# Patient Record
Sex: Female | Born: 1985
Health system: Southern US, Community
[De-identification: ages and names within clinical notes are randomized; demographics above are authoritative.]

## PROBLEM LIST (undated history)

## (undated) DIAGNOSIS — R079 Chest pain, unspecified: Secondary | ICD-10-CM

## (undated) DIAGNOSIS — B1920 Unspecified viral hepatitis C without hepatic coma: Secondary | ICD-10-CM

## (undated) DIAGNOSIS — F32A Depression, unspecified: Secondary | ICD-10-CM

## (undated) DIAGNOSIS — F419 Anxiety disorder, unspecified: Secondary | ICD-10-CM

## (undated) DIAGNOSIS — D649 Anemia, unspecified: Secondary | ICD-10-CM

## (undated) DIAGNOSIS — F329 Major depressive disorder, single episode, unspecified: Secondary | ICD-10-CM

## (undated) DIAGNOSIS — I499 Cardiac arrhythmia, unspecified: Secondary | ICD-10-CM

## (undated) DIAGNOSIS — K8020 Calculus of gallbladder without cholecystitis without obstruction: Secondary | ICD-10-CM

## (undated) DIAGNOSIS — I10 Essential (primary) hypertension: Secondary | ICD-10-CM

## (undated) HISTORY — PX: OTHER SURGICAL HISTORY: SHX169

## (undated) HISTORY — PX: TUBAL LIGATION: SHX77

---

## 2015-10-26 ENCOUNTER — Emergency Department (HOSPITAL_BASED_OUTPATIENT_CLINIC_OR_DEPARTMENT_OTHER)
Admission: EM | Admit: 2015-10-26 | Discharge: 2015-10-26 | Disposition: A | Discharge status: Home / Self Care | Payer: PRIVATE HEALTH INSURANCE | Attending: Emergency Medicine | Admitting: Emergency Medicine

## 2015-10-26 ENCOUNTER — Encounter (HOSPITAL_BASED_OUTPATIENT_CLINIC_OR_DEPARTMENT_OTHER): Payer: Self-pay | Admitting: *Deleted

## 2015-10-26 DIAGNOSIS — I10 Essential (primary) hypertension: Secondary | ICD-10-CM | POA: Insufficient documentation

## 2015-10-26 DIAGNOSIS — F17210 Nicotine dependence, cigarettes, uncomplicated: Secondary | ICD-10-CM | POA: Insufficient documentation

## 2015-10-26 DIAGNOSIS — K529 Noninfective gastroenteritis and colitis, unspecified: Secondary | ICD-10-CM | POA: Diagnosis not present

## 2015-10-26 DIAGNOSIS — E860 Dehydration: Secondary | ICD-10-CM | POA: Insufficient documentation

## 2015-10-26 DIAGNOSIS — N939 Abnormal uterine and vaginal bleeding, unspecified: Secondary | ICD-10-CM | POA: Diagnosis not present

## 2015-10-26 DIAGNOSIS — Z79899 Other long term (current) drug therapy: Secondary | ICD-10-CM | POA: Diagnosis not present

## 2015-10-26 DIAGNOSIS — R112 Nausea with vomiting, unspecified: Secondary | ICD-10-CM | POA: Diagnosis present

## 2015-10-26 DIAGNOSIS — Z8619 Personal history of other infectious and parasitic diseases: Secondary | ICD-10-CM | POA: Insufficient documentation

## 2015-10-26 DIAGNOSIS — Z3202 Encounter for pregnancy test, result negative: Secondary | ICD-10-CM | POA: Diagnosis not present

## 2015-10-26 HISTORY — DX: Chest pain, unspecified: R07.9

## 2015-10-26 HISTORY — DX: Essential (primary) hypertension: I10

## 2015-10-26 HISTORY — DX: Cardiac arrhythmia, unspecified: I49.9

## 2015-10-26 HISTORY — DX: Unspecified viral hepatitis C without hepatic coma: B19.20

## 2015-10-26 LAB — CBC WITH DIFFERENTIAL/PLATELET
Basophils Absolute: 0 10*3/uL (ref 0.0–0.1)
Basophils Relative: 0 %
Eosinophils Absolute: 0.1 10*3/uL (ref 0.0–0.7)
Eosinophils Relative: 2 %
HEMATOCRIT: 34.1 % — AB (ref 36.0–46.0)
HEMOGLOBIN: 10.8 g/dL — AB (ref 12.0–15.0)
LYMPHS ABS: 2.1 10*3/uL (ref 0.7–4.0)
LYMPHS PCT: 26 %
MCH: 26.3 pg (ref 26.0–34.0)
MCHC: 31.7 g/dL (ref 30.0–36.0)
MCV: 83 fL (ref 78.0–100.0)
MONOS PCT: 7 %
Monocytes Absolute: 0.6 10*3/uL (ref 0.1–1.0)
NEUTROS ABS: 5.3 10*3/uL (ref 1.7–7.7)
NEUTROS PCT: 65 %
Platelets: 282 10*3/uL (ref 150–400)
RBC: 4.11 MIL/uL (ref 3.87–5.11)
RDW: 14.6 % (ref 11.5–15.5)
WBC: 8.1 10*3/uL (ref 4.0–10.5)

## 2015-10-26 LAB — URINALYSIS, ROUTINE W REFLEX MICROSCOPIC
Bilirubin Urine: NEGATIVE
GLUCOSE, UA: NEGATIVE mg/dL
KETONES UR: NEGATIVE mg/dL
LEUKOCYTES UA: NEGATIVE
NITRITE: NEGATIVE
PH: 5.5 (ref 5.0–8.0)
Protein, ur: NEGATIVE mg/dL
Specific Gravity, Urine: 1.02 (ref 1.005–1.030)

## 2015-10-26 LAB — COMPREHENSIVE METABOLIC PANEL
ALBUMIN: 3.8 g/dL (ref 3.5–5.0)
ALK PHOS: 46 U/L (ref 38–126)
ALT: 10 U/L — ABNORMAL LOW (ref 14–54)
ANION GAP: 7 (ref 5–15)
AST: 13 U/L — ABNORMAL LOW (ref 15–41)
BUN: 20 mg/dL (ref 6–20)
CO2: 24 mmol/L (ref 22–32)
Calcium: 8.6 mg/dL — ABNORMAL LOW (ref 8.9–10.3)
Chloride: 109 mmol/L (ref 101–111)
Creatinine, Ser: 0.9 mg/dL (ref 0.44–1.00)
GFR calc Af Amer: >60 mL/min (ref >60)
GFR calc non Af Amer: >60 mL/min (ref >60)
GLUCOSE: 94 mg/dL (ref 65–99)
POTASSIUM: 3.7 mmol/L (ref 3.5–5.1)
SODIUM: 140 mmol/L (ref 135–145)
Total Bilirubin: 0.4 mg/dL (ref 0.3–1.2)
Total Protein: 6.8 g/dL (ref 6.5–8.1)

## 2015-10-26 LAB — URINE MICROSCOPIC-ADD ON

## 2015-10-26 LAB — LIPASE, BLOOD: Lipase: 37 U/L (ref 11–51)

## 2015-10-26 LAB — PREGNANCY, URINE: Preg Test, Ur: NEGATIVE

## 2015-10-26 MED ORDER — SODIUM CHLORIDE 0.9 % IV BOLUS (SEPSIS)
1000.0000 mL | Freq: Once | INTRAVENOUS | Status: AC
  Administered 2015-10-26: 1000 mL via INTRAVENOUS

## 2015-10-26 MED ORDER — PROMETHAZINE HCL 25 MG PO TABS: 25.0000 mg | ORAL_TABLET | Freq: Four times a day (QID) | ORAL | Status: DC | PRN

## 2015-10-26 MED ORDER — SODIUM CHLORIDE 0.9 % IV SOLN: INTRAVENOUS | Status: DC

## 2015-10-26 MED ORDER — LOPERAMIDE HCL 2 MG PO TABS: 2.0000 mg | ORAL_TABLET | Freq: Four times a day (QID) | ORAL | Status: DC | PRN

## 2015-10-26 NOTE — Discharge Instructions (Signed)
Take the Imodium right ear for the diarrhea. Take Phenergan as needed for nausea and vomiting return if not improving in one to 2 days. Return for any new or worse symptoms.

## 2015-10-26 NOTE — ED Notes (Signed)
Patient and sister, states patient passed out 3 times this morning.  States she has a 24 hour history of nausea, vomiting and diarrhea.

## 2015-10-26 NOTE — ED Notes (Addendum)
Hypertension. States she ran out of medication 3 weeks ago. Has not been taking her BP but assumed it was elevated due to dizziness. Her friend states she must be dehydrated because she has been having fainting spells all day at last a few seconds. Pt appears anxious. She is ambulatory and laughing at times with her friend.

## 2015-10-26 NOTE — ED Provider Notes (Signed)
CSN: [id number]     Arrival date & time 10/26/15  1121 History   First MD Initiated Contact with Patient 10/26/15 1138     Chief Complaint  Patient presents with  . Hypertension     (Consider location/radiation/quality/duration/timing/severity/associated sxs/prior Treatment) Patient is a 30 y.o. female presenting with hypertension. The history is provided by the patient.  Hypertension Pertinent negatives include no chest pain, no abdominal pain, no headaches and no shortness of breath.  Patient recently moved here from [place]. Patient states that she's been having nausea and diarrhea rare vomiting several near syncopal episodes starting yesterday. Patient's had 8-12 watery bowel movements no blood in them. In addition patient states that her high blood pressure medicine for 3 weeks. Patient denies fever. Patient feels as if she is dehydrated.  Past Medical History  Diagnosis Date  . Hypertension   . Hepatitis C   . Irregular heart beat   . Chest pain    Past Surgical History  Procedure Laterality Date  . C sections    . Tubal ligation     No family history on file. Social History  Substance Use Topics  . Smoking status: Current Every Day Smoker -- 1.00 packs/day    Types: Cigarettes  . Smokeless tobacco: None  . Alcohol Use: No   OB History    No data available     Review of Systems  Constitutional: Negative for fever.  HENT: Negative for congestion.   Eyes: Negative for redness.  Respiratory: Negative for shortness of breath.   Cardiovascular: Negative for chest pain.  Gastrointestinal: Positive for nausea and diarrhea. Negative for vomiting and abdominal pain.  Genitourinary: Positive for vaginal bleeding. Negative for dysuria.  Musculoskeletal: Negative for back pain.  Skin: Negative for rash.  Neurological: Negative for headaches.  Hematological: Does not bruise/bleed easily.  Psychiatric/Behavioral: Negative for confusion.      Allergies  Review of  patient's allergies indicates no known allergies.  Home Medications   Prior to Admission medications   Medication Sig Start Date End Date Taking? Authorizing Provider  AMLODIPINE BESYLATE PO Take 5 mg by mouth 1 day or 1 dose.   Yes Historical Provider, MD  busPIRone (BUSPAR) 5 MG tablet Take 5 mg by mouth 3 (three) times daily.   Yes Historical Provider, MD  lisinopril (PRINIVIL,ZESTRIL) 40 MG tablet Take 40 mg by mouth daily.   Yes Historical Provider, MD  metoprolol (LOPRESSOR) 50 MG tablet Take 50 mg by mouth 2 (two) times daily.   Yes Historical Provider, MD  sertraline (ZOLOFT) 100 MG tablet Take 100 mg by mouth daily.   Yes Historical Provider, MD  loperamide (IMODIUM A-D) 2 MG tablet Take 1 tablet (2 mg total) by mouth 4 (four) times daily as needed for diarrhea or loose stools. 10/26/15   Vanetta Mulders, MD  promethazine (PHENERGAN) 25 MG tablet Take 1 tablet (25 mg total) by mouth every 6 (six) hours as needed for nausea or vomiting. 10/26/15   Vanetta Mulders, MD   BP 125/78 mmHg  Pulse 84  Temp(Src) 98.3 F (36.8 C) (Oral)  Resp 16  Ht 5' 1.5" (1.562 m)  Wt 96.163 kg  BMI 39.41 kg/m2  SpO2 99%  LMP 10/25/2015 Physical Exam  Constitutional: She is oriented to person, place, and time. She appears well-developed and well-nourished. No distress.  HENT:  Head: Normocephalic and atraumatic.  Membranes dry  Eyes: Conjunctivae and EOM are normal. Pupils are equal, round, and reactive to light.  Neck: Normal  range of motion. Neck supple.  Cardiovascular: Normal rate, regular rhythm and normal heart sounds.   No murmur heard. Pulmonary/Chest: Effort normal and breath sounds normal. No respiratory distress.  Abdominal: Soft. Bowel sounds are normal. There is no tenderness.  Musculoskeletal: Normal range of motion. She exhibits no edema.  Neurological: She is alert and oriented to person, place, and time. No cranial nerve deficit. She exhibits normal muscle tone. Coordination  normal.  Skin: Skin is warm. No rash noted.  Nursing note and vitals reviewed.   ED Course  Procedures (including critical care time) Labs Review Labs Reviewed  COMPREHENSIVE METABOLIC PANEL - Abnormal; Notable for the following:    Calcium 8.6 (*)    AST 13 (*)    ALT 10 (*)    All other components within normal limits  CBC WITH DIFFERENTIAL/PLATELET - Abnormal; Notable for the following:    Hemoglobin 10.8 (*)    HCT 34.1 (*)    All other components within normal limits  URINALYSIS, ROUTINE W REFLEX MICROSCOPIC (NOT AT [facility]) - Abnormal; Notable for the following:    Hgb urine dipstick LARGE (*)    All other components within normal limits  URINE MICROSCOPIC-ADD ON - Abnormal; Notable for the following:    Squamous Epithelial / LPF 0-5 (*)    Bacteria, UA RARE (*)    All other components within normal limits  URINE CULTURE  LIPASE, BLOOD  PREGNANCY, URINE   Results for orders placed or performed during the hospital encounter of 10/26/15  Comprehensive metabolic panel  Result Value Ref Range   Sodium 140 135 - 145 mmol/L   Potassium 3.7 3.5 - 5.1 mmol/L   Chloride 109 101 - 111 mmol/L   CO2 24 22 - 32 mmol/L   Glucose, Bld 94 65 - 99 mg/dL   BUN 20 6 - 20 mg/dL   Creatinine, Ser 1.610.90 0.44 - 1.00 mg/dL   Calcium 8.6 (L) 8.9 - 10.3 mg/dL   Total Protein 6.8 6.5 - 8.1 g/dL   Albumin 3.8 3.5 - 5.0 g/dL   AST 13 (L) 15 - 41 U/L   ALT 10 (L) 14 - 54 U/L   Alkaline Phosphatase 46 38 - 126 U/L   Total Bilirubin 0.4 0.3 - 1.2 mg/dL   GFR calc non Af Amer >60 >60 mL/min   GFR calc Af Amer >60 >60 mL/min   Anion gap 7 5 - 15  Lipase, blood  Result Value Ref Range   Lipase 37 11 - 51 U/L  CBC with Differential/Platelet  Result Value Ref Range   WBC 8.1 4.0 - 10.5 K/uL   RBC 4.11 3.87 - 5.11 MIL/uL   Hemoglobin 10.8 (L) 12.0 - 15.0 g/dL   HCT 09.634.1 (L) 04.536.0 - 40.946.0 %   MCV 83.0 78.0 - 100.0 fL   MCH 26.3 26.0 - 34.0 pg   MCHC 31.7 30.0 - 36.0 g/dL   RDW 81.114.6 91.411.5 - 78.215.5  %   Platelets 282 150 - 400 K/uL   Neutrophils Relative % 65 %   Neutro Abs 5.3 1.7 - 7.7 K/uL   Lymphocytes Relative 26 %   Lymphs Abs 2.1 0.7 - 4.0 K/uL   Monocytes Relative 7 %   Monocytes Absolute 0.6 0.1 - 1.0 K/uL   Eosinophils Relative 2 %   Eosinophils Absolute 0.1 0.0 - 0.7 K/uL   Basophils Relative 0 %   Basophils Absolute 0.0 0.0 - 0.1 K/uL  Urinalysis, Routine w reflex microscopic (not  at [facility])  Result Value Ref Range   Color, Urine YELLOW YELLOW   APPearance CLEAR CLEAR   Specific Gravity, Urine 1.020 1.005 - 1.030   pH 5.5 5.0 - 8.0   Glucose, UA NEGATIVE NEGATIVE mg/dL   Hgb urine dipstick LARGE (A) NEGATIVE   Bilirubin Urine NEGATIVE NEGATIVE   Ketones, ur NEGATIVE NEGATIVE mg/dL   Protein, ur NEGATIVE NEGATIVE mg/dL   Nitrite NEGATIVE NEGATIVE   Leukocytes, UA NEGATIVE NEGATIVE  Pregnancy, urine  Result Value Ref Range   Preg Test, Ur NEGATIVE NEGATIVE  Urine microscopic-add on  Result Value Ref Range   Squamous Epithelial / LPF 0-5 (A) NONE SEEN   WBC, UA 0-5 0 - 5 WBC/hpf   RBC / HPF TOO NUMEROUS TO COUNT 0 - 5 RBC/hpf   Bacteria, UA RARE (A) NONE SEEN     Imaging Review No results found. I have personally reviewed and evaluated these images and lab results as part of my medical decision-making.   EKG Interpretation   Date/Time:  Friday October 26 2015 11:46:51 EST Ventricular Rate:  85 PR Interval:  116 QRS Duration: 98 QT Interval:  364 QTC Calculation: 433 R Axis:   47 Text Interpretation:  Normal sinus rhythm Normal ECG Confirmed by  Remo Kirschenmann  MD, Kimon Loewen ([id number]) on 10/26/2015 12:07:35 PM      MDM   Final diagnoses:  Gastroenteritis  Dehydration    The patient's labs without significant abnormalities. There is a little bit of mild elevation in some of the liver function tests patient Reginia Naas has a history of hepatitis C nothing is consistent with acute hepatitis. Mild anemia with hemoglobin 10.8. Otherwise test are negative. Patient with  the RBCs in the urine the patient on her menstrual cycle. No evidence of any bacteria or white blood cells in the urine. Will treat as a gastroenteritis treat nausea with Phenergan and Imodium right ear. Patient nontoxic no acute distress. Patient feels better with the IV hydration. Patient the received 2 L of fluid.  In addition for patients blood pressure blood pressures are well controlled here with systolics around 125 normal diastolics. Patient does not need blood pressure medication at this time based on these numbers. Information provided to help her get a primary care doctor locally for follow-up.  Vanetta Mulders, MD 10/26/15 1527

## 2015-10-28 LAB — URINE CULTURE

## 2016-10-12 ENCOUNTER — Emergency Department (HOSPITAL_COMMUNITY)
Admission: EM | Admit: 2016-10-12 | Discharge: 2016-10-12 | Disposition: A | Discharge status: Home / Self Care | Payer: Medicaid - Out of State | Attending: Emergency Medicine | Admitting: Emergency Medicine

## 2016-10-12 ENCOUNTER — Emergency Department (HOSPITAL_COMMUNITY): Payer: Medicaid - Out of State

## 2016-10-12 ENCOUNTER — Encounter (HOSPITAL_COMMUNITY): Payer: Self-pay

## 2016-10-12 DIAGNOSIS — R1013 Epigastric pain: Secondary | ICD-10-CM | POA: Insufficient documentation

## 2016-10-12 DIAGNOSIS — F17210 Nicotine dependence, cigarettes, uncomplicated: Secondary | ICD-10-CM | POA: Diagnosis not present

## 2016-10-12 DIAGNOSIS — I10 Essential (primary) hypertension: Secondary | ICD-10-CM | POA: Insufficient documentation

## 2016-10-12 DIAGNOSIS — Z79899 Other long term (current) drug therapy: Secondary | ICD-10-CM | POA: Insufficient documentation

## 2016-10-12 DIAGNOSIS — R1010 Upper abdominal pain, unspecified: Secondary | ICD-10-CM | POA: Diagnosis not present

## 2016-10-12 LAB — ETHANOL: Alcohol, Ethyl (B): <5 mg/dL

## 2016-10-12 LAB — I-STAT BETA HCG BLOOD, ED (MC, WL, AP ONLY): I-stat hCG, quantitative: <5 m[IU]/mL

## 2016-10-12 LAB — COMPREHENSIVE METABOLIC PANEL WITH GFR
ALT: 17 U/L (ref 14–54)
AST: 20 U/L (ref 15–41)
Albumin: 4.2 g/dL (ref 3.5–5.0)
Alkaline Phosphatase: 50 U/L (ref 38–126)
Anion gap: 7 (ref 5–15)
BUN: 16 mg/dL (ref 6–20)
CO2: 24 mmol/L (ref 22–32)
Calcium: 9.2 mg/dL (ref 8.9–10.3)
Chloride: 109 mmol/L (ref 101–111)
Creatinine, Ser: 1.05 mg/dL — ABNORMAL HIGH (ref 0.44–1.00)
GFR calc Af Amer: >60 mL/min
GFR calc non Af Amer: >60 mL/min
Glucose, Bld: 89 mg/dL (ref 65–99)
Potassium: 4.1 mmol/L (ref 3.5–5.1)
Sodium: 140 mmol/L (ref 135–145)
Total Bilirubin: 0.8 mg/dL (ref 0.3–1.2)
Total Protein: 7.4 g/dL (ref 6.5–8.1)

## 2016-10-12 LAB — CBC
HCT: 38.4 % (ref 36.0–46.0)
Hemoglobin: 12.5 g/dL (ref 12.0–15.0)
MCH: 27 pg (ref 26.0–34.0)
MCHC: 32.6 g/dL (ref 30.0–36.0)
MCV: 82.9 fL (ref 78.0–100.0)
Platelets: 265 10*3/uL (ref 150–400)
RBC: 4.63 MIL/uL (ref 3.87–5.11)
RDW: 15.8 % — ABNORMAL HIGH (ref 11.5–15.5)
WBC: 6.7 10*3/uL (ref 4.0–10.5)

## 2016-10-12 LAB — URINALYSIS, ROUTINE W REFLEX MICROSCOPIC
Bilirubin Urine: NEGATIVE
GLUCOSE, UA: NEGATIVE mg/dL
Hgb urine dipstick: NEGATIVE
KETONES UR: NEGATIVE mg/dL
Leukocytes, UA: NEGATIVE
Nitrite: NEGATIVE
PROTEIN: NEGATIVE mg/dL
Specific Gravity, Urine: 1.029 (ref 1.005–1.030)
pH: 5 (ref 5.0–8.0)

## 2016-10-12 LAB — LIPASE, BLOOD: Lipase: 39 U/L (ref 11–51)

## 2016-10-12 MED ORDER — OMEPRAZOLE 20 MG PO CPDR: 20.0000 mg | DELAYED_RELEASE_CAPSULE | Freq: Every day | ORAL | 0 refills | Status: DC

## 2016-10-12 MED ORDER — PROMETHAZINE HCL 25 MG PO TABS: 25.0000 mg | ORAL_TABLET | Freq: Four times a day (QID) | ORAL | 0 refills | Status: DC | PRN

## 2016-10-12 MED ORDER — PANTOPRAZOLE SODIUM 40 MG IV SOLR
40.0000 mg | Freq: Once | INTRAVENOUS | Status: AC
  Administered 2016-10-12: 40 mg via INTRAVENOUS
  Filled 2016-10-12: qty 40

## 2016-10-12 MED ORDER — CHLORDIAZEPOXIDE HCL 25 MG PO CAPS: ORAL_CAPSULE | ORAL | 0 refills | Status: DC

## 2016-10-12 MED ORDER — SODIUM CHLORIDE 0.9 % IV SOLN
1000.0000 mL | INTRAVENOUS | Status: DC
  Administered 2016-10-12: 1000 mL via INTRAVENOUS

## 2016-10-12 MED ORDER — SODIUM CHLORIDE 0.9 % IV SOLN
1000.0000 mL | Freq: Once | INTRAVENOUS | Status: AC
  Administered 2016-10-12: 1000 mL via INTRAVENOUS

## 2016-10-12 MED ORDER — ONDANSETRON HCL 4 MG/2ML IJ SOLN
4.0000 mg | Freq: Once | INTRAMUSCULAR | Status: AC
  Administered 2016-10-12: 4 mg via INTRAVENOUS
  Filled 2016-10-12: qty 2

## 2016-10-12 MED ORDER — MORPHINE SULFATE (PF) 4 MG/ML IV SOLN
6.0000 mg | Freq: Once | INTRAVENOUS | Status: AC
  Administered 2016-10-12: 6 mg via INTRAVENOUS
  Filled 2016-10-12: qty 2

## 2016-10-12 NOTE — ED Provider Notes (Signed)
[facility]-EMERGENCY DEPT Provider Note   CSN: [id number] Arrival date & time: 10/12/16  1210     History   Chief Complaint Chief Complaint  Patient presents with  . Abdominal Pain  . Emesis    HPI Shirley Murphy is a 31 y.o. female.  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  She has a history of hepatitis C.  She presents to the emergency department complaining of intermittent epigastric and right upper quadrant pain over the past week.  It waxes and wanes.  She feels like it worsens with food.  She continues to drink alcohol.  No prior history of pancreatitis.  No fevers or chills.  No hematemesis.  No melena or hematochezia.  No diarrhea.  Pain is moderate in severity.   Past Medical History:  Diagnosis Date  . Chest pain   . Hepatitis C   . Hypertension   . Irregular heart beat     There are no active problems to display for this patient.   Past Surgical History:  Procedure Laterality Date  . c sections    . TUBAL LIGATION      OB History    No data available       Home Medications    Prior to Admission medications   Medication Sig Start Date End Date Taking? Authorizing Provider  AMLODIPINE BESYLATE PO Take 5 mg by mouth 1 day or 1 dose.    Historical Provider, MD  busPIRone (BUSPAR) 5 MG tablet Take 5 mg by mouth 3 (three) times daily.    Historical Provider, MD  lisinopril (PRINIVIL,ZESTRIL) 40 MG tablet Take 40 mg by mouth daily.    Historical Provider, MD  loperamide (IMODIUM A-D) 2 MG tablet Take 1 tablet (2 mg total) by mouth 4 (four) times daily as needed for diarrhea or loose stools. 10/26/15   Vanetta MuldersScott Zackowski, MD  metoprolol (LOPRESSOR) 50 MG tablet Take 50 mg by mouth 2 (two) times daily.    Historical Provider, MD  promethazine (PHENERGAN) 25 MG tablet Take 1 tablet (25 mg total) by mouth every 6 (six) hours as needed for nausea or vomiting. 10/26/15   Vanetta MuldersScott Zackowski, MD  sertraline (ZOLOFT) 100 MG tablet Take 100 mg by mouth  daily.    Historical Provider, MD    Family History History reviewed. No pertinent family history.  Social History Social History  Substance Use Topics  . Smoking status: Current Every Day Smoker    Packs/day: 1.00    Types: Cigarettes  . Smokeless tobacco: Never Used  . Alcohol use No     Allergies   Patient has no known allergies.   Review of Systems Review of Systems  All other systems reviewed and are negative.    Physical Exam Updated Vital Signs BP (!) 151/102   Pulse 82   Temp 98.1 F (36.7 C) (Oral)   Resp 16   Ht 5' 1.5" (1.562 m)   Wt 235 lb 3 oz (106.7 kg)   LMP 10/07/2016   SpO2 99%   BMI 43.72 kg/m   Physical Exam  Constitutional: She is oriented to person, place, and time. She appears well-developed and well-nourished. No distress.  HENT:  Head: Normocephalic and atraumatic.  Eyes: EOM are normal.  Neck: Normal range of motion.  Cardiovascular: Normal rate, regular rhythm and normal heart sounds.   Pulmonary/Chest: Effort normal and breath sounds normal.  Abdominal: Soft. She exhibits no distension. There is no tenderness.  Musculoskeletal: Normal range  of motion.  Neurological: She is alert and oriented to person, place, and time.  Skin: Skin is warm and dry.  Psychiatric: She has a normal mood and affect. Judgment normal.  Nursing note and vitals reviewed.    ED Treatments / Results  Labs (all labs ordered are listed, but only abnormal results are displayed) Labs Reviewed  COMPREHENSIVE METABOLIC PANEL - Abnormal; Notable for the following:       Result Value   Creatinine, Ser 1.05 (*)    All other components within normal limits  CBC - Abnormal; Notable for the following:    RDW 15.8 (*)    All other components within normal limits  LIPASE, BLOOD  URINALYSIS, ROUTINE W REFLEX MICROSCOPIC  ETHANOL  I-STAT BETA HCG BLOOD, ED ([facility], [facility], [facility] ONLY)    EKG  EKG Interpretation None       Radiology US Abdomen Limited  Ruq  Result Date: 10/12/2016 CLINICAL DATA:  Right upper quadrant pain for 5 days. EXAM: US ABDOMEN LIMITED - RIGHT UPPER QUADRANT COMPARISON:  None. FINDINGS: Gallbladder: No gallstones or wall thickening visualized. No sonographic Murphy sign noted by sonographer. Common bile duct: Diameter: 2.6 mm Liver: No focal lesion identified. Within normal limits in parenchymal echogenicity. IMPRESSION: Normal right upper quadrant ultrasound. Electronically Signed   By: Amie Portland M.D.   On: 10/12/2016 16:26    Procedures Procedures (including critical care time)  Medications Ordered in ED Medications  0.9 %  sodium chloride infusion (1,000 mLs Intravenous New Bag/Given 10/12/16 1628)    Followed by  0.9 %  sodium chloride infusion (1,000 mLs Intravenous New Bag/Given 10/12/16 1632)  pantoprazole (PROTONIX) injection 40 mg (not administered)  morphine 4 MG/ML injection 6 mg (6 mg Intravenous Given 10/12/16 1628)  ondansetron (ZOFRAN) injection 4 mg (4 mg Intravenous Given 10/12/16 1628)     Initial Impression / Assessment and Plan / ED Course  I have reviewed the triage vital signs and the nursing notes.  Pertinent labs & imaging results that were available during my care of the patient were reviewed by me and considered in my medical decision making (see chart for details).     7:13 PM Patient's pain is improving.  No indication for CT imaging at this time.  Ultrasound negative.  This likely represents gastritis associated from ongoing alcohol use.  Recommend abstaining from alcohol use.  Outpatient GI follow-up.  Outpatient primary care follow-up.  Home with Phenergan and Prilosec.  Home with a Librium taper to help her with her alcohol abuse issue.  She is focused on quitting alcohol.  Outpatient resources given.  She understands to return to the ER for new or worsening symptoms.  No fever.  Vitals stable.  Labs without significant abnormality  Final Clinical Impressions(s) / ED Diagnoses    Final diagnoses:  Upper abdominal pain    New Prescriptions New Prescriptions   No medications on file     Azalia Bilis, MD 10/12/16 [phone]

## 2016-10-12 NOTE — ED Triage Notes (Signed)
Pt with abdominal pain, emesis, flank pain x 1 week.  No difficulty urinating.  Does not recall fever.

## 2016-10-12 NOTE — ED Notes (Signed)
Pt made aware of need for urine specimen 

## 2016-10-15 ENCOUNTER — Encounter (HOSPITAL_COMMUNITY): Payer: Self-pay | Admitting: *Deleted

## 2016-10-15 ENCOUNTER — Observation Stay (HOSPITAL_COMMUNITY)
Admission: EM | Admit: 2016-10-15 | Discharge: 2016-10-17 | Disposition: A | Discharge status: Other Acute Inpatient Hospital | Payer: Medicaid - Out of State | Attending: Internal Medicine | Admitting: Internal Medicine

## 2016-10-15 DIAGNOSIS — F1094 Alcohol use, unspecified with alcohol-induced mood disorder: Secondary | ICD-10-CM | POA: Diagnosis not present

## 2016-10-15 DIAGNOSIS — Z791 Long term (current) use of non-steroidal anti-inflammatories (NSAID): Secondary | ICD-10-CM | POA: Insufficient documentation

## 2016-10-15 DIAGNOSIS — R45851 Suicidal ideations: Secondary | ICD-10-CM

## 2016-10-15 DIAGNOSIS — R1013 Epigastric pain: Secondary | ICD-10-CM | POA: Insufficient documentation

## 2016-10-15 DIAGNOSIS — F1410 Cocaine abuse, uncomplicated: Secondary | ICD-10-CM | POA: Diagnosis present

## 2016-10-15 DIAGNOSIS — F10220 Alcohol dependence with intoxication, uncomplicated: Secondary | ICD-10-CM | POA: Diagnosis not present

## 2016-10-15 DIAGNOSIS — Y907 Blood alcohol level of 200-239 mg/100 ml: Secondary | ICD-10-CM | POA: Insufficient documentation

## 2016-10-15 DIAGNOSIS — F14120 Cocaine abuse with intoxication, uncomplicated: Secondary | ICD-10-CM | POA: Insufficient documentation

## 2016-10-15 DIAGNOSIS — T1491XA Suicide attempt, initial encounter: Secondary | ICD-10-CM | POA: Diagnosis not present

## 2016-10-15 DIAGNOSIS — I10 Essential (primary) hypertension: Secondary | ICD-10-CM

## 2016-10-15 DIAGNOSIS — F1414 Cocaine abuse with cocaine-induced mood disorder: Secondary | ICD-10-CM | POA: Diagnosis not present

## 2016-10-15 DIAGNOSIS — T510X2A Toxic effect of ethanol, intentional self-harm, initial encounter: Secondary | ICD-10-CM | POA: Insufficient documentation

## 2016-10-15 DIAGNOSIS — Z8619 Personal history of other infectious and parasitic diseases: Secondary | ICD-10-CM | POA: Diagnosis not present

## 2016-10-15 DIAGNOSIS — T461X2A Poisoning by calcium-channel blockers, intentional self-harm, initial encounter: Secondary | ICD-10-CM | POA: Diagnosis not present

## 2016-10-15 DIAGNOSIS — T405X2A Poisoning by cocaine, intentional self-harm, initial encounter: Secondary | ICD-10-CM | POA: Diagnosis not present

## 2016-10-15 DIAGNOSIS — F419 Anxiety disorder, unspecified: Secondary | ICD-10-CM | POA: Diagnosis not present

## 2016-10-15 DIAGNOSIS — T450X2A Poisoning by antiallergic and antiemetic drugs, intentional self-harm, initial encounter: Secondary | ICD-10-CM

## 2016-10-15 DIAGNOSIS — F17210 Nicotine dependence, cigarettes, uncomplicated: Secondary | ICD-10-CM | POA: Diagnosis not present

## 2016-10-15 DIAGNOSIS — F329 Major depressive disorder, single episode, unspecified: Secondary | ICD-10-CM | POA: Insufficient documentation

## 2016-10-15 DIAGNOSIS — F10920 Alcohol use, unspecified with intoxication, uncomplicated: Secondary | ICD-10-CM | POA: Diagnosis present

## 2016-10-15 DIAGNOSIS — F1994 Other psychoactive substance use, unspecified with psychoactive substance-induced mood disorder: Secondary | ICD-10-CM | POA: Diagnosis not present

## 2016-10-15 DIAGNOSIS — T5192XA Toxic effect of unspecified alcohol, intentional self-harm, initial encounter: Secondary | ICD-10-CM

## 2016-10-15 DIAGNOSIS — Z79899 Other long term (current) drug therapy: Secondary | ICD-10-CM

## 2016-10-15 DIAGNOSIS — I499 Cardiac arrhythmia, unspecified: Secondary | ICD-10-CM | POA: Diagnosis not present

## 2016-10-15 LAB — CBC
HEMATOCRIT: 36.6 % (ref 36.0–46.0)
HEMOGLOBIN: 12 g/dL (ref 12.0–15.0)
MCH: 26.7 pg (ref 26.0–34.0)
MCHC: 32.8 g/dL (ref 30.0–36.0)
MCV: 81.5 fL (ref 78.0–100.0)
Platelets: 274 10*3/uL (ref 150–400)
RBC: 4.49 MIL/uL (ref 3.87–5.11)
RDW: 16 % — ABNORMAL HIGH (ref 11.5–15.5)
WBC: 6.4 10*3/uL (ref 4.0–10.5)

## 2016-10-15 LAB — GLUCOSE, CAPILLARY: Glucose-Capillary: 92 mg/dL (ref 65–99)

## 2016-10-15 LAB — COMPREHENSIVE METABOLIC PANEL
ALBUMIN: 4.1 g/dL (ref 3.5–5.0)
ALK PHOS: 50 U/L (ref 38–126)
ALK PHOS: 55 U/L (ref 38–126)
ALT: 16 U/L (ref 14–54)
ALT: 17 U/L (ref 14–54)
ANION GAP: 8 (ref 5–15)
AST: 19 U/L (ref 15–41)
AST: 19 U/L (ref 15–41)
Albumin: 4.2 g/dL (ref 3.5–5.0)
Anion gap: 9 (ref 5–15)
BILIRUBIN TOTAL: 0.6 mg/dL (ref 0.3–1.2)
BILIRUBIN TOTAL: 0.5 mg/dL (ref 0.3–1.2)
BUN: 16 mg/dL (ref 6–20)
BUN: 14 mg/dL (ref 6–20)
CALCIUM: 9.1 mg/dL (ref 8.9–10.3)
CALCIUM: 9.7 mg/dL (ref 8.9–10.3)
CO2: 22 mmol/L (ref 22–32)
CO2: 22 mmol/L (ref 22–32)
CREATININE: 0.88 mg/dL (ref 0.44–1.00)
Chloride: 111 mmol/L (ref 101–111)
Chloride: 107 mmol/L (ref 101–111)
Creatinine, Ser: 0.85 mg/dL (ref 0.44–1.00)
GFR calc non Af Amer: >60 mL/min (ref >60)
GFR calc non Af Amer: >60 mL/min (ref >60)
GLUCOSE: 79 mg/dL (ref 65–99)
Glucose, Bld: 80 mg/dL (ref 65–99)
POTASSIUM: 4.2 mmol/L (ref 3.5–5.1)
Potassium: 3.7 mmol/L (ref 3.5–5.1)
SODIUM: 142 mmol/L (ref 135–145)
SODIUM: 137 mmol/L (ref 135–145)
TOTAL PROTEIN: 7.5 g/dL (ref 6.5–8.1)
Total Protein: 7.3 g/dL (ref 6.5–8.1)

## 2016-10-15 LAB — RAPID URINE DRUG SCREEN, HOSP PERFORMED
Amphetamines: NOT DETECTED
BARBITURATES: NOT DETECTED
Benzodiazepines: NOT DETECTED
Cocaine: POSITIVE — AB
OPIATES: NOT DETECTED
TETRAHYDROCANNABINOL: NOT DETECTED

## 2016-10-15 LAB — SALICYLATE LEVEL

## 2016-10-15 LAB — ACETAMINOPHEN LEVEL: Acetaminophen (Tylenol), Serum: <10 ug/mL — ABNORMAL LOW (ref 10–30)

## 2016-10-15 LAB — LIPASE, BLOOD: Lipase: 23 U/L (ref 11–51)

## 2016-10-15 LAB — TROPONIN I: Troponin I: <0.03 ng/mL (ref <0.03)

## 2016-10-15 LAB — MAGNESIUM: Magnesium: 2.3 mg/dL (ref 1.7–2.4)

## 2016-10-15 LAB — IRON AND TIBC
Iron: 14 ug/dL — ABNORMAL LOW (ref 28–170)
SATURATION RATIOS: 3 % — AB (ref 10.4–31.8)
TIBC: 498 ug/dL — ABNORMAL HIGH (ref 250–450)
UIBC: 484 ug/dL

## 2016-10-15 LAB — ETHANOL: Alcohol, Ethyl (B): 236 mg/dL — ABNORMAL HIGH (ref <5)

## 2016-10-15 MED ORDER — ONDANSETRON HCL 4 MG PO TABS: 4.0000 mg | ORAL_TABLET | Freq: Four times a day (QID) | ORAL | Status: DC | PRN

## 2016-10-15 MED ORDER — INFLUENZA VAC SPLIT QUAD 0.5 ML IM SUSY
0.5000 mL | PREFILLED_SYRINGE | INTRAMUSCULAR | Status: AC
  Administered 2016-10-16: 0.5 mL via INTRAMUSCULAR
  Filled 2016-10-15: qty 0.5

## 2016-10-15 MED ORDER — ACETAMINOPHEN 650 MG RE SUPP: 650.0000 mg | Freq: Four times a day (QID) | RECTAL | Status: DC | PRN

## 2016-10-15 MED ORDER — LORAZEPAM 1 MG PO TABS: 1.0000 mg | ORAL_TABLET | Freq: Four times a day (QID) | ORAL | Status: DC | PRN

## 2016-10-15 MED ORDER — ACETAMINOPHEN 325 MG PO TABS
650.0000 mg | ORAL_TABLET | Freq: Four times a day (QID) | ORAL | Status: DC | PRN
  Administered 2016-10-15: 650 mg via ORAL
  Filled 2016-10-15: qty 2

## 2016-10-15 MED ORDER — ADULT MULTIVITAMIN W/MINERALS CH
1.0000 | ORAL_TABLET | Freq: Every day | ORAL | Status: DC
  Administered 2016-10-15 – 2016-10-17 (×3): 1 via ORAL
  Filled 2016-10-15 (×3): qty 1

## 2016-10-15 MED ORDER — NICOTINE 21 MG/24HR TD PT24
21.0000 mg | MEDICATED_PATCH | Freq: Once | TRANSDERMAL | Status: AC
  Administered 2016-10-15: 21 mg via TRANSDERMAL
  Filled 2016-10-15: qty 1

## 2016-10-15 MED ORDER — ONDANSETRON HCL 4 MG/2ML IJ SOLN
4.0000 mg | Freq: Four times a day (QID) | INTRAMUSCULAR | Status: DC | PRN
  Administered 2016-10-16 (×2): 4 mg via INTRAVENOUS
  Filled 2016-10-15 (×2): qty 2

## 2016-10-15 MED ORDER — THIAMINE HCL 100 MG/ML IJ SOLN: 100.0000 mg | Freq: Every day | INTRAMUSCULAR | Status: DC

## 2016-10-15 MED ORDER — FOLIC ACID 1 MG PO TABS
1.0000 mg | ORAL_TABLET | Freq: Every day | ORAL | Status: DC
  Administered 2016-10-15 – 2016-10-17 (×3): 1 mg via ORAL
  Filled 2016-10-15 (×3): qty 1

## 2016-10-15 MED ORDER — VITAMIN B-1 100 MG PO TABS
100.0000 mg | ORAL_TABLET | Freq: Every day | ORAL | Status: DC
  Administered 2016-10-15 – 2016-10-17 (×3): 100 mg via ORAL
  Filled 2016-10-15 (×3): qty 1

## 2016-10-15 MED ORDER — ENOXAPARIN SODIUM 40 MG/0.4ML ~~LOC~~ SOLN
40.0000 mg | SUBCUTANEOUS | Status: DC
  Administered 2016-10-15: 40 mg via SUBCUTANEOUS
  Filled 2016-10-15 (×2): qty 0.4

## 2016-10-15 MED ORDER — IBUPROFEN 200 MG PO TABS
400.0000 mg | ORAL_TABLET | Freq: Four times a day (QID) | ORAL | Status: AC | PRN
  Administered 2016-10-15 – 2016-10-17 (×2): 400 mg via ORAL
  Filled 2016-10-15 (×2): qty 2

## 2016-10-15 MED ORDER — POTASSIUM CHLORIDE CRYS ER 20 MEQ PO TBCR
40.0000 meq | EXTENDED_RELEASE_TABLET | Freq: Once | ORAL | Status: AC
  Administered 2016-10-15: 40 meq via ORAL
  Filled 2016-10-15: qty 2

## 2016-10-15 MED ORDER — DOCUSATE SODIUM 100 MG PO CAPS
200.0000 mg | ORAL_CAPSULE | Freq: Every day | ORAL | Status: DC
  Administered 2016-10-15 – 2016-10-16 (×2): 200 mg via ORAL
  Filled 2016-10-15 (×2): qty 2

## 2016-10-15 MED ORDER — SODIUM CHLORIDE 0.9 % IV SOLN
INTRAVENOUS | Status: DC
  Administered 2016-10-15 – 2016-10-16 (×3): via INTRAVENOUS

## 2016-10-15 MED ORDER — PANTOPRAZOLE SODIUM 40 MG PO TBEC
40.0000 mg | DELAYED_RELEASE_TABLET | Freq: Every day | ORAL | Status: DC
  Administered 2016-10-15 – 2016-10-17 (×3): 40 mg via ORAL
  Filled 2016-10-15 (×3): qty 1

## 2016-10-15 MED ORDER — LORAZEPAM 2 MG/ML IJ SOLN
1.0000 mg | Freq: Four times a day (QID) | INTRAMUSCULAR | Status: DC | PRN
Start: 2016-10-15 — End: 2016-10-17
  Administered 2016-10-15 – 2016-10-17 (×5): 1 mg via INTRAVENOUS
  Filled 2016-10-15 (×5): qty 1

## 2016-10-15 NOTE — ED Notes (Signed)
Patient states she has also been drinking a lot of alcohol

## 2016-10-15 NOTE — Progress Notes (Signed)
Received patient from Dr. Mora Bellmanni. [name] has a 31 year old female with pmh HTN, hep C, and depression; who presents after a suicide attempt by overdose. Patient reportedly drank multiple 40oz of liquor, did some cocaine, and took 8 tablets of unknown mg of amlodipine. [facility]asant control was contacted and recommended monitoring for at least 12 hours due to half-life monitoring for hypotension. Vital signs currently stable. Sitter at bedside. Patient will need to neuro evaluation once medically cleared. Admit to a telemetry bed

## 2016-10-15 NOTE — ED Notes (Signed)
Poison Control faxed over calcium channel blocker overdose protocol/given to Dr. Mora Bellmanni

## 2016-10-15 NOTE — ED Provider Notes (Signed)
[facility]-EMERGENCY DEPT Provider Note   CSN: [id number] Arrival date & time: 10/15/16  0447     History   Chief Complaint Chief Complaint  Patient presents with  . Suicidal  . Overdose Amlodipine    HPI Shirley Murphy is a 31 y.o. female no sig PMH here with suicide attempt.  She states she drank a lot of alcohol and used cocaine last night.  She also took 8 amlodipine tablets of unknown dosage in effort to kill herself.  She has had attempts in the past as a teenager.  She denies HI.  She has no medical complaints. She denies hallucinations. She states noone cares about her or needs her so she wants to die.  No further complaints.  10 Systems reviewed and are negative for acute change except as noted in the HPI.   HPI  Past Medical History:  Diagnosis Date  . Chest pain   . Hepatitis C   . Hypertension   . Irregular heart beat     Patient Active Problem List   Diagnosis Date Noted  . Suicide attempt 10/15/2016    Past Surgical History:  Procedure Laterality Date  . c sections    . TUBAL LIGATION      OB History    No data available       Home Medications    Prior to Admission medications   Medication Sig Start Date End Date Taking? Authorizing Provider  naproxen sodium (ANAPROX) 220 MG tablet Take 440 mg by mouth 2 (two) times daily with a meal.   Yes Historical Provider, MD  chlordiazePOXIDE (LIBRIUM) 25 MG capsule 50mg  PO TID x 1D, then 25-50mg  PO BID X 1D, then 25-50mg  PO QD X 1D Patient not taking: Reported on 10/15/2016 10/12/16   Azalia BilisKevin Campos, MD  omeprazole (PRILOSEC) 20 MG capsule Take 1 capsule (20 mg total) by mouth daily. Patient not taking: Reported on 10/15/2016 10/12/16   Azalia BilisKevin Campos, MD  promethazine (PHENERGAN) 25 MG tablet Take 1 tablet (25 mg total) by mouth every 6 (six) hours as needed for nausea or vomiting. Patient not taking: Reported on 10/15/2016 10/12/16   Azalia BilisKevin Campos, MD    Family History No family history on file.  Social  History Social History  Substance Use Topics  . Smoking status: Current Every Day Smoker    Packs/day: 1.00    Types: Cigarettes  . Smokeless tobacco: Never Used  . Alcohol use No     Allergies   Other   Review of Systems Review of Systems   Physical Exam Updated Vital Signs BP 138/94 (BP Location: Left Arm)   Pulse 78   Temp 97.9 F (36.6 C) (Oral)   Resp 19   LMP 10/07/2016   SpO2 100%   Physical Exam  Constitutional: She is oriented to person, place, and time. She appears well-developed and well-nourished. No distress.  HENT:  Head: Normocephalic and atraumatic.  Nose: Nose normal.  Mouth/Throat: Oropharynx is clear and moist. No oropharyngeal exudate.  Eyes: Conjunctivae and EOM are normal. Pupils are equal, round, and reactive to light. No scleral icterus.  Neck: Normal range of motion. Neck supple. No JVD present. No tracheal deviation present. No thyromegaly present.  Cardiovascular: Normal rate, regular rhythm and normal heart sounds.  Exam reveals no gallop and no friction rub.   No murmur heard. Pulmonary/Chest: Effort normal and breath sounds normal. No respiratory distress. She has no wheezes. She exhibits no tenderness.  Abdominal: Soft. Bowel sounds are normal.  She exhibits no distension and no mass. There is no tenderness. There is no rebound and no guarding.  Musculoskeletal: Normal range of motion. She exhibits no edema or tenderness.  Lymphadenopathy:    She has no cervical adenopathy.  Neurological: She is alert and oriented to person, place, and time. No cranial nerve deficit. She exhibits normal muscle tone.  Skin: Skin is warm and dry. No rash noted. No erythema. No pallor.  Psychiatric:  Suicidal ideations  Nursing note and vitals reviewed.    ED Treatments / Results  Labs (all labs ordered are listed, but only abnormal results are displayed) Labs Reviewed  ETHANOL - Abnormal; Notable for the following:       Result Value   Alcohol,  Ethyl (B) 236 (*)    All other components within normal limits  ACETAMINOPHEN LEVEL - Abnormal; Notable for the following:    Acetaminophen (Tylenol), Serum <10 (*)    All other components within normal limits  CBC - Abnormal; Notable for the following:    RDW 16.0 (*)    All other components within normal limits  RAPID URINE DRUG SCREEN, HOSP PERFORMED - Abnormal; Notable for the following:    Cocaine POSITIVE (*)    All other components within normal limits  COMPREHENSIVE METABOLIC PANEL  SALICYLATE LEVEL  IRON AND TIBC  CBG MONITORING, ED    EKG  EKG Interpretation  Date/Time:  Wednesday October 15 2016 05:05:11 EST Ventricular Rate:  80 PR Interval:    QRS Duration: 109 QT Interval:  377 QTC Calculation: 435 R Axis:   12 Text Interpretation:  Sinus rhythm No significant change since last tracing Confirmed by Erroll Luna [phone]) on 10/15/2016 5:42:58 AM       Radiology No results found.  Procedures Procedures (including critical care time)  Medications Ordered in ED Medications  0.9 %  sodium chloride infusion (not administered)     Initial Impression / Assessment and Plan / ED Course  I have reviewed the triage vital signs and the nursing notes.  Pertinent labs & imaging results that were available during my care of the patient were reviewed by me and considered in my medical decision making (see chart for details).     Patient presents to the ED for SI.  She is still having symptoms of this as I interview here. She will require psych eval.  After speaking with poison control, they recommend for 12 hours observation for medical clearance.  They states amlodipine takes max effect around hour 8-10.  Will admit for further care. Dr. Katrinka Blazing has accepted the patient.    Final Clinical Impressions(s) / ED Diagnoses   Final diagnoses:  Suicidal ideation  Alcoholic intoxication without complication (HCC)  Cocaine abuse    New Prescriptions New  Prescriptions   No medications on file     Tomasita Crumble, MD 10/15/16 [phone]

## 2016-10-15 NOTE — H&P (Signed)
[facility] History and Physical  Shirley Murphy ZOX:[id number] DOB: 1986-06-26 DOA: 10/15/2016  Referring physician: ER   PCP: No PCP Per Patient   Chief Complaint: *Suicide attempt  HPI:  31 year old female with a past medical history of depression, previous psychiatric hospitalization for suicidal ideation, alcohol dependence, admits to drinking on a regular basis who was brought in via EMS for attempted suicide with 8 tablets of Norvasc, cocaine and alcohol. Patient has been depressed because of her  social issues, she has been separated from her children. She currently lives with her sister. After overdosing she called suicide helpline, who called EMS. She admits to drinking hard liquor on a daily basis. Patient was in the ER on 2/25 with abdominal pain. Labs and ultrasound of the abdomen were negative. She was thought to have gastritis associated with ongoing alcohol use and outpatient GI follow-up was recommended. She still complains of epigastric pain  ED course BP 130/100 HR 100 sat 98% RA. Lipase normal, liver function normal, Tylenol level less than 10, alcohol level 236. Urine drug screen positive for cocaine. Patient admitted for suicidal ideation.        Review of Systems: negative for the following  Constitutional: Denies fever, chills, diaphoresis, appetite change and fatigue.  HEENT: Denies photophobia, eye pain, redness, hearing loss, ear pain, congestion, sore throat, rhinorrhea, sneezing, mouth sores, trouble swallowing, neck pain, neck stiffness and tinnitus.  Respiratory: Denies SOB, DOE, cough, chest tightness, and wheezing.  Cardiovascular: Denies chest pain, palpitations and leg swelling.  Gastrointestinal: Denies nausea, vomiting, positive for abdominal pain, diarrhea, constipation, blood in stool and abdominal distention.  Genitourinary: Denies dysuria, urgency, frequency, hematuria, flank pain and difficulty urinating.  Musculoskeletal: Denies  myalgias, back pain, joint swelling, arthralgias and gait problem.  Skin: Denies pallor, rash and wound.  Neurological: Denies dizziness, seizures, syncope, weakness, light-headedness, numbness and headaches.  Hematological: Denies adenopathy. Easy bruising, personal or family bleeding history  Psychiatric/Behavioral: positive suicidal ideation, mood changes, confusion, nervousness, sleep disturbance and agitation       Past Medical History:  Diagnosis Date  . Chest pain   . Hepatitis C   . Hypertension   . Irregular heart beat      Past Surgical History:  Procedure Laterality Date  . c sections    . TUBAL LIGATION        Social History:  reports that she has been smoking Cigarettes.  She has been smoking about 1.00 pack per day. She has never used smokeless tobacco. She reports that she does not drink alcohol or use drugs.    Allergies  Allergen Reactions  . Other     peroxide        FAMILY HISTORY  When questioned  Directly-patient reports  No family history of HTN, CVA ,DIABETES, TB, Cancer CAD, Bleeding Disorders, Sickle Cell, diabetes, anemia, asthma,   Prior to Admission medications   Medication Sig Start Date End Date Taking? Authorizing Provider  naproxen sodium (ANAPROX) 220 MG tablet Take 440 mg by mouth 2 (two) times daily with a meal.   Yes Historical Provider, MD  chlordiazePOXIDE (LIBRIUM) 25 MG capsule 50mg  PO TID x 1D, then 25-50mg  PO BID X 1D, then 25-50mg  PO QD X 1D Patient not taking: Reported on 10/15/2016 10/12/16   Azalia Bilis, MD  omeprazole (PRILOSEC) 20 MG capsule Take 1 capsule (20 mg total) by mouth daily. Patient not taking: Reported on 10/15/2016 10/12/16   Azalia Bilis, MD  promethazine (PHENERGAN) 25 MG tablet Take  1 tablet (25 mg total) by mouth every 6 (six) hours as needed for nausea or vomiting. Patient not taking: Reported on 10/15/2016 10/12/16   Azalia BilisKevin Campos, MD     Physical Exam: Vitals:   10/15/16 0606 10/15/16 0653 10/15/16  0700 10/15/16 0813  BP: 138/94 117/72 115/78 126/79  Pulse: 78 75 70 86  Resp: 19 17 15 16   Temp:    97.8 F (36.6 C)  TempSrc:    Oral  SpO2: 100% 99% 96% 100%  Weight:    104.6 kg (230 lb 9.6 oz)  Height:    5\' 1"  (1.549 m)      Constitutional: NAD, calm, comfortable Vitals:   10/15/16 0606 10/15/16 0653 10/15/16 0700 10/15/16 0813  BP: 138/94 117/72 115/78 126/79  Pulse: 78 75 70 86  Resp: 19 17 15 16   Temp:    97.8 F (36.6 C)  TempSrc:    Oral  SpO2: 100% 99% 96% 100%  Weight:    104.6 kg (230 lb 9.6 oz)  Height:    5\' 1"  (1.549 m)   Eyes: PERRL, lids and conjunctivae normal ENMT: Mucous membranes are moist. Posterior pharynx clear of any exudate or lesions.Normal dentition.  Neck: normal, supple, no masses, no thyromegaly Respiratory: clear to auscultation bilaterally, no wheezing, no crackles. Normal respiratory effort. No accessory muscle use.  Cardiovascular: Regular rate and rhythm, no murmurs / rubs / gallops. No extremity edema. 2+ pedal pulses. No carotid bruits.  Abdomen: no tenderness, no masses palpated. No hepatosplenomegaly. Bowel sounds positive.  Musculoskeletal: no clubbing / cyanosis. No joint deformity upper and lower extremities. Good ROM, no contractures. Normal muscle tone.  Skin: no rashes, lesions, ulcers. No induration Neurologic: CN 2-12 grossly intact. Sensation intact, DTR normal. Strength 5/5 in all 4.  Psychiatric: Normal judgment and insight. Alert and oriented x 3. Normal mood.     Labs on Admission: I have personally reviewed following labs and imaging studies  CBC:  Recent Labs Lab 10/12/16 1313 10/15/16 0452  WBC 6.7 6.4  HGB 12.5 12.0  HCT 38.4 36.6  MCV 82.9 81.5  PLT 265 274    Basic Metabolic Panel:  Recent Labs Lab 10/12/16 1313 10/15/16 0452  NA 140 142  K 4.1 3.7  CL 109 111  CO2 24 22  GLUCOSE 89 80  BUN 16 16  CREATININE 1.05* 0.88  CALCIUM 9.2 9.1  MG  --  2.3    GFR: Estimated Creatinine  Clearance: 105 mL/min (by C-G formula based on SCr of 0.88 mg/dL).  Liver Function Tests:  Recent Labs Lab 10/12/16 1313 10/15/16 0452  AST 20 19  ALT 17 16  ALKPHOS 50 50  BILITOT 0.8 0.6  PROT 7.4 7.3  ALBUMIN 4.2 4.2    Recent Labs Lab 10/12/16 1313 10/15/16 0453  LIPASE 39 23   No results for input(s): AMMONIA in the last 168 hours.  Coagulation Profile: No results for input(s): INR, PROTIME in the last 168 hours. No results for input(s): DDIMER in the last 72 hours.  Cardiac Enzymes:  Recent Labs Lab 10/15/16 0452  TROPONINI <0.03    BNP (last 3 results) No results for input(s): PROBNP in the last 8760 hours.  HbA1C: No results for input(s): HGBA1C in the last 72 hours. No results found for: HGBA1C   CBG: No results for input(s): GLUCAP in the last 168 hours.  Lipid Profile: No results for input(s): CHOL, HDL, LDLCALC, TRIG, CHOLHDL, LDLDIRECT in the last 72 hours.  Thyroid Function Tests: No results for input(s): TSH, T4TOTAL, FREET4, T3FREE, THYROIDAB in the last 72 hours.  Anemia Panel:  Recent Labs  10/15/16 0457  TIBC 498*  IRON 14*    Urine analysis:    Component Value Date/Time   COLORURINE YELLOW 10/12/2016 1509   APPEARANCEUR CLEAR 10/12/2016 1509   LABSPEC 1.029 10/12/2016 1509   PHURINE 5.0 10/12/2016 1509   GLUCOSEU NEGATIVE 10/12/2016 1509   HGBUR NEGATIVE 10/12/2016 1509   BILIRUBINUR NEGATIVE 10/12/2016 1509   KETONESUR NEGATIVE 10/12/2016 1509   PROTEINUR NEGATIVE 10/12/2016 1509   NITRITE NEGATIVE 10/12/2016 1509   LEUKOCYTESUR NEGATIVE 10/12/2016 1509    Sepsis Labs: @LABRCNTIP (procalcitonin:4,lacticidven:4) )No results found for this or any previous visit (from the past 240 hour(s)).       Radiological Exams on Admission: No results found. US Abdomen Limited Ruq  Result Date: 10/12/2016 CLINICAL DATA:  Right upper quadrant pain for 5 days. EXAM: US ABDOMEN LIMITED - RIGHT UPPER QUADRANT COMPARISON:   None. FINDINGS: Gallbladder: No gallstones or wall thickening visualized. No sonographic Murphy sign noted by sonographer. Common bile duct: Diameter: 2.6 mm Liver: No focal lesion identified. Within normal limits in parenchymal echogenicity. IMPRESSION: Normal right upper quadrant ultrasound. Electronically Signed   By: Amie Portland M.D.   On: 10/12/2016 16:26      EKG: Independently reviewed.   Assessment/Plan     Suicide attempt Will obtain psychiatric evaluation to see if IVC is indicated Continue suicide precautions Patent attorney Social work consult to assess home situation  Alcohol Dependence Start CIWA protocol Folic acid and thiamine   HTN -BP stable , hold Norvasc for now     Irregular heart beat- continue telemetry  Check magnesium, replete potassium    DVT prophylaxis: Lovenox     Code Status Orders full code        consults called: psychiatry   Family Communication: Admission, patients condition and plan of care including tests being ordered have been discussed with the patient  who indicates understanding and agree with the plan and Code Status  Admission status: inpatient    Disposition plan: Further plan will depend as patient's clinical course evolves and further radiologic and laboratory data become available. Likely home when stable   At the time of admission, it appears that the appropriate admission status for this patient is INPATIENT .Thisis judged to be reasonable and necessary in order to provide the required intensity of service to ensure the patient's safetygiven thepresenting symptoms, physical exam findings, and initial radiographic and laboratory data in the context of their chronic comorbidities.   Richarda Overlie MD [facility] Pager [phone]  If 7PM-7AM, please contact night-coverage [email or web address] Password [email or web address]  10/15/2016, 9:34 AM

## 2016-10-15 NOTE — ED Triage Notes (Signed)
Patient arrives by EMS with Suicide attempt/took Amlodipine x 8 tabs. Unknown time and dosage-sister's medication. EMS states patient met them outside by the mailbox. Patient called EMS. BP 130/100 HR 100 sat 98% RA.

## 2016-10-15 NOTE — ED Notes (Signed)
Spoke with Patty from [employer]: -12 hour Observation minimum -medical clearance labs -iron level -cardiac monitoring

## 2016-10-15 NOTE — Progress Notes (Signed)
Pharmacy IV to PO conversion  The patient is ordered thiamine by the intravenous route.  Based on criteria approved by the Pharmacy and Therapeutics Committee and the Medical Executive Committee, the medication is being discontinued (PO orders remain).   No active GI bleeding or impaired absorption  Not s/p esophagectomy  Documented ability to take oral medications for > 24 hr  Plan to continue treatment for at least 1 day  If you have any questions about this conversion, please contact the Pharmacy Department (ext [phone]).  Thank you.  Bernadene Personrew Adelene Polivka, PharmD Pager: [phone] 10/15/2016, 12:49 PM

## 2016-10-15 NOTE — ED Notes (Signed)
Pt states that she doesn't want to live anymore, she cant get her kids back and all she ever does is drink. Pt states she took blood pressure pills and did cocaine, and drank atleast a fifth of liquor
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
## 2016-10-15 NOTE — ED Notes (Signed)
Pt's belongings are behind the nurses station at the code cart in front of rm 15

## 2016-10-16 DIAGNOSIS — T1491XA Suicide attempt, initial encounter: Secondary | ICD-10-CM

## 2016-10-16 LAB — COMPREHENSIVE METABOLIC PANEL
ALBUMIN: 3.8 g/dL (ref 3.5–5.0)
ALK PHOS: 50 U/L (ref 38–126)
ALT: 17 U/L (ref 14–54)
ANION GAP: 7 (ref 5–15)
AST: 19 U/L (ref 15–41)
BILIRUBIN TOTAL: 0.8 mg/dL (ref 0.3–1.2)
BUN: 14 mg/dL (ref 6–20)
CO2: 26 mmol/L (ref 22–32)
Calcium: 9.1 mg/dL (ref 8.9–10.3)
Chloride: 104 mmol/L (ref 101–111)
Creatinine, Ser: 0.86 mg/dL (ref 0.44–1.00)
GFR calc Af Amer: >60 mL/min (ref >60)
GLUCOSE: 82 mg/dL (ref 65–99)
POTASSIUM: 3.6 mmol/L (ref 3.5–5.1)
Sodium: 137 mmol/L (ref 135–145)
TOTAL PROTEIN: 7.2 g/dL (ref 6.5–8.1)

## 2016-10-16 LAB — HIV ANTIBODY (ROUTINE TESTING W REFLEX): HIV SCREEN 4TH GENERATION: NONREACTIVE

## 2016-10-16 LAB — CBC
HEMATOCRIT: 36.3 % (ref 36.0–46.0)
Hemoglobin: 11.8 g/dL — ABNORMAL LOW (ref 12.0–15.0)
MCH: 26.9 pg (ref 26.0–34.0)
MCHC: 32.5 g/dL (ref 30.0–36.0)
MCV: 82.7 fL (ref 78.0–100.0)
Platelets: 286 10*3/uL (ref 150–400)
RBC: 4.39 MIL/uL (ref 3.87–5.11)
RDW: 16.1 % — AB (ref 11.5–15.5)
WBC: 6.8 10*3/uL (ref 4.0–10.5)

## 2016-10-16 LAB — PROTIME-INR
INR: 0.92
PROTHROMBIN TIME: 12.4 s (ref 11.4–15.2)

## 2016-10-16 LAB — ACETAMINOPHEN LEVEL

## 2016-10-16 MED ORDER — NICOTINE 21 MG/24HR TD PT24
21.0000 mg | MEDICATED_PATCH | Freq: Every day | TRANSDERMAL | Status: DC
  Administered 2016-10-16 – 2016-10-17 (×2): 21 mg via TRANSDERMAL
  Filled 2016-10-16 (×2): qty 1

## 2016-10-16 NOTE — Progress Notes (Signed)
LCSWA assisting  with patient discharge to inpatient psychiatric hospital. Patient is voluntary at this time.  Will inform medical staff when bed is available.   Vivi BarrackNicole Maijor Hornig, Theresia MajorsLCSWA, MSW Clinical Social Worker 5E and Psychiatric Service Line [phone] 10/16/2016  11:17 AM

## 2016-10-16 NOTE — Progress Notes (Signed)
PROGRESS NOTE    Shirley Murphy  ZOX:[id number] DOB: May 07, 1986 DOA: 10/15/2016 PCP: No PCP Per Patient     Brief Narrative:  Shirley Murphy is a 31 year old female with a past medical history of depression, previous psychiatric hospitalization for suicidal ideation, alcohol dependence, admits to drinking on a regular basis who was brought in via EMS for attempted suicide with 8 tablets of Norvasc, cocaine and alcohol. Patient has been depressed because of her social issues, she has been separated from her children. She currently lives with her sister. After overdosing she called suicide helpline, who called EMS. She admits to drinking hard liquor on a daily basis. She was admitted due to suicidal ideation. Psychiatry was also consulted.  Assessment & Plan:   Principal Problem:   Suicide attempt Active Problems:   HTN (hypertension), benign   Irregular heart beat   Alcoholic intoxication without complication (HCC)   Suicidal ideation  Suicide attempt -Continue suicide precautions -Patent attorneyContinue safety sitter -Social work consult  -Psych consulted: Started Zoloft 25 mg daily for depression and anxiety. Recommending inpatient psychiatric admission.  Alcohol dependence -CIWA protocol -Folic acid and thiamine   Tobacco abuse -Nicotine patch   DVT prophylaxis: Lovenox Code Status: Full Family Communication: no family at bedside  Disposition Plan: inpatient psych facility when available    Consultants:   Psych  Procedures:   None  Antimicrobials:   None    Subjective: Patient complaining of some nausea overnight but was able to tolerate breakfast without issues this morning. She is willing to go to inpatient psychiatric facility and awaiting bed currently. Sitter is at bedside.    Objective: Vitals:   10/15/16 2047 10/16/16 0054 10/16/16 0633 10/16/16 1235  BP: (!) 143/89 132/82 136/83 (!) 155/86  Pulse: 78 79 65 88  Resp: 16  18 16   Temp: 98.4 F (36.9 C)   98.6 F (37 C) 97.7 F (36.5 C)  TempSrc: Oral  Oral Oral  SpO2: 100%  99% 99%  Weight:      Height:        Intake/Output Summary (Last 24 hours) at 10/16/16 1326 Last data filed at 10/16/16 1325  Gross per 24 hour  Intake             4625 ml  Output                0 ml  Net             4625 ml   Filed Weights   10/15/16 0813  Weight: 104.6 kg (230 lb 9.6 oz)    Examination:  General exam: Appears calm and comfortable  Respiratory system: Clear to auscultation. Respiratory effort normal. Cardiovascular system: S1 & S2 heard, RRR. No JVD, murmurs, rubs, gallops or clicks. No pedal edema. Gastrointestinal system: Abdomen is nondistended, soft and nontender. No organomegaly or masses felt. Normal bowel sounds heard. Central nervous system: Alert and oriented. No focal neurological deficits. Extremities: Symmetric 5 x 5 power. Skin: No rashes, lesions or ulcers Psychiatry: Judgement and insight appear normal. Mood & affect appropriate.   Data Reviewed: I have personally reviewed following labs and imaging studies  CBC:  Recent Labs Lab 10/12/16 1313 10/15/16 0452 10/16/16 0522  WBC 6.7 6.4 6.8  HGB 12.5 12.0 11.8*  HCT 38.4 36.6 36.3  MCV 82.9 81.5 82.7  PLT 265 274 286   Basic Metabolic Panel:  Recent Labs Lab 10/12/16 1313 10/15/16 0452 10/15/16 1343 10/16/16 0522  NA 140 142 137 137  K 4.1 3.7 4.2 3.6  CL 109 111 107 104  CO2 24 22 22 26   GLUCOSE 89 80 79 82  BUN 16 16 14 14   CREATININE 1.05* 0.88 0.85 0.86  CALCIUM 9.2 9.1 9.7 9.1  MG  --  2.3  --   --    GFR: Estimated Creatinine Clearance: 107.4 mL/min (by C-G formula based on SCr of 0.86 mg/dL). Liver Function Tests:  Recent Labs Lab 10/12/16 1313 10/15/16 0452 10/15/16 1343 10/16/16 0522  AST 20 19 19 19   ALT 17 16 17 17   ALKPHOS 50 50 55 50  BILITOT 0.8 0.6 0.5 0.8  PROT 7.4 7.3 7.5 7.2  ALBUMIN 4.2 4.2 4.1 3.8    Recent Labs Lab 10/12/16 1313 10/15/16 0453  LIPASE 39 23    No results for input(s): AMMONIA in the last 168 hours. Coagulation Profile:  Recent Labs Lab 10/16/16 0522  INR 0.92   Cardiac Enzymes:  Recent Labs Lab 10/15/16 0452 10/15/16 1343 10/15/16 1922  TROPONINI <0.03 <0.03 <0.03   BNP (last 3 results) No results for input(s): PROBNP in the last 8760 hours. HbA1C: No results for input(s): HGBA1C in the last 72 hours. CBG:  Recent Labs Lab 10/15/16 0506  GLUCAP 92   Lipid Profile: No results for input(s): CHOL, HDL, LDLCALC, TRIG, CHOLHDL, LDLDIRECT in the last 72 hours. Thyroid Function Tests: No results for input(s): TSH, T4TOTAL, FREET4, T3FREE, THYROIDAB in the last 72 hours. Anemia Panel:  Recent Labs  10/15/16 0457  TIBC 498*  IRON 14*   Sepsis Labs: No results for input(s): PROCALCITON, LATICACIDVEN in the last 168 hours.  No results found for this or any previous visit (from the past 240 hour(s)).     Radiology Studies: No results found.    Scheduled Meds: . docusate sodium  200 mg Oral QHS  . enoxaparin (LOVENOX) injection  40 mg Subcutaneous Q24H  . folic acid  1 mg Oral Daily  . multivitamin with minerals  1 tablet Oral Daily  . nicotine  21 mg Transdermal Daily  . pantoprazole  40 mg Oral Daily  . thiamine  100 mg Oral Daily   Continuous Infusions:   LOS: 0 days    Time spent: 40 minutes   Noralee Stain, DO [facility] [email or web address] Password [email or web address] 10/16/2016, 1:26 PM

## 2016-10-16 NOTE — Care Management Note (Signed)
Case Management Note  Patient Details  Name: Shirley Murphy MRN: [id number] Date of Birth: [birth year]  Subjective/Objective:                    Action/Plan:d/c inpt psych   Expected Discharge Date:                  Expected Discharge Plan:  Psychiatric Hospital  In-House Referral:  Clinical Social Work  Discharge planning Services  CM Consult  Post Acute Care Choice:    Choice offered to:     DME Arranged:    DME Agency:     HH Arranged:    HH Agency:     Status of Service:  Completed, signed off  If discussed at [employer] Meetings, dates discussed:    Additional Comments:  Shirley Murphy, Shirley Dabbs, RN 10/16/2016, 11:57 AM

## 2016-10-16 NOTE — Progress Notes (Signed)
Poison control called to follow up on patients condition. Informed them that she has been stable today. They stated that based on their information she was all clear.

## 2016-10-17 ENCOUNTER — Encounter (HOSPITAL_COMMUNITY): Payer: Self-pay

## 2016-10-17 ENCOUNTER — Inpatient Hospital Stay (HOSPITAL_COMMUNITY)
Admission: AD | Admit: 2016-10-17 | Discharge: 2016-10-21 | DRG: 885 | Disposition: A | Discharge status: Home / Self Care | Payer: Medicaid - Out of State | Source: Intra-hospital | Attending: Psychiatry | Admitting: Psychiatry

## 2016-10-17 DIAGNOSIS — F1020 Alcohol dependence, uncomplicated: Secondary | ICD-10-CM | POA: Diagnosis present

## 2016-10-17 DIAGNOSIS — F329 Major depressive disorder, single episode, unspecified: Secondary | ICD-10-CM | POA: Diagnosis present

## 2016-10-17 DIAGNOSIS — K219 Gastro-esophageal reflux disease without esophagitis: Secondary | ICD-10-CM | POA: Diagnosis present

## 2016-10-17 DIAGNOSIS — F411 Generalized anxiety disorder: Secondary | ICD-10-CM | POA: Diagnosis present

## 2016-10-17 DIAGNOSIS — Z888 Allergy status to other drugs, medicaments and biological substances status: Secondary | ICD-10-CM

## 2016-10-17 DIAGNOSIS — F332 Major depressive disorder, recurrent severe without psychotic features: Secondary | ICD-10-CM | POA: Diagnosis present

## 2016-10-17 DIAGNOSIS — R45851 Suicidal ideations: Secondary | ICD-10-CM | POA: Diagnosis present

## 2016-10-17 DIAGNOSIS — T461X2A Poisoning by calcium-channel blockers, intentional self-harm, initial encounter: Secondary | ICD-10-CM | POA: Diagnosis not present

## 2016-10-17 DIAGNOSIS — Z79899 Other long term (current) drug therapy: Secondary | ICD-10-CM | POA: Diagnosis not present

## 2016-10-17 DIAGNOSIS — K709 Alcoholic liver disease, unspecified: Secondary | ICD-10-CM | POA: Diagnosis present

## 2016-10-17 DIAGNOSIS — F17210 Nicotine dependence, cigarettes, uncomplicated: Secondary | ICD-10-CM | POA: Diagnosis present

## 2016-10-17 DIAGNOSIS — B1920 Unspecified viral hepatitis C without hepatic coma: Secondary | ICD-10-CM | POA: Diagnosis present

## 2016-10-17 DIAGNOSIS — Z915 Personal history of self-harm: Secondary | ICD-10-CM

## 2016-10-17 DIAGNOSIS — T1491XA Suicide attempt, initial encounter: Secondary | ICD-10-CM | POA: Diagnosis not present

## 2016-10-17 DIAGNOSIS — F1010 Alcohol abuse, uncomplicated: Secondary | ICD-10-CM | POA: Diagnosis not present

## 2016-10-17 DIAGNOSIS — F1410 Cocaine abuse, uncomplicated: Secondary | ICD-10-CM | POA: Diagnosis not present

## 2016-10-17 MED ORDER — LORAZEPAM 1 MG PO TABS
1.0000 mg | ORAL_TABLET | Freq: Four times a day (QID) | ORAL | Status: AC
  Administered 2016-10-17 – 2016-10-18 (×4): 1 mg via ORAL
  Filled 2016-10-17 (×4): qty 1

## 2016-10-17 MED ORDER — VITAMIN B-1 100 MG PO TABS
100.0000 mg | ORAL_TABLET | Freq: Every day | ORAL | Status: DC
  Administered 2016-10-18 – 2016-10-21 (×4): 100 mg via ORAL
  Filled 2016-10-17 (×7): qty 1

## 2016-10-17 MED ORDER — LORAZEPAM 1 MG PO TABS
1.0000 mg | ORAL_TABLET | Freq: Three times a day (TID) | ORAL | Status: AC
  Administered 2016-10-18 – 2016-10-19 (×3): 1 mg via ORAL
  Filled 2016-10-17 (×3): qty 1

## 2016-10-17 MED ORDER — LORAZEPAM 1 MG PO TABS
1.0000 mg | ORAL_TABLET | Freq: Every day | ORAL | Status: AC
  Administered 2016-10-21: 1 mg via ORAL
  Filled 2016-10-17: qty 1

## 2016-10-17 MED ORDER — THIAMINE HCL 100 MG/ML IJ SOLN: 100.0000 mg | Freq: Once | INTRAMUSCULAR | Status: DC

## 2016-10-17 MED ORDER — TRAZODONE HCL 50 MG PO TABS
50.0000 mg | ORAL_TABLET | Freq: Every evening | ORAL | Status: DC | PRN
  Administered 2016-10-19 (×2): 50 mg via ORAL
  Filled 2016-10-17 (×7): qty 1

## 2016-10-17 MED ORDER — ALUM & MAG HYDROXIDE-SIMETH 200-200-20 MG/5ML PO SUSP: 30.0000 mL | ORAL | Status: DC | PRN

## 2016-10-17 MED ORDER — TRAZODONE HCL 50 MG PO TABS
50.0000 mg | ORAL_TABLET | Freq: Every evening | ORAL | Status: DC | PRN
  Filled 2016-10-17 (×2): qty 1

## 2016-10-17 MED ORDER — SERTRALINE HCL 25 MG PO TABS: 25.0000 mg | ORAL_TABLET | Freq: Every day | ORAL | Status: DC

## 2016-10-17 MED ORDER — DIPHENHYDRAMINE HCL 25 MG PO CAPS
25.0000 mg | ORAL_CAPSULE | Freq: Four times a day (QID) | ORAL | Status: DC | PRN
  Filled 2016-10-17: qty 1

## 2016-10-17 MED ORDER — LORAZEPAM 1 MG PO TABS
1.0000 mg | ORAL_TABLET | Freq: Once | ORAL | Status: AC | PRN
  Administered 2016-10-19: 1 mg via ORAL
  Filled 2016-10-17: qty 1

## 2016-10-17 MED ORDER — LORAZEPAM 1 MG PO TABS
1.0000 mg | ORAL_TABLET | Freq: Two times a day (BID) | ORAL | Status: AC
  Administered 2016-10-19 – 2016-10-20 (×2): 1 mg via ORAL
  Filled 2016-10-17 (×2): qty 1

## 2016-10-17 MED ORDER — ONDANSETRON 4 MG PO TBDP: 4.0000 mg | ORAL_TABLET | Freq: Four times a day (QID) | ORAL | Status: AC | PRN

## 2016-10-17 MED ORDER — ACETAMINOPHEN 325 MG PO TABS
650.0000 mg | ORAL_TABLET | Freq: Four times a day (QID) | ORAL | Status: DC | PRN
  Administered 2016-10-19: 650 mg via ORAL
  Filled 2016-10-17: qty 2

## 2016-10-17 MED ORDER — LOPERAMIDE HCL 2 MG PO CAPS: 2.0000 mg | ORAL_CAPSULE | ORAL | Status: AC | PRN

## 2016-10-17 MED ORDER — HYDROXYZINE HCL 25 MG PO TABS
25.0000 mg | ORAL_TABLET | Freq: Four times a day (QID) | ORAL | Status: DC | PRN
  Administered 2016-10-17 – 2016-10-20 (×5): 25 mg via ORAL
  Filled 2016-10-17 (×5): qty 1

## 2016-10-17 MED ORDER — LORAZEPAM 1 MG PO TABS
1.0000 mg | ORAL_TABLET | Freq: Four times a day (QID) | ORAL | Status: AC | PRN
  Administered 2016-10-17 – 2016-10-20 (×2): 1 mg via ORAL
  Filled 2016-10-17 (×4): qty 1

## 2016-10-17 MED ORDER — MAGNESIUM HYDROXIDE 400 MG/5ML PO SUSP
30.0000 mL | Freq: Every day | ORAL | Status: DC | PRN
  Administered 2016-10-20: 30 mL via ORAL
  Filled 2016-10-17: qty 30

## 2016-10-17 MED ORDER — NICOTINE 21 MG/24HR TD PT24
21.0000 mg | MEDICATED_PATCH | Freq: Every day | TRANSDERMAL | Status: DC
  Administered 2016-10-18 – 2016-10-21 (×4): 21 mg via TRANSDERMAL
  Filled 2016-10-17 (×7): qty 1

## 2016-10-17 MED ORDER — SERTRALINE HCL 25 MG PO TABS
25.0000 mg | ORAL_TABLET | Freq: Every day | ORAL | Status: DC
  Administered 2016-10-17 – 2016-10-18 (×2): 25 mg via ORAL
  Filled 2016-10-17 (×5): qty 1

## 2016-10-17 MED ORDER — ADULT MULTIVITAMIN W/MINERALS CH
1.0000 | ORAL_TABLET | Freq: Every day | ORAL | Status: DC
  Administered 2016-10-18 – 2016-10-21 (×4): 1 via ORAL
  Filled 2016-10-17 (×6): qty 1

## 2016-10-17 NOTE — Progress Notes (Signed)
[facility]Penny Caiti [facility]. [facility],
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
## 2016-10-17 NOTE — Progress Notes (Signed)
[facility] Accepting Dr. Henrene HawkingJonnalaggada  Attending Dr. Jama Flavorsobos Patient is voluntary and will transported by [facility]. Voluntary Form signed and Faxed to [phone] Report # [phone]  RN informed.   Vivi BarrackNicole Omelia Marquart, Theresia MajorsLCSWA, MSW Clinical Social Worker 5E and Psychiatric Service Line [phone] 10/17/2016  12:16 PM

## 2016-10-17 NOTE — Care Management Note (Signed)
Case Management Note  Patient Details  Name: Princella IonMinnie Barstow MRN: [id number] Date of Birth: Sep 08, 1985  Subjective/Objective:                    Action/Plan:dc Inpt Psych   Expected Discharge Date:  10/17/16               Expected Discharge Plan:  Psychiatric Hospital  In-House Referral:  Clinical Social Work  Discharge planning Services  CM Consult  Post Acute Care Choice:    Choice offered to:     DME Arranged:    DME Agency:     HH Arranged:    HH Agency:     Status of Service:  Completed, signed off  If discussed at [employer] Meetings, dates discussed:    Additional Comments:  Lanier ClamMahabir, Jasemine Nawaz, RN 10/17/2016, 1:06 PM

## 2016-10-17 NOTE — Progress Notes (Signed)
[facility]

## 2016-10-17 NOTE — Progress Notes (Signed)
D.  Pt suffering withdrawal symptoms on approach, complaint of anxiety (see CIWA and COW).  Pt was positive for evening AA group, interacting appropriately with peers on unit.  Pt reports that she was not honest during admission because she was embarrassed and ashamed, but Pt states that she also uses heroin up to 5 times a week and would do it every day if she could afford to.  Pt also states that she has been using crack cocaine.  Pt reports that t his is the longest time she has gone without drinking alcohol "in years".  Pt states she drinks until she passes out and this is the way she is used to going to sleep.  Pt states she wants to get clean now because she does not want to "go through this again".  Pt denies SI/HI/hallucinations at this time.  A.  Support and encouragement offered, Pt encouraged to be honest with treatment team so that she can receive proper treatment and correct withdrawal medication.  R.  Pt remains safe on the unit, will continue to monitor.

## 2016-10-17 NOTE — Tx Team (Signed)
Initial Treatment Plan 10/17/2016 5:52 PM Shirley Murphy [id number]    PATIENT STRESSORS: Loss of children Substance abuse Traumatic event   PATIENT STRENGTHS: Ability for insight Active sense of humor Capable of independent living Motivation for treatment/growth Work skills   PATIENT IDENTIFIED PROBLEMS: "My drinking"  "My anxiety'  Grieving  Depression  Risk for Suicide  Ineffectual individual coping  Substance/Alcohol  Post Trauma Response       DISCHARGE CRITERIA:  Ability to meet basic life and health needs Adequate post-discharge living arrangements Improved stabilization in mood, thinking, and/or behavior Motivation to continue treatment in a less acute level of care Need for constant or close observation no longer present Reduction of life-threatening or endangering symptoms to within safe limits Safe-care adequate arrangements made Verbal commitment to aftercare and medication compliance Withdrawal symptoms are absent or subacute and managed without 24-hour nursing intervention  PRELIMINARY DISCHARGE PLAN: Outpatient therapy  PATIENT/FAMILY INVOLVEMENT: This treatment plan has been presented to and reviewed with the patient, Shirley Murphy.  The patient has been given the opportunity to ask questions and make suggestions.  Almira BarPenny G Shauntel Prest, RN 10/17/2016, 5:52 PM

## 2016-10-17 NOTE — Progress Notes (Signed)
PROGRESS NOTE    Dejanique Ruehl  ZOX:[id number] DOB: August 21, 1985 DOA: 10/15/2016 PCP: No PCP Per Patient     Brief Narrative:  Shirley Murphy is a 31 year old female with a past medical history of depression, previous psychiatric hospitalization for suicidal ideation, alcohol dependence, admits to drinking on a regular basis who was brought in via EMS for attempted suicide with 8 tablets of Norvasc, cocaine and alcohol. Patient has been depressed because of her social issues, she has been separated from her children. She currently lives with her sister. After overdosing she called suicide helpline, who called EMS. She admits to drinking hard liquor on a daily basis. She was admitted due to suicidal ideation. Psychiatry was also consulted.  Assessment & Plan:   Principal Problem:   Suicide attempt Active Problems:   HTN (hypertension), benign   Irregular heart beat   Alcoholic intoxication without complication (HCC)   Suicidal ideation  Suicide attempt -Continue suicide precautions -Patent attorney -Social work consult  -Psych consulted: Started Zoloft 25 mg daily for depression and anxiety. Recommending inpatient psychiatric admission.  Alcohol dependence -CIWA protocol -Folic acid and thiamine   Tobacco abuse -Nicotine patch   Allergies -Benadryl prn   DVT prophylaxis: Lovenox Code Status: Full Family Communication: no family at bedside  Disposition Plan: inpatient psych facility when available    Consultants:   Psych  Procedures:   None  Antimicrobials:   None    Subjective: Patient complaining of sinus congestion, sneezing, itchy eyes. She complains of allergies and wonders if she has a sinus infection. No other new complaints.   Objective: Vitals:   10/16/16 0633 10/16/16 1235 10/16/16 2058 10/17/16 0553  BP: 136/83 (!) 155/86 (!) 157/89 132/74  Pulse: 65 88 79 71  Resp: 18 16 16 16   Temp: 98.6 F (37 C) 97.7 F (36.5 C) 98.1 F (36.7  C) 97.9 F (36.6 C)  TempSrc: Oral Oral Oral Oral  SpO2: 99% 99% 99% 96%  Weight:      Height:        Intake/Output Summary (Last 24 hours) at 10/17/16 1119 Last data filed at 10/17/16 0816  Gross per 24 hour  Intake             1560 ml  Output                0 ml  Net             1560 ml   Filed Weights   10/15/16 0813  Weight: 104.6 kg (230 lb 9.6 oz)    Examination:  General exam: Appears calm and comfortable  Respiratory system: Clear to auscultation. Respiratory effort normal. Cardiovascular system: S1 & S2 heard, RRR. No JVD, murmurs, rubs, gallops or clicks. No pedal edema. Gastrointestinal system: Abdomen is nondistended, soft and nontender. No organomegaly or masses felt. Normal bowel sounds heard. Central nervous system: Alert and oriented. No focal neurological deficits. Extremities: Symmetric 5 x 5 power. Skin: No rashes, lesions or ulcers Psychiatry: Judgement and insight appear normal. Mood & affect appropriate.   Data Reviewed: I have personally reviewed following labs and imaging studies  CBC:  Recent Labs Lab 10/12/16 1313 10/15/16 0452 10/16/16 0522  WBC 6.7 6.4 6.8  HGB 12.5 12.0 11.8*  HCT 38.4 36.6 36.3  MCV 82.9 81.5 82.7  PLT 265 274 286   Basic Metabolic Panel:  Recent Labs Lab 10/12/16 1313 10/15/16 0452 10/15/16 1343 10/16/16 0522  NA 140 142 137 137  K 4.1  3.7 4.2 3.6  CL 109 111 107 104  CO2 24 22 22 26   GLUCOSE 89 80 79 82  BUN 16 16 14 14   CREATININE 1.05* 0.88 0.85 0.86  CALCIUM 9.2 9.1 9.7 9.1  MG  --  2.3  --   --    GFR: Estimated Creatinine Clearance: 107.4 mL/min (by C-G formula based on SCr of 0.86 mg/dL). Liver Function Tests:  Recent Labs Lab 10/12/16 1313 10/15/16 0452 10/15/16 1343 10/16/16 0522  AST 20 19 19 19   ALT 17 16 17 17   ALKPHOS 50 50 55 50  BILITOT 0.8 0.6 0.5 0.8  PROT 7.4 7.3 7.5 7.2  ALBUMIN 4.2 4.2 4.1 3.8    Recent Labs Lab 10/12/16 1313 10/15/16 0453  LIPASE 39 23   No  results for input(s): AMMONIA in the last 168 hours. Coagulation Profile:  Recent Labs Lab 10/16/16 0522  INR 0.92   Cardiac Enzymes:  Recent Labs Lab 10/15/16 0452 10/15/16 1343 10/15/16 1922  TROPONINI <0.03 <0.03 <0.03   BNP (last 3 results) No results for input(s): PROBNP in the last 8760 hours. HbA1C: No results for input(s): HGBA1C in the last 72 hours. CBG:  Recent Labs Lab 10/15/16 0506  GLUCAP 92   Lipid Profile: No results for input(s): CHOL, HDL, LDLCALC, TRIG, CHOLHDL, LDLDIRECT in the last 72 hours. Thyroid Function Tests: No results for input(s): TSH, T4TOTAL, FREET4, T3FREE, THYROIDAB in the last 72 hours. Anemia Panel:  Recent Labs  10/15/16 0457  TIBC 498*  IRON 14*   Sepsis Labs: No results for input(s): PROCALCITON, LATICACIDVEN in the last 168 hours.  No results found for this or any previous visit (from the past 240 hour(s)).     Radiology Studies: No results found.    Scheduled Meds: . docusate sodium  200 mg Oral QHS  . enoxaparin (LOVENOX) injection  40 mg Subcutaneous Q24H  . folic acid  1 mg Oral Daily  . multivitamin with minerals  1 tablet Oral Daily  . nicotine  21 mg Transdermal Daily  . pantoprazole  40 mg Oral Daily  . thiamine  100 mg Oral Daily   Continuous Infusions:   LOS: 0 days    Time spent: 20 minutes   Noralee StainJennifer Necia Kamm, DO [facility] [email or web address] Password [email or web address] 10/17/2016, 11:19 AM

## 2016-10-17 NOTE — Progress Notes (Signed)
Chaplain attempted to visit with patient to answer consult for Si.  Patient asleep and tech at bedside.  Patient is waiting to talk to a Child psychotherapistocial Worker.  Chaplain will follow up this afternoon  Beryl Meageratina G. Deandrae Wajda Chaplain Resident    10/17/16 1023  Clinical Encounter Type  Visited With Patient not available  Visit Type Initial (Consult from DO for Chaplain visit for SI)

## 2016-10-17 NOTE — Discharge Summary (Signed)
Physician Discharge Summary  Shirley Murphy ZOX:[id number] DOB: [birth year] DOA: 10/15/2016  PCP: No PCP Per Patient  Admit date: 10/15/2016 Discharge date: 10/17/2016  Admitted From: Home [facility]  [facility]  Discharge Condition: stable CODE STATUS: full  Diet recommendation: general  Brief/Interim Summary: Shirley IonMinnie Wentling is a 31 year old female with a past medical history of depression, previous psychiatric hospitalization for suicidal ideation, alcohol dependence, admits to drinking on a regular basis who was brought in via EMS for attempted suicide with 8 tablets of Norvasc, cocaine and alcohol. Patient has been depressed because of her social issues, she has been separated from her children. She currently lives with her sister. After overdosing she called suicide helpline, who called EMS. She admits to drinking hard liquor on a daily basis. She was admitted due to suicidal ideation/attempt. Psychiatry was consulted. She was started on zoloft and [facility].   Discharge Diagnoses:  Principal Problem:   Suicide attempt Active Problems:   HTN (hypertension), benign   Irregular heart beat   Alcoholic intoxication without complication (HCC)   Suicidal ideation  Suicide attempt -Continue suicide precautions -Patent attorneyContinue safety sitter -Social work consult  -Psych consulted: Started Zoloft 25 mg daily for depression and anxiety. Recommending inpatient psychiatric admission.  Alcohol dependence -CIWA protocol -Folic acid and thiamine   Tobacco abuse -Nicotine patch   Allergies -Benadryl prn    Discharge Instructions  Discharge Instructions    Diet general    Complete by:  As directed    Increase activity slowly    Complete by:  As directed      Allergies as of 10/17/2016      Reactions   Other    peroxide      Medication List    STOP taking these medications   chlordiazePOXIDE 25 MG capsule Commonly known as:   LIBRIUM   naproxen sodium 220 MG tablet Commonly known as:  ANAPROX   omeprazole 20 MG capsule Commonly known as:  PRILOSEC   promethazine 25 MG tablet Commonly known as:  PHENERGAN     TAKE these medications   sertraline 25 MG tablet Commonly known as:  ZOLOFT Take 1 tablet (25 mg total) by mouth daily.       Allergies  Allergen Reactions  . Other     peroxide    Consultations:  Psych   Procedures/Studies: [place] Abdomen Limited Ruq  Result Date: 10/12/2016 CLINICAL DATA:  Right upper quadrant pain for 5 days. EXAM: US ABDOMEN LIMITED - RIGHT UPPER QUADRANT COMPARISON:  None. FINDINGS: Gallbladder: No gallstones or wall thickening visualized. No sonographic Murphy sign noted by sonographer. Common bile duct: Diameter: 2.6 mm Liver: No focal lesion identified. Within normal limits in parenchymal echogenicity. IMPRESSION: Normal right upper quadrant ultrasound. Electronically Signed   By: Amie Portlandavid  Ormond M.D.   On: 10/12/2016 16:26    Discharge Exam: Vitals:   10/16/16 2058 10/17/16 0553  BP: (!) 157/89 132/74  Pulse: 79 71  Resp: 16 16  Temp: 98.1 F (36.7 C) 97.9 F (36.6 C)   Vitals:   10/16/16 0633 10/16/16 1235 10/16/16 2058 10/17/16 0553  BP: 136/83 (!) 155/86 (!) 157/89 132/74  Pulse: 65 88 79 71  Resp: 18 16 16 16   Temp: 98.6 F (37 C) 97.7 F (36.5 C) 98.1 F (36.7 C) 97.9 F (36.6 C)  TempSrc: Oral Oral Oral Oral  SpO2: 99% 99% 99% 96%  Weight:      Height:  General: Pt is alert, awake, not in acute distress Cardiovascular: RRR, S1/S2 +, no rubs, no gallops Respiratory: CTA bilaterally, no wheezing, no rhonchi Abdominal: Soft, NT, ND, bowel sounds + Extremities: no edema, no cyanosis    The results of significant diagnostics from this hospitalization (including imaging, microbiology, ancillary and laboratory) are listed below for reference.     Microbiology: No results found for this or any previous visit (from the past 240 hour(s)).    Labs: BNP (last 3 results) No results for input(s): BNP in the last 8760 hours. Basic Metabolic Panel:  Recent Labs Lab 10/12/16 1313 10/15/16 0452 10/15/16 1343 10/16/16 0522  NA 140 142 137 137  K 4.1 3.7 4.2 3.6  CL 109 111 107 104  CO2 24 22 22 26   GLUCOSE 89 80 79 82  BUN 16 16 14 14   CREATININE 1.05* 0.88 0.85 0.86  CALCIUM 9.2 9.1 9.7 9.1  MG  --  2.3  --   --    Liver Function Tests:  Recent Labs Lab 10/12/16 1313 10/15/16 0452 10/15/16 1343 10/16/16 0522  AST 20 19 19 19   ALT 17 16 17 17   ALKPHOS 50 50 55 50  BILITOT 0.8 0.6 0.5 0.8  PROT 7.4 7.3 7.5 7.2  ALBUMIN 4.2 4.2 4.1 3.8    Recent Labs Lab 10/12/16 1313 10/15/16 0453  LIPASE 39 23   No results for input(s): AMMONIA in the last 168 hours. CBC:  Recent Labs Lab 10/12/16 1313 10/15/16 0452 10/16/16 0522  WBC 6.7 6.4 6.8  HGB 12.5 12.0 11.8*  HCT 38.4 36.6 36.3  MCV 82.9 81.5 82.7  PLT 265 274 286   Cardiac Enzymes:  Recent Labs Lab 10/15/16 0452 10/15/16 1343 10/15/16 1922  TROPONINI <0.03 <0.03 <0.03   BNP: Invalid input(s): POCBNP CBG:  Recent Labs Lab 10/15/16 0506  GLUCAP 92   D-Dimer No results for input(s): DDIMER in the last 72 hours. Hgb A1c No results for input(s): HGBA1C in the last 72 hours. Lipid Profile No results for input(s): CHOL, HDL, LDLCALC, TRIG, CHOLHDL, LDLDIRECT in the last 72 hours. Thyroid function studies No results for input(s): TSH, T4TOTAL, T3FREE, THYROIDAB in the last 72 hours.  Invalid input(s): FREET3 Anemia work up  Recent Labs  10/15/16 0457  TIBC 498*  IRON 14*   Urinalysis    Component Value Date/Time   COLORURINE YELLOW 10/12/2016 1509   APPEARANCEUR CLEAR 10/12/2016 1509   LABSPEC 1.029 10/12/2016 1509   PHURINE 5.0 10/12/2016 1509   GLUCOSEU NEGATIVE 10/12/2016 1509   HGBUR NEGATIVE 10/12/2016 1509   BILIRUBINUR NEGATIVE 10/12/2016 1509   KETONESUR NEGATIVE 10/12/2016 1509   PROTEINUR NEGATIVE 10/12/2016  1509   NITRITE NEGATIVE 10/12/2016 1509   LEUKOCYTESUR NEGATIVE 10/12/2016 1509   Sepsis Labs Invalid input(s): PROCALCITONIN,  WBC,  LACTICIDVEN Microbiology No results found for this or any previous visit (from the past 240 hour(s)).   Time coordinating discharge: 40 minutes  SIGNED:  Noralee Stain, DO [facility] Pager [phone]  If 7PM-7AM, please contact night-coverage [email or web address] Password [facility] 10/17/2016, 12:33 PM

## 2016-10-17 NOTE — Progress Notes (Addendum)
[facility] called for transport,ETA.--2:35pm

## 2016-10-18 DIAGNOSIS — F1410 Cocaine abuse, uncomplicated: Secondary | ICD-10-CM

## 2016-10-18 DIAGNOSIS — F1010 Alcohol abuse, uncomplicated: Secondary | ICD-10-CM

## 2016-10-18 LAB — HEPATIC FUNCTION PANEL
ALK PHOS: 43 U/L (ref 38–126)
ALT: 16 U/L (ref 14–54)
AST: 16 U/L (ref 15–41)
Albumin: 4 g/dL (ref 3.5–5.0)
BILIRUBIN TOTAL: 0.2 mg/dL — AB (ref 0.3–1.2)
Total Protein: 7.2 g/dL (ref 6.5–8.1)

## 2016-10-18 MED ORDER — SERTRALINE HCL 50 MG PO TABS
50.0000 mg | ORAL_TABLET | Freq: Every day | ORAL | Status: DC
  Administered 2016-10-19 – 2016-10-21 (×3): 50 mg via ORAL
  Filled 2016-10-18 (×5): qty 1

## 2016-10-18 MED ORDER — NALTREXONE HCL 50 MG PO TABS
50.0000 mg | ORAL_TABLET | Freq: Every day | ORAL | Status: DC
  Administered 2016-10-18 – 2016-10-21 (×4): 50 mg via ORAL
  Filled 2016-10-18 (×7): qty 1

## 2016-10-18 MED ORDER — PANTOPRAZOLE SODIUM 40 MG PO TBEC
40.0000 mg | DELAYED_RELEASE_TABLET | Freq: Every day | ORAL | Status: DC
  Administered 2016-10-18 – 2016-10-21 (×4): 40 mg via ORAL
  Filled 2016-10-18 (×7): qty 1

## 2016-10-18 MED ORDER — LORATADINE 10 MG PO TABS
10.0000 mg | ORAL_TABLET | Freq: Every day | ORAL | Status: DC
  Administered 2016-10-18 – 2016-10-21 (×4): 10 mg via ORAL
  Filled 2016-10-18 (×7): qty 1
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
## 2016-10-18 NOTE — Progress Notes (Signed)
Nursing Note 10/18/2016 [id number]  Data Reports sleeping fair with PRN sleep med.  Rates depression 6/10, hopelessness 0/10, and anxiety 8/10. Affect wide ranged and appropriate. Denies HI, SI, AVH.  Ongoing pain in right side, NP aware.  C/O nasal congestion/allergies  Action Spoke with patient 1:1, nurse offered support to patient throughout shift.  Orders received for side pain/nasal congestion (protonix and loratadine.)  Continues to be monitored on 15 minute checks for safety.  Response Remains safe and appropriate on unit.

## 2016-10-18 NOTE — [facility] Group Notes (Signed)
[facility] LCSW Group Therapy Note  10/18/2016 at 10:00 AM  Type of Therapy and Topic:  Group Therapy: Avoiding Self-Sabotaging and Enabling Behaviors  Participation Level: Did Not Attend; invited to participate yet did not despite overhead announcement and encouragement by staff   Catherine C Harrill, LCSW    

## 2016-10-18 NOTE — H&P (Signed)
Psychiatric Admission Assessment Adult  Patient Identification: Shirley Murphy MRN:  [id number] Date of Evaluation:  10/18/2016 Chief Complaint:  MDD POLYSUBSTANCE ABUSE Principal Diagnosis: MDD (major depressive disorder), recurrent severe, without psychosis (HCC) Diagnosis:   Patient Active Problem List   Diagnosis Date Noted  . MDD (major depressive disorder), recurrent severe, without psychosis (HCC) [F33.2] 10/17/2016  . Suicide attempt [T14.91XA] 10/15/2016  . HTN (hypertension), benign [I10] 10/15/2016  . Irregular heart beat [I49.9] 10/15/2016  . Suicidal ideation [R45.851] 10/15/2016  . Alcoholic intoxication without complication (HCC) [F10.920]    History of Present Illness:Per Ed assessment note-Shirley Murphy is a 31 year old female with a past medical history of depression, previous psychiatric hospitalization for suicidal ideation, alcohol dependence, admits to drinking on a regular basis who was brought in via EMS for attempted suicide with 8 tablets of Norvasc, cocaine and alcohol. Patient has been depressed because of her social issues, she has been separated from her children. She currently lives with her sister. After overdosing she called suicide helpline, who called EMS. She admits to drinking hard liquor on a daily basis. She was admitted due to suicidal ideation. Psychiatry was also consulted. Associated Signs/Symptoms: Depression Symptoms:  depressed mood, suicidal thoughts with specific plan, (Hypo) Manic Symptoms:  Distractibility, Irritable Mood, Anxiety Symptoms:  Excessive Worry, Psychotic Symptoms:  Hallucinations: Visual None PTSD Symptoms: Avoidance:  None Total Time spent with patient: 30 minutes  Past Psychiatric History:  Is the patient at risk to self? Yes.    Has the patient been a risk to self in the past 6 months? Yes.    Has the patient been a risk to self within the distant past? Yes.    Is the patient a risk to others? No.  Has the  patient been a risk to others in the past 6 months? No.  Has the patient been a risk to others within the distant past? No.   Prior Inpatient Therapy:   Prior Outpatient Therapy:    Alcohol Screening: Patient refused Alcohol Screening Tool: Yes 1. How often do you have a drink containing alcohol?: 4 or more times a week 2. How many drinks containing alcohol do you have on a typical day when you are drinking?: 10 or more 3. How often do you have six or more drinks on one occasion?: Daily or almost daily Preliminary Score: 8 4. How often during the last year have you found that you were not able to stop drinking once you had started?: Weekly 5. How often during the last year have you failed to do what was normally expected from you becasue of drinking?: Weekly 6. How often during the last year have you needed a first drink in the morning to get yourself going after a heavy drinking session?: Weekly 7. How often during the last year have you had a feeling of guilt of remorse after drinking?: Weekly 8. How often during the last year have you been unable to remember what happened the night before because you had been drinking?: Monthly 9. Have you or someone else been injured as a result of your drinking?: No 10. Has a relative or friend or a doctor or another health worker been concerned about your drinking or suggested you cut down?: Yes, during the last year Alcohol Use Disorder Identification Test Final Score (AUDIT): 30 Brief Intervention: Patient declined brief intervention Substance Abuse History in the last 12 months:  Yes.   Consequences of Substance Abuse: NA Previous Psychotropic Medications: YES Psychological Evaluations:  YES Past Medical History:  Past Medical History:  Diagnosis Date  . Chest pain   . Hepatitis C   . Hypertension   . Irregular heart beat     Past Surgical History:  Procedure Laterality Date  . c sections    . TUBAL LIGATION     Family History: History  reviewed. No pertinent family history. Family Psychiatric  History:  Tobacco Screening: Have you used any form of tobacco in the last 30 days? (Cigarettes, Smokeless Tobacco, Cigars, and/or Pipes): Yes Tobacco use, Select all that apply: 5 or more cigarettes per day Are you interested in Tobacco Cessation Medications?: No, patient refused Counseled patient on smoking cessation including recognizing danger situations, developing coping skills and basic information about quitting provided: Refused/Declined practical counseling Social History:  History  Alcohol Use No     History  Drug Use No    Additional Social History: Marital status: Separated Separated, when?: Since 2011 What types of issues is patient dealing with in the relationship?: No contact Additional relationship information: Has a boyfriend now, no abuse, has been with him about 8 months. Are you sexually active?: Yes What is your sexual orientation?: Bi-sexual Does patient have children?: Yes How many children?: 3 How is patient's relationship with their children?: All have been adopted - oldest one on 10/01/16 was final.  More open with the oldest child because they will send pictures/videos, but only allowed to call once a year to find out how younger two are doing.                         Allergies:   Allergies  Allergen Reactions  . Other     Benzole peroxide   Lab Results: No results found for this or any previous visit (from the past 48 hour(s)).  Blood Alcohol level:  Lab Results  Component Value Date   ETH 236 (H) 10/15/2016   ETH <5 10/12/2016    Metabolic Disorder Labs:  No results found for: HGBA1C, MPG No results found for: PROLACTIN No results found for: CHOL, TRIG, HDL, CHOLHDL, VLDL, LDLCALC  Current Medications: Current Facility-Administered Medications  Medication Dose Route Frequency Provider Last Rate Last Dose  . acetaminophen (TYLENOL) tablet 650 mg  650 mg Oral Q6H PRN Laveda Abbe, NP      . alum & mag hydroxide-simeth (MAALOX/MYLANTA) 200-200-20 MG/5ML suspension 30 mL  30 mL Oral Q4H PRN Laveda Abbe, NP      . hydrOXYzine (ATARAX/VISTARIL) tablet 25 mg  25 mg Oral Q6H PRN Laveda Abbe, NP   25 mg at 10/17/16 2117  . loperamide (IMODIUM) capsule 2-4 mg  2-4 mg Oral PRN Laveda Abbe, NP      . loratadine (CLARITIN) tablet 10 mg  10 mg Oral Daily Sanjuana Kava, NP   10 mg at 10/18/16 1202  . LORazepam (ATIVAN) tablet 1 mg  1 mg Oral Q6H PRN Laveda Abbe, NP   1 mg at 10/17/16 2145  . LORazepam (ATIVAN) tablet 1 mg  1 mg Oral TID Laveda Abbe, NP       Followed by  . [START ON 10/19/2016] LORazepam (ATIVAN) tablet 1 mg  1 mg Oral BID Laveda Abbe, NP       Followed by  . [START ON 10/21/2016] LORazepam (ATIVAN) tablet 1 mg  1 mg Oral Daily Laveda Abbe, NP      . LORazepam (ATIVAN) tablet  1 mg  1 mg Oral Once PRN Jackelyn PolingJason A Berry, NP      . magnesium hydroxide (MILK OF MAGNESIA) suspension 30 mL  30 mL Oral Daily PRN Laveda AbbeLaurie Britton Parks, NP      . multivitamin with minerals tablet 1 tablet  1 tablet Oral Daily Laveda AbbeLaurie Britton Parks, NP   1 tablet at 10/18/16 0804  . naltrexone (DEPADE) tablet 50 mg  50 mg Oral Daily Georgiann CockerVincent A Izediuno, MD   50 mg at 10/18/16 1309  . nicotine (NICODERM CQ - dosed in mg/24 hours) patch 21 mg  21 mg Transdermal Daily Laveda AbbeLaurie Britton Parks, NP   21 mg at 10/18/16 0803  . ondansetron (ZOFRAN-ODT) disintegrating tablet 4 mg  4 mg Oral Q6H PRN Laveda AbbeLaurie Britton Parks, NP      . pantoprazole (PROTONIX) EC tablet 40 mg  40 mg Oral Daily Sanjuana KavaAgnes I Nwoko, NP   40 mg at 10/18/16 1202  . [START ON 10/19/2016] sertraline (ZOLOFT) tablet 50 mg  50 mg Oral Daily Vincent A Izediuno, MD      . thiamine (VITAMIN B-1) tablet 100 mg  100 mg Oral Daily Laveda AbbeLaurie Britton Parks, NP   100 mg at 10/18/16 0804  . traZODone (DESYREL) tablet 50 mg  50 mg Oral QHS,MR X 1 Jackelyn PolingJason A Berry, NP       PTA  Medications: Prescriptions Prior to Admission  Medication Sig Dispense Refill Last Dose  . sertraline (ZOLOFT) 25 MG tablet Take 1 tablet (25 mg total) by mouth daily.       Musculoskeletal: Strength & Muscle Tone: within normal limits Gait & Station: normal Patient leans: N/A  Psychiatric Specialty Exam: Physical Exam  Nursing note and vitals reviewed. Constitutional: She is oriented to person, place, and time. She appears well-developed.  Neurological: She is alert and oriented to person, place, and time.  Psychiatric: She has a normal mood and affect. Her behavior is normal.    Review of Systems  Psychiatric/Behavioral: Positive for depression and substance abuse.    Blood pressure 124/81, pulse 88, temperature 97.5 F (36.4 C), temperature source Oral, resp. rate 18, height 5\' 1"  (1.549 m), weight 106.1 kg (234 lb), last menstrual period 10/07/2016, SpO2 100 %.Body mass index is 44.21 kg/m.  General Appearance: Casual and Guarded  Eye Contact:  Fair  Speech:  Clear and Coherent  Volume:  Normal  Mood:  Depressed  Affect:  Depressed and Flat  Thought Process:  Coherent  Orientation:  Full (Time, Place, and Person)  Thought Content:  Hallucinations: None  Suicidal Thoughts:  Yes.  with intent/plan  Homicidal Thoughts:  No  Memory:  Immediate;   Fair Recent;   Fair Remote;   Fair  Judgement:  Fair  Insight:  Fair  Psychomotor Activity:  Restlessness  Concentration:  Concentration: Poor  Recall:  [employer]  Fund of Knowledge:  Fair  Language:  Good  Akathisia:  No  Handed:  Right  AIMS (if indicated):     Assets:  Communication Skills Intimacy Physical Health  ADL's:  Intact  Cognition:  WNL  Sleep:  Number of Hours: 6     I agree with current treatment plan on 10/18/2016, Patient seen face-to-face for psychiatric evaluation follow-up, chart reviewed. Reviewed the information documented and agree with the treatment plan.  Treatment Plan Summary: Daily contact  with patient to assess and evaluate symptoms and progress in treatment and Medication management   Per MD Plan listed on SRA: 1. Alcohol withdrawal protocol  2. Sertraline to target depression 3. Naltrexone 50 mg daily to target craving 4. Encourage unit groups and activities 5. Monitor mood, behavior and interaction with peers 6. Motivational enhancement  7. Collateral from her family  Observation Level/Precautions:  15 minute checks  Laboratory:  CBC Chemistry Profile UDS UA  Psychotherapy: individual and group session   Medications:  See above  Consultations:  psychiatry  Discharge Concerns:  Safety, stabilization, and risk of access to medication and medication stabilization   Estimated LOS:5-7days  Other:     Physician Treatment Plan for Primary Diagnosis: MDD (major depressive disorder), recurrent severe, without psychosis (HCC) Long Term Goal(s): Improvement in symptoms so as ready for discharge  Short Term Goals: Ability to identify changes in lifestyle to reduce recurrence of condition will improve, Ability to verbalize feelings will improve, Ability to disclose and discuss suicidal ideas and Ability to demonstrate self-control will improve  Physician Treatment Plan for Secondary Diagnosis: Principal Problem:   MDD (major depressive disorder), recurrent severe, without psychosis (HCC)  Long Term Goal(s): Improvement in symptoms so as ready for discharge  Short Term Goals: Ability to demonstrate self-control will improve, Ability to identify and develop effective coping behaviors will improve and Compliance with prescribed medications will improve  I certify that inpatient services furnished can reasonably be expected to improve the patient's condition.    Oneta Rack, NP 3/3/20184:56 PM

## 2016-10-18 NOTE — [facility] Group Notes (Signed)
[facility] Group Notes:  (Nursing/MHT/Case Management/Adjunct)  Date:  10/18/2016  Time:  4:17 PM  Type of Therapy:  Nurse Education  Participation Level:  Active  Participation Quality:  Appropriate  Affect:  Appropriate  Cognitive:  Appropriate  Insight:  Appropriate  Engagement in Group:  Engaged  Modes of Intervention:  Discussion  Summary of Progress/Problems:  Education group regarding communication styles. Patient was able to identify her style as- "Passive aggressive/assertive."    Almira Barenny G Lianni Kanaan 10/18/2016, 4:17 PM

## 2016-10-18 NOTE — [facility] Suicide Risk Assessment (Addendum)
[facility] Admission Suicide Risk Assessment   Nursing information obtained from:    Demographic factors:    Current Mental Status:    Loss Factors:    Historical Factors:    Risk Reduction Factors:     Total Time spent with patient: 45 minutes Principal Problem: MDD (major depressive disorder), recurrent severe, without psychosis (HCC) Diagnosis:   Patient Active Problem List   Diagnosis Date Noted  . MDD (major depressive disorder), recurrent severe, without psychosis (HCC) [F33.2] 10/17/2016  . Suicide attempt [T14.91XA] 10/15/2016  . HTN (hypertension), benign [I10] 10/15/2016  . Irregular heart beat [I49.9] 10/15/2016  . Suicidal ideation [R45.851] 10/15/2016  . Alcoholic intoxication without complication (HCC) [F10.920]    Subjective Data:  31 yo Caucasian female, single, works as a Designer, fashion/clothing, lives with her sister, transferred from the medical floor. Medical admit was in context of an overdose. Patient took 8 pills of her antihypertensive medication. She was intoxicated with cocaine and alcohol at that time. She has been overwhelmed because she had been separated from her kids.   Patient reports long history of substance use. Used amphetamines, and alcohol primarily. Started using alongside her mom at the age of 13 years. She has significant substance related legal issues. Says when she came out of jail in 2014, she lost custody of her children. She has three children aged 53,8 and 10 years respectively. Says she had been involved in a legal battle so as to regain custody. Says did everything that was recommended. Say a year ago, she was disappointed that the judge determined to terminate her parental rights. Patient states that she had coped by drinking more. She drinks daily. Says the final straw was after she viewed a video of her 30 year old during last christmas. Says she was broken and has been feeling guilty. Patient says she drank more and felt more guilty. She overdosed because she  could not take it anymore. At that time had death wish. Says she is still very down and worthless. She has past history of alcoholic liver failure and HCV. Says she has been having abdominal pain which is likely related to these. Patient reports two previous serious overdose. She was treated in ICU on those two other occassions.  No associated psychosis. No associated mania. No thoughts of harming anyone else. No current preoccupation with dying. No access to weapons.     Continued Clinical Symptoms:  Alcohol Use Disorder Identification Test Final Score (AUDIT): 30 The "Alcohol Use Disorders Identification Test", Guidelines for Use in Primary Care, Second Edition.  World Science writer [facility]). Score between 0-7:  no or low risk or alcohol related problems. Score between 8-15:  moderate risk of alcohol related problems. Score between 16-19:  high risk of alcohol related problems. Score 20 or above:  warrants further diagnostic evaluation for alcohol dependence and treatment.   CLINICAL FACTORS:  Depression Hopelessness Lots of guilt   SUD Chronic medical issues Loss of parental rights Repeated serious suicidal attempts Legal issues ,,,, court dates 10/22/16 and 11/08/16    Musculoskeletal: Strength & Muscle Tone: within normal limits Gait & Station: normal Patient leans: N/A  Psychiatric Specialty Exam: Physical Exam  Constitutional: She is oriented to person, place, and time. She appears well-developed and well-nourished.  HENT:  Head: Normocephalic and atraumatic.  Eyes: Conjunctivae are normal. Pupils are equal, round, and reactive to light.  Neck: Normal range of motion. Neck supple.  Cardiovascular: Normal rate and regular rhythm.   Respiratory: Effort normal  and breath sounds normal.  GI: Soft. Bowel sounds are normal.  Musculoskeletal: Normal range of motion.  Neurological: She is alert and oriented to person, place, and time. She has normal reflexes.  Skin: Skin  is warm and dry.  Psychiatric:  As above    ROS  Blood pressure 113/82, pulse 93, temperature 97.5 F (36.4 C), temperature source Oral, resp. rate 18, height 5\' 1"  (1.549 m), weight 106.1 kg (234 lb), last menstrual period 10/07/2016, SpO2 100 %.Body mass index is 44.21 kg/m.  General Appearance: Obese, psychomotor retardation. Moderate rapport. No evidence of withdrawals. Appropriate and does not appear internally distracted.   Eye Contact:  Moderate  Speech:  Soft spoken. some latency  Volume:  Decreased  Mood:  Depressed and Hopeless  Affect:  Blunt and Congruent  Thought Process:  Slow speed of thought. Logical and linear.  Orientation:  Full (Time, Place, and Person)  Thought Content:  Negative guiltyruminations. No delusions. No hallucination in any modality.   Suicidal Thoughts:  Yes.  without intent/plan  Homicidal Thoughts:  No  Memory:  Immediate;   Fair Recent;   Fair Remote;   Fair  Judgement:  Poor  Insight:  Shallow  Psychomotor Activity:  Decreased  Concentration:  Concentration: Fair and Attention Span: Fair  Recall:  [employer]Fair  Fund of Knowledge:  Fair  Language:  Good  Akathisia:  No  Handed:    AIMS (if indicated):     Assets:  Financial Resources/Insurance Vocational/Educational  ADL's:  Impaired  Cognition:  WNL  Sleep:  Number of Hours: 6      COGNITIVE FEATURES THAT CONTRIBUTE TO RISK:  Closed-mindedness    SUICIDE RISK:   Severe:  Frequent, intense, and enduring suicidal ideation, specific plan, no subjective intent, but some objective markers of intent (i.e., choice of lethal method), the method is accessible, some limited preparatory behavior, evidence of impaired self-control, severe dysphoria/symptomatology, multiple risk factors present, and few if any protective factors, particularly a lack of social support.  PLAN OF CARE:  Severe depression in context of substance use disorder, legal issues and multiple psychosocial factors. Patient is at  high risk of suicide. We discussed management plan below. Patient consented to treatment after we reviewed the risks and benefits respectively.    Psychiatric: MDD severe without psychotic features SUD (cocaine, alcohol)  Medical: HTN HCV Acholic liver disease  Psychosocial:  Legal issues Separation from her kids.  Limited support.   PLAN: 1. Alcohol withdrawal protocol 2. Sertraline to target depression 3. Naltrexone 50 mg daily to target craving 4. Encourage unit groups and activities 5. Monitor mood, behavior and interaction with peers 6. Motivational enhancement  7. Collateral from her family.    I certify that inpatient services furnished can reasonably be expected to improve the patient's condition.   Georgiann CockerVincent A Izediuno, MD 10/18/2016, 11:27 AM

## 2016-10-19 NOTE — [facility] Group Notes (Signed)
[facility] LCSW Group Therapy Note   10/19/2016 at 10 AM   Type of Therapy and Topic: Group Therapy: Feelings Around Returning Home & Establishing a Supportive Framework and Activity to Identify signs of Improvement or Decompensation   Participation Level: Active   Description of Group:  Patients first processed thoughts and feelings about up coming discharge. These included fears of upcoming changes, lack of change, new living environments, judgements and expectations from others and overall stigma of MH issues. We then discussed what is a supportive framework? What does it look like feel like and how do I discern it from and unhealthy non-supportive network? Learn how to cope when supports are not helpful and don't support you. Discuss what to do when your family/friends are not supportive.   Therapeutic Goals Addressed in Processing Group:  1. Patient will identify one healthy supportive network that they can use at discharge. 2. Patient will identify one factor of a supportive framework and how to tell it from an unhealthy network. 3. Patient able to identify one coping skill to use when they do not have positive supports from others. 4. Patient will demonstrate ability to communicate their needs through discussion and/or role plays.  Summary of Patient Progress:  Pt engaged easily during group session and easily processed her anxiety about discharge as "these last few days are the only days in about six years I haven't been drunk." Patient chose a visual to represent decompensation as boose and improvement as a fresh start away from family. Patient exhibited readiness for change yet acknowledges it will be difficult.   Shirley Bernatherine C Aerica Rincon, LCSW

## 2016-10-19 NOTE — Progress Notes (Signed)
Patient ID: Shirley Murphy, female   DOB: [birth year], 31 y.o.   MRN: [id number]   Pt attended and engaged in evening AA group.

## 2016-10-19 NOTE — Progress Notes (Signed)
D.  Pt pleasant on approach, some anxiety noted.  Pt was positive for evening AA group, interacting appropriately with peers on the unit.  Pt denies SI/HI/hallucinations at this time.  Pt reports feeling better this evening than she did this morning.  A.  Support and encouragement offered, medication given as ordered  R.  Pt remains safe on the unit, will continue to monitor.

## 2016-10-19 NOTE — Progress Notes (Signed)
[facility] MD Progress Note  10/19/2016 9:44 AM Shirley Murphy  MRN:  [id number] Subjective:   31 yo Caucasian female, single, works as a Designer, fashion/clothing, lives with her sister, transferred from the medical floor. Medical admit was in context of an overdose. Patient took 8 pills of her antihypertensive medication. She was intoxicated with cocaine and alcohol at that time. She has been overwhelmed because she had been separated from her kids.  Nursing staff reports that she has been calm and cooperative. She has been engaging with unit groups and activities. She has not reported any withdrawal symptoms. Vitals ans CIWA scores has been normal. She has not been observed to be internally stimulated. No behavioral issues. She has been tolerating her medications well.  Seen today, says she is having a great day. She is happy and hopeful. Says she feels good off alcohol. Her mind is clearer. She is not shaky or sweaty. No headache or nausea. Patient says she is still craving for alcohol. We discussed the effect of alcohol on her mood. Patient understands it is a depressant. Says she hopes to work on staying sober. She plans to attend groups today.  She reports good sleep, good energy and appetite. No perceptual abnormality. No thoughts violence towards self or others. She is able to think clearly. No evidence of mania. Says she used to be on 100 mg Sertraline. Advised we would gradually titrate as needed.  Principal Problem: MDD (major depressive disorder), recurrent severe, without psychosis (HCC) Diagnosis:   Patient Active Problem List   Diagnosis Date Noted  . MDD (major depressive disorder), recurrent severe, without psychosis (HCC) [F33.2] 10/17/2016  . Suicide attempt [T14.91XA] 10/15/2016  . HTN (hypertension), benign [I10] 10/15/2016  . Irregular heart beat [I49.9] 10/15/2016  . Suicidal ideation [R45.851] 10/15/2016  . Alcoholic intoxication without complication (HCC) [F10.920]    Total Time spent with  patient: 20 minutes  Past Psychiatric History: As in H&P  Past Medical History:  Past Medical History:  Diagnosis Date  . Chest pain   . Hepatitis C   . Hypertension   . Irregular heart beat     Past Surgical History:  Procedure Laterality Date  . c sections    . TUBAL LIGATION     Family History: History reviewed. No pertinent family history. Family Psychiatric  History: As in H&P  Social History:  History  Alcohol Use No     History  Drug Use No    Social History   Social History  . Marital status: Legally Separated    Spouse name: N/A  . Number of children: N/A  . Years of education: N/A   Social History Main Topics  . Smoking status: Current Every Day Smoker    Packs/day: 1.00    Types: Cigarettes  . Smokeless tobacco: Never Used  . Alcohol use No  . Drug use: No  . Sexual activity: Not Asked   Other Topics Concern  . None   Social History Narrative  . None   Additional Social History:   Sleep: Good  Appetite:  Good  Current Medications: Current Facility-Administered Medications  Medication Dose Route Frequency Provider Last Rate Last Dose  . acetaminophen (TYLENOL) tablet 650 mg  650 mg Oral Q6H PRN Laveda Abbe, NP      . alum & mag hydroxide-simeth (MAALOX/MYLANTA) 200-200-20 MG/5ML suspension 30 mL  30 mL Oral Q4H PRN Laveda Abbe, NP      . hydrOXYzine (ATARAX/VISTARIL) tablet 25 mg  25  mg Oral Q6H PRN Laveda Abbe, NP   25 mg at 10/19/16 0746  . loperamide (IMODIUM) capsule 2-4 mg  2-4 mg Oral PRN Laveda Abbe, NP      . loratadine (CLARITIN) tablet 10 mg  10 mg Oral Daily Sanjuana Kava, NP   10 mg at 10/19/16 0740  . LORazepam (ATIVAN) tablet 1 mg  1 mg Oral Q6H PRN Laveda Abbe, NP   1 mg at 10/17/16 2145  . LORazepam (ATIVAN) tablet 1 mg  1 mg Oral TID Laveda Abbe, NP   1 mg at 10/19/16 0741   Followed by  . LORazepam (ATIVAN) tablet 1 mg  1 mg Oral BID Laveda Abbe, NP        Followed by  . [START ON 10/21/2016] LORazepam (ATIVAN) tablet 1 mg  1 mg Oral Daily Laveda Abbe, NP      . LORazepam (ATIVAN) tablet 1 mg  1 mg Oral Once PRN Jackelyn Poling, NP      . magnesium hydroxide (MILK OF MAGNESIA) suspension 30 mL  30 mL Oral Daily PRN Laveda Abbe, NP      . multivitamin with minerals tablet 1 tablet  1 tablet Oral Daily Laveda Abbe, NP   1 tablet at 10/19/16 0740  . naltrexone (DEPADE) tablet 50 mg  50 mg Oral Daily Georgiann Cocker, MD   50 mg at 10/19/16 0740  . nicotine (NICODERM CQ - dosed in mg/24 hours) patch 21 mg  21 mg Transdermal Daily Laveda Abbe, NP   21 mg at 10/19/16 0743  . ondansetron (ZOFRAN-ODT) disintegrating tablet 4 mg  4 mg Oral Q6H PRN Laveda Abbe, NP      . pantoprazole (PROTONIX) EC tablet 40 mg  40 mg Oral Daily Sanjuana Kava, NP   40 mg at 10/19/16 0740  . sertraline (ZOLOFT) tablet 50 mg  50 mg Oral Daily Georgiann Cocker, MD   50 mg at 10/19/16 0740  . thiamine (VITAMIN B-1) tablet 100 mg  100 mg Oral Daily Laveda Abbe, NP   100 mg at 10/19/16 0740  . traZODone (DESYREL) tablet 50 mg  50 mg Oral QHS,MR X 1 Jackelyn Poling, NP        Lab Results:  Results for orders placed or performed during the hospital encounter of 10/17/16 (from the past 48 hour(s))  Hepatic function panel     Status: Abnormal   Collection Time: 10/18/16  6:10 PM  Result Value Ref Range   Total Protein 7.2 6.5 - 8.1 g/dL   Albumin 4.0 3.5 - 5.0 g/dL   AST 16 15 - 41 U/L   ALT 16 14 - 54 U/L   Alkaline Phosphatase 43 38 - 126 U/L   Total Bilirubin 0.2 (L) 0.3 - 1.2 mg/dL   Bilirubin, Direct <4.0 (L) 0.1 - 0.5 mg/dL   Indirect Bilirubin NOT CALCULATED 0.3 - 0.9 mg/dL    Comment: Performed at [facility], [address], [place], [place] [address]    Blood Alcohol level:  Lab Results  Component Value Date   ETH 236 (H) 10/15/2016   ETH <5 10/12/2016    Metabolic Disorder Labs: No  results found for: HGBA1C, MPG No results found for: PROLACTIN No results found for: CHOL, TRIG, HDL, CHOLHDL, VLDL, LDLCALC  Physical Findings: AIMS: Facial and Oral Movements Muscles of Facial Expression: None, normal Lips and Perioral Area: None, normal  Jaw: None, normal Tongue: None, normal,Extremity Movements Upper (arms, wrists, hands, fingers): None, normal Lower (legs, knees, ankles, toes): None, normal, Trunk Movements Neck, shoulders, hips: None, normal, Overall Severity Severity of abnormal movements (highest score from questions above): None, normal Incapacitation due to abnormal movements: None, normal Patient's awareness of abnormal movements (rate only patient's report): No Awareness, Dental Status Current problems with teeth and/or dentures?: No Does patient usually wear dentures?: No  CIWA:  CIWA-Ar Total: 2 COWS:  COWS Total Score: 5  Musculoskeletal: Strength & Muscle Tone: within normal limits Gait & Station: normal Patient leans: N/A  Psychiatric Specialty Exam: Physical Exam  ROS  Blood pressure 122/68, pulse 85, temperature 97.7 F (36.5 C), temperature source Oral, resp. rate 18, height 5\' 1"  (1.549 m), weight 106.1 kg (234 lb), last menstrual period 10/07/2016, SpO2 100 %.Body mass index is 44.21 kg/m.  General Appearance: Neatly dressed, pleasant, engaging well and cooperative. Appropriate behavior. Not in any distress. Good relatedness. Not internally stimulated. No evidence of withdrawals.  Eye Contact:  Good  Speech:  Spontaneous, normal prosody. Normal tone and rate.   Volume:  Normal  Mood:  Euthymic  Affect:  Appropriate and Full Range  Thought Process:  Coherent and Goal Directed  Orientation:  Full (Time, Place, and Person)  Thought Content:  No delusional theme. No preoccupation with violent thoughts. No negative ruminations. No obsession.  No hallucination in any modality.   Suicidal Thoughts:  No  Homicidal Thoughts:  No  Memory:   Immediate;   Good Recent;   Good Remote;   Good  Judgement:  Good  Insight:  Partial  Psychomotor Activity:  Normal  Concentration:  Concentration: Good and Attention Span: Good  Recall:  Good  Fund of Knowledge:  Good  Language:  Good  Akathisia:  No  Handed:    AIMS (if indicated):     Assets:  Communication Skills Desire for Improvement Financial Resources/Insurance Physical Health Resilience  ADL's:  Intact  Cognition:  WNL  Sleep:  Number of Hours: 6.75     Treatment Plan Summary:  Patient is coming off alcohol smoothly. Her mood has rapidly improved. She is still craving for alcohol. No dangerous thoughts at this time. We are still adjusting her medications.  Psychiatric: MDD severe without psychotic features SUD (cocaine, alcohol)  Medical: HTN HCV Acholic liver disease  Psychosocial:  Legal issues Separation from her kids.  Limited support.   PLAN: 1. Sertraline was increased to 50 mg daily today 2. Continue CIWA protocol 3. Encourage unit groups and activities 4. Continue to monitor mood, behavior and interaction with peers 5. Motivational enhancement  6. SW would coordinate aftercare.   Georgiann CockerVincent A Izediuno, MD 10/19/2016, 9:44 AM

## 2016-10-19 NOTE — [facility] Group Notes (Signed)
[facility] Group Notes:  (Nursing/MHT/Case Management/Adjunct)  Date:  10/19/2016  Time:  3:28 PM  Type of Therapy:  Nurse Education  Participation Level:  Active  Participation Quality:  Appropriate  Affect:  Appropriate  Cognitive:  Appropriate  Insight:  Appropriate  Engagement in Group:  Engaged  Modes of Intervention:  Discussion and Education  Summary of Progress/Problems:  During group self care was discussed, including how to incorporate essential oils.    Almira Barenny G Zurich Carreno 10/19/2016, 3:28 PM

## 2016-10-19 NOTE — Progress Notes (Signed)
D: Patient's self inventory sheet: patient has fair sleep, with no  sleep medication.good  Appetite, normal energy level, good concentration. Rated depression 5/10, hopeless 3/10, anxiety 4/10. SI/HI/AVH: denies all. Physical complaints are right side pain. Goal is "to start loving myself". Plans to work on "tell myself I"m important and I deserve more".   A: Medications administered, assessed medication knowledge and education given on medication regimen.  Emotional support and encouragement given patient. R: Denies SI and HI , contracts for safety. Safety maintained with 15 minute checks.

## 2016-10-19 NOTE — Plan of Care (Signed)
Problem: Activity: Goal: Interest or engagement in activities will improve Outcome: Progressing Pt has attended AA groups on unit

## 2016-10-19 NOTE — Progress Notes (Signed)
D.  Pt pleasant but anxious on approach, states she has just been feeling poorly today.  Pt did not feel well enough to attend evening AA group.  Pt went to bed after receiving medication for withdrawal symptoms.  Pt denies SI/HI/hallucinations at this time.  A.  Support and encouragement offered, medication given as ordered  R. Pt remains safe on the unit,will continue to monitor.

## 2016-10-20 ENCOUNTER — Encounter (HOSPITAL_COMMUNITY): Payer: Self-pay | Admitting: Psychiatry

## 2016-10-20 DIAGNOSIS — T461X2A Poisoning by calcium-channel blockers, intentional self-harm, initial encounter: Secondary | ICD-10-CM

## 2016-10-20 DIAGNOSIS — F332 Major depressive disorder, recurrent severe without psychotic features: Principal | ICD-10-CM

## 2016-10-20 DIAGNOSIS — Z79899 Other long term (current) drug therapy: Secondary | ICD-10-CM

## 2016-10-20 DIAGNOSIS — T1491XA Suicide attempt, initial encounter: Secondary | ICD-10-CM

## 2016-10-20 MED ORDER — QUETIAPINE FUMARATE 100 MG PO TABS
100.0000 mg | ORAL_TABLET | Freq: Once | ORAL | Status: AC
  Administered 2016-10-20: 100 mg via ORAL
  Filled 2016-10-20: qty 1

## 2016-10-20 MED ORDER — QUETIAPINE FUMARATE ER 50 MG PO TB24
100.0000 mg | ORAL_TABLET | Freq: Once | ORAL | Status: DC
  Filled 2016-10-20: qty 2

## 2016-10-20 MED ORDER — QUETIAPINE FUMARATE 100 MG PO TABS
100.0000 mg | ORAL_TABLET | Freq: Every day | ORAL | Status: DC
  Administered 2016-10-20: 100 mg via ORAL
  Filled 2016-10-20 (×3): qty 1

## 2016-10-20 NOTE — Progress Notes (Signed)
D: Patient reports poor sleep.  She wants to talk to the doctor about taking seroquel, which as worked for her well in the past.  Her appetite is fair; her energy level is normal and her concentration is good.  She denies any thoughts of self harm.  She report withdrawal symptoms as tremors, cravings, agitation, runny nose, chilling, cramping, nausea and irritability.  Patient completed ativan protocol today.  She rates her depression as a 5; hopelessness as a 3; anxiety as a 10.  She has anxious affect with pleasant mood.  She is attending groups and participating in her treatment. A: Continue to monitor medication management and MD orders.  Safety checks completed every 15 minutes per protocol. Offer support and encouragement as needed. R: Patient is receptive to staff; her behavior is appropriate.

## 2016-10-20 NOTE — Progress Notes (Signed)
Nursing Progress Note: 7p-7a D: Pt currently presents with a pleasant/anxious affect and behavior. Pt states "I really need help sleeping I haven't been sleeping that well recently. My Seroquel at home is 300 mg at least. Now, it's so low it's hard for me to sleep." Interacting appropriately with milieu. Pt reports off and on sleep with current medication regimen.   A: Pt provided with medications per providers orders. Pt's labs and vitals were monitored throughout the night. Pt supported emotionally and encouraged to express concerns and questions. Pt educated on medications.  R: Pt's safety ensured with 15 minute and environmental checks. Pt currently denies SI/HI/Self Harm and AVH. Pt verbally contracts to seek staff if SI/HI or A/VH occurs and to consult with staff before acting on any harmful thoughts. Will continue to monitor.

## 2016-10-20 NOTE — [facility] Group Notes (Signed)
[facility] LCSW Group Therapy  10/20/2016 11:34 AM  Type of Therapy:  Group Therapy  Participation Level:  Did Not Attend-pt invited. Chose to remain in bed.   Summary of Progress/Problems: Today's Topic: Overcoming Obstacles. Patients identified one short term goal and potential obstacles in reaching this goal. Patients processed barriers involved in overcoming these obstacles. Patients identified steps necessary for overcoming these obstacles and explored motivation (internal and external) for facing these difficulties head on.   Abby Tucholski N Smart LCSW 10/20/2016, 11:34 AM

## 2016-10-20 NOTE — Progress Notes (Signed)
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 Medical admit was in context of an overdose. Patient took 8 pills of her antihypertensive medication. She was intoxicated with cocaine and alcohol at that time. She has been overwhelmed because she had been separated from her kids.  Nursing staff reports that she has been calm and cooperative. She has been engaging with unit groups and activities. She has not reported any withdrawal symptoms. Vitals ans CIWA scores has been normal. She has not been observed to be internally stimulated. No behavioral issues. She has been tolerating her medications well.  On 3/4, says she is having a great day. She is happy and hopeful. Says she feels good off alcohol. Her mind is clearer. She is not shaky or sweaty. No headache or nausea. Patient says she is still craving for alcohol. We discussed the effect of alcohol on her mood. Patient understands it is a depressant. Says she hopes to work on staying sober. She plans to attend groups today.  She reports good sleep, good energy and appetite. No perceptual abnormality. No thoughts violence towards self or others. She is able to think clearly. No evidence of mania. Says she used to be on 100 mg Sertraline. Advised we would gradually titrate as needed.  Patient reports on 3/5 she is doing well except sleep. Trazodone does not help, and previously treated with seroquel and did not gain weight. No SI.  Principal Problem: MDD (major depressive disorder), recurrent severe, without psychosis (HCC) Diagnosis:   Patient Active Problem List   Diagnosis Date Noted  . MDD (major depressive disorder), recurrent severe, without psychosis (HCC) [F33.2] 10/17/2016  . Suicide attempt [T14.91XA] 10/15/2016  . HTN (hypertension), benign [I10] 10/15/2016  . Irregular heart beat [I49.9]  10/15/2016  . Suicidal ideation [R45.851] 10/15/2016  . Alcoholic intoxication without complication (HCC) [F10.920]    Total Time spent with patient: 20 minutes  Past Psychiatric History: As in H&P  Past Medical History:  Past Medical History:  Diagnosis Date  . Chest pain   . Hepatitis C   . Hypertension   . Irregular heart beat     Past Surgical History:  Procedure Laterality Date  . c sections    . TUBAL LIGATION     Family History: History reviewed. No pertinent family history. Family Psychiatric  History: As in H&P  Social History:  History  Alcohol Use No     History  Drug Use No    Social History   Social History  . Marital status: Legally Separated    Spouse name: N/A  . Number of children: N/A  . Years of education: N/A   Social History Main Topics  . Smoking status: Current Every Day Smoker    Packs/day: 1.00    Types: Cigarettes  . Smokeless tobacco: Never Used  . Alcohol use No  . Drug use: No  . Sexual activity: Not Asked   Other Topics Concern  . None   Social History Narrative  . None   Additional Social History:   Sleep: Fair  Appetite:  Good  Current Medications: Current Facility-Administered Medications  Medication Dose Route Frequency Provider Last Rate Last Dose  . acetaminophen (TYLENOL) tablet 650 mg  650 mg Oral Q6H PRN Laveda Abbe, NP   650 mg at 10/19/16 1304  . alum & mag  hydroxide-simeth (MAALOX/MYLANTA) 200-200-20 MG/5ML suspension 30 mL  30 mL Oral Q4H PRN Laveda Abbe, NP      . hydrOXYzine (ATARAX/VISTARIL) tablet 25 mg  25 mg Oral Q6H PRN Laveda Abbe, NP   25 mg at 10/19/16 2113  . loperamide (IMODIUM) capsule 2-4 mg  2-4 mg Oral PRN Laveda Abbe, NP      . loratadine (CLARITIN) tablet 10 mg  10 mg Oral Daily Sanjuana Kava, NP   10 mg at 10/20/16 0758  . LORazepam (ATIVAN) tablet 1 mg  1 mg Oral Q6H PRN Laveda Abbe, NP   1 mg at 10/20/16 0340  . [START ON 10/21/2016] LORazepam  (ATIVAN) tablet 1 mg  1 mg Oral Daily Laveda Abbe, NP      . magnesium hydroxide (MILK OF MAGNESIA) suspension 30 mL  30 mL Oral Daily PRN Laveda Abbe, NP      . multivitamin with minerals tablet 1 tablet  1 tablet Oral Daily Laveda Abbe, NP   1 tablet at 10/20/16 0758  . naltrexone (DEPADE) tablet 50 mg  50 mg Oral Daily Georgiann Cocker, MD   50 mg at 10/20/16 0759  . nicotine (NICODERM CQ - dosed in mg/24 hours) patch 21 mg  21 mg Transdermal Daily Laveda Abbe, NP   21 mg at 10/20/16 0805  . ondansetron (ZOFRAN-ODT) disintegrating tablet 4 mg  4 mg Oral Q6H PRN Laveda Abbe, NP      . pantoprazole (PROTONIX) EC tablet 40 mg  40 mg Oral Daily Sanjuana Kava, NP   40 mg at 10/20/16 0758  . sertraline (ZOLOFT) tablet 50 mg  50 mg Oral Daily Georgiann Cocker, MD   50 mg at 10/20/16 0758  . thiamine (VITAMIN B-1) tablet 100 mg  100 mg Oral Daily Laveda Abbe, NP   100 mg at 10/20/16 0800  . traZODone (DESYREL) tablet 50 mg  50 mg Oral QHS,MR X 1 Jackelyn Poling, NP   50 mg at 10/19/16 2205    Lab Results:  Results for orders placed or performed during the hospital encounter of 10/17/16 (from the past 48 hour(s))  Hepatic function panel     Status: Abnormal   Collection Time: 10/18/16  6:10 PM  Result Value Ref Range   Total Protein 7.2 6.5 - 8.1 g/dL   Albumin 4.0 3.5 - 5.0 g/dL   AST 16 15 - 41 U/L   ALT 16 14 - 54 U/L   Alkaline Phosphatase 43 38 - 126 U/L   Total Bilirubin 0.2 (L) 0.3 - 1.2 mg/dL   Bilirubin, Direct <1.6 (L) 0.1 - 0.5 mg/dL   Indirect Bilirubin NOT CALCULATED 0.3 - 0.9 mg/dL    Comment: Performed at [facility], [address], [place], [place] [address]    Blood Alcohol level:  Lab Results  Component Value Date   ETH 236 (H) 10/15/2016   ETH <5 10/12/2016    Metabolic Disorder Labs: No results found for: HGBA1C, MPG No results found for: PROLACTIN No results found for: CHOL, TRIG, HDL,  CHOLHDL, VLDL, LDLCALC  Physical Findings: AIMS: Facial and Oral Movements Muscles of Facial Expression: None, normal Lips and Perioral Area: None, normal Jaw: None, normal Tongue: None, normal,Extremity Movements Upper (arms, wrists, hands, fingers): None, normal Lower (legs, knees, ankles, toes): None, normal, Trunk Movements Neck, shoulders, hips: None, normal, Overall Severity Severity of abnormal movements (highest score from questions above):  None, normal Incapacitation due to abnormal movements: None, normal Patient's awareness of abnormal movements (rate only patient's report): No Awareness, Dental Status Current problems with teeth and/or dentures?: No Does patient usually wear dentures?: No  CIWA:  CIWA-Ar Total: 5 COWS:  COWS Total Score: 4  Musculoskeletal: Strength & Muscle Tone: within normal limits Gait & Station: normal Patient leans: N/A  Psychiatric Specialty Exam: Physical Exam  Nursing note and vitals reviewed.   ROS  Blood pressure (!) 136/103, pulse (!) 101, temperature 98.6 F (37 C), temperature source Oral, resp. rate 17, height 5\' 1"  (1.549 m), weight 106.1 kg (234 lb), last menstrual period 10/07/2016, SpO2 100 %.Body mass index is 44.21 kg/m.  General Appearance: Neatly dressed, pleasant, engaging well and cooperative. Appropriate behavior. Not in any distress. Good relatedness. Not internally stimulated. No evidence of withdrawals.  Eye Contact:  Good  Speech:  Spontaneous, normal prosody. Normal tone and rate.   Volume:  Normal  Mood:  Euthymic  Affect:  Appropriate and Full Range  Thought Process:  Coherent and Goal Directed  Orientation:  Full (Time, Place, and Person)  Thought Content:  No delusional theme. No preoccupation with violent thoughts. No negative ruminations. No obsession.  No hallucination in any modality.   Suicidal Thoughts:  No  Homicidal Thoughts:  No  Memory:  Immediate;   Good Recent;   Good Remote;   Good  Judgement:   Good  Insight:  Partial  Psychomotor Activity:  Normal  Concentration:  Concentration: Good and Attention Span: Good  Recall:  Good  Fund of Knowledge:  Good  Language:  Good  Akathisia:  No  Handed:    AIMS (if indicated):     Assets:  Communication Skills Desire for Improvement Financial Resources/Insurance Physical Health Resilience  ADL's:  Intact  Cognition:  WNL  Sleep:  Number of Hours: 6     Treatment Plan Summary:  Patient is coming off alcohol smoothly. Her mood has rapidly improved. Requests switch to seroquel for sleep. Planning discharge tomorrow, patient has court date on 3/7. No Si, no withdrawal symptoms.  Psychiatric: MDD severe without psychotic features SUD (cocaine, alcohol)  Medical: HTN HCV Acholic liver disease  Psychosocial:  Legal issues Separation from her kids.  Limited support.   PLAN: 1. Stop trazodone 2. start seroquel 100 mg po q hs 3. Continue CIWA protocol 4. Encourage unit groups and activities 5. Continue to monitor mood, behavior and interaction with peers 6. Motivational enhancement  7. SW would coordinate aftercare.  8. Discharge 3/6  Antonieta PertGreg Lawson Trask Vosler, MD 10/20/2016, 12:37 PMPatient ID: Shirley Murphy, female   DOB: 09/29/1985, 31 y.o.   MRN: [id number]

## 2016-10-20 NOTE — Tx Team (Signed)
Interdisciplinary Treatment and Diagnostic Plan Update  10/20/2016 Time of Session: 0930 Shirley Murphy MRN: [id number]  Principal Diagnosis: MDD (major depressive disorder), recurrent severe, without psychosis (HCC)  Secondary Diagnoses: Principal Problem:   MDD (major depressive disorder), recurrent severe, without psychosis (HCC)   Current Medications:  Current Facility-Administered Medications  Medication Dose Route Frequency Provider Last Rate Last Dose  . acetaminophen (TYLENOL) tablet 650 mg  650 mg Oral Q6H PRN Laveda AbbeLaurie Britton Parks, NP   650 mg at 10/19/16 1304  . alum & mag hydroxide-simeth (MAALOX/MYLANTA) 200-200-20 MG/5ML suspension 30 mL  30 mL Oral Q4H PRN Laveda AbbeLaurie Britton Parks, NP      . hydrOXYzine (ATARAX/VISTARIL) tablet 25 mg  25 mg Oral Q6H PRN Laveda AbbeLaurie Britton Parks, NP   25 mg at 10/19/16 2113  . loperamide (IMODIUM) capsule 2-4 mg  2-4 mg Oral PRN Laveda AbbeLaurie Britton Parks, NP      . loratadine (CLARITIN) tablet 10 mg  10 mg Oral Daily Sanjuana KavaAgnes I Nwoko, NP   10 mg at 10/20/16 0758  . LORazepam (ATIVAN) tablet 1 mg  1 mg Oral Q6H PRN Laveda AbbeLaurie Britton Parks, NP   1 mg at 10/20/16 0340  . [START ON 10/21/2016] LORazepam (ATIVAN) tablet 1 mg  1 mg Oral Daily Laveda AbbeLaurie Britton Parks, NP      . magnesium hydroxide (MILK OF MAGNESIA) suspension 30 mL  30 mL Oral Daily PRN Laveda AbbeLaurie Britton Parks, NP      . multivitamin with minerals tablet 1 tablet  1 tablet Oral Daily Laveda AbbeLaurie Britton Parks, NP   1 tablet at 10/20/16 0758  . naltrexone (DEPADE) tablet 50 mg  50 mg Oral Daily Georgiann CockerVincent A Izediuno, MD   50 mg at 10/20/16 0759  . nicotine (NICODERM CQ - dosed in mg/24 hours) patch 21 mg  21 mg Transdermal Daily Laveda AbbeLaurie Britton Parks, NP   21 mg at 10/20/16 0805  . ondansetron (ZOFRAN-ODT) disintegrating tablet 4 mg  4 mg Oral Q6H PRN Laveda AbbeLaurie Britton Parks, NP      . pantoprazole (PROTONIX) EC tablet 40 mg  40 mg Oral Daily Sanjuana KavaAgnes I Nwoko, NP   40 mg at 10/20/16 0758  . sertraline (ZOLOFT) tablet 50 mg   50 mg Oral Daily Georgiann CockerVincent A Izediuno, MD   50 mg at 10/20/16 0758  . thiamine (VITAMIN B-1) tablet 100 mg  100 mg Oral Daily Laveda AbbeLaurie Britton Parks, NP   100 mg at 10/20/16 0800  . traZODone (DESYREL) tablet 50 mg  50 mg Oral QHS,MR X 1 Jackelyn PolingJason A Berry, NP   50 mg at 10/19/16 2205   PTA Medications: Prescriptions Prior to Admission  Medication Sig Dispense Refill Last Dose  . sertraline (ZOLOFT) 25 MG tablet Take 1 tablet (25 mg total) by mouth daily.       Patient Stressors: Loss of children Substance abuse Traumatic event  Patient Strengths: Ability for insight Active sense of humor Capable of independent living Motivation for treatment/growth Work skills  Treatment Modalities: Medication Management, Group therapy, Case management,  1 to 1 session with clinician, Psychoeducation, Recreational therapy.   Physician Treatment Plan for Primary Diagnosis: MDD (major depressive disorder), recurrent severe, without psychosis (HCC) Long Term Goal(s): Improvement in symptoms so as ready for discharge Improvement in symptoms so as ready for discharge   Short Term Goals: Ability to identify changes in lifestyle to reduce recurrence of condition will improve Ability to verbalize feelings will improve Ability to disclose and discuss suicidal ideas Ability to demonstrate self-control  will improve Ability to demonstrate self-control will improve Ability to identify and develop effective coping behaviors will improve Compliance with prescribed medications will improve  Medication Management: Evaluate patient's response, side effects, and tolerance of medication regimen.  Therapeutic Interventions: 1 to 1 sessions, Unit Group sessions and Medication administration.  Evaluation of Outcomes: Progressing  Physician Treatment Plan for Secondary Diagnosis: Principal Problem:   MDD (major depressive disorder), recurrent severe, without psychosis (HCC)  Long Term Goal(s): Improvement in symptoms so  as ready for discharge Improvement in symptoms so as ready for discharge   Short Term Goals: Ability to identify changes in lifestyle to reduce recurrence of condition will improve Ability to verbalize feelings will improve Ability to disclose and discuss suicidal ideas Ability to demonstrate self-control will improve Ability to demonstrate self-control will improve Ability to identify and develop effective coping behaviors will improve Compliance with prescribed medications will improve     Medication Management: Evaluate patient's response, side effects, and tolerance of medication regimen.  Therapeutic Interventions: 1 to 1 sessions, Unit Group sessions and Medication administration.  Evaluation of Outcomes: Progressing   RN Treatment Plan for Primary Diagnosis: MDD (major depressive disorder), recurrent severe, without psychosis (HCC) Long Term Goal(s): Knowledge of disease and therapeutic regimen to maintain health will improve  Short Term Goals: Ability to remain free from injury will improve, Ability to verbalize feelings will improve and Ability to disclose and discuss suicidal ideas  Medication Management: RN will administer medications as ordered by provider, will assess and evaluate patient's response and provide education to patient for prescribed medication. RN will report any adverse and/or side effects to prescribing provider.  Therapeutic Interventions: 1 on 1 counseling sessions, Psychoeducation, Medication administration, Evaluate responses to treatment, Monitor vital signs and CBGs as ordered, Perform/monitor CIWA, COWS, AIMS and Fall Risk screenings as ordered, Perform wound care treatments as ordered.  Evaluation of Outcomes: Progressing   LCSW Treatment Plan for Primary Diagnosis: MDD (major depressive disorder), recurrent severe, without psychosis (HCC) Long Term Goal(s): Safe transition to appropriate next level of care at discharge, Engage patient in  therapeutic group addressing interpersonal concerns.  Short Term Goals: Engage patient in aftercare planning with referrals and resources, Facilitate patient progression through stages of change regarding substance use diagnoses and concerns and Identify triggers associated with mental health/substance abuse issues  Therapeutic Interventions: Assess for all discharge needs, 1 to 1 time with Social worker, Explore available resources and support systems, Assess for adequacy in community support network, Educate family and significant other(s) on suicide prevention, Complete Psychosocial Assessment, Interpersonal group therapy.  Evaluation of Outcomes: Progressing   Progress in Treatment: Attending groups: Yes. Participating in groups: Yes. Taking medication as prescribed: Yes. Toleration medication: Yes. Family/Significant other contact made: No, will contact:  family member if patient consents Patient understands diagnosis: Yes. Discussing patient identified problems/goals with staff: Yes. Medical problems stabilized or resolved: Yes. Denies suicidal/homicidal ideation: Yes. Issues/concerns per patient self-inventory: No. Other: n/a   New problem(s) identified: No, Describe:  n/a  New Short Term/Long Term Goal(s): detox; medication stabilization; development of comprehensive mental wellness/sobriety plan.   Discharge Plan or Barriers: Pt interested in further substance abuse treatment. Referrals to [facility] and [facility] have been made. CSW assessing.   Reason for Continuation of Hospitalization: Depression Medication stabilization Withdrawal symptoms  Estimated Length of Stay: 3-5 days   Attendees: Patient: 10/20/2016 11:14 AM  Physician: Dr. Jola Babinski MD 10/20/2016 11:14 AM  Nursing: Purvis Kilts RN 10/20/2016 11:14 AM  RN Care  Manager: Onnie Boer CM 10/20/2016 11:14 AM  Social Worker: Chartered loss adjuster, LCSW; Donnelly Stager LCSWA  10/20/2016 11:14 AM  Recreational Therapist: Juliann Pares  10/20/2016 11:14 AM  Other: Gray Bernhardt NP; Hillery Jacks NP 10/20/2016 11:14 AM  Other:  10/20/2016 11:14 AM  Other: 10/20/2016 11:14 AM    Scribe for Treatment Team: Ledell Peoples Smart, LCSW 10/20/2016 11:14 AM

## 2016-10-20 NOTE — Progress Notes (Signed)
Adult Psychoeducational Group Note  Date:  10/20/2016 Time:  10:54 PM  Group Topic/Focus:  Wrap-Up Group:   The focus of this group is to help patients review their daily goal of treatment and discuss progress on daily workbooks.  Participation Level:  Active  Participation Quality:  Attentive  Affect:  Appropriate  Cognitive:  Alert  Insight: Good  Engagement in Group:  Engaged  Modes of Intervention:  Activity  Additional Comments:  Patient rated her day a 10. Stated she had a good day and plans to handle her court issues upon discharge.  Natasha Mead 10/20/2016, 10:54 PM

## 2016-10-20 NOTE — Plan of Care (Signed)
Problem: Activity: Goal: Sleeping patterns will improve Outcome: Not Progressing Patient continues to report poor sleep.

## 2016-10-20 NOTE — [facility] Group Notes (Signed)
Pt attended spiritual care group on grief and loss facilitated by chaplain Burnis KingfisherMatthew Teneshia Hedeen   Group opened with brief discussion and psycho-social ed around grief and loss in relationships and in relation to self - identifying life patterns, circumstances, changes that cause losses. Established group norm of speaking from own life experience. Group goal of establishing open and affirming space for members to share loss and experience with grief, normalize grief experience and provide psycho social education and grief support.   [name] was present throughout group.  She volunteered that she often keeps her feelings around loss hidden, and does not feel she can connect with others.  Stated she wishes she had others in her life with whom she could feel supported.  However, described keeping others at a distance and using humor to "control" how they see her.    [name] described losing her spouse, losing a friend to death, and losing her children to CPS in succession.  Stated that people in her town saw her as "murderer" due to death of friend and she moved to [place] in order to find a new start.  She has been in [place] a year.

## 2016-10-20 NOTE — Progress Notes (Signed)
Recreation Therapy Notes  Date: 10/20/16 Time: 0930 Location: [address] Dayroom  Group Topic: Stress Management  Goal Area(s) Addresses:  Patient will verbalize importance of using healthy stress management.  Patient will identify positive emotions associated with healthy stress management.   Intervention: Stress Management  Activity :  Guided Visualization.  LRT introduced the stress management technique of guided visualization.  LRT read a script to allow patient to follow along and engage in the activity.  Patients were to follow along at LRT read script to engage in the activity.  Education:  Stress Management, Discharge Planning.   Education Outcome: Acknowledges edcuation/In group clarification offered/Needs additional education  Clinical Observations/Feedback: Pt did not attend group.    Caroll RancherMarjette Lorraine Terriquez, LRT/CTRS         Caroll RancherLindsay, Jock Mahon A 10/20/2016 12:50 PM

## 2016-10-21 MED ORDER — QUETIAPINE FUMARATE 300 MG PO TABS
300.0000 mg | ORAL_TABLET | Freq: Every day | ORAL | Status: DC
  Filled 2016-10-21 (×2): qty 1

## 2016-10-21 MED ORDER — NICOTINE 21 MG/24HR TD PT24: 21.0000 mg | MEDICATED_PATCH | Freq: Every day | TRANSDERMAL | 0 refills | Status: DC

## 2016-10-21 MED ORDER — HYDROXYZINE HCL 25 MG PO TABS: 25.0000 mg | ORAL_TABLET | Freq: Four times a day (QID) | ORAL | 0 refills | Status: DC | PRN

## 2016-10-21 MED ORDER — SERTRALINE HCL 50 MG PO TABS: 50.0000 mg | ORAL_TABLET | Freq: Every day | ORAL | 0 refills | Status: DC

## 2016-10-21 MED ORDER — QUETIAPINE FUMARATE 300 MG PO TABS: 300.0000 mg | ORAL_TABLET | Freq: Every day | ORAL | 0 refills | Status: DC

## 2016-10-21 MED ORDER — PANTOPRAZOLE SODIUM 40 MG PO TBEC: 40.0000 mg | DELAYED_RELEASE_TABLET | Freq: Every day | ORAL | 0 refills | Status: DC

## 2016-10-21 NOTE — Progress Notes (Signed)
  [facility] :  Will you be returning to the same living situation after discharge:  Yes,  home At discharge, do you have transportation home?: Yes,  family  Do you have the ability to pay for your medications: Yes,  medicaid [place] of [place]  Release of information consent forms completed and submitted to medical records by CSW.  Patient to Follow up at: Follow-up Information    [facility] Follow up.   Specialty:  Behavioral Health Why:  Walk in between 8am-9am for hospital follow-up/assessment for medication management/counseling/substance abuse outpatient treatment. Please go within 7 days of hospital discharge to be set up for these services. Thank you.  Contact information: [address] [place] [address] [phone]        [facility] Follow up.   Why:  Referral faxed: 10/20/16. No beds available at this time. If you are still intersted in this treatment facility, please call Shayla in admissions daily at 8:30AM to check bed availability and status of referral. Thank you.  Contact information: [address] [place], [place] [address] Phone: [phone] Fax: [phone]          Next level of care provider has access to [facility] Link:no  Safety Planning and Suicide Prevention discussed: Yes,  SPE completed with pt; pt declined to consent to family contact.  Have you used any form of tobacco in the last 30 days? (Cigarettes, Smokeless Tobacco, Cigars, and/or Pipes): Yes  Has patient been referred to the Quitline?: Patient refused referral  Patient has been referred for addiction treatment: Yes  Mikena Masoner N Smart LCSW 10/21/2016, 10:03 AM

## 2016-10-21 NOTE — [facility] Group Notes (Signed)
[facility] Group Notes:  (Nursing/MHT/Case Management/Adjunct)  Date:  10/21/2016  Time:  0845 am  Type of Therapy:  Psychoeducational Skills  Participation Level:  Did Not Attend  Patient invited; declined to attend.  Cranford MonBeaudry, Hollace Michelli Evans 10/21/2016, 10:16 AM

## 2016-10-21 NOTE — Progress Notes (Signed)
Recreation Therapy Notes  Animal-Assisted Activity (AAA) Program Checklist/Progress Notes Patient Eligibility Criteria Checklist & Daily Group note for Rec TxIntervention  Date: 03.06.2018 Time: 2:50pm Location: [address] Dayroom    AAA/T Program Assumption of Risk Form signed by Patient/ or Parent Legal Guardian Yes  Patient is free of allergies or sever asthma Yes  Patient reports no fear of animals Yes  Patient reports no history of cruelty to animals Yes  Patient understands his/her participation is voluntary Yes  Behavioral Response: Did not attend.   Namine Beahm L Jeorgia Helming, LRT/CTRS        Brittannie Tawney L 10/21/2016 3:09 PM 

## 2016-10-21 NOTE — Progress Notes (Signed)
Discharge note:  Patient discharged home per MD order.  Patient received all personal belongings from unit and locker.  Patient received prescriptions for her medications.  Reviewed AVS/transition record with patient and she indicated understanding.  Patient denies any thoughts of self harm.  Patient left ambulatory with a bus ticket.

## 2016-10-21 NOTE — Discharge Summary (Signed)
Physician Discharge Summary Note  Patient:  Shirley Murphy is an 31 y.o., female MRN:  [id number] DOB:  [birth year] Patient phone:  [phone] (home)  Patient address:   [address] [place] [address],  Total Time spent with patient: 30 minutes  Date of Admission:  10/17/2016 Date of Discharge: 10/21/2016  Reason for Admission:  Worsening depression  Principal Problem: MDD (major depressive disorder), recurrent severe, without psychosis [facility]) Discharge Diagnoses: Patient Active Problem List   Diagnosis Date Noted  . MDD (major depressive disorder), recurrent severe, without psychosis (HCC) [F33.2] 10/17/2016  . Suicide attempt [T14.91XA] 10/15/2016  . HTN (hypertension), benign [I10] 10/15/2016  . Irregular heart beat [I49.9] 10/15/2016  . Suicidal ideation [R45.851] 10/15/2016  . Alcoholic intoxication without complication (HCC) [F10.920]     Past Psychiatric History: see HPI  Past Medical History:  Past Medical History:  Diagnosis Date  . Chest pain   . Hepatitis C   . Hypertension   . Irregular heart beat     Past Surgical History:  Procedure Laterality Date  . c sections    . TUBAL LIGATION     Family History: History reviewed. No pertinent family history. Family Psychiatric  History:  See HPI Social History:  History  Alcohol Use No     History  Drug Use No    Social History   Social History  . Marital status: Legally Separated    Spouse name: N/A  . Number of children: N/A  . Years of education: N/A   Social History Main Topics  . Smoking status: Current Every Day Smoker    Packs/day: 1.00    Types: Cigarettes  . Smokeless tobacco: Never Used  . Alcohol use No  . Drug use: No  . Sexual activity: Not Asked   Other Topics Concern  . None   Social History Narrative  . None   Hospital Course:  Shirley Murphy a 31 year old female with a past medical history of depression, previous psychiatric hospitalization for  suicidal ideation, alcohol dependence.  Patient was admitted for attempted suicide after binge drinking, ingesting Norvasc and also cocaine.    Shirley Murphy was admitted for MDD (major depressive disorder), recurrent severe, without psychosis (HCC) and crisis management.  Patient was treated with medications with their indications listed below in detail under Medication List.  Medical problems were identified and treated as needed.  Home medications were restarted as appropriate.  Improvement was monitored by observation and Shirley Murphy daily report of symptom reduction.  Emotional and mental status was monitored by daily self inventory reports completed by Shirley Murphy and clinical staff.  Patient reported continued improvement, denied any new concerns.  Patient had been compliant on medications and denied side effects.  Support and encouragement was provided.         Shirley Murphy was evaluated by the treatment team for stability and plans for continued recovery upon discharge.  Patient was offered further treatment options upon discharge including Residential, Intensive Outpatient and Outpatient treatment. Patient will follow up with agency listed below for medication management and counseling.  Encouraged patient to maintain satisfactory support network and home environment.  Advised to adhere to medication compliance and outpatient treatment follow up.  Prescriptions provided.       Shirley Murphy motivation was an integral factor for scheduling further treatment.  Employment, transportation, bed availability, health status, family support, and any pending legal issues were also considered during patient's hospital stay.  Upon completion of this  admission the patient was both mentally and medically stable for discharge denying suicidal/homicidal ideation, auditory/visual/tactile hallucinations, delusional thoughts and paranoia.      Physical Findings: AIMS: Facial and Oral  Movements Muscles of Facial Expression: None, normal Lips and Perioral Area: None, normal Jaw: None, normal Tongue: None, normal,Extremity Movements Upper (arms, wrists, hands, fingers): None, normal Lower (legs, knees, ankles, toes): None, normal, Trunk Movements Neck, shoulders, hips: None, normal, Overall Severity Severity of abnormal movements (highest score from questions above): None, normal Incapacitation due to abnormal movements: None, normal Patient's awareness of abnormal movements (rate only patient's report): No Awareness, Dental Status Current problems with teeth and/or dentures?: No Does patient usually wear dentures?: No  CIWA:  CIWA-Ar Total: 0 COWS:  COWS Total Score: 4  Musculoskeletal: Strength & Muscle Tone: within normal limits Gait & Station: normal Patient leans: N/A  Psychiatric Specialty Exam:  See MD SRA Physical Exam  Nursing note and vitals reviewed. Psychiatric: She has a normal mood and affect. Her speech is normal and behavior is normal. Judgment and thought content normal. Cognition and memory are normal.    ROS  Blood pressure (!) 132/91, pulse 88, temperature 99.1 F (37.3 C), temperature source Oral, resp. rate 20, height 5\' 1"  (1.549 m), weight 106.1 kg (234 lb), last menstrual period 10/07/2016, SpO2 100 %.Body mass index is 44.21 kg/m.   Have you used any form of tobacco in the last 30 days? (Cigarettes, Smokeless Tobacco, Cigars, and/or Pipes): Yes  Has this patient used any form of tobacco in the last 30 days? (Cigarettes, Smokeless Tobacco, Cigars, and/or Pipes) Yes, N/A  Blood Alcohol level:  Lab Results  Component Value Date   ETH 236 (H) 10/15/2016   ETH <5 10/12/2016    Metabolic Disorder Labs:  No results found for: HGBA1C, MPG No results found for: PROLACTIN No results found for: CHOL, TRIG, HDL, CHOLHDL, VLDL, LDLCALC  See Psychiatric Specialty Exam and Suicide Risk Assessment completed by Attending Physician prior to  discharge.  Discharge destination:  Home  Is patient on multiple antipsychotic therapies at discharge:  No   Has Patient had three or more failed trials of antipsychotic monotherapy by history:  No  Recommended Plan for Multiple Antipsychotic Therapies: NA   Allergies as of 10/21/2016      Reactions   Other    Benzole peroxide      Medication List    TAKE these medications     Indication  hydrOXYzine 25 MG tablet Commonly known as:  ATARAX/VISTARIL Take 1 tablet (25 mg total) by mouth every 6 (six) hours as needed for anxiety.  Indication:  Anxiety Neurosis   nicotine 21 mg/24hr patch Commonly known as:  NICODERM CQ - dosed in mg/24 hours Place 1 patch (21 mg total) onto the skin daily. Start taking on:  10/22/2016  Indication:  Nicotine Addiction   pantoprazole 40 MG tablet Commonly known as:  PROTONIX Take 1 tablet (40 mg total) by mouth daily. Start taking on:  10/22/2016  Indication:  Gastroesophageal Reflux Disease   QUEtiapine 300 MG tablet Commonly known as:  SEROQUEL Take 1 tablet (300 mg total) by mouth at bedtime.  Indication:  mood stabilization   sertraline 50 MG tablet Commonly known as:  ZOLOFT Take 1 tablet (50 mg total) by mouth daily. Start taking on:  10/22/2016 What changed:  medication strength  how much to take  Indication:  Major Depressive Disorder      Follow-up Information    [facility]   Specialty:  Behavioral Health Why:  Walk in between 8am-9am for hospital follow-up/assessment for medication management/counseling/substance abuse outpatient treatment. Please go within 7 days of hospital discharge to be set up for these services. Thank you.  Contact information: [address] [place] [address] [phone]        [facility] Follow up.   Why:  Referral faxed: 10/20/16. No beds available at this time. If you are still intersted in this treatment facility, please call Shayla in admissions daily at 8:30AM to check bed  availability and status of referral. Thank you.  Contact information: [address] [place], [place] [address] Phone: [phone] Fax: [phone]          Follow-up recommendations:  Activity:  as tol Diet:  as tol  Comments:  1.  Take all your medications as prescribed.   2.  Report any adverse side effects to outpatient provider. 3.  Patient instructed to not use alcohol or illegal drugs while on prescription medicines. 4.  In the event of worsening symptoms, instructed patient to call 911, the crisis hotline or go to nearest emergency room for evaluation of symptoms.  Signed: Lindwood Qua, NP [facility] 10/21/2016, 3:13 PM

## 2016-10-21 NOTE — Tx Team (Signed)
Interdisciplinary Treatment and Diagnostic Plan Update  10/21/2016 Time of Session: [place] MRN: [id number]  Principal Diagnosis: MDD (major depressive disorder), recurrent severe, without psychosis ([place])  Secondary Diagnoses: Principal Problem:   MDD (major depressive disorder), recurrent severe, without psychosis ([place])   Current Medications:  Current Facility-Administered Medications  Medication Dose Route Frequency Provider Last Rate Last Dose  . acetaminophen (TYLENOL) tablet 650 mg  650 mg Oral Q6H PRN Ethelene Hal, NP   650 mg at 10/19/16 1304  . alum & mag hydroxide-simeth (MAALOX/MYLANTA) 200-200-20 MG/5ML suspension 30 mL  30 mL Oral Q4H PRN Ethelene Hal, NP      . hydrOXYzine (ATARAX/VISTARIL) tablet 25 mg  25 mg Oral Q6H PRN Ethelene Hal, NP   25 mg at 10/20/16 2101  . loratadine (CLARITIN) tablet 10 mg  10 mg Oral Daily Encarnacion Slates, NP   10 mg at 10/21/16 0802  . magnesium hydroxide (MILK OF MAGNESIA) suspension 30 mL  30 mL Oral Daily PRN Ethelene Hal, NP   30 mL at 10/20/16 2101  . multivitamin with minerals tablet 1 tablet  1 tablet Oral Daily Ethelene Hal, NP   1 tablet at 10/21/16 0801  . naltrexone (DEPADE) tablet 50 mg  50 mg Oral Daily Artist Beach, MD   50 mg at 10/21/16 0801  . nicotine (NICODERM CQ - dosed in mg/24 hours) patch 21 mg  21 mg Transdermal Daily Ethelene Hal, NP   21 mg at 10/21/16 0806  . pantoprazole (PROTONIX) EC tablet 40 mg  40 mg Oral Daily Encarnacion Slates, NP   40 mg at 10/21/16 0801  . QUEtiapine (SEROQUEL) tablet 300 mg  300 mg Oral QHS Sharma Covert, MD      . sertraline (ZOLOFT) tablet 50 mg  50 mg Oral Daily Artist Beach, MD   50 mg at 10/21/16 0801  . thiamine (VITAMIN B-1) tablet 100 mg  100 mg Oral Daily Ethelene Hal, NP   100 mg at 10/21/16 0801   PTA Medications: Prescriptions Prior to Admission  Medication Sig Dispense Refill Last Dose  . sertraline  (ZOLOFT) 25 MG tablet Take 1 tablet (25 mg total) by mouth daily.       Patient Stressors: Loss of children Substance abuse Traumatic event  Patient Strengths: Ability for insight Active sense of humor Capable of independent living Motivation for treatment/growth Work skills  Treatment Modalities: Medication Management, Group therapy, Case management,  1 to 1 session with clinician, Psychoeducation, Recreational therapy.   Physician Treatment Plan for Primary Diagnosis: MDD (major depressive disorder), recurrent severe, without psychosis ([place]) Long Term Goal(s): Improvement in symptoms so as ready for discharge Improvement in symptoms so as ready for discharge   Short Term Goals: Ability to identify changes in lifestyle to reduce recurrence of condition will improve Ability to verbalize feelings will improve Ability to disclose and discuss suicidal ideas Ability to demonstrate self-control will improve Ability to demonstrate self-control will improve Ability to identify and develop effective coping behaviors will improve Compliance with prescribed medications will improve  Medication Management: Evaluate patient's response, side effects, and tolerance of medication regimen.  Therapeutic Interventions: 1 to 1 sessions, Unit Group sessions and Medication administration.  Evaluation of Outcomes: Met  Physician Treatment Plan for Secondary Diagnosis: Principal Problem:   MDD (major depressive disorder), recurrent severe, without psychosis ([place])  Long Term Goal(s): Improvement in symptoms so as ready for discharge Improvement in symptoms so  as ready for discharge   Short Term Goals: Ability to identify changes in lifestyle to reduce recurrence of condition will improve Ability to verbalize feelings will improve Ability to disclose and discuss suicidal ideas Ability to demonstrate self-control will improve Ability to demonstrate self-control will improve Ability to identify  and develop effective coping behaviors will improve Compliance with prescribed medications will improve     Medication Management: Evaluate patient's response, side effects, and tolerance of medication regimen.  Therapeutic Interventions: 1 to 1 sessions, Unit Group sessions and Medication administration.  Evaluation of Outcomes: Met   RN Treatment Plan for Primary Diagnosis: MDD (major depressive disorder), recurrent severe, without psychosis ([place]) Long Term Goal(s): Knowledge of disease and therapeutic regimen to maintain health will improve  Short Term Goals: Ability to remain free from injury will improve, Ability to verbalize feelings will improve and Ability to disclose and discuss suicidal ideas  Medication Management: RN will administer medications as ordered by provider, will assess and evaluate patient's response and provide education to patient for prescribed medication. RN will report any adverse and/or side effects to prescribing provider.  Therapeutic Interventions: 1 on 1 counseling sessions, Psychoeducation, Medication administration, Evaluate responses to treatment, Monitor vital signs and CBGs as ordered, Perform/monitor CIWA, COWS, AIMS and Fall Risk screenings as ordered, Perform wound care treatments as ordered.  Evaluation of Outcomes: Met   LCSW Treatment Plan for Primary Diagnosis: MDD (major depressive disorder), recurrent severe, without psychosis ([place]) Long Term Goal(s): Safe transition to appropriate next level of care at discharge, Engage patient in therapeutic group addressing interpersonal concerns.  Short Term Goals: Engage patient in aftercare planning with referrals and resources, Facilitate patient progression through stages of change regarding substance use diagnoses and concerns and Identify triggers associated with mental health/substance abuse issues  Therapeutic Interventions: Assess for all discharge needs, 1 to 1 time with Social worker, Explore  available resources and support systems, Assess for adequacy in community support network, Educate family and significant other(s) on suicide prevention, Complete Psychosocial Assessment, Interpersonal group therapy.  Evaluation of Outcomes: Met/adequate for discharge    Progress in Treatment: Attending groups: No  Participating in groups: No  Taking medication as prescribed: Yes. Toleration medication: Yes. Family/Significant other contact made: SPE completed with pt; pt declined to consent to family contact.  Patient understands diagnosis: Yes. Discussing patient identified problems/goals with staff: Yes. Medical problems stabilized or resolved: Yes. Denies suicidal/homicidal ideation: Yes. Issues/concerns per patient self-inventory: No. Other: n/a   New problem(s) identified: No, Describe:  n/a  New Short Term/Long Term Goal(s): detox; medication stabilization; development of comprehensive mental wellness/sobriety plan.   Discharge Plan or Barriers: Pt reports that she has court date tomorrow and wants to discharge today in order to appear. She plans to follow-up at [facility] for outpatient mental health services and has been referred to [facility] for inpatient treatment and has been encouraged to follow-up with admissions once discharged.   Reason for Continuation of Hospitalization: none  Estimated Length of Stay: discharge today   Attendees: Patient: 10/21/2016 10:33 AM  Physician: Dr. Mallie Darting MD 10/21/2016 10:33 AM  Nursing: Sherrine Maples RN 10/21/2016 10:33 AM  RN Care Manager: Lars Pinks CM 10/21/2016 10:33 AM  Social Worker: Press photographer, LCSW; Matthew Saras LCSWA  10/21/2016 10:33 AM  Recreational Therapist: Rhunette Croft 10/21/2016 10:33 AM  Other: Samuel Jester NP; Lindell Spar NP 10/21/2016 10:33 AM  Other:  10/21/2016 10:33 AM  Other: 10/21/2016 10:33 AM    Scribe for Treatment Team: Kimber Relic  Smart, LCSW 10/21/2016 10:33 AM

## 2016-10-21 NOTE — [facility] Suicide Risk Assessment (Signed)
[facility] INPATIENT:  Family/Significant Other Suicide Prevention Education  Suicide Prevention Education:  Patient Refusal for Family/Significant Other Suicide Prevention Education: The patient Shirley Murphy has refused to provide written consent for family/significant other to be provided Family/Significant Other Suicide Prevention Education during admission and/or prior to discharge.  Physician notified.  SPE completed with pt, as pt refused to consent to family contact. SPI pamphlet provided to pt and pt was encouraged to share information with support network, ask questions, and talk about any concerns relating to SPE. Pt denies access to guns/firearms and verbalized understanding of information provided. Mobile Crisis information also provided to pt.    Breeann Reposa N Smart LCSW 10/21/2016, 10:03 AM

## 2016-10-21 NOTE — [facility] Suicide Risk Assessment (Signed)
[facility]   Principal Problem: MDD (major depressive disorder), recurrent severe, without psychosis (HCC) Discharge Diagnoses:  Patient Active Problem List   Diagnosis Date Noted  . MDD (major depressive disorder), recurrent severe, without psychosis (HCC) [F33.2] 10/17/2016  . Suicide attempt [T14.91XA] 10/15/2016  . HTN (hypertension), benign [I10] 10/15/2016  . Irregular heart beat [I49.9] 10/15/2016  . Suicidal ideation [R45.851] 10/15/2016  . Alcoholic intoxication without complication (HCC) [F10.920]     Total Time spent with patient: 20 minutes  Musculoskeletal: Strength & Muscle Tone: within normal limits Gait & Station: normal Patient leans: N/A  Psychiatric Specialty Exam: ROS  Blood pressure (!) 132/91, pulse 88, temperature 99.1 F (37.3 C), temperature source Oral, resp. rate 20, height 5\' 1"  (1.549 m), weight 106.1 kg (234 lb), last menstrual period 10/07/2016, SpO2 100 %.Body mass index is 44.21 kg/m.  General Appearance: Casual  Eye Contact::  Fair  Speech:  Clear and Coherent409  Volume:  Normal  Mood:  Euthymic  Affect:  Appropriate  Thought Process:  Coherent  Orientation:  Full (Time, Place, and Person)  Thought Content:  Logical  Suicidal Thoughts:  No  Homicidal Thoughts:  No  Memory:  Immediate;   Fair  Judgement:  Fair  Insight:  Fair  Psychomotor Activity:  Normal  Concentration:  Fair  Recall:  Fis[employer]Fair  Fund of Knowledge:Good  Language: Good  Akathisia:  No  Handed:  Right  AIMS (if indicated):     Assets:  Communication Skills Desire for Improvement Housing Resilience  Sleep:  Number of Hours: 6  Cognition: WNL  ADL's:  Intact   Mental Status Per Nursing Assessment::   On Admission:     Demographic Factors:  Low socioeconomic status  Loss Factors: Financial problems/change in socioeconomic status  Historical Factors: Impulsivity  Risk Reduction Factors:   Sense of responsibility to  family  Continued Clinical Symptoms:  Depression:   Impulsivity Alcohol/Substance Abuse/Dependencies  Cognitive Features That Contribute To Risk:  None    Suicide Risk:  Minimal: No identifiable suicidal ideation.  Patients presenting with no risk factors but with morbid ruminations; may be classified as minimal risk based on the severity of the depressive symptoms  Follow-up Information    [facility]   Specialty:  Behavioral Health Why:  Walk in between 8am-9am for hospital follow-up/assessment for medication management/counseling/substance abuse outpatient treatment. Please go within 7 days of hospital discharge to be set up for these services. Thank you.  Contact information: [address] [place] [address] [phone]        [facility] Follow up.   Why:  Referral faxed: 10/20/16. No beds available at this time. If you are still intersted in this treatment facility, please call Shayla in admissions daily at 8:30AM to check bed availability and status of referral. Thank you.  Contact information: [address] [place], [place] [address] Phone: [phone] Fax: [phone]          Plan Of Care/Follow-up recommendations:  Activity:  ad lib  Antonieta PertGreg Lawson Abed Schar, MD 10/21/2016, 9:02 AM

## 2017-02-10 DIAGNOSIS — R45851 Suicidal ideations: Secondary | ICD-10-CM | POA: Diagnosis not present

## 2017-02-10 DIAGNOSIS — F332 Major depressive disorder, recurrent severe without psychotic features: Secondary | ICD-10-CM | POA: Insufficient documentation

## 2017-02-10 DIAGNOSIS — F1910 Other psychoactive substance abuse, uncomplicated: Secondary | ICD-10-CM | POA: Insufficient documentation

## 2017-02-10 DIAGNOSIS — F17210 Nicotine dependence, cigarettes, uncomplicated: Secondary | ICD-10-CM | POA: Diagnosis not present

## 2017-02-10 DIAGNOSIS — I10 Essential (primary) hypertension: Secondary | ICD-10-CM | POA: Diagnosis not present

## 2017-02-11 ENCOUNTER — Encounter (HOSPITAL_COMMUNITY): Payer: Self-pay

## 2017-02-11 ENCOUNTER — Encounter (HOSPITAL_COMMUNITY): Payer: Self-pay | Admitting: Behavioral Health

## 2017-02-11 ENCOUNTER — Emergency Department (HOSPITAL_COMMUNITY)
Admission: EM | Admit: 2017-02-11 | Discharge: 2017-02-11 | Disposition: A | Discharge status: Other Acute Inpatient Hospital | Payer: Medicaid - Out of State | Attending: Emergency Medicine | Admitting: Emergency Medicine

## 2017-02-11 ENCOUNTER — Inpatient Hospital Stay (HOSPITAL_COMMUNITY)
Admission: EM | Admit: 2017-02-11 | Discharge: 2017-02-19 | DRG: 885 | Disposition: A | Discharge status: Home / Self Care | Payer: Medicaid - Out of State | Source: Intra-hospital | Attending: Psychiatry | Admitting: Psychiatry

## 2017-02-11 DIAGNOSIS — F17210 Nicotine dependence, cigarettes, uncomplicated: Secondary | ICD-10-CM | POA: Diagnosis present

## 2017-02-11 DIAGNOSIS — F1024 Alcohol dependence with alcohol-induced mood disorder: Secondary | ICD-10-CM

## 2017-02-11 DIAGNOSIS — G4700 Insomnia, unspecified: Secondary | ICD-10-CM | POA: Diagnosis not present

## 2017-02-11 DIAGNOSIS — Z818 Family history of other mental and behavioral disorders: Secondary | ICD-10-CM | POA: Diagnosis not present

## 2017-02-11 DIAGNOSIS — F332 Major depressive disorder, recurrent severe without psychotic features: Principal | ICD-10-CM | POA: Diagnosis present

## 2017-02-11 DIAGNOSIS — F1990 Other psychoactive substance use, unspecified, uncomplicated: Secondary | ICD-10-CM | POA: Diagnosis not present

## 2017-02-11 DIAGNOSIS — F1490 Cocaine use, unspecified, uncomplicated: Secondary | ICD-10-CM | POA: Diagnosis not present

## 2017-02-11 DIAGNOSIS — F10239 Alcohol dependence with withdrawal, unspecified: Secondary | ICD-10-CM | POA: Diagnosis present

## 2017-02-11 DIAGNOSIS — F39 Unspecified mood [affective] disorder: Secondary | ICD-10-CM | POA: Diagnosis not present

## 2017-02-11 DIAGNOSIS — T1491XA Suicide attempt, initial encounter: Secondary | ICD-10-CM | POA: Diagnosis not present

## 2017-02-11 DIAGNOSIS — F1910 Other psychoactive substance abuse, uncomplicated: Secondary | ICD-10-CM

## 2017-02-11 DIAGNOSIS — R45851 Suicidal ideations: Secondary | ICD-10-CM

## 2017-02-11 DIAGNOSIS — Z915 Personal history of self-harm: Secondary | ICD-10-CM | POA: Diagnosis not present

## 2017-02-11 DIAGNOSIS — X810XXA Intentional self-harm by jumping or lying in front of motor vehicle, initial encounter: Secondary | ICD-10-CM

## 2017-02-11 LAB — COMPREHENSIVE METABOLIC PANEL
ALBUMIN: 3.8 g/dL (ref 3.5–5.0)
ALT: 17 U/L (ref 14–54)
AST: 20 U/L (ref 15–41)
Alkaline Phosphatase: 50 U/L (ref 38–126)
Anion gap: 7 (ref 5–15)
BUN: 15 mg/dL (ref 6–20)
CHLORIDE: 107 mmol/L (ref 101–111)
CO2: 24 mmol/L (ref 22–32)
Calcium: 8.7 mg/dL — ABNORMAL LOW (ref 8.9–10.3)
Creatinine, Ser: 0.95 mg/dL (ref 0.44–1.00)
GFR calc Af Amer: >60 mL/min (ref >60)
GFR calc non Af Amer: >60 mL/min (ref >60)
Glucose, Bld: 87 mg/dL (ref 65–99)
POTASSIUM: 3.8 mmol/L (ref 3.5–5.1)
Sodium: 138 mmol/L (ref 135–145)
Total Bilirubin: 0.2 mg/dL — ABNORMAL LOW (ref 0.3–1.2)
Total Protein: 6.9 g/dL (ref 6.5–8.1)

## 2017-02-11 LAB — RAPID URINE DRUG SCREEN, HOSP PERFORMED
AMPHETAMINES: NOT DETECTED
BENZODIAZEPINES: NOT DETECTED
Barbiturates: NOT DETECTED
COCAINE: POSITIVE — AB
Opiates: NOT DETECTED
Tetrahydrocannabinol: NOT DETECTED

## 2017-02-11 LAB — ETHANOL: ALCOHOL ETHYL (B): 72 mg/dL — AB (ref <5)

## 2017-02-11 LAB — CBC
HEMATOCRIT: 36.3 % (ref 36.0–46.0)
Hemoglobin: 11.6 g/dL — ABNORMAL LOW (ref 12.0–15.0)
MCH: 26.7 pg (ref 26.0–34.0)
MCHC: 32 g/dL (ref 30.0–36.0)
MCV: 83.4 fL (ref 78.0–100.0)
PLATELETS: 265 10*3/uL (ref 150–400)
RBC: 4.35 MIL/uL (ref 3.87–5.11)
RDW: 15.7 % — ABNORMAL HIGH (ref 11.5–15.5)
WBC: 7.5 10*3/uL (ref 4.0–10.5)

## 2017-02-11 LAB — I-STAT BETA HCG BLOOD, ED (MC, WL, AP ONLY)

## 2017-02-11 LAB — SALICYLATE LEVEL: Salicylate Lvl: <7 mg/dL (ref 2.8–30.0)

## 2017-02-11 LAB — ACETAMINOPHEN LEVEL

## 2017-02-11 MED ORDER — ACETAMINOPHEN 325 MG PO TABS
650.0000 mg | ORAL_TABLET | Freq: Four times a day (QID) | ORAL | Status: DC | PRN
  Administered 2017-02-11 – 2017-02-16 (×6): 650 mg via ORAL
  Filled 2017-02-11 (×6): qty 2

## 2017-02-11 MED ORDER — PNEUMOCOCCAL VAC POLYVALENT 25 MCG/0.5ML IJ INJ: 0.5000 mL | INJECTION | INTRAMUSCULAR | Status: DC | PRN

## 2017-02-11 MED ORDER — LORAZEPAM 2 MG/ML IJ SOLN
0.0000 mg | Freq: Two times a day (BID) | INTRAMUSCULAR | Status: DC
Start: 2017-02-13 — End: 2017-02-11

## 2017-02-11 MED ORDER — LORAZEPAM 2 MG/ML IJ SOLN
0.0000 mg | Freq: Four times a day (QID) | INTRAMUSCULAR | Status: DC
Start: 2017-02-11 — End: 2017-02-11

## 2017-02-11 MED ORDER — ADULT MULTIVITAMIN W/MINERALS CH
1.0000 | ORAL_TABLET | Freq: Every day | ORAL | Status: DC
  Administered 2017-02-11 – 2017-02-18 (×8): 1 via ORAL
  Filled 2017-02-11 (×10): qty 1

## 2017-02-11 MED ORDER — THIAMINE HCL 100 MG/ML IJ SOLN
100.0000 mg | Freq: Once | INTRAMUSCULAR | Status: AC
  Administered 2017-02-11: 100 mg via INTRAMUSCULAR
  Filled 2017-02-11: qty 2

## 2017-02-11 MED ORDER — LORAZEPAM 1 MG PO TABS
1.0000 mg | ORAL_TABLET | Freq: Every day | ORAL | Status: AC
  Administered 2017-02-15: 1 mg via ORAL
  Filled 2017-02-11: qty 1

## 2017-02-11 MED ORDER — LORAZEPAM 1 MG PO TABS
1.0000 mg | ORAL_TABLET | Freq: Four times a day (QID) | ORAL | Status: AC | PRN
  Administered 2017-02-13: 1 mg via ORAL
  Filled 2017-02-11: qty 1

## 2017-02-11 MED ORDER — THIAMINE HCL 100 MG/ML IJ SOLN: 100.0000 mg | Freq: Every day | INTRAMUSCULAR | Status: DC

## 2017-02-11 MED ORDER — LOPERAMIDE HCL 2 MG PO CAPS
2.0000 mg | ORAL_CAPSULE | ORAL | Status: AC | PRN
  Administered 2017-02-12: 4 mg via ORAL
  Filled 2017-02-11: qty 2

## 2017-02-11 MED ORDER — HYDROXYZINE HCL 25 MG PO TABS
25.0000 mg | ORAL_TABLET | Freq: Four times a day (QID) | ORAL | Status: AC | PRN
  Administered 2017-02-11 – 2017-02-13 (×3): 25 mg via ORAL
  Filled 2017-02-11 (×3): qty 1

## 2017-02-11 MED ORDER — LORAZEPAM 1 MG PO TABS
0.0000 mg | ORAL_TABLET | Freq: Four times a day (QID) | ORAL | Status: DC
  Administered 2017-02-11: 1 mg via ORAL
  Filled 2017-02-11: qty 1

## 2017-02-11 MED ORDER — TRAZODONE HCL 50 MG PO TABS
50.0000 mg | ORAL_TABLET | Freq: Every evening | ORAL | Status: DC | PRN
  Administered 2017-02-11 – 2017-02-12 (×2): 50 mg via ORAL
  Filled 2017-02-11 (×2): qty 1

## 2017-02-11 MED ORDER — LORAZEPAM 1 MG PO TABS
0.0000 mg | ORAL_TABLET | Freq: Two times a day (BID) | ORAL | Status: DC
Start: 2017-02-13 — End: 2017-02-11

## 2017-02-11 MED ORDER — NICOTINE 21 MG/24HR TD PT24
21.0000 mg | MEDICATED_PATCH | Freq: Every day | TRANSDERMAL | Status: DC
  Administered 2017-02-11 – 2017-02-18 (×8): 21 mg via TRANSDERMAL
  Filled 2017-02-11 (×7): qty 1
  Filled 2017-02-11: qty 2
  Filled 2017-02-11: qty 1
  Filled 2017-02-11: qty 14
  Filled 2017-02-11: qty 1

## 2017-02-11 MED ORDER — MAGNESIUM HYDROXIDE 400 MG/5ML PO SUSP
30.0000 mL | Freq: Every day | ORAL | Status: DC | PRN
  Administered 2017-02-13 – 2017-02-14 (×2): 30 mL via ORAL
  Filled 2017-02-11 (×2): qty 30

## 2017-02-11 MED ORDER — VITAMIN B-1 100 MG PO TABS
100.0000 mg | ORAL_TABLET | Freq: Every day | ORAL | Status: DC
  Administered 2017-02-12 – 2017-02-18 (×7): 100 mg via ORAL
  Filled 2017-02-11 (×9): qty 1

## 2017-02-11 MED ORDER — VITAMIN B-1 100 MG PO TABS: 100.0000 mg | ORAL_TABLET | Freq: Every day | ORAL | Status: DC

## 2017-02-11 MED ORDER — ONDANSETRON 4 MG PO TBDP: 4.0000 mg | ORAL_TABLET | Freq: Four times a day (QID) | ORAL | Status: AC | PRN

## 2017-02-11 MED ORDER — LORAZEPAM 1 MG PO TABS
1.0000 mg | ORAL_TABLET | Freq: Two times a day (BID) | ORAL | Status: AC
  Administered 2017-02-13 – 2017-02-14 (×2): 1 mg via ORAL
  Filled 2017-02-11 (×2): qty 1

## 2017-02-11 MED ORDER — LORAZEPAM 1 MG PO TABS
1.0000 mg | ORAL_TABLET | Freq: Four times a day (QID) | ORAL | Status: AC
  Administered 2017-02-11 – 2017-02-12 (×4): 1 mg via ORAL
  Filled 2017-02-11 (×4): qty 1

## 2017-02-11 MED ORDER — SERTRALINE HCL 50 MG PO TABS
50.0000 mg | ORAL_TABLET | Freq: Every day | ORAL | Status: DC
  Administered 2017-02-11 – 2017-02-18 (×8): 50 mg via ORAL
  Filled 2017-02-11 (×6): qty 1
  Filled 2017-02-11: qty 14
  Filled 2017-02-11 (×3): qty 1

## 2017-02-11 MED ORDER — LORAZEPAM 1 MG PO TABS
1.0000 mg | ORAL_TABLET | Freq: Three times a day (TID) | ORAL | Status: AC
  Administered 2017-02-12 – 2017-02-13 (×3): 1 mg via ORAL
  Filled 2017-02-11 (×4): qty 1

## 2017-02-11 MED ORDER — HYDROXYZINE HCL 25 MG PO TABS
25.0000 mg | ORAL_TABLET | Freq: Four times a day (QID) | ORAL | Status: DC | PRN
  Administered 2017-02-13 – 2017-02-18 (×12): 25 mg via ORAL
  Filled 2017-02-11: qty 20
  Filled 2017-02-11 (×12): qty 1

## 2017-02-11 MED ORDER — ALUM & MAG HYDROXIDE-SIMETH 200-200-20 MG/5ML PO SUSP
30.0000 mL | ORAL | Status: DC | PRN
  Administered 2017-02-15: 30 mL via ORAL
  Filled 2017-02-11 (×2): qty 30

## 2017-02-11 NOTE — Progress Notes (Signed)
  DATA ACTION RESPONSE  Objective- Pt. is visible in the dayroom, seen watching TV. Presents with an anxious/depressed affect and mood. Pt. states she is currently expericing detox s/s from alcohol. Pt. also request if she could be program on [address] hall. BP/HR elevated this evening.  Subjective- Denies having any SI/HI/AVH/Pain at this time. Pt. states "I need to focus on myself this time".    Is cooperative and remains safe on the unit.  1:1 interaction in private to establish rapport. Encouragement, education, & support given from staff.  PRN Trazodone and Vistaril  requested and will re-eval accordingly.   Safety maintained with Q 15 checks. Continue with POC.

## 2017-02-11 NOTE — BH Assessment (Signed)
Tele Assessment Note   Shirley Murphy is an 31 y.o. female who is [facility].  Pt denies having a plan and states she feels this way because of stressors in her marriage.  She states she feels her husband does not love her.  She denies HI and does not endorse AVH.  She denies current and past use of illicit drugs and substance.  She did not report in history of hospitalizations or episodes of therapy.  Shirley Murphy resides at home with her husband and can return home. She denies having legal issues.She does not have a history of violence.  She presented in a hospital gown.  Her eye contact was poor and her speech was very low.  Pt had to be asked to repeat information.  Pt was sleepy, however, cooperative.  Pt was mood was calm and her affect was congruent with her mood. Pt judgement was unimpaired her insight was fair.  Recommendations are AM Psyche Eval per Shirley Murphy.  Diagnosis: Major Depression  Past Medical History:  Past Medical History:  Diagnosis Date  . Chest pain   . Hepatitis C   . Hypertension   . Irregular heart beat     Past Surgical History:  Procedure Laterality Date  . c sections    . TUBAL LIGATION      Family History: No family history on file.  Social History:  reports that she has been smoking Cigarettes.  She has been smoking about 1.00 pack per day. She has never used smokeless tobacco. She reports that she does not drink alcohol or use drugs.  Additional Social History:  Alcohol / Drug Use Pain Medications: See MAR Prescriptions: See MAR Over the Counter: See MAR History of alcohol / drug use?: Yes Substance #1 Name of Substance 1: Alcohol 1 - Age of First Use: UTA 1 - Amount (size/oz): Pt reported, drinking 18 beers and Vodka.  1 - Frequency: UTA 1 - Duration: UTA 1 - Last Use / Amount: Pt reported, today.  Substance #2 Name of Substance 2: Cigarettes 2 - Age of First Use: UTA 2 - Amount (size/oz): Pt reporte,  smoking a pack of cigarettes, daily.  2 - Frequency: UTA 2 - Duration: UTA 2 - Last Use / Amount: Pt reportsm daily.  Substance #3 Name of Substance 3: Cocaine 3 - Age of First Use: UTA 3 - Amount (size/oz): Pt's UDS is positive for cocaine.  3 - Frequency: UTA 3 - Duration: UTA 3 - Last Use / Amount: UTA  CIWA: CIWA-Ar BP: (!) 142/91 Pulse Rate: 88 COWS:    PATIENT STRENGTHS: (choose at least two) Supportive family/friends Work skills  Allergies:  Allergies  Allergen Reactions  . Other     benzoyl peroxide    Home Medications:  (Not in a hospital admission)  OB/GYN Status:  No LMP recorded.              Risk to self with the past 6 months Is patient at risk for suicide?: Yes Substance abuse history and/or treatment for substance abuse?: Yes                                     Advance Directives (For Healthcare) Does Patient Have a Medical Advance Directive?: No          Disposition:     Shirley Murphy 02/11/2017 3:53 AM

## 2017-02-11 NOTE — ED Provider Notes (Signed)
[facility]-EMERGENCY DEPT Provider Note   CSN: [id number] Arrival date & time: 02/10/17  2357   By signing my name below, I, Clarisse Gouge, attest that this documentation has been prepared under the direction and in the presence of Albert Hersch, Mayer Masker, MD. Electronically signed, Clarisse Gouge, ED Scribe. 02/11/17. 1:00 AM.   History   Chief Complaint Chief Complaint  Patient presents with  . Suicidal   The history is provided by the patient and medical records. No language interpreter was used.    Shirley Murphy is a 31 y.o. female with h/o depression BIB Police voluntarily to the Emergency Department concerning following a suicidal gesture yesterday. Pt attempted suicide by jumping in front of cars; she states "they all missed [her]." No SI currently; pt states she deals with frequent SI intermittently. Past hospitalization for depression noted. Pt drinks an 18 pack of beer daily; she states she used cocaine for the first time last night and feels this may contributed to her jumping in front of cars. 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 No chest pain, SOB, abdominal pain or N/V/D. No other complaints at this time.   Past Medical History:  Diagnosis Date  . Chest pain   . Hepatitis C   . Hypertension   . Irregular heart beat     Patient Active Problem List   Diagnosis Date Noted  . MDD (major depressive disorder), recurrent severe, without psychosis (HCC) 10/17/2016  . Suicide attempt (HCC) 10/15/2016  . HTN (hypertension), benign 10/15/2016  . Irregular heart beat 10/15/2016  . Suicidal ideation 10/15/2016  . Alcoholic intoxication without complication [facility])     Past Surgical History:  Procedure Laterality Date  . c sections    . TUBAL LIGATION      OB History    No data available       Home Medications    Prior to Admission medications   Medication Sig Start Date End Date Taking? Authorizing Provider  hydrOXYzine (ATARAX/VISTARIL) 25 MG tablet Take 1  tablet (25 mg total) by mouth every 6 (six) hours as needed for anxiety. Patient not taking: Reported on 02/11/2017 10/21/16   Adonis Brook, NP  nicotine (NICODERM CQ - DOSED IN MG/24 HOURS) 21 mg/24hr patch Place 1 patch (21 mg total) onto the skin daily. Patient not taking: Reported on 02/11/2017 10/22/16   Adonis Brook, NP  pantoprazole (PROTONIX) 40 MG tablet Take 1 tablet (40 mg total) by mouth daily. Patient not taking: Reported on 02/11/2017 10/22/16   Adonis Brook, NP  QUEtiapine (SEROQUEL) 300 MG tablet Take 1 tablet (300 mg total) by mouth at bedtime. Patient not taking: Reported on 02/11/2017 10/21/16   Adonis Brook, NP  sertraline (ZOLOFT) 50 MG tablet Take 1 tablet (50 mg total) by mouth daily. Patient not taking: Reported on 02/11/2017 10/22/16   Adonis Brook, NP    Family History No family history on file.  Social History Social History  Substance Use Topics  . Smoking status: Current Every Day Smoker    Packs/day: 1.00    Types: Cigarettes  . Smokeless tobacco: Never Used  . Alcohol use No     Allergies   Other   Review of Systems Review of Systems  Respiratory: Negative for shortness of breath.   Cardiovascular: Negative for chest pain.  Gastrointestinal: Negative for abdominal pain, diarrhea, nausea and vomiting.  Psychiatric/Behavioral: Positive for suicidal ideas. Negative for self-injury. The patient is nervous/anxious.   All other systems reviewed and are negative.  Physical Exam Updated Vital Signs BP (!) 142/91 (BP Location: Left Arm)   Pulse 88   Temp 98.9 F (37.2 C) (Oral)   Resp 20   SpO2 98%   Physical Exam  Constitutional: She is oriented to person, place, and time. She appears well-developed and well-nourished. No distress.  Obese  HENT:  Head: Normocephalic and atraumatic.  Cardiovascular: Normal rate, regular rhythm and normal heart sounds.   No murmur heard. Pulmonary/Chest: Effort normal and breath sounds normal. No  respiratory distress. She has no wheezes.  Abdominal: Soft. She exhibits no distension. There is no tenderness.  Neurological: She is alert and oriented to person, place, and time.  Skin: Skin is warm and dry.  Nursing note and vitals reviewed.    ED Treatments / Results  DIAGNOSTIC STUDIES: Oxygen Saturation is 98% on RA, NL by my interpretation.    COORDINATION OF CARE: 12:53 AM-Discussed next steps with pt. Pt verbalized understanding and is agreeable with the plan. Pt prepared for TTS evaluation.   Labs (all labs ordered are listed, but only abnormal results are displayed) Labs Reviewed  COMPREHENSIVE METABOLIC PANEL - Abnormal; Notable for the following:       Result Value   Calcium 8.7 (*)    Total Bilirubin 0.2 (*)    All other components within normal limits  ETHANOL - Abnormal; Notable for the following:    Alcohol, Ethyl (B) 72 (*)    All other components within normal limits  ACETAMINOPHEN LEVEL - Abnormal; Notable for the following:    Acetaminophen (Tylenol), Serum <10 (*)    All other components within normal limits  CBC - Abnormal; Notable for the following:    Hemoglobin 11.6 (*)    RDW 15.7 (*)    All other components within normal limits  RAPID URINE DRUG SCREEN, HOSP PERFORMED - Abnormal; Notable for the following:    Cocaine POSITIVE (*)    All other components within normal limits  SALICYLATE LEVEL  I-STAT BETA HCG BLOOD, ED ([facility], [facility], [facility] ONLY)    EKG  EKG Interpretation None       Radiology No results found.  Procedures Procedures (including critical care time)  Medications Ordered in ED Medications  LORazepam (ATIVAN) injection 0-4 mg (not administered)    Or  LORazepam (ATIVAN) tablet 0-4 mg (not administered)  LORazepam (ATIVAN) injection 0-4 mg (not administered)    Or  LORazepam (ATIVAN) tablet 0-4 mg (not administered)  thiamine (VITAMIN B-1) tablet 100 mg (not administered)    Or  thiamine (B-1) injection 100 mg (not  administered)     Initial Impression / Assessment and Plan / ED Course  I have reviewed the triage vital signs and the nursing notes.  Pertinent labs & imaging results that were available during my care of the patient were reviewed by me and considered in my medical decision making (see chart for details).     Patient presents with suicidal ideation. Currently denies SI but states that she was jumping in front of cars prior to arrival. She thinks this may be related to cocaine use. She states that she does not normally use cocaine. She is a daily drinker and has a history of alcohol withdrawal seizures. She is otherwise nontoxic without physical complaint. Vital signs reassuring. Workup is largely reassuring. She is medically clear for TTS evaluation. She was placed on CIWA protocol given history of alcohol withdrawal seizures.  Final Clinical Impressions(s) / ED Diagnoses   Final diagnoses:  Polysubstance  abuse  Suicidal ideation    New Prescriptions New Prescriptions   No medications on file   I personally performed the services described in this documentation, which was scribed in my presence. The recorded information has been reviewed and is accurate.    Shon BatonHorton, Maija Biggers F, MD 02/11/17 [phone]

## 2017-02-11 NOTE — Progress Notes (Signed)
[facility] Pt presented with an animated affect and anxious mood. Pt reported increased depression, suicidal thoughts and substance abuse. Pt reported hx of depression since the age of 31 years old. Pt stopped taking medications a year ago. Pt verbalized that she was sentenced to three years in jail for manslaughter after a friend of hers overdosed on heroin and died in her car in 2012. At that time she loss custody of her children and they ended up getting adopted. Pt reported heavy alcohol use. Pt reports drinking a 24 pack of beer a day along with a few lines of cocaine a few times a week. Pt is now homeless after losing her job due to no transportation. Pt was currently living in a motel but now lives with her sister occasionally due to not being able to afford motel fees. Pt seeking tx to get back on meds "Zoloft" and to detox off of alcohol.

## 2017-02-11 NOTE — ED Notes (Signed)
[facility] Transfer to [facility] cancelled.

## 2017-02-11 NOTE — ED Notes (Signed)
Patient is alert and oriented x3.  [facility].  V/S stable.  He was not showing any signs of distress on DC
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
## 2017-02-11 NOTE — BH Assessment (Signed)
[facility] Assessment Progress Note  Per Donell SievertSpencer Simon, PA, this pt requires psychiatric hospitalization at this time.  Clint Bolderori Beck, RN, [facility]; they will be ready to receive pt after 07:30.  Pt has signed Voluntary Admission and Consent for Treatment, as well as Consent to Release Information to no one, and signed [facility].  Pt's nurse has been notified, and agrees to send original paperwork along with pt via [place], and to call report to [phone].  Doylene Canninghomas Caasi Giglia, MA Triage Specialist [phone]

## 2017-02-11 NOTE — Plan of Care (Signed)
Problem: Safety: Goal: Periods of time without injury will increase Outcome: Progressing Pt. remains a low fall risk, denies SI/HI/AVH at this time, Q 15 checks in effect.    

## 2017-02-11 NOTE — Tx Team (Signed)
Initial Treatment Plan 02/11/2017 11:06 AM Shirley Murphy [id number]    PATIENT STRESSORS: Financial difficulties Loss of children Medication change or noncompliance Substance abuse   PATIENT STRENGTHS: Ability for insight Capable of independent living   PATIENT IDENTIFIED PROBLEMS: "GET OFF DRUGS"  "GET BACK ON MEDICATIONS FOR DEPRESSION"                   DISCHARGE CRITERIA:  Ability to meet basic life and health needs Adequate post-discharge living arrangements Improved stabilization in mood, thinking, and/or behavior  PRELIMINARY DISCHARGE PLAN: Attend aftercare/continuing care group Attend PHP/IOP  PATIENT/FAMILY INVOLVEMENT: This treatment plan has been presented to and reviewed with the patient, Shirley Murphy, and/or family member.  The patient and family have been given the opportunity to ask questions and make suggestions.  Shirley Murphy, Shirley Isenhower L, RN 02/11/2017, 11:06 AM

## 2017-02-11 NOTE — ED Notes (Signed)
Bed: WTR6 Expected date:  Expected time:  Means of arrival:  Comments: 
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
## 2017-02-12 MED ORDER — TRAZODONE HCL 50 MG PO TABS
50.0000 mg | ORAL_TABLET | Freq: Every evening | ORAL | Status: DC | PRN
  Administered 2017-02-12: 50 mg via ORAL
  Filled 2017-02-12 (×6): qty 1

## 2017-02-12 NOTE — Progress Notes (Signed)
D: Pt presents with an animated affect and anxious/depressed mood. Pt rates depression 7/10. Anxiety 6/10. Pt denies SI/HI. Pt denies AVH. Pt reports withdrawal symptoms of nausea, cravings, headaches, sweats and diarrhea. Pt given meds per protocol for discomfort. Pt interested in attending AA meetings on [address]. Per Dr. Jama Flavorsobos pt may program on [address]. A: Medications reviewed with pt. Medications administered as ordered per MD. Verbal support provided. Pt encouraged to attend groups. 15 minute checks performed for safety. R: Pt receptive to tx.

## 2017-02-12 NOTE — Social Work (Signed)
[facility]  Santa GeneraAnne Courtney Bellizzi, LCSW Lead Clinical Social Worker Phone:  [phone]

## 2017-02-12 NOTE — Progress Notes (Signed)
Pt. Did not attend wrap up group.

## 2017-02-12 NOTE — Progress Notes (Signed)
[facility] MD Progress Note  02/12/2017 11:16 AM Shirley Murphy  MRN:  [id number] Subjective:  Patient reports she is feeling anxious, and describes some ongoing symptoms of alcohol withdrawal, mainly restlessness, some nausea, feeling sweaty and jittery. Denies suicidal ideations. Denies medication side effects. Objective : I have discussed case with treatment team and have met with patient . Patient presents mildly depressed, anxious, but affect tends to improve during session. She reports feeling " jittery" and presents with mild distal tremors. Vitals are stable and she does not appear to be in any acute distress .  Denies medication side effects. Thus far tolerating Ativan detox protocol well. She has been less isolative, and has been spending more time in day room. No disruptive or agitated behaviors on unit.  Principal Problem:  Alcohol Dependence  Diagnosis:   Patient Active Problem List   Diagnosis Date Noted  . MDD (major depressive disorder), recurrent severe, without psychosis ([place]) [F33.2] 10/17/2016  . Suicide attempt ([place]) [T14.91XA] 10/15/2016  . HTN (hypertension), benign [I10] 10/15/2016  . Irregular heart beat [I49.9] 10/15/2016  . Suicidal ideation [R45.851] 10/15/2016  . Alcoholic intoxication without complication ([place]) [W58.099]    Total Time spent with patient: 20 minutes  Past Medical History:  Past Medical History:  Diagnosis Date  . Chest pain   . Hepatitis C   . Hypertension   . Irregular heart beat     Past Surgical History:  Procedure Laterality Date  . c sections    . TUBAL LIGATION     Family History: History reviewed. No pertinent family history. Social History:  History  Alcohol Use No     History  Drug Use  . Types: Cocaine    Social History   Social History  . Marital status: Legally Separated    Spouse name: N/A  . Number of children: N/A  . Years of education: N/A   Social History Main Topics  . Smoking status: Current Every Day  Smoker    Packs/day: 1.00    Types: Cigarettes  . Smokeless tobacco: Never Used  . Alcohol use No  . Drug use: Yes    Types: Cocaine  . Sexual activity: Yes    Birth control/ protection: None   Other Topics Concern  . None   Social History Narrative  . None   Additional Social History:   Sleep: Fair  Appetite:  Fair  Current Medications: Current Facility-Administered Medications  Medication Dose Route Frequency Provider Last Rate Last Dose  . acetaminophen (TYLENOL) tablet 650 mg  650 mg Oral Q6H PRN Ethelene Hal, NP   650 mg at 02/12/17 8338  . alum & mag hydroxide-simeth (MAALOX/MYLANTA) 200-200-20 MG/5ML suspension 30 mL  30 mL Oral Q4H PRN Ethelene Hal, NP      . hydrOXYzine (ATARAX/VISTARIL) tablet 25 mg  25 mg Oral Q6H PRN Ethelene Hal, NP   25 mg at 02/11/17 2142  . hydrOXYzine (ATARAX/VISTARIL) tablet 25 mg  25 mg Oral Q6H PRN Ethelene Hal, NP      . loperamide (IMODIUM) capsule 2-4 mg  2-4 mg Oral PRN Ethelene Hal, NP   4 mg at 02/12/17 2505  . LORazepam (ATIVAN) tablet 1 mg  1 mg Oral Q6H PRN Ethelene Hal, NP      . LORazepam (ATIVAN) tablet 1 mg  1 mg Oral TID Ethelene Hal, NP       Followed by  . [START ON 02/13/2017] LORazepam (ATIVAN) tablet 1 mg  1 mg Oral BID Ethelene Hal, NP       Followed by  . [START ON 02/15/2017] LORazepam (ATIVAN) tablet 1 mg  1 mg Oral Daily Ethelene Hal, NP      . magnesium hydroxide (MILK OF MAGNESIA) suspension 30 mL  30 mL Oral Daily PRN Ethelene Hal, NP      . multivitamin with minerals tablet 1 tablet  1 tablet Oral Daily Ethelene Hal, NP   1 tablet at 02/12/17 0831  . nicotine (NICODERM CQ - dosed in mg/24 hours) patch 21 mg  21 mg Transdermal Daily Roczen Waymire, Myer Peer, MD   21 mg at 02/12/17 0834  . ondansetron (ZOFRAN-ODT) disintegrating tablet 4 mg  4 mg Oral Q6H PRN Ethelene Hal, NP      . Derrill Memo ON 02/13/2017] pneumococcal 23  valent vaccine (PNU-IMMUNE) injection 0.5 mL  0.5 mL Intramuscular Prior to discharge Andi Mahaffy, Myer Peer, MD      . sertraline (ZOLOFT) tablet 50 mg  50 mg Oral Daily Jacilyn Sanpedro, Myer Peer, MD   50 mg at 02/12/17 0831  . thiamine (VITAMIN B-1) tablet 100 mg  100 mg Oral Daily Ethelene Hal, NP   100 mg at 02/12/17 0831  . traZODone (DESYREL) tablet 50 mg  50 mg Oral QHS PRN Ethelene Hal, NP   50 mg at 02/11/17 2142    Lab Results:  Results for orders placed or performed during the hospital encounter of 02/11/17 (from the past 48 hour(s))  Rapid urine drug screen (hospital performed)     Status: Abnormal   Collection Time: 02/11/17 12:15 AM  Result Value Ref Range   Opiates NONE DETECTED NONE DETECTED   Cocaine POSITIVE (A) NONE DETECTED   Benzodiazepines NONE DETECTED NONE DETECTED   Amphetamines NONE DETECTED NONE DETECTED   Tetrahydrocannabinol NONE DETECTED NONE DETECTED   Barbiturates NONE DETECTED NONE DETECTED    Comment:        DRUG SCREEN FOR MEDICAL PURPOSES ONLY.  IF CONFIRMATION IS NEEDED FOR ANY PURPOSE, NOTIFY LAB WITHIN 5 DAYS.        LOWEST DETECTABLE LIMITS FOR URINE DRUG SCREEN Drug Class       Cutoff (ng/mL) Amphetamine      1000 Barbiturate      200 Benzodiazepine   287 Tricyclics       681 Opiates          300 Cocaine          300 THC              50   Comprehensive metabolic panel     Status: Abnormal   Collection Time: 02/11/17 12:40 AM  Result Value Ref Range   Sodium 138 135 - 145 mmol/L   Potassium 3.8 3.5 - 5.1 mmol/L   Chloride 107 101 - 111 mmol/L   CO2 24 22 - 32 mmol/L   Glucose, Bld 87 65 - 99 mg/dL   BUN 15 6 - 20 mg/dL   Creatinine, Ser 0.95 0.44 - 1.00 mg/dL   Calcium 8.7 (L) 8.9 - 10.3 mg/dL   Total Protein 6.9 6.5 - 8.1 g/dL   Albumin 3.8 3.5 - 5.0 g/dL   AST 20 15 - 41 U/L   ALT 17 14 - 54 U/L   Alkaline Phosphatase 50 38 - 126 U/L   Total Bilirubin 0.2 (L) 0.3 - 1.2 mg/dL   GFR calc non Af Amer >60 >60 mL/min   GFR  calc Af Amer >60 >60 mL/min    Comment: (NOTE) The eGFR has been calculated using the CKD EPI equation. This calculation has not been validated in all clinical situations. eGFR's persistently <60 mL/min signify possible Chronic Kidney Disease.    Anion gap 7 5 - 15  Ethanol     Status: Abnormal   Collection Time: 02/11/17 12:40 AM  Result Value Ref Range   Alcohol, Ethyl (B) 72 (H) <5 mg/dL    Comment:        LOWEST DETECTABLE LIMIT FOR SERUM ALCOHOL IS 5 mg/dL FOR MEDICAL PURPOSES ONLY   Salicylate level     Status: None   Collection Time: 02/11/17 12:40 AM  Result Value Ref Range   Salicylate Lvl <4.2 2.8 - 30.0 mg/dL  Acetaminophen level     Status: Abnormal   Collection Time: 02/11/17 12:40 AM  Result Value Ref Range   Acetaminophen (Tylenol), Serum <10 (L) 10 - 30 ug/mL    Comment:        THERAPEUTIC CONCENTRATIONS VARY SIGNIFICANTLY. A RANGE OF 10-30 ug/mL MAY BE AN EFFECTIVE CONCENTRATION FOR MANY PATIENTS. HOWEVER, SOME ARE BEST TREATED AT CONCENTRATIONS OUTSIDE THIS RANGE. ACETAMINOPHEN CONCENTRATIONS >150 ug/mL AT 4 HOURS AFTER INGESTION AND >50 ug/mL AT 12 HOURS AFTER INGESTION ARE OFTEN ASSOCIATED WITH TOXIC REACTIONS.   cbc     Status: Abnormal   Collection Time: 02/11/17 12:40 AM  Result Value Ref Range   WBC 7.5 4.0 - 10.5 K/uL   RBC 4.35 3.87 - 5.11 MIL/uL   Hemoglobin 11.6 (L) 12.0 - 15.0 g/dL   HCT 36.3 36.0 - 46.0 %   MCV 83.4 78.0 - 100.0 fL   MCH 26.7 26.0 - 34.0 pg   MCHC 32.0 30.0 - 36.0 g/dL   RDW 15.7 (H) 11.5 - 15.5 %   Platelets 265 150 - 400 K/uL  I-Stat beta hCG blood, ED     Status: None   Collection Time: 02/11/17 12:45 AM  Result Value Ref Range   I-stat hCG, quantitative <5.0 <5 mIU/mL   Comment 3            Comment:   GEST. AGE      CONC.  (mIU/mL)   <=1 WEEK        5 - 50     2 WEEKS       50 - 500     3 WEEKS       100 - 10,000     4 WEEKS     1,000 - 30,000        FEMALE AND NON-PREGNANT FEMALE:     LESS THAN 5  mIU/mL     Blood Alcohol level:  Lab Results  Component Value Date   ETH 72 (H) 02/11/2017   ETH 236 (H) 70/62/3762    Metabolic Disorder Labs: No results found for: HGBA1C, MPG No results found for: PROLACTIN No results found for: CHOL, TRIG, HDL, CHOLHDL, VLDL, LDLCALC  Physical Findings: AIMS: Facial and Oral Movements Muscles of Facial Expression: None, normal Lips and Perioral Area: None, normal Jaw: None, normal Tongue: None, normal,Extremity Movements Upper (arms, wrists, hands, fingers): None, normal Lower (legs, knees, ankles, toes): None, normal, Trunk Movements Neck, shoulders, hips: None, normal, Overall Severity Severity of abnormal movements (highest score from questions above): None, normal Incapacitation due to abnormal movements: None, normal Patient's awareness of abnormal movements (rate only patient's report): No Awareness, Dental Status Current problems with teeth and/or dentures?: No Does  patient usually wear dentures?: No  CIWA:  CIWA-Ar Total: 2 COWS:     Musculoskeletal: Strength & Muscle Tone: within normal limits- mild distal tremors Gait & Station: normal Patient leans: N/A  Psychiatric Specialty Exam: Physical Exam  ROS- no chest pain, no shortness of breath, (+) nausea, no vomiting   Blood pressure 125/80, pulse 91, temperature 98 F (36.7 C), temperature source Oral, resp. rate 16, height '5\' 1"'  (1.549 m), weight 105.7 kg (233 lb), last menstrual period 02/02/2017, SpO2 100 %.Body mass index is 44.02 kg/m.  General Appearance: Fairly Groomed  Eye Contact:  Good  Speech:  Normal Rate  Volume:  Normal  Mood:  depressed, but improving   Affect:  constricted, vaguely anxious, does smile at times appropriately   Thought Process:  Linear and Descriptions of Associations: Intact  Orientation:  Full (Time, Place, and Person)  Thought Content:  no hallucinations, no delusions, not internally preoccupied   Suicidal Thoughts:  No denies suicidal  or self injurious ideations , denies homicidal or violent ideations  Homicidal Thoughts:  No  Memory:  recent and remote grossly intact   Judgement:  Fair- improving   Insight:  Fair- improving   Psychomotor Activity:  Normal  Concentration:  Concentration: Good and Attention Span: Good  Recall:  Good  Fund of Knowledge:  Good  Language:  Good  Akathisia:  Negative  Handed:  Right  AIMS (if indicated):     Assets:  Communication Skills Desire for Improvement Resilience  ADL's:  Intact  Cognition:  WNL  Sleep:  Number of Hours: 5.75   Assessment - patient presents with mild dysphoria, depression, but denies suicidal ideations. Reports some ongoing symptoms of alcohol withdrawal, such as nausea, feeling clammy and restless. Vitals are stable. She denies SI. At this time tolerating medications well . She is future oriented and interested in going to a rehab setting on discharge.  Treatment Plan Summary: Daily contact with patient to assess and evaluate symptoms and progress in treatment, Medication management, Plan inpatient admission and medications as below Encourage group and milieu participation to work on coping skills and symptom reduction Continue to encourage efforts to work on recovery and relapse prevention  Patient is interested in going to a rehab setting at discharge treatment team working on disposition planning  Continue Ativan detox protocol to address alcohol withdrawal  Continue Zoloft 50 mgrs QDAY for depression and anxiety Continue Trazodone 50 mgrs QHS PRN for insomnia  Jenne Campus, MD 02/12/2017, 11:16 AM

## 2017-02-12 NOTE — [facility] Group Notes (Signed)
[facility] LCSW Group Therapy 02/12/2017 1:15pm  Type of Therapy and Topic: Group Therapy: Avoiding Self-Sabotaging and Enabling Behaviors   Participation Level: Pt invited, did not attend.   Donnelly StagerLynn Eartha Vonbehren, MSW, [facility] ClinicCSWA 02/12/2017 4:53 PM
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
## 2017-02-12 NOTE — Plan of Care (Signed)
Problem: Activity: Goal: Sleeping patterns will improve Outcome: Progressing Pt. slept 5.75 hrs last night.   

## 2017-02-12 NOTE — Progress Notes (Addendum)
  DATA ACTION RESPONSE  Objective- Pt. is visible in the dayroom, seen interacting with peers loudly. Presents with an animated    affect and mood. C/o of sleep disturbance and  withdrawals s/s.    Subjective- Denies having any SI/HI/AVH/Pain at this time. Is cooperative and remain safe on the unit.  1:1 interaction in private to establish rapport. Encouragement, education, & support given from staff.  PRN Trazodone and vistaril requested and will re-eval accordingly.   Safety maintained with Q 15 checks. Continue with POC.

## 2017-02-12 NOTE — Progress Notes (Signed)
Adult Psychoeducational Group Note  Date:  02/12/2017 Time:  9:00 am  Group Topic/Focus:  Orientation:   The focus of this group is to educate the patient on the purpose and policies of crisis stabilization and provide a format to answer questions about their admission.  The group details unit policies and expectations of patients while admitted.  Participation Level:  Did Not Attend  Participation Quality:    Affect:    Cognitive:    Insight:   Engagement in Group:    Modes of Intervention:    Additional Comments:   Zelta Enfield L 02/12/2017, 

## 2017-02-13 DIAGNOSIS — F332 Major depressive disorder, recurrent severe without psychotic features: Principal | ICD-10-CM

## 2017-02-13 MED ORDER — GABAPENTIN 300 MG PO CAPS
300.0000 mg | ORAL_CAPSULE | Freq: Three times a day (TID) | ORAL | Status: DC
  Administered 2017-02-13 – 2017-02-18 (×22): 300 mg via ORAL
  Filled 2017-02-13: qty 56
  Filled 2017-02-13 (×3): qty 1
  Filled 2017-02-13: qty 56
  Filled 2017-02-13 (×3): qty 1
  Filled 2017-02-13: qty 56
  Filled 2017-02-13 (×15): qty 1
  Filled 2017-02-13: qty 56
  Filled 2017-02-13 (×4): qty 1

## 2017-02-13 MED ORDER — TRAZODONE HCL 150 MG PO TABS
150.0000 mg | ORAL_TABLET | Freq: Every day | ORAL | Status: DC
  Administered 2017-02-13 – 2017-02-15 (×3): 150 mg via ORAL
  Filled 2017-02-13 (×5): qty 1

## 2017-02-13 NOTE — Progress Notes (Signed)
[facility] MD Progress Note  02/13/2017 4:43 PM Shirley Murphy  MRN:  [id number]  Subjective:  Shirley Murphy reports, "I have a lot of anxiety. I'm shaking real bad. I feel irritated. I have been drinking heavily x 4 years & smoking crack for 8 months. My body is worn out. I need help".  Objective: Shirley Murphy is seen, chart reviewed. I have discussed case with treatment team and have met with patient . Patient presents mildly depressed, anxious, but affect tends to improve during session. She reports feeling " jittery" and presents with mild distal tremors. Vitals are stable and she does not appear to be in any acute distress .  Denies medication side effects. Thus far tolerating Ativan detox protocol well. She has been less isolative, and has been spending more time in day room.No disruptive or agitated behaviors on unit. She says she drinks a lot because she fells she has nothing to live for since losing her children to adoption while in prison. She is hoping to get into a long term substance abuse treatment after discharge.  Principal Problem:  Alcohol Dependence  Diagnosis:   Patient Active Problem List   Diagnosis Date Noted  . MDD (major depressive disorder), recurrent severe, without psychosis ([place]) [F33.2] 10/17/2016  . Suicide attempt ([place]) [T14.91XA] 10/15/2016  . HTN (hypertension), benign [I10] 10/15/2016  . Irregular heart beat [I49.9] 10/15/2016  . Suicidal ideation [R45.851] 10/15/2016  . Alcoholic intoxication without complication ([place]) [J28.786]    Total Time spent with patient: 15 minutes  Past Medical History:  Past Medical History:  Diagnosis Date  . Chest pain   . Hepatitis C   . Hypertension   . Irregular heart beat     Past Surgical History:  Procedure Laterality Date  . c sections    . TUBAL LIGATION     Family History: History reviewed. No pertinent family history.  Social History:  History  Alcohol Use No     History  Drug Use  . Types: Cocaine    Social  History   Social History  . Marital status: Legally Separated    Spouse name: N/A  . Number of children: N/A  . Years of education: N/A   Social History Main Topics  . Smoking status: Current Every Day Smoker    Packs/day: 1.00    Types: Cigarettes  . Smokeless tobacco: Never Used  . Alcohol use No  . Drug use: Yes    Types: Cocaine  . Sexual activity: Yes    Birth control/ protection: None   Other Topics Concern  . None   Social History Narrative  . None   Additional Social History:   Sleep: Fair  Appetite:  Fair  Current Medications: Current Facility-Administered Medications  Medication Dose Route Frequency Provider Last Rate Last Dose  . acetaminophen (TYLENOL) tablet 650 mg  650 mg Oral Q6H PRN Ethelene Hal, NP   650 mg at 02/13/17 0940  . alum & mag hydroxide-simeth (MAALOX/MYLANTA) 200-200-20 MG/5ML suspension 30 mL  30 mL Oral Q4H PRN Ethelene Hal, NP      . hydrOXYzine (ATARAX/VISTARIL) tablet 25 mg  25 mg Oral Q6H PRN Ethelene Hal, NP   25 mg at 02/13/17 1521  . hydrOXYzine (ATARAX/VISTARIL) tablet 25 mg  25 mg Oral Q6H PRN Ethelene Hal, NP      . loperamide (IMODIUM) capsule 2-4 mg  2-4 mg Oral PRN Ethelene Hal, NP   4 mg at 02/12/17 7672  . LORazepam (  ATIVAN) tablet 1 mg  1 mg Oral Q6H PRN Ethelene Hal, NP      . LORazepam (ATIVAN) tablet 1 mg  1 mg Oral BID Ethelene Hal, NP       Followed by  . [START ON 02/15/2017] LORazepam (ATIVAN) tablet 1 mg  1 mg Oral Daily Ethelene Hal, NP      . magnesium hydroxide (MILK OF MAGNESIA) suspension 30 mL  30 mL Oral Daily PRN Ethelene Hal, NP   30 mL at 02/13/17 0940  . multivitamin with minerals tablet 1 tablet  1 tablet Oral Daily Ethelene Hal, NP   1 tablet at 02/13/17 7793  . nicotine (NICODERM CQ - dosed in mg/24 hours) patch 21 mg  21 mg Transdermal Daily , Myer Peer, MD   21 mg at 02/13/17 0937  . ondansetron (ZOFRAN-ODT)  disintegrating tablet 4 mg  4 mg Oral Q6H PRN Ethelene Hal, NP      . pneumococcal 23 valent vaccine (PNU-IMMUNE) injection 0.5 mL  0.5 mL Intramuscular Prior to discharge , Myer Peer, MD      . sertraline (ZOLOFT) tablet 50 mg  50 mg Oral Daily , Myer Peer, MD   50 mg at 02/13/17 9688  . thiamine (VITAMIN B-1) tablet 100 mg  100 mg Oral Daily Ethelene Hal, NP   100 mg at 02/13/17 6484  . traZODone (DESYREL) tablet 50 mg  50 mg Oral QHS,MR X 1 Lindon Romp A, NP   50 mg at 02/12/17 2236   Lab Results:  No results found for this or any previous visit (from the past 36 hour(s)).  Blood Alcohol level:  Lab Results  Component Value Date   ETH 72 (H) 02/11/2017   ETH 236 (H) 72/02/2181    Metabolic Disorder Labs: No results found for: HGBA1C, MPG No results found for: PROLACTIN No results found for: CHOL, TRIG, HDL, CHOLHDL, VLDL, LDLCALC  Physical Findings: AIMS: Facial and Oral Movements Muscles of Facial Expression: None, normal Lips and Perioral Area: None, normal Jaw: None, normal Tongue: None, normal,Extremity Movements Upper (arms, wrists, hands, fingers): None, normal Lower (legs, knees, ankles, toes): None, normal, Trunk Movements Neck, shoulders, hips: None, normal, Overall Severity Severity of abnormal movements (highest score from questions above): None, normal Incapacitation due to abnormal movements: None, normal Patient's awareness of abnormal movements (rate only patient's report): No Awareness, Dental Status Current problems with teeth and/or dentures?: No Does patient usually wear dentures?: No  CIWA:  CIWA-Ar Total: 5 COWS:     Musculoskeletal: Strength & Muscle Tone: within normal limits- mild distal tremors Gait & Station: normal Patient leans: N/A  Psychiatric Specialty Exam: Physical Exam  ROS- no chest pain, no shortness of breath, (+) nausea, no vomiting   Blood pressure (!) 126/95, pulse 81, temperature 98.5 F (36.9  C), temperature source Oral, resp. rate 18, height 5' 1" (1.549 m), weight 105.7 kg (233 lb), last menstrual period 02/02/2017, SpO2 100 %.Body mass index is 44.02 kg/m.  General Appearance: Fairly Groomed  Eye Contact:  Good  Speech:  Normal Rate  Volume:  Normal  Mood:  depressed, but improving   Affect:  constricted, vaguely anxious, does smile at times appropriately   Thought Process:  Linear and Descriptions of Associations: Intact  Orientation:  Full (Time, Place, and Person)  Thought Content:  no hallucinations, no delusions, not internally preoccupied   Suicidal Thoughts:  No denies suicidal or self injurious ideations , denies homicidal  or violent ideations  Homicidal Thoughts:  No  Memory:  recent and remote grossly intact   Judgement:  Fair- improving   Insight:  Fair- improving   Psychomotor Activity:  Normal  Concentration:  Concentration: Good and Attention Span: Good  Recall:  Good  Fund of Knowledge:  Good  Language:  Good  Akathisia:  Negative  Handed:  Right  AIMS (if indicated):     Assets:  Communication Skills Desire for Improvement Resilience  ADL's:  Intact  Cognition:  WNL  Sleep:  Number of Hours: 6.5   Assessment - patient presents with mild dysphoria, depression, but denies suicidal ideations. Reports some ongoing symptoms of alcohol withdrawal, such as nausea, feeling clammy and restless. Vitals are stable. She denies SI. At this time tolerating medications well . She is future oriented and interested in going to a rehab setting on discharge.  Treatment Plan Summary: Daily contact with patient to assess and evaluate symptoms and progress in treatment, Medication management, Plan inpatient admission and medications as below.  Will continue today 02/13/17 plan as below except where it is noted.  Encourage group and milieu participation to work on coping skills and symptom reduction  Continue to encourage efforts to work on recovery and relapse  prevention   Patient is interested in going to a rehab setting at discharge treatment team working on disposition planning   Continue Ativan detox protocol to address alcohol withdrawal. Initiated Neurontin 300 mg qid for agitation/alcohol withdrawal syndrome.  Continue Zoloft 50 mgrs QDAY for depression and anxiety  Increased Trazodone to 150 mgrs QHS PRN for insomnia   Encarnacion Slates, NP, PMHNP, FNP-BC 02/13/2017, 4:43 PMPatient ID: Christin Fudge, female   DOB: Jul 21, 1986, 31 y.o.   MRN: [id number] Agree with NP Progress Note

## 2017-02-13 NOTE — Progress Notes (Signed)
Recreation Therapy Notes  Date: 02/13/17 Time: 0930 Location: 300 Hall Dayroom  Group Topic: Stress Management  Goal Area(s) Addresses:  Patient will verbalize importance of using healthy stress management.  Patient will identify positive emotions associated with healthy stress management.   Intervention: Stress Management  Activity :  Progressive Muscle Relaxation.  LRT introduced the stress management technique of progressive muscle relaxation.  LRT read a script that guided patients through steps of tensing and relaxing each muscle group.  Patients were to follow along as LRT read script to fully participate in the technique.  Education:  Stress Management, Discharge Planning.   Education Outcome: Acknowledges edcuation/In group clarification offered/Needs additional education  Clinical Observations/Feedback: Pt did not attend group.   Tangie Stay, LRT/CTRS         Legacy Lacivita A 02/13/2017 11:26 AM 

## 2017-02-13 NOTE — Progress Notes (Signed)
D    Pt is pleasant and cooperative on approach   She complained of some significant withdrawal symptoms including sweats headache anxiety irritability and stomach upset   She attended group and has been socializing appropriately with staff and peers A    Verbal support given    Medications administered and effectiveness monitored   Q 15 min checks   Discussed withdrawal symptoms and medications used to alleviate these symptoms R   Pt is safe at present and verbalized understanding

## 2017-02-13 NOTE — Progress Notes (Signed)
Data. Patient denies SI/HI/AVH. Patient got up and took her AM medications late today. At the beginning of shift patient also denied withdrawal symptoms. At lunch time patient came to the nurse and asked to be moved to the 300 (Detox) hall, "Because I am getting so depressed over here. I just want to stay in bed all day. No one understands what I am going through and I can't talk to people, who are going through the same thing and talk about ways to cope." Patient was moved to 300 hall. By mid afternoon patient was endorsing withdrawal symptoms, such as, "Nausea, but no vomiting and I don't want anything for it." Also endorsing stomach cramps and tremors. C/O constipation, also. Patient also requested testing for, "All the STDs. I have been doing a lot of drugs and I have made some really bad decisions. I need to make sure I am safe." Affect is flat and only minimally brightens with interactions. Patient has been out in the milieu interacting with peers and staff, after move to 300 hall. Action. Emotional support and encouragement offered. Education provided on medication, indications and side effect. Q 15 minute checks done for safety. Response. Safety on the unit maintained through 15 minute checks.  Medications taken as prescribed. Attended groups. Remained calm and appropriate through out shift.

## 2017-02-13 NOTE — Plan of Care (Signed)
Problem: Medication: Goal: Compliance with prescribed medication regimen will improve Outcome: Progressing Patient took medications as prescribed this AM, though slightly late.

## 2017-02-13 NOTE — Tx Team (Signed)
Interdisciplinary Treatment and Diagnostic Plan Update 02/13/2017 Time of Session: 9:30am  Shirley Murphy  MRN: [id number]  Principal Diagnosis: Alcohol Use Disorder, Alcohol Induced Mood Disorder, Depressed   Secondary Diagnoses: Active Problems:   MDD (major depressive disorder), recurrent severe, without psychosis ([place])   Current Medications:  Current Facility-Administered Medications  Medication Dose Route Frequency Provider Last Rate Last Dose  . acetaminophen (TYLENOL) tablet 650 mg  650 mg Oral Q6H PRN Ethelene Hal, NP   650 mg at 02/13/17 0940  . alum & mag hydroxide-simeth (MAALOX/MYLANTA) 200-200-20 MG/5ML suspension 30 mL  30 mL Oral Q4H PRN Ethelene Hal, NP      . hydrOXYzine (ATARAX/VISTARIL) tablet 25 mg  25 mg Oral Q6H PRN Ethelene Hal, NP   25 mg at 02/12/17 2125  . hydrOXYzine (ATARAX/VISTARIL) tablet 25 mg  25 mg Oral Q6H PRN Ethelene Hal, NP      . loperamide (IMODIUM) capsule 2-4 mg  2-4 mg Oral PRN Ethelene Hal, NP   4 mg at 02/12/17 6644  . LORazepam (ATIVAN) tablet 1 mg  1 mg Oral Q6H PRN Ethelene Hal, NP      . LORazepam (ATIVAN) tablet 1 mg  1 mg Oral BID Ethelene Hal, NP       Followed by  . [START ON 02/15/2017] LORazepam (ATIVAN) tablet 1 mg  1 mg Oral Daily Ethelene Hal, NP      . magnesium hydroxide (MILK OF MAGNESIA) suspension 30 mL  30 mL Oral Daily PRN Ethelene Hal, NP   30 mL at 02/13/17 0940  . multivitamin with minerals tablet 1 tablet  1 tablet Oral Daily Ethelene Hal, NP   1 tablet at 02/13/17 0347  . nicotine (NICODERM CQ - dosed in mg/24 hours) patch 21 mg  21 mg Transdermal Daily Cobos, Myer Peer, MD   21 mg at 02/13/17 0937  . ondansetron (ZOFRAN-ODT) disintegrating tablet 4 mg  4 mg Oral Q6H PRN Ethelene Hal, NP      . pneumococcal 23 valent vaccine (PNU-IMMUNE) injection 0.5 mL  0.5 mL Intramuscular Prior to discharge Cobos, Myer Peer, MD       . sertraline (ZOLOFT) tablet 50 mg  50 mg Oral Daily Cobos, Myer Peer, MD   50 mg at 02/13/17 4259  . thiamine (VITAMIN B-1) tablet 100 mg  100 mg Oral Daily Ethelene Hal, NP   100 mg at 02/13/17 5638  . traZODone (DESYREL) tablet 50 mg  50 mg Oral QHS,MR X 1 Lindon Romp A, NP   50 mg at 02/12/17 2236    PTA Medications: Prescriptions Prior to Admission  Medication Sig Dispense Refill Last Dose  . hydrOXYzine (ATARAX/VISTARIL) 25 MG tablet Take 1 tablet (25 mg total) by mouth every 6 (six) hours as needed for anxiety. (Patient not taking: Reported on 02/11/2017) 30 tablet 0 Not Taking at Unknown time  . nicotine (NICODERM CQ - DOSED IN MG/24 HOURS) 21 mg/24hr patch Place 1 patch (21 mg total) onto the skin daily. (Patient not taking: Reported on 02/11/2017) 28 patch 0 Not Taking at Unknown time  . pantoprazole (PROTONIX) 40 MG tablet Take 1 tablet (40 mg total) by mouth daily. (Patient not taking: Reported on 02/11/2017) 30 tablet 0 Not Taking at Unknown time  . QUEtiapine (SEROQUEL) 300 MG tablet Take 1 tablet (300 mg total) by mouth at bedtime. (Patient not taking: Reported on 02/11/2017) 30 tablet 0 Not Taking at Unknown time  .  sertraline (ZOLOFT) 50 MG tablet Take 1 tablet (50 mg total) by mouth daily. (Patient not taking: Reported on 02/11/2017) 30 tablet 0 Not Taking at Unknown time    Treatment Modalities: Medication Management, Group therapy, Case management,  1 to 1 session with clinician, Psychoeducation, Recreational therapy.  Patient Stressors: Financial difficulties Loss of children Medication change or noncompliance Substance abuse Patient Strengths: Ability for insight Capable of independent living  Physician Treatment Plan for Primary Diagnosis: Alcohol Use Disorder, Alcohol Induced Mood Disorder, Depressed  Long Term Goal(s): Improvement in symptoms so as ready for discharge Short Term Goals: Ability to verbalize feelings will improve Ability to disclose and  discuss suicidal ideas Ability to demonstrate self-control will improve Ability to identify and develop effective coping behaviors will improve Ability to maintain clinical measurements within normal limits will improve Ability to identify triggers associated with substance abuse/mental health issues will improve  Medication Management: Evaluate patient's response, side effects, and tolerance of medication regimen.  Therapeutic Interventions: 1 to 1 sessions, Unit Group sessions and Medication administration.  Evaluation of Outcomes: Not Met  Physician Treatment Plan for Secondary Diagnosis: Active Problems:   MDD (major depressive disorder), recurrent severe, without psychosis ([place])  Long Term Goal(s): Improvement in symptoms so as ready for discharge  Short Term Goals: Ability to verbalize feelings will improve Ability to disclose and discuss suicidal ideas Ability to demonstrate self-control will improve Ability to identify and develop effective coping behaviors will improve Ability to maintain clinical measurements within normal limits will improve Ability to identify triggers associated with substance abuse/mental health issues will improve  Medication Management: Evaluate patient's response, side effects, and tolerance of medication regimen.  Therapeutic Interventions: 1 to 1 sessions, Unit Group sessions and Medication administration.  Evaluation of Outcomes: Not Met  RN Treatment Plan for Primary Diagnosis: Alcohol Use Disorder, Alcohol Induced Mood Disorder, Depressed  Long Term Goal(s): Knowledge of disease and therapeutic regimen to maintain health will improve  Short Term Goals: Compliance with prescribed medications will improve  Medication Management: RN will administer medications as ordered by provider, will assess and evaluate patient's response and provide education to patient for prescribed medication. RN will report any adverse and/or side effects to prescribing  provider.  Therapeutic Interventions: 1 on 1 counseling sessions, Psychoeducation, Medication administration, Evaluate responses to treatment, Monitor vital signs and CBGs as ordered, Perform/monitor CIWA, COWS, AIMS and Fall Risk screenings as ordered, Perform wound care treatments as ordered.  Evaluation of Outcomes: Not Met  LCSW Treatment Plan for Primary Diagnosis: Alcohol Use Disorder, Alcohol Induced Mood Disorder, Depressed  Long Term Goal(s): Safe transition to appropriate next level of care at discharge, Engage patient in therapeutic group addressing interpersonal concerns. Short Term Goals: Engage patient in aftercare planning with referrals and resources, Facilitate patient progression through stages of change regarding substance use diagnoses and concerns, Identify triggers associated with mental health/substance abuse issues and Increase skills for wellness and recovery  Therapeutic Interventions: Assess for all discharge needs, 1 to 1 time with Social worker, Explore available resources and support systems, Assess for adequacy in community support network, Educate family and significant other(s) on suicide prevention, Complete Psychosocial Assessment, Interpersonal group therapy.  Evaluation of Outcomes: Not Met  Progress in Treatment: Attending groups: Pt is new to milieu, continuing to assess  Participating in groups: Pt is new to milieu, continuing to assess  Taking medication as prescribed: Yes, MD continues to assess for medication changes as needed Toleration medication: Yes, no side effects reported at  this time Family/Significant other contact made: No, CSW will make contact if pt consents. Patient understands diagnosis: Continuing to assess Discussing patient identified problems/goals with staff: Yes Medical problems stabilized or resolved: Yes Denies suicidal/homicidal ideation: No, pt recently admitted for SI. Issues/concerns per patient self-inventory: None Other:  N/A  New problem(s) identified: None identified at this time.   New Short Term/Long Term Goal(s): None identified at this time.   Discharge Plan or Barriers: Pt will discharge to [facility] or [facility] for treatment.  Reason for Continuation of Hospitalization:   Depression Medication stabilization Suicidal ideation Withdrawal symptoms  Estimated Length of Stay: 3-5 days; Estimated discharge date 02/18/17  Attendees: Patient: 02/13/2017 10:52 AM  Physician: Dr. Parke Poisson 02/13/2017 10:52 AM  Nursing: Kieth Brightly RN; [place], RN 02/13/2017 10:52 AM  RN Care Manager:  02/13/2017 10:52 AM  Social Worker: Matthew Saras, [place] 02/13/2017 10:52 AM  Recreational Therapist:  02/13/2017 10:52 AM  Other: Lindell Spar, NP 02/13/2017 10:52 AM  Other:  02/13/2017 10:52 AM  Other: 02/13/2017 10:52 AM  Scribe for Treatment Team: Georga Kaufmann, MSW,LCSWA 02/13/2017 10:52 AM

## 2017-02-14 DIAGNOSIS — F1990 Other psychoactive substance use, unspecified, uncomplicated: Secondary | ICD-10-CM

## 2017-02-14 NOTE — Progress Notes (Signed)
NSG 7a-7p shift:   D:  Pt. Had been somewhat brighter in affect until visitation time when her boyfriend did not show up as promised.  Pt had multiple somatic complaints and was given PRN's as needed with good effect.  Pt states that people seem to always let her down, even when she is there for them.  She has attended groups and has been supportive to her peers but acknowledges that she would like more support.    A: Support, education, and encouragement provided as needed.  Level 3 checks continued for safety.  R: Pt.  receptive to intervention/s.  Safety maintained.  Joaquin MusicMary Tippi Mccrae, RN

## 2017-02-14 NOTE — Plan of Care (Signed)
Problem: Activity: Goal: Interest or engagement in activities will improve Outcome: Progressing Pt in dayroom talking to peers Goal: Sleeping patterns will improve Outcome: Progressing Pt slept 6.25 hrs last night  Problem: Education: Goal: Emotional status will improve Outcome: Progressing Pt stated she was depressed and did not want to talk  Problem: Coping: Goal: Ability to verbalize frustrations and anger appropriately will improve Outcome: Progressing Pt stated she was depressed and did not want to talk Goal: Ability to demonstrate self-control will improve Outcome: Progressing Pt did not have any visible outburst this evening  Problem: Safety: Goal: Periods of time without injury will increase Outcome: Progressing Pt safe on the unit at this time  Problem: Safety: Goal: Ability to remain free from injury will improve Outcome: Progressing Pt safe at this time  Problem: Coping: Goal: Ability to cope will improve Outcome: Progressing Pt did not want to talk to writer but was seen talking to peers  Problem: Medication: Goal: Compliance with prescribed medication regimen will improve Outcome: Progressing Pt taking medications as prescribed  Problem: Self-Concept: Goal: Ability to disclose and discuss suicidal ideas will improve Outcome: Progressing Pt denies SI at this time  Problem: Activity: Goal: Interest or engagement in leisure activities will improve Outcome: Progressing Pt seen in the dayroom talking and interacting with peers Goal: Imbalance in normal sleep/wake cycle will improve Outcome: Progressing Pt slept 6.25 hrs last night

## 2017-02-14 NOTE — [facility] Group Notes (Signed)
[facility] LCSW Group Therapy Note  03/01/2016 at 10:15 AM  Type of Therapy and Topic:  Group Therapy: Avoiding Self-Sabotaging and Enabling Behaviors  Participation Level:  Active  Participation Quality:  Attentive and Sharing  Affect:  Depressed  Cognitive:  Alert  Insight:  Limited  Engagement in Therapy:  Limited   Therapeutic models used: Cognitive Behavioral Therapy,  Person-Centered Therapy and Motivational Interviewing  Modes of Intervention:  Discussion, Exploration, Orientation, Rapport Building, Socialization and Support  Summary of Progress/Problems:  The main focus of today's process group was for the patient to identify ways in which they have in the past perhaps sabotaged their own recovery. Motivational Interviewing was utilized to identify what might be a sign they were getting better and what might motivate them to become more engaged with change. Patient shared her difficulty avoiding expecting the worst due to previous experiences.    Carney Bernatherine C Harrill, LCSW

## 2017-02-14 NOTE — Progress Notes (Signed)
[facility] Group Notes:  (Nursing/MHT/Case Management/Adjunct)  Date:  02/14/2017  Time: 2045  Type of Therapy:  wrap up group  Participation Level:  Active  Participation Quality:  Appropriate, Attentive, Sharing and Supportive  Affect:  Anxious and Appropriate  Cognitive:  Appropriate  Insight:  Improving  Engagement in Group:  Engaged  Modes of Intervention:  Clarification, Education and Support  Summary of Progress/Problems: Pt reports enjoying connecting with her peers today and helping them helps her. Pt felt disappointed that her visitor didn't show up and shared that she hates broken promises and being lied to. Pt wants to go to rehab after discharge and shared that she is grateful of the many gifts that god gave to her. Ex.roofing and singing  Marcille BuffyMcNeil, Noele Icenhour S 02/14/2017, 9:38 PM

## 2017-02-14 NOTE — Progress Notes (Signed)
D: Pt denies SI/HI/AVH. "I'm just depressed, If I feel like talking I will find you". Pt very avoidant and did not want to talk to Clinical research associatewriter. Pt seen interacting with peers   A: Pt was offered support and encouragement. Pt was given scheduled medications. Pt was encourage to attend groups. Q 15 minute checks were done for safety.   R: Pt is taking medication. Pt has no complaints.Pt receptive to treatment and safety maintained on unit.

## 2017-02-14 NOTE — Progress Notes (Signed)
[facility] MD Progress Note  02/14/2017 1:12 PM Shirley Murphy  MRN:  [id number]  Subjective:  Shirley Murphy reports, anxiety and feeling aches , dysphoric but not hopeless   Objective: Shirley Murphy is seen, chart reviewed. I have discussed case with treatment team and have met with patient . Patient presents some dysphoria , anxiety. Overall some better and tolerating detox Denies hopelessness or psychotic symptoms Attending groups and not isolating   Principal Problem:  Alcohol Dependence  Diagnosis:   Patient Active Problem List   Diagnosis Date Noted  . MDD (major depressive disorder), recurrent severe, without psychosis ([place]) [F33.2] 10/17/2016  . Suicide attempt ([place]) [T14.91XA] 10/15/2016  . HTN (hypertension), benign [I10] 10/15/2016  . Irregular heart beat [I49.9] 10/15/2016  . Suicidal ideation [R45.851] 10/15/2016  . Alcoholic intoxication without complication ([place]) [Z16.967]    Total Time spent with patient: 15 minutes  Past Medical History:  Past Medical History:  Diagnosis Date  . Chest pain   . Hepatitis C   . Hypertension   . Irregular heart beat     Past Surgical History:  Procedure Laterality Date  . c sections    . TUBAL LIGATION     Family History: History reviewed. No pertinent family history.  Social History:  History  Alcohol Use No     History  Drug Use  . Types: Cocaine    Social History   Social History  . Marital status: Legally Separated    Spouse name: N/A  . Number of children: N/A  . Years of education: N/A   Social History Main Topics  . Smoking status: Current Every Day Smoker    Packs/day: 1.00    Types: Cigarettes  . Smokeless tobacco: Never Used  . Alcohol use No  . Drug use: Yes    Types: Cocaine  . Sexual activity: Yes    Birth control/ protection: None   Other Topics Concern  . None   Social History Narrative  . None   Additional Social History:   Sleep: Fair  Appetite:  Fair  Current Medications: Current  Facility-Administered Medications  Medication Dose Route Frequency Provider Last Rate Last Dose  . acetaminophen (TYLENOL) tablet 650 mg  650 mg Oral Q6H PRN Ethelene Hal, NP   650 mg at 02/13/17 0940  . alum & mag hydroxide-simeth (MAALOX/MYLANTA) 200-200-20 MG/5ML suspension 30 mL  30 mL Oral Q4H PRN Ethelene Hal, NP      . gabapentin (NEURONTIN) capsule 300 mg  300 mg Oral TID PC & HS Nwoko, Agnes I, NP   300 mg at 02/14/17 0816  . hydrOXYzine (ATARAX/VISTARIL) tablet 25 mg  25 mg Oral Q6H PRN Ethelene Hal, NP   25 mg at 02/14/17 0820  . [START ON 02/15/2017] LORazepam (ATIVAN) tablet 1 mg  1 mg Oral Daily Ethelene Hal, NP      . magnesium hydroxide (MILK OF MAGNESIA) suspension 30 mL  30 mL Oral Daily PRN Ethelene Hal, NP   30 mL at 02/13/17 0940  . multivitamin with minerals tablet 1 tablet  1 tablet Oral Daily Ethelene Hal, NP   1 tablet at 02/14/17 8938  . nicotine (NICODERM CQ - dosed in mg/24 hours) patch 21 mg  21 mg Transdermal Daily Cobos, Myer Peer, MD   21 mg at 02/14/17 1017  . pneumococcal 23 valent vaccine (PNU-IMMUNE) injection 0.5 mL  0.5 mL Intramuscular Prior to discharge Cobos, Myer Peer, MD      . sertraline (  ZOLOFT) tablet 50 mg  50 mg Oral Daily Cobos, Myer Peer, MD   50 mg at 02/14/17 0817  . thiamine (VITAMIN B-1) tablet 100 mg  100 mg Oral Daily Ethelene Hal, NP   100 mg at 02/14/17 0818  . traZODone (DESYREL) tablet 150 mg  150 mg Oral QHS Lindell Spar I, NP   150 mg at 02/13/17 2145   Lab Results:  No results found for this or any previous visit (from the past 48 hour(s)).  Blood Alcohol level:  Lab Results  Component Value Date   ETH 72 (H) 02/11/2017   ETH 236 (H) 23/76/2831    Metabolic Disorder Labs: No results found for: HGBA1C, MPG No results found for: PROLACTIN No results found for: CHOL, TRIG, HDL, CHOLHDL, VLDL, LDLCALC  Physical Findings: AIMS: Facial and Oral Movements Muscles of  Facial Expression: None, normal Lips and Perioral Area: None, normal Jaw: None, normal Tongue: None, normal,Extremity Movements Upper (arms, wrists, hands, fingers): None, normal Lower (legs, knees, ankles, toes): None, normal, Trunk Movements Neck, shoulders, hips: None, normal, Overall Severity Severity of abnormal movements (highest score from questions above): None, normal Incapacitation due to abnormal movements: None, normal Patient's awareness of abnormal movements (rate only patient's report): No Awareness, Dental Status Current problems with teeth and/or dentures?: No Does patient usually wear dentures?: No  CIWA:  CIWA-Ar Total: 8 COWS:     Musculoskeletal: Strength & Muscle Tone: within normal limits- mild distal tremors Gait & Station: normal Patient leans: N/A  Psychiatric Specialty Exam: Physical Exam  Review of Systems  Cardiovascular: Negative for chest pain.  Skin: Negative for rash.  Psychiatric/Behavioral: Positive for depression and substance abuse. The patient is nervous/anxious.   - no chest pain, no shortness of breath, (+) nausea, no vomiting   Blood pressure 129/83, pulse 76, temperature 97.6 F (36.4 C), temperature source Oral, resp. rate 20, height _0  (1.549 m), weight 105.7 kg (233 lb), last menstrual period 02/02/2017, SpO2 100 %.Body mass index is 44.02 kg/m.  General Appearance: Fairly Groomed  Eye Contact:  Good  Speech:  Normal Rate  Volume:  Normal  Mood:  dysphoric  Affect:  constricted  Thought Process:  Linear and Descriptions of Associations: Intact  Orientation:  Full (Time, Place, and Person)  Thought Content:  no hallucinations, no delusions, not internally preoccupied   Suicidal Thoughts:  No denies suicidal or self injurious ideations , denies homicidal or violent ideations  Homicidal Thoughts:  No  Memory:  recent and remote grossly intact   Judgement:  Fair- improving   Insight:  Fair- improving   Psychomotor Activity:   Normal  Concentration:  Concentration: Good and Attention Span: Good  Recall:  Good  Fund of Knowledge:  Good  Language:  Good  Akathisia:  Negative  Handed:  Right  AIMS (if indicated):     Assets:  Communication Skills Desire for Improvement Resilience  ADL's:  Intact  Cognition:  WNL  Sleep:  Number of Hours: 6.25   Assessment - remains dysphoric, tolerating detox. Some improvement  Treatment Plan Summary: Daily contact with patient to assess and evaluate symptoms and progress in treatment, Medication management, Plan inpatient admission and medications as below.   Patient is interested in going to a rehab setting at discharge treatment team working on disposition planning  Substance use disorder: continue ativan detox protocol. Monitor vitals  Continue neurontin for agiatation/ anxiety  Mood symptoms: continue zoloft  InsomniaL continue trazadone   De Nurse, Eula Mazzola, MD,  PMHNP, FNP-BC 02/14/2017, 1:12 PM

## 2017-02-15 MED ORDER — NAPROXEN 500 MG PO TABS
500.0000 mg | ORAL_TABLET | Freq: Two times a day (BID) | ORAL | Status: DC | PRN
  Administered 2017-02-15 – 2017-02-18 (×8): 500 mg via ORAL
  Filled 2017-02-15 (×8): qty 1

## 2017-02-15 NOTE — Progress Notes (Signed)
Patient ID: Shirley Murphy, female   DOB: [birth year], 31 y.o.   MRN: [id number]  DAR: Pt. Denies SI/HI and A/V Hallucinations. She appears depressed in mood and affect. She reports sleep is poor, appetite is good, energy level is normal, and concentration is good. She rates depression 6/10, hopelessness 6/10, and anxiety 8/10. Patient does report pain in her knee that is chronic in nature and is receiving PRN medications for this. These medications have provided some relief. Patient reports withdrawal symptoms including tremors, cravings, and irritability. She reports, "I just want a drink." She speaks about her discharge plan, stating that she would like to go to an inpatient treatment center for continued support in her recovery. Support and encouragement provided to the patient by staff throughout the day. Scheduled medications administered to patient per physician's orders. Patient is receptive and cooperative. She is seen in the milieu and attended morning group. Q15 minute checks are maintained for safety.

## 2017-02-15 NOTE — Progress Notes (Signed)
Patient did attend the evening speaker AA meeting.  

## 2017-02-15 NOTE — Progress Notes (Signed)
[facility] MD Progress Note  02/15/2017 2:15 PM Shirley Murphy  MRN:  [id number]  Subjective:  Shirley Murphy reports, anxiety and feeling aches , dysphoric but not hopeless   Objective: Shirley Murphy is seen, chart reviewed. I have discussed case with treatment team and have met with patient . Patient still dysphoric, but remorseful. Tolerating medications.  Says has had unprotected sex and wants to do STD eval Anxiety less. Tolerating detox   Principal Problem:  Alcohol Dependence  Diagnosis:   Patient Active Problem List   Diagnosis Date Noted  . MDD (major depressive disorder), recurrent severe, without psychosis ([place]) [F33.2] 10/17/2016  . Suicide attempt ([place]) [T14.91XA] 10/15/2016  . HTN (hypertension), benign [I10] 10/15/2016  . Irregular heart beat [I49.9] 10/15/2016  . Suicidal ideation [R45.851] 10/15/2016  . Alcoholic intoxication without complication ([place]) [X93.716]    Total Time spent with patient: 15 minutes  Past Medical History:  Past Medical History:  Diagnosis Date  . Chest pain   . Hepatitis C   . Hypertension   . Irregular heart beat     Past Surgical History:  Procedure Laterality Date  . c sections    . TUBAL LIGATION     Family History: History reviewed. No pertinent family history.  Social History:  History  Alcohol Use No     History  Drug Use  . Types: Cocaine    Social History   Social History  . Marital status: Legally Separated    Spouse name: N/A  . Number of children: N/A  . Years of education: N/A   Social History Main Topics  . Smoking status: Current Every Day Smoker    Packs/day: 1.00    Types: Cigarettes  . Smokeless tobacco: Never Used  . Alcohol use No  . Drug use: Yes    Types: Cocaine  . Sexual activity: Yes    Birth control/ protection: None   Other Topics Concern  . None   Social History Narrative  . None   Additional Social History:   Sleep: Fair  Appetite:  Fair  Current Medications: Current Facility-Administered  Medications  Medication Dose Route Frequency Provider Last Rate Last Dose  . acetaminophen (TYLENOL) tablet 650 mg  650 mg Oral Q6H PRN Ethelene Hal, NP   650 mg at 02/15/17 0826  . alum & mag hydroxide-simeth (MAALOX/MYLANTA) 200-200-20 MG/5ML suspension 30 mL  30 mL Oral Q4H PRN Ethelene Hal, NP      . gabapentin (NEURONTIN) capsule 300 mg  300 mg Oral TID PC & HS Nwoko, Agnes I, NP   300 mg at 02/15/17 1207  . hydrOXYzine (ATARAX/VISTARIL) tablet 25 mg  25 mg Oral Q6H PRN Ethelene Hal, NP   25 mg at 02/15/17 0935  . magnesium hydroxide (MILK OF MAGNESIA) suspension 30 mL  30 mL Oral Daily PRN Ethelene Hal, NP   30 mL at 02/14/17 1318  . multivitamin with minerals tablet 1 tablet  1 tablet Oral Daily Ethelene Hal, NP   1 tablet at 02/15/17 0825  . naproxen (NAPROSYN) tablet 500 mg  500 mg Oral BID PRN Derrill Center, NP   500 mg at 02/15/17 1010  . nicotine (NICODERM CQ - dosed in mg/24 hours) patch 21 mg  21 mg Transdermal Daily Cobos, Myer Peer, MD   21 mg at 02/15/17 0825  . pneumococcal 23 valent vaccine (PNU-IMMUNE) injection 0.5 mL  0.5 mL Intramuscular Prior to discharge Cobos, Myer Peer, MD      .  sertraline (ZOLOFT) tablet 50 mg  50 mg Oral Daily Cobos, Myer Peer, MD   50 mg at 02/15/17 0825  . thiamine (VITAMIN B-1) tablet 100 mg  100 mg Oral Daily Ethelene Hal, NP   100 mg at 02/15/17 0825  . traZODone (DESYREL) tablet 150 mg  150 mg Oral QHS Lindell Spar I, NP   150 mg at 02/14/17 2139   Lab Results:  No results found for this or any previous visit (from the past 48 hour(s)).  Blood Alcohol level:  Lab Results  Component Value Date   ETH 72 (H) 02/11/2017   ETH 236 (H) 34/19/3790    Metabolic Disorder Labs: No results found for: HGBA1C, MPG No results found for: PROLACTIN No results found for: CHOL, TRIG, HDL, CHOLHDL, VLDL, LDLCALC  Physical Findings: AIMS: Facial and Oral Movements Muscles of Facial Expression:  None, normal Lips and Perioral Area: None, normal Jaw: None, normal Tongue: None, normal,Extremity Movements Upper (arms, wrists, hands, fingers): None, normal Lower (legs, knees, ankles, toes): None, normal, Trunk Movements Neck, shoulders, hips: None, normal, Overall Severity Severity of abnormal movements (highest score from questions above): None, normal Incapacitation due to abnormal movements: None, normal Patient's awareness of abnormal movements (rate only patient's report): No Awareness, Dental Status Current problems with teeth and/or dentures?: No Does patient usually wear dentures?: No  CIWA:  CIWA-Ar Total: 1 COWS:     Musculoskeletal: Strength & Muscle Tone: within normal limits- mild distal tremors Gait & Station: normal Patient leans: N/A  Psychiatric Specialty Exam: Physical Exam  Review of Systems  Cardiovascular: Negative for palpitations.  Skin: Negative for rash.  Psychiatric/Behavioral: Positive for depression. The patient is nervous/anxious.   - no chest pain, no shortness of breath, (+) nausea, no vomiting   Blood pressure 135/82, pulse 89, temperature 97.3 F (36.3 C), temperature source Oral, resp. rate 16, height '5\' 1"'  (1.549 m), weight 105.7 kg (233 lb), last menstrual period 02/02/2017, SpO2 100 %.Body mass index is 44.02 kg/m.  General Appearance: Fairly Groomed  Eye Contact:  Good  Speech:  Normal Rate  Volume:  Normal  Mood: less dysphoric  Affect:  constricted  Thought Process:  Linear and Descriptions of Associations: Intact  Orientation:  Full (Time, Place, and Person)  Thought Content:  no hallucinations, no delusions, not internally preoccupied   Suicidal Thoughts:  No denies suicidal or self injurious ideations , denies homicidal or violent ideations  Homicidal Thoughts:  No  Memory:  recent and remote grossly intact   Judgement:  Fair- improving   Insight:  Fair- improving   Psychomotor Activity:  Normal  Concentration:   Concentration: Good and Attention Span: Good  Recall:  Good  Fund of Knowledge:  Good  Language:  Good  Akathisia:  Negative  Handed:  Right  AIMS (if indicated):     Assets:  Communication Skills Desire for Improvement Resilience  ADL's:  Intact  Cognition:  WNL  Sleep:  Number of Hours: 6.5   Assessment - remains dysphoric, tolerating detox. Some improvement  Treatment Plan Summary: Daily contact with patient to assess and evaluate symptoms and progress in treatment, Medication management, Plan inpatient admission and medications as below.   Patient is interested in going to a rehab setting at discharge treatment team working on disposition planning  Substance use disorder: continue prn ativan detox  Will reqeust for STD eval panel. Continue neurontin for agiatation/ anxiety  Mood symptoms: continue zoloft  InsomniaL continue trazadone   Lucely Leard,  MD, PMHNP, FNP-BC 02/15/2017, 2:15 PM

## 2017-02-15 NOTE — [facility] Group Notes (Signed)
[facility] LCSW Group Therapy  02/15/2017   Type of Therapy:  Group Therapy  Participation Level:  Active  Participation Quality:  Appropriate and Attentive  Affect:  Appropriate  Cognitive:  Alert and Oriented  Insight:  Improving  Engagement in Therapy:  Improving  Modes of Intervention:  Discussion  Today's group was about using different tools to prepare for successful discharge. Facilitator identified three major areas to review. One was supports at discharge. Another area was utilizing treatment planning and services. Finally identifying self-care goals in order to have clear directive for ongoing progress. Patients were able to engage well and identify how to develop these key tools for a good discharge. Patient was engaged and supported others in the group and challenged concepts presented. Patient showed her own openness to understand and accept new ideas to begin treatment planning. Patient had a very difficult history with a therapist who would not work with her due to her past. Patient identified that she felt encouraged to find a new therapist.   Beverly Sessionsywan J Jamiaya Bina MSW, LCSW

## 2017-02-15 NOTE — [facility] Group Notes (Signed)
[facility] Group Notes:  [facility] Support Systems   Date:  02/15/2017  Time:  3:54 PM  Type of Therapy:  Psychoeducational Skills  Participation Level:  Active  Participation Quality:  Appropriate and Attentive  Affect:  Anxious and Tearful  Cognitive:  Alert and Appropriate  Insight:  Improving  Engagement in Group:  Engaged  Modes of Intervention:  Discussion and Education  Summary of Progress/Problems: Shirley Murphy attended group and participated. She reported that she would like to go to a "rehab" [facility]. She reports, "I need to go straight from here or I'll relapse. I know me." She looks forward to speaking with LCSW about her discharge plan.  Marzetta BoardDopson, Jacquese Hackman E 02/15/2017, 3:54 PM

## 2017-02-16 ENCOUNTER — Encounter (HOSPITAL_COMMUNITY): Payer: Self-pay | Admitting: Behavioral Health

## 2017-02-16 LAB — RPR: RPR Ser Ql: NONREACTIVE

## 2017-02-16 LAB — HIV ANTIBODY (ROUTINE TESTING W REFLEX): HIV Screen 4th Generation wRfx: NONREACTIVE

## 2017-02-16 MED ORDER — QUETIAPINE FUMARATE 25 MG PO TABS
25.0000 mg | ORAL_TABLET | Freq: Every day | ORAL | Status: DC
  Filled 2017-02-16 (×2): qty 1

## 2017-02-16 MED ORDER — QUETIAPINE FUMARATE 100 MG PO TABS
100.0000 mg | ORAL_TABLET | Freq: Every evening | ORAL | Status: DC | PRN
  Administered 2017-02-16 (×2): 100 mg via ORAL
  Filled 2017-02-16 (×8): qty 1

## 2017-02-16 MED ORDER — QUETIAPINE FUMARATE ER 50 MG PO TB24: 25.0000 mg | ORAL_TABLET | Freq: Every day | ORAL | Status: DC

## 2017-02-16 NOTE — [facility] Group Notes (Signed)
[facility] LCSW Group Therapy  02/16/2017 3:39 PM  Type of Therapy:  Group Therapy  Participation Level:  Active  Participation Quality:  Attentive  Affect:  Appropriate  Cognitive:  Alert and Oriented  Insight:  Engaged  Engagement in Therapy:  Engaged  Modes of Intervention:  Confrontation, Discussion, Education, Socialization and Support  Summary of Progress/Problems: Today's Topic: Overcoming Obstacles. Patients identified one short term goal and potential obstacles in reaching this goal. Patients processed barriers involved in overcoming these obstacles. Patients identified steps necessary for overcoming these obstacles and explored motivation (internal and external) for facing these difficulties head on.   Bradie Lacock N Smart LCSW 02/16/2017, 3:39 PM

## 2017-02-16 NOTE — Progress Notes (Signed)
Patient ID: Shirley Murphy, female   DOB: 1986-03-20, 31 y.o.   MRN: [id number]  DAR: Pt. Denies SI/HI and A/V Hallucinations. She reports sleep is fair, appetite is good, energy level is normal, and concentration is good. She rates depression 4/10, hopelessness 4/10, and anxiety 6/10. She received PRN Vistaril this morning. Patient does report continued pain in her knee and received PRN Naprosyn. Support and encouragement provided to the patient. Writer spent time with patient 1:1 this morning after patient reports waking up from a dream where she saw her children. She began to cry when talking about them and states, "I went to jail and by the time I got out they had already been adopted and my parental rights were gone." She reports that it was a closed adoption and therefore cannot speak to them until they turn 18. She reports that she thinks about them a lot and receives a picture of them once a year. She stated after she was charged with "manslaughter" she lost hope and stated, "I gave up on everything. I didn't care." She reports she has used substances since she was 31 years old when her mother began giving them to her and her two sisters. She reports that she has never felt good enough because she reports her mom always told her she was too fat and ugly to ever "get a man." Patient relates this to her current self esteem and reports that her mom has contributed to this. She reports, "I want to get better." She reports that she is interested in an extended treatment program. Patient is receptive and cooperative. She is seen in the milieu intermittently and interacting appropriately with her peers. Q15 minute checks are maintained for safety.

## 2017-02-16 NOTE — Progress Notes (Signed)
D: Pt denies SI/HI/AVH. Pt is pleasant and cooperative. Pt stated she felt a lot better being sober, feels more responsible for her actions and wanted to start taking more control of her life.   A: Pt was offered support and encouragement. Pt was given scheduled medications. Pt was encourage to attend groups. Q 15 minute checks were done for safety.   R:Pt attends groups and interacts well with peers and staff. Pt is taking medication. Pt has no complaints.Pt receptive to treatment and safety maintained on unit.

## 2017-02-16 NOTE — Progress Notes (Signed)
[facility] MD Progress Note  02/16/2017 11:17 AM Shirley Murphy  MRN:  [id number]  Subjective:  Shirley Murphy reports, " Things are better. My depression is better and I am learning ho to cope with things."   Objective: Face to face evaluation completed and chart reviewed. During this evaluation, patient is alert and oriented x4, calm,and cooperative. She endorses overall improvement in depressive symptoms yet reports a heightened level of anxiety.  She reports anxiety is secondary to being discharged and being afraid that once she returns home, she may relapse. She presented to [facility] with a history of alcohol abuse. She endorses the desire to go to [facility] once discharged. Denies withdrawal symptoms at this time. Denies active or passive SI, HI, or AVH. She does not appear internally preoccupied. Endorses poor sleeping pattern with the administration of Trazodone 150 mg. Reports using Seroquel in the past for insomnia management with good results. Endorses fair appetite. Appears to be tolerating medications well without reported side effects. At this time, she is able to contract for safety on the unit.    Principal Problem:  Alcohol Dependence  Diagnosis:   Patient Active Problem List   Diagnosis Date Noted  . MDD (major depressive disorder), recurrent severe, without psychosis (HCC) [F33.2] 10/17/2016  . Suicide attempt (HCC) [T14.91XA] 10/15/2016  . HTN (hypertension), benign [I10] 10/15/2016  . Irregular heart beat [I49.9] 10/15/2016  . Suicidal ideation [R45.851] 10/15/2016  . Alcoholic intoxication without complication (HCC) [F10.920]    Total Time spent with patient: 20 minutes  Past Medical History:  Past Medical History:  Diagnosis Date  . Chest pain   . Hepatitis C   . Hypertension   . Irregular heart beat     Past Surgical History:  Procedure Laterality Date  . c sections    . TUBAL LIGATION     Family History: History reviewed. No pertinent family history.  Social History:  History   Alcohol Use No     History  Drug Use  . Types: Cocaine    Social History   Social History  . Marital status: Legally Separated    Spouse name: N/A  . Number of children: N/A  . Years of education: N/A   Social History Main Topics  . Smoking status: Current Every Day Smoker    Packs/day: 1.00    Types: Cigarettes  . Smokeless tobacco: Never Used  . Alcohol use No  . Drug use: Yes    Types: Cocaine  . Sexual activity: Yes    Birth control/ protection: None   Other Topics Concern  . None   Social History Narrative  . None   Additional Social History:   Sleep: Poor  Appetite:  Fair  Current Medications: Current Facility-Administered Medications  Medication Dose Route Frequency Provider Last Rate Last Dose  . acetaminophen (TYLENOL) tablet 650 mg  650 mg Oral Q6H PRN Laveda Abbe, NP   650 mg at 02/15/17 0826  . alum & mag hydroxide-simeth (MAALOX/MYLANTA) 200-200-20 MG/5ML suspension 30 mL  30 mL Oral Q4H PRN Laveda Abbe, NP   30 mL at 02/15/17 2128  . gabapentin (NEURONTIN) capsule 300 mg  300 mg Oral TID PC & HS Nwoko, Agnes I, NP   300 mg at 02/16/17 0823  . hydrOXYzine (ATARAX/VISTARIL) tablet 25 mg  25 mg Oral Q6H PRN Laveda Abbe, NP   25 mg at 02/16/17 0825  . magnesium hydroxide (MILK OF MAGNESIA) suspension 30 mL  30 mL Oral Daily PRN Arville Care,  Dorise Hiss, NP   30 mL at 02/14/17 1318  . multivitamin with minerals tablet 1 tablet  1 tablet Oral Daily Laveda Abbe, NP   1 tablet at 02/16/17 9604  . naproxen (NAPROSYN) tablet 500 mg  500 mg Oral BID PRN Oneta Rack, NP   500 mg at 02/16/17 0825  . nicotine (NICODERM CQ - dosed in mg/24 hours) patch 21 mg  21 mg Transdermal Daily Cobos, Rockey Situ, MD   21 mg at 02/16/17 0825  . pneumococcal 23 valent vaccine (PNU-IMMUNE) injection 0.5 mL  0.5 mL Intramuscular Prior to discharge Cobos, Rockey Situ, MD      . sertraline (ZOLOFT) tablet 50 mg  50 mg Oral Daily Cobos, Rockey Situ, MD   50 mg at 02/16/17 0823  . thiamine (VITAMIN B-1) tablet 100 mg  100 mg Oral Daily Laveda Abbe, NP   100 mg at 02/16/17 5409  . traZODone (DESYREL) tablet 150 mg  150 mg Oral QHS Armandina Stammer I, NP   150 mg at 02/15/17 2128   Lab Results:  No results found for this or any previous visit (from the past 48 hour(s)).  Blood Alcohol level:  Lab Results  Component Value Date   ETH 72 (H) 02/11/2017   ETH 236 (H) 10/15/2016    Metabolic Disorder Labs: No results found for: HGBA1C, MPG No results found for: PROLACTIN No results found for: CHOL, TRIG, HDL, CHOLHDL, VLDL, LDLCALC  Physical Findings: AIMS: Facial and Oral Movements Muscles of Facial Expression: None, normal Lips and Perioral Area: None, normal Jaw: None, normal Tongue: None, normal,Extremity Movements Upper (arms, wrists, hands, fingers): None, normal Lower (legs, knees, ankles, toes): None, normal, Trunk Movements Neck, shoulders, hips: None, normal, Overall Severity Severity of abnormal movements (highest score from questions above): None, normal Incapacitation due to abnormal movements: None, normal Patient's awareness of abnormal movements (rate only patient's report): No Awareness, Dental Status Current problems with teeth and/or dentures?: No Does patient usually wear dentures?: No  CIWA:  CIWA-Ar Total: 1 COWS:     Musculoskeletal: Strength & Muscle Tone: within normal limits- mild distal tremors Gait & Station: normal Patient leans: N/A  Psychiatric Specialty Exam: Physical Exam  Nursing note and vitals reviewed. Constitutional: She is oriented to person, place, and time.  Neurological: She is alert and oriented to person, place, and time.    Review of Systems  Cardiovascular: Negative for palpitations.  Skin: Negative for rash.  Psychiatric/Behavioral: Positive for depression. The patient is nervous/anxious and has insomnia.   - no chest pain, no shortness of breath, (+) nausea, no  vomiting   Blood pressure (!) 133/94, pulse 89, temperature 98 F (36.7 C), temperature source Oral, resp. rate 16, height 5\' 1"  (1.549 m), weight 233 lb (105.7 kg), last menstrual period 02/02/2017, SpO2 100 %.Body mass index is 44.02 kg/m.  General Appearance: Fairly Groomed  Eye Contact:  Good  Speech:  Normal Rate  Volume:  Normal  Mood: " improving"   Affect:  approproiate  Thought Process:  Linear and Descriptions of Associations: Intact  Orientation:  Full (Time, Place, and Person)  Thought Content:  no hallucinations, no delusions, not internally preoccupied   Suicidal Thoughts:  No denies suicidal or self injurious ideations , denies homicidal or violent ideations  Homicidal Thoughts:  No  Memory:  recent and remote grossly intact   Judgement:  Fair   Insight:  Fair  Psychomotor Activity:  Normal  Concentration:  Concentration: Good  and Attention Span: Good  Recall:  Good  Fund of Knowledge:  Good  Language:  Good  Akathisia:  Negative  Handed:  Right  AIMS (if indicated):     Assets:  Communication Skills Desire for Improvement Resilience  ADL's:  Intact  Cognition:  WNL  Sleep:  Number of Hours: 5.75   Assessment - remains dysphoric, tolerating detox. Some improvement  Treatment Plan Summary: Daily contact with patient to assess and evaluate symptoms and progress in treatment, Medication management, Plan inpatient admission and medications as below.  Reviewed current treatment plan and will continue the following plan with adjustments as noted;   Patient is interested in going to a rehab setting at discharge treatment team working on disposition planning   Substance use disorder: continue prn ativan detox  STD panel in progress  Continue Neurontin 300 mg po four times per day after meals and at bedtime for agiatation/ anxiety  Mood symptoms: continue Zoloft 50 mg po dailt  Insomnia- Discontinue trazodone and start Seroquel 25 mg po daily at bedtime.     Denzil MagnusonLaShunda Tyasia Packard, NP, PMHNP, FNP-BC 02/16/2017, 11:17 AM   Patient ID: Shirley Murphy, female   DOB: Mar 25, 1986, 31 y.o.   MRN: [id number]

## 2017-02-16 NOTE — Plan of Care (Signed)
Problem: Education: Goal: Verbalization of understanding the information provided will improve Outcome: Progressing Patient is becoming more insightful into her issues and substance abuse. She is receptive to the idea of treatment and recovery.

## 2017-02-17 ENCOUNTER — Encounter (HOSPITAL_COMMUNITY): Payer: Self-pay | Admitting: Behavioral Health

## 2017-02-17 DIAGNOSIS — F17210 Nicotine dependence, cigarettes, uncomplicated: Secondary | ICD-10-CM

## 2017-02-17 DIAGNOSIS — F1490 Cocaine use, unspecified, uncomplicated: Secondary | ICD-10-CM

## 2017-02-17 DIAGNOSIS — G4700 Insomnia, unspecified: Secondary | ICD-10-CM

## 2017-02-17 DIAGNOSIS — F1910 Other psychoactive substance abuse, uncomplicated: Secondary | ICD-10-CM

## 2017-02-17 DIAGNOSIS — F39 Unspecified mood [affective] disorder: Secondary | ICD-10-CM

## 2017-02-17 LAB — GLUCOSE, CAPILLARY: Glucose-Capillary: 90 mg/dL (ref 65–99)

## 2017-02-17 LAB — GC/CHLAMYDIA PROBE AMP (~~LOC~~) NOT AT ARMC
Chlamydia: POSITIVE — AB
Neisseria Gonorrhea: NEGATIVE

## 2017-02-17 LAB — HSV 2 ANTIBODY, IGG: HSV 2 Glycoprotein G Ab, IgG: 23.1 index — ABNORMAL HIGH (ref 0.00–0.90)

## 2017-02-17 MED ORDER — AZITHROMYCIN 500 MG PO TABS
1000.0000 mg | ORAL_TABLET | Freq: Every day | ORAL | Status: AC
  Administered 2017-02-17: 1000 mg via ORAL
  Filled 2017-02-17: qty 4
  Filled 2017-02-17: qty 2

## 2017-02-17 MED ORDER — TRAZODONE HCL 100 MG PO TABS
100.0000 mg | ORAL_TABLET | Freq: Every day | ORAL | Status: DC
  Administered 2017-02-17 – 2017-02-18 (×2): 100 mg via ORAL
  Filled 2017-02-17 (×4): qty 1
  Filled 2017-02-17: qty 14

## 2017-02-17 NOTE — [facility] Group Notes (Signed)
[facility] LCSW Group Therapy  02/17/2017 3:22 PM  Type of Therapy:  Group Therapy  Participation Level:  Active  Participation Quality:  Attentive  Affect:  Appropriate  Cognitive:  Alert and Oriented  Insight:  Improving  Engagement in Therapy:  Improving  Modes of Intervention:  Discussion, Education, Exploration, Socialization and Support  Summary of Progress/Problems: [facility] Speaker came to talk about his personal journey with substance abuse and addiction. The pt processed ways by which to relate to the speaker. [facility] speaker provided handouts and educational information pertaining to groups and [facility].   Ardell Makarewicz N Smart LCSW 02/17/2017, 3:22 PM

## 2017-02-17 NOTE — [facility] Group Notes (Signed)
[facility] Group Notes:  (Nursing/MHT/Case Management/Adjunct)  Date:  02/17/2017  Time:  0902  Type of Therapy:  Nurse Education  Participation Level:  Did Not Attend  Summary of Progress/Problems: Pt was invited but did not attend.  Maurine SimmeringShugart, Allura Doepke M 02/17/2017, 10:03 AM

## 2017-02-17 NOTE — Progress Notes (Signed)
D: Shirley Murphy denied SI, HI, and AVH today. She has been cooperative and pleasant, although with some mood lability. This morning she came back from the gym appearing fussy, anxious, and complaining that she felt her blood sugar was low. She denied any hx of being diagnosed with DM or hypoglycemia but said she'd had such events in the past. When checked, CBG was 90. She was later very tearful after learning the results of her STD testing. She felt better after discussing her feelings for awhile. She is worried about disclosing this information to her boyfriend, who she says will blame her and call her a whore.  On her self-inventory form, she reported good sleep, good appetite, normal energy level, and good concentration. She rated her depression 5/10, hopelessness 3/10, and anxiety 8/10.   A: Meds given as ordered. Q15 safety checks maintained. Support/encouragement offered.  R: Pt remains free from harm and continues with treatment. Will continue to monitor for needs/safety.

## 2017-02-17 NOTE — Progress Notes (Signed)
Recreation Therapy Notes  Animal-Assisted Activity (AAA) Program Checklist/Progress Notes Patient Eligibility Criteria Checklist & Daily Group note for Rec TxIntervention  Date: 07.03.2018 Time: 3:00pm [address]    AAA/T Program Assumption of Risk Form signed by Patient/ or Parent Legal Guardian Yes  Patient is free of allergies or sever asthma Yes  Patient reports no fear of animals Yes  Patient reports no history of cruelty to animals Yes  Patient understands his/her participation is voluntary Yes  Behavioral Response: Did not attend.   Marykay Lexenise L Yadiel Aubry, LRT/CTRS       Lorenz Donley L 02/17/2017 3:13 PM

## 2017-02-17 NOTE — Progress Notes (Signed)
D: Pt at the time of assessment was hyper-verbal and restless; "I have ADHD, it is very hard for me to sit still. Pt endorsed moderate anxiety and moderate bilateral knee pain; "I may be going to a long-term facility on Thursday and I am a little anxious about that. Pt denied SI, HI, depression or AVH.  A: Medications offered as prescribed. All patient's questions and concerns addressed. Support, encouragement, and safe environment provided. 15-minute safety checks continue. R: Pt was med compliant. Pt attended wrap-up group. Safety checks continue.

## 2017-02-17 NOTE — Tx Team (Signed)
Interdisciplinary Treatment and Diagnostic Plan Update 02/17/2017 Time of Session: 9:30am  Shirley Murphy  MRN: [id number]  Principal Diagnosis: Alcohol Use Disorder, Alcohol Induced Mood Disorder, Depressed   Secondary Diagnoses: Active Problems:   MDD (major depressive disorder), recurrent severe, without psychosis (HCC)   Current Medications:  Current Facility-Administered Medications  Medication Dose Route Frequency Provider Last Rate Last Dose  . acetaminophen (TYLENOL) tablet 650 mg  650 mg Oral Q6H PRN Laveda AbbeParks, Laurie Britton, NP   650 mg at 02/16/17 1427  . alum & mag hydroxide-simeth (MAALOX/MYLANTA) 200-200-20 MG/5ML suspension 30 mL  30 mL Oral Q4H PRN Laveda AbbeParks, Laurie Britton, NP   30 mL at 02/15/17 2128  . gabapentin (NEURONTIN) capsule 300 mg  300 mg Oral TID PC & HS Nwoko, Agnes I, NP   300 mg at 02/17/17 1251  . hydrOXYzine (ATARAX/VISTARIL) tablet 25 mg  25 mg Oral Q6H PRN Laveda AbbeParks, Laurie Britton, NP   25 mg at 02/17/17 1048  . magnesium hydroxide (MILK OF MAGNESIA) suspension 30 mL  30 mL Oral Daily PRN Laveda AbbeParks, Laurie Britton, NP   30 mL at 02/14/17 1318  . multivitamin with minerals tablet 1 tablet  1 tablet Oral Daily Laveda AbbeParks, Laurie Britton, NP   1 tablet at 02/17/17 0813  . naproxen (NAPROSYN) tablet 500 mg  500 mg Oral BID PRN Oneta RackLewis, Tanika N, NP   500 mg at 02/17/17 1048  . nicotine (NICODERM CQ - dosed in mg/24 hours) patch 21 mg  21 mg Transdermal Daily Cobos, Rockey SituFernando A, MD   21 mg at 02/17/17 0813  . pneumococcal 23 valent vaccine (PNU-IMMUNE) injection 0.5 mL  0.5 mL Intramuscular Prior to discharge Cobos, Rockey SituFernando A, MD      . QUEtiapine (SEROQUEL) tablet 100 mg  100 mg Oral QHS,MR X 1 Donell SievertSimon, Spencer E, PA-C   100 mg at 02/16/17 2234  . sertraline (ZOLOFT) tablet 50 mg  50 mg Oral Daily Cobos, Rockey SituFernando A, MD   50 mg at 02/17/17 0813  . thiamine (VITAMIN B-1) tablet 100 mg  100 mg Oral Daily Laveda AbbeParks, Laurie Britton, NP   100 mg at 02/17/17 [id number]    PTA  Medications: Prescriptions Prior to Admission  Medication Sig Dispense Refill Last Dose  . hydrOXYzine (ATARAX/VISTARIL) 25 MG tablet Take 1 tablet (25 mg total) by mouth every 6 (six) hours as needed for anxiety. (Patient not taking: Reported on 02/11/2017) 30 tablet 0 Not Taking at Unknown time  . nicotine (NICODERM CQ - DOSED IN MG/24 HOURS) 21 mg/24hr patch Place 1 patch (21 mg total) onto the skin daily. (Patient not taking: Reported on 02/11/2017) 28 patch 0 Not Taking at Unknown time  . pantoprazole (PROTONIX) 40 MG tablet Take 1 tablet (40 mg total) by mouth daily. (Patient not taking: Reported on 02/11/2017) 30 tablet 0 Not Taking at Unknown time  . QUEtiapine (SEROQUEL) 300 MG tablet Take 1 tablet (300 mg total) by mouth at bedtime. (Patient not taking: Reported on 02/11/2017) 30 tablet 0 Not Taking at Unknown time  . sertraline (ZOLOFT) 50 MG tablet Take 1 tablet (50 mg total) by mouth daily. (Patient not taking: Reported on 02/11/2017) 30 tablet 0 Not Taking at Unknown time    Treatment Modalities: Medication Management, Group therapy, Case management,  1 to 1 session with clinician, Psychoeducation, Recreational therapy.  Patient Stressors: Financial difficulties Loss of children Medication change or noncompliance Substance abuse Patient Strengths: Ability for insight Capable of independent living  Physician Treatment Plan for Primary Diagnosis:  Alcohol Use Disorder, Alcohol Induced Mood Disorder, Depressed  Long Term Goal(s): Improvement in symptoms so as ready for discharge Short Term Goals: Ability to verbalize feelings will improve Ability to disclose and discuss suicidal ideas Ability to demonstrate self-control will improve Ability to identify and develop effective coping behaviors will improve Ability to maintain clinical measurements within normal limits will improve Ability to identify triggers associated with substance abuse/mental health issues will improve  Medication  Management: Evaluate patient's response, side effects, and tolerance of medication regimen.  Therapeutic Interventions: 1 to 1 sessions, Unit Group sessions and Medication administration.  Evaluation of Outcomes: Progressing   Physician Treatment Plan for Secondary Diagnosis: Active Problems:   MDD (major depressive disorder), recurrent severe, without psychosis (HCC)  Long Term Goal(s): Improvement in symptoms so as ready for discharge  Short Term Goals: Ability to verbalize feelings will improve Ability to disclose and discuss suicidal ideas Ability to demonstrate self-control will improve Ability to identify and develop effective coping behaviors will improve Ability to maintain clinical measurements within normal limits will improve Ability to identify triggers associated with substance abuse/mental health issues will improve  Medication Management: Evaluate patient's response, side effects, and tolerance of medication regimen.  Therapeutic Interventions: 1 to 1 sessions, Unit Group sessions and Medication administration.  Evaluation of Outcomes: Progressing  RN Treatment Plan for Primary Diagnosis: Alcohol Use Disorder, Alcohol Induced Mood Disorder, Depressed  Long Term Goal(s): Knowledge of disease and therapeutic regimen to maintain health will improve  Short Term Goals: Compliance with prescribed medications will improve  Medication Management: RN will administer medications as ordered by provider, will assess and evaluate patient's response and provide education to patient for prescribed medication. RN will report any adverse and/or side effects to prescribing provider.  Therapeutic Interventions: 1 on 1 counseling sessions, Psychoeducation, Medication administration, Evaluate responses to treatment, Monitor vital signs and CBGs as ordered, Perform/monitor CIWA, COWS, AIMS and Fall Risk screenings as ordered, Perform wound care treatments as ordered.  Evaluation of  Outcomes: Progressing   LCSW Treatment Plan for Primary Diagnosis: Alcohol Use Disorder, Alcohol Induced Mood Disorder, Depressed  Long Term Goal(s): Safe transition to appropriate next level of care at discharge, Engage patient in therapeutic group addressing interpersonal concerns. Short Term Goals: Engage patient in aftercare planning with referrals and resources, Facilitate patient progression through stages of change regarding substance use diagnoses and concerns, Identify triggers associated with mental health/substance abuse issues and Increase skills for wellness and recovery  Therapeutic Interventions: Assess for all discharge needs, 1 to 1 time with Social worker, Explore available resources and support systems, Assess for adequacy in community support network, Educate family and significant other(s) on suicide prevention, Complete Psychosocial Assessment, Interpersonal group therapy.  Evaluation of Outcomes: Progressing   Progress in Treatment: Attending groups: Yes Participating in groups: Yes Taking medication as prescribed: Yes, MD continues to assess for medication changes as needed Toleration medication: Yes, no side effects reported at this time Family/Significant other contact made: No, CSW will make contact if pt consents. Patient understands diagnosis: Continuing to assess Discussing patient identified problems/goals with staff: Yes Medical problems stabilized or resolved: Yes Denies suicidal/homicidal ideation: Yes, per self report.  Issues/concerns per patient self-inventory: None Other: N/A  New problem(s) identified: None identified at this time.   New Short Term/Long Term Goal(s): None identified at this time.   Discharge Plan or Barriers: Pt has [facility] screening for possible admission on Thursday at 745am.   Reason for Continuation of Hospitalization:   Depression  Medication stabilization  Estimated Length of Stay: Thursday at 7am via taxi (02/19/17).    Attendees: Patient: 02/17/2017 1:00 PM  Physician: Dr. Lucianne Muss MD 02/17/2017 1:00 PM  Nursing: Mayme Genta RN 02/17/2017 1:00 PM  RN Care Manager: Onnie Boer CM 02/17/2017 1:00 PM  Social Worker: Chartered loss adjuster, LCSW 02/17/2017 1:00 PM  Recreational Therapist: x 02/17/2017 1:00 PM  Other: Armandina Stammer, NP; Claudette Head NP; Denzil Magnuson NP 02/17/2017 1:00 PM  Other:  02/17/2017 1:00 PM  Other: 02/17/2017 1:00 PM   Scribe for Treatment Team: Trula Slade, MSW, LCSW Clinical Social Worker 02/17/2017 1:02 PM

## 2017-02-17 NOTE — [facility] Suicide Risk Assessment (Signed)
[facility] INPATIENT:  Family/Significant Other Suicide Prevention Education  Suicide Prevention Education:  Patient Refusal for Family/Significant Other Suicide Prevention Education: The patient Shirley Murphy has refused to provide written consent for family/significant other to be provided Family/Significant Other Suicide Prevention Education during admission and/or prior to discharge.  Physician notified.  SPE completed with pt, as pt refused to consent to family contact. SPI pamphlet provided to pt and pt was encouraged to share information with support network, ask questions, and talk about any concerns relating to SPE. Pt denies access to guns/firearms and verbalized understanding of information provided. Mobile Crisis information also provided to pt.   Gara Kincade N Smart LCSW 02/17/2017, 1:19 PM

## 2017-02-17 NOTE — Progress Notes (Signed)
[facility] MD Progress Note  02/17/2017 11:24 AM [name]  MRN:  [id number]  Subjective:  [place] reports, "I am very anxious this morning. I took the Seroquel last night and it made me feel angry when I woke up. I want to go back on the trazodone. "   Objective: Face to face evaluation completed and chart reviewed. During this evaluation, patient is alert and oriented x4, calm,and cooperative. Patient endorses a significant level of anxiety at this time although she endorses improvement in depressive symptoms. She continues to endorse poor sleeping pattern despite switching from Trazodone to Seroquel. Endorses that after taking the Seroquel, she woke up angry. She endorses fair sleeping pattern. Denies suicidal ideation, homicidal ideas, or AVH. She does not appear to be preoccupied with internal stimuli.Continues to endorse her desire to be discharge to [facility]. Presented to [facility] with a history of cocaine and alcohol [place] and denies withdrawal symptoms at this time. Continues to take medications as prescribed and besides concerns with Seroquel, reports medications are well tolerated and without side effects.. At this time, she is able to contract for safety on the unit.    Principal Problem:  Alcohol Dependence  Diagnosis:   Patient Active Problem List   Diagnosis Date Noted  . MDD (major depressive disorder), recurrent severe, without psychosis (HCC) [F33.2] 10/17/2016  . Suicide attempt (HCC) [T14.91XA] 10/15/2016  . HTN (hypertension), benign [I10] 10/15/2016  . Irregular heart beat [I49.9] 10/15/2016  . Suicidal ideation [R45.851] 10/15/2016  . Alcoholic intoxication without complication (HCC) [F10.920]    Total Time spent with patient: 20 minutes  Past Medical History:  Past Medical History:  Diagnosis Date  . Chest pain   . Hepatitis C   . Hypertension   . Irregular heart beat     Past Surgical History:  Procedure Laterality Date  . c sections    . TUBAL LIGATION     Family  History: History reviewed. No pertinent family history.  Social History:  History  Alcohol Use No     History  Drug Use  . Types: Cocaine    Social History   Social History  . Marital status: Legally Separated    Spouse name: N/A  . Number of children: N/A  . Years of education: N/A   Social History Main Topics  . Smoking status: Current Every Day Smoker    Packs/day: 1.00    Types: Cigarettes  . Smokeless tobacco: Never Used  . Alcohol use No  . Drug use: Yes    Types: Cocaine  . Sexual activity: Yes    Birth control/ protection: None   Other Topics Concern  . None   Social History Narrative  . None   Additional Social History:   Sleep: Poor  Appetite:  Fair  Current Medications: Current Facility-Administered Medications  Medication Dose Route Frequency Provider Last Rate Last Dose  . acetaminophen (TYLENOL) tablet 650 mg  650 mg Oral Q6H PRN Laveda Abbe, NP   650 mg at 02/16/17 1427  . alum & mag hydroxide-simeth (MAALOX/MYLANTA) 200-200-20 MG/5ML suspension 30 mL  30 mL Oral Q4H PRN Laveda Abbe, NP   30 mL at 02/15/17 2128  . gabapentin (NEURONTIN) capsule 300 mg  300 mg Oral TID PC & HS Nwoko, Agnes I, NP   300 mg at 02/17/17 0813  . hydrOXYzine (ATARAX/VISTARIL) tablet 25 mg  25 mg Oral Q6H PRN Laveda Abbe, NP   25 mg at 02/17/17 1048  . magnesium hydroxide (  MILK OF MAGNESIA) suspension 30 mL  30 mL Oral Daily PRN Laveda Abbe, NP   30 mL at 02/14/17 1318  . multivitamin with minerals tablet 1 tablet  1 tablet Oral Daily Laveda Abbe, NP   1 tablet at 02/17/17 0813  . naproxen (NAPROSYN) tablet 500 mg  500 mg Oral BID PRN Oneta Rack, NP   500 mg at 02/17/17 1048  . nicotine (NICODERM CQ - dosed in mg/24 hours) patch 21 mg  21 mg Transdermal Daily Cobos, Rockey Situ, MD   21 mg at 02/17/17 0813  . pneumococcal 23 valent vaccine (PNU-IMMUNE) injection 0.5 mL  0.5 mL Intramuscular Prior to discharge Cobos,  Rockey Situ, MD      . QUEtiapine (SEROQUEL) tablet 100 mg  100 mg Oral QHS,MR X 1 Donell Sievert E, PA-C   100 mg at 02/16/17 2234  . sertraline (ZOLOFT) tablet 50 mg  50 mg Oral Daily Cobos, Rockey Situ, MD   50 mg at 02/17/17 0813  . thiamine (VITAMIN B-1) tablet 100 mg  100 mg Oral Daily Laveda Abbe, NP   100 mg at 02/17/17 4098   Lab Results:  Results for orders placed or performed during the hospital encounter of 02/11/17 (from the past 48 hour(s))  HIV antibody     Status: None   Collection Time: 02/16/17  6:15 AM  Result Value Ref Range   HIV Screen 4th Generation wRfx Non Reactive Non Reactive    Comment: (NOTE) Performed At: [facility] [address] [place], [place] [id number] Mila Homer MD FA:[id number] Performed at [facility], [address], [place], [place] [address]   HSV 2 antibody, IgG     Status: Abnormal   Collection Time: 02/16/17  6:15 AM  Result Value Ref Range   HSV 2 Glycoprotein G Ab, IgG 23.10 (H) 0.00 - 0.90 index    Comment: (NOTE)                                 Negative        <0.91                                 Equivocal 0.91 - 1.09                                 Positive        >1.09 Note: Negative indicates no antibodies detected to HSV-2. Equivocal may suggest early infection.  If clinically appropriate, retest at later date. 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 **Effective March 23, 2017 HSV 2 IgG, Type Spec will**   be made non-orderable. [facility] offers   HSV-2 Type Spec Ab, IgG w/Rflx. This will affect   any existing profile. For further information,   please contact your Engineer, maintenance. Performed At: [facility] [address] [place], [place] [id number] Mila Homer MD WN:[id number] Performed at [facility], [address] [place], [place] [address]   RPR     Status: None   Collection Time: 02/16/17  6:15 AM  Result Value Ref  Range   RPR Ser Ql Non Reactive Non Reactive    Comment: (NOTE) Performed At: [facility] [address] [place], [place] [id number] Mila Homer  MD ZO:[phone] Performed at [facility], [address] [place], [place] [id number]     Blood Alcohol level:  Lab Results  Component Value Date   ETH 72 (H) 02/11/2017   ETH 236 (H) 10/15/2016    Metabolic Disorder Labs: No results found for: HGBA1C, MPG No results found for: PROLACTIN No results found for: CHOL, TRIG, HDL, CHOLHDL, VLDL, LDLCALC  Physical Findings: AIMS: Facial and Oral Movements Muscles of Facial Expression: None, normal Lips and Perioral Area: None, normal Jaw: None, normal Tongue: None, normal,Extremity Movements Upper (arms, wrists, hands, fingers): None, normal Lower (legs, knees, ankles, toes): None, normal, Trunk Movements Neck, shoulders, hips: None, normal, Overall Severity Severity of abnormal movements (highest score from questions above): None, normal Incapacitation due to abnormal movements: None, normal Patient's awareness of abnormal movements (rate only patient's report): No Awareness, Dental Status Current problems with teeth and/or dentures?: No Does patient usually wear dentures?: No  CIWA:  CIWA-Ar Total: 3 COWS:     Musculoskeletal: Strength & Muscle Tone: within normal limits- mild distal tremors Gait & Station: normal Patient leans: N/A  Psychiatric Specialty Exam: Physical Exam  Nursing note and vitals reviewed. Constitutional: She is oriented to person, place, and time.  Neurological: She is alert and oriented to person, place, and time.    Review of Systems  Cardiovascular: Negative for palpitations.  Skin: Negative for rash.  Psychiatric/Behavioral: Positive for depression and substance abuse. The patient is nervous/anxious and has insomnia.   - no chest pain, no shortness of breath, (+) nausea, no vomiting   Blood pressure 112/66, pulse  71, temperature 97.8 F (36.6 C), temperature source Oral, resp. rate 18, height 5\' 1"  (1.549 m), weight 233 lb (105.7 kg), last menstrual period 02/02/2017, SpO2 100 %.Body mass index is 44.02 kg/m.  General Appearance: Fairly Groomed  Eye Contact:  Good  Speech:  Normal Rate  Volume:  Normal  Mood: " anxious"   Affect:  Congruent, anxious   Thought Process:  Linear and Descriptions of Associations: Intact  Orientation:  Full (Time, Place, and Person)  Thought Content:  no hallucinations, no delusions, not internally preoccupied   Suicidal Thoughts:  No denies suicidal or self injurious ideations , denies homicidal or violent ideations  Homicidal Thoughts:  No  Memory:  recent and remote grossly intact   Judgement:  Fair   Insight:  Fair  Psychomotor Activity:  Normal  Concentration:  Concentration: Good and Attention Span: Good  Recall:  Good  Fund of Knowledge:  Good  Language:  Good  Akathisia:  Negative  Handed:  Right  AIMS (if indicated):     Assets:  Communication Skills Desire for Improvement Resilience  ADL's:  Intact  Cognition:  WNL  Sleep:  Number of Hours: 6   Assessment - Remains anxious. Endorses some improvement in depressive symptoms.  Tolerating detox. Reviewed current treatment plan and will continue the following plan with adjustments as noted;   Treatment Plan Summary: Daily contact with patient to assess and evaluate symptoms and progress in treatment, Medication management, Plan inpatient admission and medications as below.  Patient is interested in going to a rehab setting at discharge treatment team working on disposition planning   Substance use disorder: continue prn ativan detox  STD panel: RPR negative. HIV antibody negative. HSV antibody IgG positive. Chlamydia positive. 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 She denies outbreaks at this time. Ordered Azythromycin 1000 mg x1 for  Chlamydia infection.    Continue Neurontin 300 mg po four times per day after meals and at bedtime for agiatation/ anxiety  Mood symptoms: continue Zoloft 50 mg po dailt  Insomnia- Restart trazodone yet restart at 100 mg po daily at bedtime for insomnia (dose decreased at patients request). Discontinue Seroquel 25 mg po daily at bedtime due to reported side effects.    Denzil Magnuson, NP, PMHNP, FNP-BC 02/17/2017, 11:24 AM   Patient ID: Shirley Murphy, female   DOB: September 08, 1985, 31 y.o.   MRN: [id number]

## 2017-02-17 NOTE — Plan of Care (Signed)
Problem: Safety: Goal: Ability to disclose and discuss suicidal ideas will improve Outcome: Progressing Pt denies SI and contracts for safety on unit.

## 2017-02-18 ENCOUNTER — Encounter (HOSPITAL_COMMUNITY): Payer: Self-pay | Admitting: Behavioral Health

## 2017-02-18 MED ORDER — GABAPENTIN 300 MG PO CAPS: 300.0000 mg | ORAL_CAPSULE | Freq: Three times a day (TID) | ORAL | 0 refills | Status: DC

## 2017-02-18 MED ORDER — SERTRALINE HCL 50 MG PO TABS: 50.0000 mg | ORAL_TABLET | Freq: Every day | ORAL | 0 refills | Status: DC

## 2017-02-18 MED ORDER — NICOTINE 21 MG/24HR TD PT24: 21.0000 mg | MEDICATED_PATCH | Freq: Every day | TRANSDERMAL | 0 refills | Status: DC

## 2017-02-18 MED ORDER — TRAZODONE HCL 100 MG PO TABS: 100.0000 mg | ORAL_TABLET | Freq: Every day | ORAL | 0 refills | Status: DC

## 2017-02-18 MED ORDER — HYDROXYZINE HCL 25 MG PO TABS
25.0000 mg | ORAL_TABLET | Freq: Four times a day (QID) | ORAL | 0 refills | Status: DC | PRN
Start: 2017-02-18 — End: 2017-03-05

## 2017-02-18 NOTE — [facility] Group Notes (Signed)
[facility]   02/18/2017 10:49 AM  Participation Quality:  Appropriate   Mood/Affect:  Appropriate  Thoughts of Suicide:  No Will you contract for safety?   NA  Current AVH:  No  Plan for Discharge/Comments:  Pt has screening for [facility] for Thursday at 7:45AM. She is now asking to discharge home today and her sister can take her in the morning to her screening. Per Dr. Jackquline BerlinIzediuno, he would prefer to keep her until morning in order to avoid potential for relapse and missed appt.   Transportation Means: taxi voucher in her chart.   Supports: sister/boyfriend   [employer]ulte HomesHeather N Smart

## 2017-02-18 NOTE — BHH Suicide Risk Assessment (Signed)
[facility] Discharge Suicide Risk Assessment   Principal Problem: MDD (major depressive disorder), recurrent severe, without psychosis ([name]) Discharge Diagnoses:  Patient Active Problem List   Diagnosis Date Noted  . MDD (major depressive disorder), recurrent severe, without psychosis ([place]irley Murphy) [F33.2] 10/17/2016  . Suicide attempt ([name]) [T14.91XA] 10/15/2016  . HTN (hypertension), benign [I10] 10/15/2016  . Irregular heart beat [I49.9] 10/15/2016  . Suicidal ideation [R45.851] 10/15/2016  . Alcoholic intoxication without complication ([name]) [A12.878]     Total Time spent with patient: 45 minutes  Musculoskeletal: Strength & Muscle Tone: within normal limits Gait & Station: normal Patient leans: N/A  Psychiatric Specialty Exam: Review of Systems  Constitutional: Negative.   HENT: Negative.   Eyes: Negative.   Respiratory: Negative.   Cardiovascular: Negative.   Gastrointestinal: Negative.   Genitourinary: Negative.   Musculoskeletal: Negative.   Skin: Negative.   Neurological: Negative.   Endo/Heme/Allergies: Negative.   Psychiatric/Behavioral: Negative for depression, hallucinations, memory loss and suicidal ideas. The patient is not nervous/anxious and does not have insomnia.     Blood pressure 114/63, pulse 88, temperature 97.8 F (36.6 C), temperature source Oral, resp. rate 16, height '5\' 1"'  (1.549 m), weight 105.7 kg (233 lb), last menstrual period 02/02/2017, SpO2 100 %.Body mass index is 44.02 kg/m.  General Appearance: Neatly dressed, pleasant, engaging well and cooperative. Appropriate behavior. Not in any distress. Good relatedness. Not internally stimulated  Eye Contact::  Good  Speech:  Spontaneous, normal prosody. Normal tone and rate.   Volume:  Normal  Mood:  Euthymic  Affect:  Appropriate and Full Range  Thought Process:  Goal Directed and Linear  Orientation:  Full (Time, Place, and Person)  Thought Content:  No delusional theme. No preoccupation with violent  thoughts. No negative ruminations. No obsession.  No hallucination in any modality.   Suicidal Thoughts:  No  Homicidal Thoughts:  No  Memory:  Immediate;   Good Recent;   Good Remote;   Good  Judgement:  Good  Insight:  Good  Psychomotor Activity:  Normal  Concentration:  Good  Recall:  Good  Fund of Knowledge:Good  Language: Good  Akathisia:  Negative  Handed:    AIMS (if indicated):     Assets:  Communication Skills Desire for Improvement Housing Intimacy Physical Health  Sleep:  Number of Hours: 6  Cognition: WNL  ADL's:  Intact   Clinical  Assessment::   Patient is a 31 year old female, who presented to to the ED , brought in by [employer], due to suicidal behaviors. She states she had attempted suicide by jumping  in front of three different vehicles . States someone called police and she was brought to ED. She states that she has a history of alcohol dependence, and has been drinking daily and heavily. Admission BAL was 72.  She states she has recently been using cocaine more regularly as before.  Chart reviewed today. Patient discussed at team  I met with her for the first time today. Tells me that she was drunk and acted impulsively. Says now that she is off substances she is no longer feeling that way. Says she plans to get into rehab tomorrow. Reports that she is in good spirits. Not feeling depressed. Reports normal energy and interest. Has been maintaining normal biological functions. She is able to think clearly. She is able to focus on task. Her thoughts are not racing. No evidence of mania. No hallucination in any modality. He is not making any delusional statement. No  passivity of will/thought. She is fully in touch with reality. No thoughts of suicide. No thoughts of homicide. No violent thoughts. No overwhelming anxiety. Wanted to spend the holiday at home with her family. I explained that chances of not relapsing with substances would be high. Patient was upset that she  could not celebrate with her family and ended the interview.   Nursing staff reports that patient has been appropriate on the unit. Patient has been interacting well with peers. No behavioral issues. Patient has not voiced any suicidal thoughts. Patient has not been observed to be internally stimulated. Patient has been adherent with treatment recommendations. Patient has been tolerating their medication well.   Patient was discussed at team. Team members feels that patient is back to her baseline level of function. Team agrees with plan to discharge patient today.   Demographic Factors:  NA  Loss Factors: NA  Historical Factors: Impulsivity  Risk Reduction Factors:   Sense of responsibility to family, Living with another person, especially a relative, Positive social support, Positive therapeutic relationship and Positive coping skills or problem solving skills  Continued Clinical Symptoms:  As above   Cognitive Features That Contribute To Risk:  None    Suicide Risk:  Minimal: No identifiable suicidal ideation.  Patient is not having any thoughts of suicide at this time. Modifiable risk factors targeted during this admission includes depression and substance use. Demographical and historical risk factors cannot be modified. Patient is now engaging well. Patient is reliable and is future oriented. We have buffered patient's support structures. At this point, patient is at low risk of suicide. Patient is aware of the effects of psychoactive substances on decision making process. Patient has been provided with emergency contacts. Patient acknowledges to use resources provided if unforseen circumstances changes their current risk stratification.   Follow-up Information    Services[facility] Follow up on 02/19/2017.   Why:  Screening for possible admission on Thursday at 7:45AM. Please bring ID with proof of [name] residency, 14 day supply of medications and prescriptions  provided by the hospital. Thank you.  Contact information: [name] [address] [phone]        [facility] Follow up.   Specialty:  Behavioral Health Why:  Walk in within 7 days of hospital discharge/treatment discharge to be seen for hospital follow-up and assessment for mental health services. Walk in hours: Mon-Fri 8am-9am. Thank you.  Contact information: [place] [address] [phone]           Plan Of Care/Follow-up recommendations:  1. Continue current psychotropic medications 2. Mental health and addiction follow up as arranged.  3. Provided limited quantity of prescriptions   Artist [place], MD 02/18/2017, 3:46 PM

## 2017-02-18 NOTE — Progress Notes (Signed)
Recreation Therapy Notes  Date: 02/18/17 Time: 0930 Location: 300 Hall Group Room  Group Topic: Stress Management  Goal Area(s) Addresses:  Patient will verbalize importance of using healthy stress management.  Patient will identify positive emotions associated with healthy stress management.   Intervention: Stress Management  Activity :  Meditation.  LRT introduced the stress management technique of meditation.  LRT played meditation from the Calm app which focused on gratitude.  Patients were to follow along as the meditation played in order to fully engage in the activity.  Education:  Stress Management, Discharge Planning.   Education Outcome: Acknowledges edcuation/In group clarification offered/Needs additional education  Clinical Observations/Feedback:  Pt did not attend group.   Posey Jasmin, LRT/CTRS         Donovan Persley A 02/18/2017 11:54 AM 

## 2017-02-18 NOTE — Discharge Summary (Signed)
Physician Discharge Summary Note  Patient:  Shirley Murphy is an 31 y.o., female MRN:  [id number] DOB:  08/06/1986 Patient phone:  [phone] (home)  Patient address:   [address] [place] [address],  Total Time spent with patient: 30 minutes  Date of Admission:  02/11/2017 Date of Discharge: 02/19/2017  Reason for Admission:  Suicidal behaviors.   Principal Problem: MDD (major depressive disorder), recurrent severe, without psychosis [facility]) Discharge Diagnoses: Patient Active Problem List   Diagnosis Date Noted  . MDD (major depressive disorder), recurrent severe, without psychosis (HCC) [F33.2] 10/17/2016  . Suicide attempt (HCC) [T14.91XA] 10/15/2016  . HTN (hypertension), benign [I10] 10/15/2016  . Irregular heart beat [I49.9] 10/15/2016  . Suicidal ideation [R45.851] 10/15/2016  . Alcoholic intoxication without complication (HCC) [F10.920]     Past Psychiatric History: reports several prior psychiatric admissions, most recently was here at [facility] in February/2018. At the time was admitted for depression, suicide attempt by overdose, and alcohol dependence. Reports history of several prior suicidal attempts starting in adolescence, denies history of self cutting, denies history of psychosis, endorses history of PTSD , which has gradually improved overtime.Reports history of prior Bipolar Disorder diagnosis and describes brief mood swings in the past, but states she feels her psychiatric symptoms are at least partially related to alcohol/substance abuse . Denies history of violence . When last discharged in February was discharged on Zoloft and Seroquel . Past Medical History:  Past Medical History:  Diagnosis Date  . Chest pain   . Hepatitis C   . Hypertension   . Irregular heart beat     Past Surgical History:  Procedure Laterality Date  . c sections    . TUBAL LIGATION     Family History: History reviewed. No pertinent family history. Family  Psychiatric  History: father and mother have history of alcohol and drug abuse, states she thinks father had bipolar disorder, father attempted suicide  Social History:  History  Alcohol Use No     History  Drug Use  . Types: Cocaine    Social History   Social History  . Marital status: Legally Separated    Spouse name: N/A  . Number of children: N/A  . Years of education: N/A   Social History Main Topics  . Smoking status: Current Every Day Smoker    Packs/day: 1.00    Types: Cigarettes  . Smokeless tobacco: Never Used  . Alcohol use No  . Drug use: Yes    Types: Cocaine  . Sexual activity: Yes    Birth control/ protection: None   Other Topics Concern  . None   Social History Narrative  . None    Hospital Course:  Patient is a 31 year old female, who presented to to the ED , brought in by [employer], due to suicidal behaviors. She states she had attempted suicide by jumping  in front of three different vehicles . States someone called police and she was brought to ED. She states that she has a history of alcohol dependence, and has been drinking daily and heavily. Admission BAL was 72.  She states she has recently been using cocaine more regularly as before. She reports her mood has been " bad" and states recently she has been struggling with depression,  feelings of hopelessness and low self worth, as well as suicidal ideations. She states " it's like I don't care anymore, I am tired of hurting".  Reports that a recent stressor is finding  out that her ex boyfriend who is incarcerated for domestic violence is being released soon.  She states she has not been taking any psychiatric medications for several weeks, months.   After the above admission assessment and during this hospital course, patients presenting symptoms were identified. Labs were reviewed and her UDS was negative although she reported a history of Alcohol dependence, and Cocaine Abuse. Detoxication protocol  administered as appropriate.  Patient was treated and discharged with the following medications; Neurontin 300 mg po four times per day after meals and at bedtime for agiatation/ anxiety, Zoloft 50 mg po daily for depression, trazodone 100 mg po daily at bedtime for insomnia, Nicotine Patch for smoking cessation, Vistaril 25 mg po every 6 hours as needed for anxiety. She remained compliant with therapeutic milieu and actively participated in group counseling sessions. AA/NA meetings were offered & held on the unit with patient active participation. While on the unit, patient was able to verbalize learned coping skills for better management of depression and suicidal thoughts upon returning home.   During the course of her hospitalization, improvement was monitored by observation and Kamrin's  daily  report of symptom reduction, presentation of good affect,and overall improvement in mood & behavior. Besides treatment for mood stabilization, patient received medication regimen for   Chlamydia. Her HS2 results were positive and she was advised to follow-up with her PCP for further evaluation and medication as appropriated. She denied current outbreak. She was advised to notify partner.   Patient tolerated her treatment regimen without any adverse effects reported.  Minnes case was presented during treatment team meeting this morning. The team members were all in agreement that Minne was both mentally & medically stable to be discharged to continue mental health care on an outpatient basis as noted below. She was provided with all the necessary information needed to make this appointment without problems.  Upon discharge, Jannel denied any SI/HI, AVH, delusional thoughts, or paranoia. She denied  any substance withdrawal symptoms. She was provided with prescriptions  of her [facility] discharge medications to be taken to her pharmacy as well as 14 days worth of samples. She left [facility] with all personal belongings in no  apparent distress. Transportation per patients arrangement.  Physical Findings: AIMS: Facial and Oral Movements Muscles of Facial Expression: None, normal Lips and Perioral Area: None, normal Jaw: None, normal Tongue: None, normal,Extremity Movements Upper (arms, wrists, hands, fingers): None, normal Lower (legs, knees, ankles, toes): None, normal, Trunk Movements Neck, shoulders, hips: None, normal, Overall Severity Severity of abnormal movements (highest score from questions above): None, normal Incapacitation due to abnormal movements: None, normal Patient's awareness of abnormal movements (rate only patient's report): No Awareness, Dental Status Current problems with teeth and/or dentures?: No Does patient usually wear dentures?: No  CIWA:  CIWA-Ar Total: 3 COWS:     Musculoskeletal: Strength & Muscle Tone: within normal limits Gait & Station: normal Patient leans: N/A  Psychiatric Specialty Exam: SEE SRA BY MD Physical Exam  Nursing note and vitals reviewed. Constitutional: She is oriented to person, place, and time.  Neurological: She is alert and oriented to person, place, and time.    Review of Systems  Psychiatric/Behavioral: Positive for depression (improved) and substance abuse (Hx of substnace abuse). Negative for hallucinations, memory loss and suicidal ideas. The patient is nervous/anxious (improved) and has insomnia (improved).   All other systems reviewed and are negative.   Blood pressure 127/85, pulse 78, temperature 97.8 F (36.6 C), temperature source Oral,  resp. rate 16, height 5\' 1"  (1.549 m), weight 233 lb (105.7 kg), last menstrual period 02/02/2017, SpO2 100 %.Body mass index is 44.02 kg/m.    Have you used any form of tobacco in the last 30 days? (Cigarettes, Smokeless Tobacco, Cigars, and/or Pipes): Yes  Has this patient used any form of tobacco in the last 30 days? (Cigarettes, Smokeless Tobacco, Cigars, and/or Pipes) Yes, Prescription provided for  Nicoderm patch upon discharge.   Blood Alcohol level:  Lab Results  Component Value Date   ETH 72 (H) 02/11/2017   ETH 236 (H) 10/15/2016    Metabolic Disorder Labs:  No results found for: HGBA1C, MPG No results found for: PROLACTIN No results found for: CHOL, TRIG, HDL, CHOLHDL, VLDL, LDLCALC  See Psychiatric Specialty Exam and Suicide Risk Assessment completed by Attending Physician prior to discharge.  Discharge destination:  [facility]  Is patient on multiple antipsychotic therapies at discharge:  No   Has Patient had three or more failed trials of antipsychotic monotherapy by history:  No  Recommended Plan for Multiple Antipsychotic Therapies: NA   Allergies as of 02/18/2017      Reactions   Other    benzoyl peroxide      Medication List    STOP taking these medications   pantoprazole 40 MG tablet Commonly known as:  PROTONIX   QUEtiapine 300 MG tablet Commonly known as:  SEROQUEL     TAKE these medications     Indication  gabapentin 300 MG capsule Commonly known as:  NEURONTIN Take 1 capsule (300 mg total) by mouth 4 (four) times daily - after meals and at bedtime.  Indication:  Agitation, Alcohol Withdrawal Syndrome   hydrOXYzine 25 MG tablet Commonly known as:  ATARAX/VISTARIL Take 1 tablet (25 mg total) by mouth every 6 (six) hours as needed for anxiety. For anxiety What changed:  additional instructions  Indication:  Anxiety Neurosis   nicotine 21 mg/24hr patch Commonly known as:  NICODERM CQ - dosed in mg/24 hours Place 1 patch (21 mg total) onto the skin daily. Start taking on:  02/19/2017  Indication:  Nicotine Addiction   sertraline 50 MG tablet Commonly known as:  ZOLOFT Take 1 tablet (50 mg total) by mouth daily. For depression What changed:  additional instructions  Indication:  Major Depressive Disorder   traZODone 100 MG tablet Commonly known as:  DESYREL Take 1 tablet (100 mg total) by mouth at bedtime.  Indication:   Trouble Sleeping      Follow-up Information    [facility] Follow up on 02/19/2017.   Why:  Screening for possible admission on Thursday at 7:45AM. Please [place], 14 day supply of medications and prescriptions provided by the hospital. Thank you.  Contact information: [address] [place] [address] [phone]        [facility] Follow up.   Specialty:  Behavioral Health Why:  Walk in within 7 days of hospital discharge/treatment discharge to be seen for hospital follow-up and assessment for mental health services. Walk in hours: Mon-Fri 8am-9am. Thank you.  Contact informationElpidio Eric: [address] [place] [address] [phone]           Follow-up recommendations:  Follow up with your outpatient provided for any medical issues. Activity & diet as recommended by your primary care provider.  Comments:  Patient is instructed prior to discharge to: Take all medications as prescribed by his/her mental healthcare provider. Report any  adverse effects and or reactions from the medicines to his/her outpatient provider promptly. Patient has been instructed & cautioned: To not engage in alcohol and or illegal drug use while on prescription medicines. In the event of worsening symptoms, patient is instructed to call the crisis hotline, 911 and or go to the nearest ED for appropriate evaluation and treatment of symptoms. To follow-up with his/her primary care provider for your other medical issues, concerns and or health care needs.  Signed: Denzil Magnuson, NP 02/18/2017, 9:27 AM

## 2017-02-18 NOTE — Progress Notes (Signed)
Data. Patient denies SI/HI/AVH. Patient interacting well with staff and other patients. Patient has, however, been perseverating on leaving today, even though she is scheduled to leave early tomorrow morning to go to a treatment center. When the doctor confirmed that she would not be leaving today, patient became very upset. She started yelling and crying loudly in the hall, stating, "That fucker, he won't let me go. I am going to be letting down my nephew. I am always letting him down and he will not trust me." Patient received verbal redirection and go to her room with the nurse. Patient was able, with encouragement, to re-frame not leaving today to, "I am doing what's best for me, not worrying what is best for someone else." Patient was also able to have insight into her emotions, while not drunk, "This sucks.Emotions suck. But I can get through this."  Action. Emotional support and encouragement offered. Education provided on medication, indications and side effect. Q 15 minute checks done for safety. Response. Safety on the unit maintained through 15 minute checks.  Medications taken as prescribed. Attended groups. Remained calm and appropriate through out shift.

## 2017-02-18 NOTE — Tx Team (Signed)
Interdisciplinary Treatment and Diagnostic Plan Update 02/18/2017 Time of Session: 9:30am  Shirley Murphy  MRN: [id number]  Principal Diagnosis: Alcohol Use Disorder, Alcohol Induced Mood Disorder, Depressed   Secondary Diagnoses: Active Problems:   MDD (major depressive disorder), recurrent severe, without psychosis (HCC)   Current Medications:  Current Facility-Administered Medications  Medication Dose Route Frequency Provider Last Rate Last Dose  . acetaminophen (TYLENOL) tablet 650 mg  650 mg Oral Q6H PRN Laveda Abbe, NP   650 mg at 02/16/17 1427  . alum & mag hydroxide-simeth (MAALOX/MYLANTA) 200-200-20 MG/5ML suspension 30 mL  30 mL Oral Q4H PRN Laveda Abbe, NP   30 mL at 02/15/17 2128  . gabapentin (NEURONTIN) capsule 300 mg  300 mg Oral TID PC & HS Nwoko, Agnes I, NP   300 mg at 02/17/17 2122  . hydrOXYzine (ATARAX/VISTARIL) tablet 25 mg  25 mg Oral Q6H PRN Laveda Abbe, NP   25 mg at 02/17/17 2122  . magnesium hydroxide (MILK OF MAGNESIA) suspension 30 mL  30 mL Oral Daily PRN Laveda Abbe, NP   30 mL at 02/14/17 1318  . multivitamin with minerals tablet 1 tablet  1 tablet Oral Daily Laveda Abbe, NP   1 tablet at 02/17/17 0813  . naproxen (NAPROSYN) tablet 500 mg  500 mg Oral BID PRN Oneta Rack, NP   500 mg at 02/17/17 2212  . nicotine (NICODERM CQ - dosed in mg/24 hours) patch 21 mg  21 mg Transdermal Daily Cobos, Rockey Situ, MD   21 mg at 02/18/17 1478  . pneumococcal 23 valent vaccine (PNU-IMMUNE) injection 0.5 mL  0.5 mL Intramuscular Prior to discharge Cobos, Rockey Situ, MD      . sertraline (ZOLOFT) tablet 50 mg  50 mg Oral Daily Cobos, Rockey Situ, MD   50 mg at 02/17/17 0813  . thiamine (VITAMIN B-1) tablet 100 mg  100 mg Oral Daily Laveda Abbe, NP   100 mg at 02/17/17 0813  . traZODone (DESYREL) tablet 100 mg  100 mg Oral QHS Denzil Magnuson, NP   100 mg at 02/17/17 2122    PTA Medications: Prescriptions  Prior to Admission  Medication Sig Dispense Refill Last Dose  . hydrOXYzine (ATARAX/VISTARIL) 25 MG tablet Take 1 tablet (25 mg total) by mouth every 6 (six) hours as needed for anxiety. (Patient not taking: Reported on 02/11/2017) 30 tablet 0 Not Taking at Unknown time  . nicotine (NICODERM CQ - DOSED IN MG/24 HOURS) 21 mg/24hr patch Place 1 patch (21 mg total) onto the skin daily. (Patient not taking: Reported on 02/11/2017) 28 patch 0 Not Taking at Unknown time  . pantoprazole (PROTONIX) 40 MG tablet Take 1 tablet (40 mg total) by mouth daily. (Patient not taking: Reported on 02/11/2017) 30 tablet 0 Not Taking at Unknown time  . QUEtiapine (SEROQUEL) 300 MG tablet Take 1 tablet (300 mg total) by mouth at bedtime. (Patient not taking: Reported on 02/11/2017) 30 tablet 0 Not Taking at Unknown time  . sertraline (ZOLOFT) 50 MG tablet Take 1 tablet (50 mg total) by mouth daily. (Patient not taking: Reported on 02/11/2017) 30 tablet 0 Not Taking at Unknown time    Treatment Modalities: Medication Management, Group therapy, Case management,  1 to 1 session with clinician, Psychoeducation, Recreational therapy.  Patient Stressors: Financial difficulties Loss of children Medication change or noncompliance Substance abuse Patient Strengths: Ability for insight Capable of independent living  Physician Treatment Plan for Primary Diagnosis: Alcohol Use Disorder,  Alcohol Induced Mood Disorder, Depressed  Long Term Goal(s): Improvement in symptoms so as ready for discharge Short Term Goals: Ability to verbalize feelings will improve Ability to disclose and discuss suicidal ideas Ability to demonstrate self-control will improve Ability to identify and develop effective coping behaviors will improve Ability to maintain clinical measurements within normal limits will improve Ability to identify triggers associated with substance abuse/mental health issues will improve  Medication Management: Evaluate  patient's response, side effects, and tolerance of medication regimen.  Therapeutic Interventions: 1 to 1 sessions, Unit Group sessions and Medication administration.  Evaluation of Outcomes: Adequate for discharge   Physician Treatment Plan for Secondary Diagnosis: Active Problems:   MDD (major depressive disorder), recurrent severe, without psychosis (HCC)  Long Term Goal(s): Improvement in symptoms so as ready for discharge  Short Term Goals: Ability to verbalize feelings will improve Ability to disclose and discuss suicidal ideas Ability to demonstrate self-control will improve Ability to identify and develop effective coping behaviors will improve Ability to maintain clinical measurements within normal limits will improve Ability to identify triggers associated with substance abuse/mental health issues will improve  Medication Management: Evaluate patient's response, side effects, and tolerance of medication regimen.  Therapeutic Interventions: 1 to 1 sessions, Unit Group sessions and Medication administration.  Evaluation of Outcomes: Adequate for discharge   RN Treatment Plan for Primary Diagnosis: Alcohol Use Disorder, Alcohol Induced Mood Disorder, Depressed  Long Term Goal(s): Knowledge of disease and therapeutic regimen to maintain health will improve  Short Term Goals: Compliance with prescribed medications will improve  Medication Management: RN will administer medications as ordered by provider, will assess and evaluate patient's response and provide education to patient for prescribed medication. RN will report any adverse and/or side effects to prescribing provider.  Therapeutic Interventions: 1 on 1 counseling sessions, Psychoeducation, Medication administration, Evaluate responses to treatment, Monitor vital signs and CBGs as ordered, Perform/monitor CIWA, COWS, AIMS and Fall Risk screenings as ordered, Perform wound care treatments as ordered.  Evaluation of  Outcomes: Adequate for discharge   LCSW Treatment Plan for Primary Diagnosis: Alcohol Use Disorder, Alcohol Induced Mood Disorder, Depressed  Long Term Goal(s): Safe transition to appropriate next level of care at discharge, Engage patient in therapeutic group addressing interpersonal concerns. Short Term Goals: Engage patient in aftercare planning with referrals and resources, Facilitate patient progression through stages of change regarding substance use diagnoses and concerns, Identify triggers associated with mental health/substance abuse issues and Increase skills for wellness and recovery  Therapeutic Interventions: Assess for all discharge needs, 1 to 1 time with Social worker, Explore available resources and support systems, Assess for adequacy in community support network, Educate family and significant other(s) on suicide prevention, Complete Psychosocial Assessment, Interpersonal group therapy.  Evaluation of Outcomes: Adequate for discharge  Progress in Treatment: Attending groups: Yes Participating in groups: Yes Taking medication as prescribed: Yes,  Toleration medication: Yes,  Family/Significant other contact made: SPE completed with pt; pt declined to consent to family contact.  Patient understands diagnosis: Continuing to assess Discussing patient identified problems/goals with staff: Yes Medical problems stabilized or resolved: Yes Denies suicidal/homicidal ideation: Yes, per self report.  Issues/concerns per patient self-inventory: None Other: N/A  New problem(s) identified: None identified at this time.   New Short Term/Long Term Goal(s): None identified at this time.   Discharge Plan or Barriers: [facility] has [facility] for possible admission on Thursday at 745am.   Reason for Continuation of Hospitalization:   none  Estimated Length of Stay:  Thursday at 7am via taxi (02/19/17).   Attendees: Patient: 02/18/2017 8:47 AM  Physician: Dr. Jackquline Berlin MD 02/18/2017 8:47  AM  Nursing: Jobe Igo RN 02/18/2017 8:47 AM  RN Care Manager:  02/18/2017 8:47 AM  Social Worker: Trula Slade, LCSW 02/18/2017 8:47 AM  Recreational Therapist: x 02/18/2017 8:47 AM  Other: Armandina Stammer, NP; Claudette Head NP; Denzil Magnuson NP 02/18/2017 8:47 AM  Other:  02/18/2017 8:47 AM  Other: 02/18/2017 8:47 AM   Scribe for Treatment Team: Trula Slade, MSW, LCSW Clinical Social Worker 02/18/2017 8:47 AM

## 2017-02-18 NOTE — Progress Notes (Signed)
  [facility] :  Will you be returning to the same living situation after discharge:  No. [facility].  At discharge, do you have transportation home?: Yes,  taxi voucher in chart. PT SCHEDULED FOR 7AM DISCHARGE ON THURS TO [facility]IA [facility]AXI  Do you have the ability to pay for your medications: Yes,  mental health  Release of information consent forms completed and submitted to medical records by CSW.  Patient to Follow up at: Follow-up Information    [facility] Follow up on 02/19/2017.   Why:  Screening for possible admission on Thursday at 7:45AM. Please bring ID [place], 14 day supply of medications and prescriptions provided by the hospital. Thank you.  Contact information: [address] [place] [address] [phone]        [facility] Follow up.   Specialty:  Behavioral Health Why:  Walk in within 7 days of hospital discharge/treatment discharge to be seen for hospital follow-up and assessment for mental health services. Walk in hours: Mon-Fri 8am-9am. Thank you.  Contact information: [address] [place] [address] [phone]           Next level of care provider has [facility] to [facility] Link:no  Safety Planning and Suicide Prevention discussed: Yes,  SPE completed with pt; pt declined to consent to family contact. SPI pamphlet and Mobile Crisis information provided.  Have you used any form of tobacco in the last 30 days? (Cigarettes, Smokeless Tobacco, Cigars, and/or Pipes): Yes  Has patient been referred to the Quitline?: Patient refused referral  Patient has been referred for addiction treatment: Yes  Kaitrin Seybold N Smart LCSW 02/18/2017, 8:48 AM

## 2017-02-18 NOTE — Progress Notes (Signed)
D   Pt reports having a terrible day today as she found out she had some STD's and was upset at her boyfriend and knows she will have to discuss it with him    She was having a hard time trying to go to sleep   A    Verbal support given   Discussed healthy lifestyles    Medications administered and effectiveness monitored   Q 15 min checks R   Pt is safe at present time

## 2017-02-19 NOTE — Progress Notes (Signed)
Adult Psychoeducational Group Note  Date:  02/19/2017 Time:  1:56 AM  Group Topic/Focus:  Wrap-Up Group:   The focus of this group is to help patients review their daily goal of treatment and discuss progress on daily workbooks.  Participation Level:  Did Not Attend  Participation Quality:  Patient did not attend  Affect:  Patient did not attend  Cognitive:  Patient did not attend  Insight: None  Engagement in Group:  Patient did not attend  Modes of Intervention:  Patient did not attend  Additional Comments:  Patient attended the NA meeting tonight.  Felipa FurnaceChristopher  Khyan Oats 02/19/2017, 1:56 AM

## 2017-02-19 NOTE — Progress Notes (Signed)
Nurs Dischg Note:  D:Patient denies SI/HI at this time. Pt appears calm and cooperative, and no distress noted.  A: All Personal items in locker returned to pt. Pt escorted to lobby to wait for cab to [facility].  R:  Pt States she will comply with outpatient services, and take MEDS as prescribed. 

## 2017-03-02 ENCOUNTER — Encounter (HOSPITAL_COMMUNITY): Payer: Self-pay | Admitting: *Deleted

## 2017-03-02 ENCOUNTER — Inpatient Hospital Stay (HOSPITAL_COMMUNITY)
Admission: AD | Admit: 2017-03-02 | Discharge: 2017-03-05 | DRG: 885 | Disposition: A | Discharge status: Home / Self Care | Payer: Medicaid - Out of State | Attending: Psychiatry | Admitting: Psychiatry

## 2017-03-02 DIAGNOSIS — I499 Cardiac arrhythmia, unspecified: Secondary | ICD-10-CM | POA: Diagnosis present

## 2017-03-02 DIAGNOSIS — F1490 Cocaine use, unspecified, uncomplicated: Secondary | ICD-10-CM | POA: Diagnosis not present

## 2017-03-02 DIAGNOSIS — F332 Major depressive disorder, recurrent severe without psychotic features: Secondary | ICD-10-CM

## 2017-03-02 DIAGNOSIS — I10 Essential (primary) hypertension: Secondary | ICD-10-CM | POA: Diagnosis present

## 2017-03-02 DIAGNOSIS — F1994 Other psychoactive substance use, unspecified with psychoactive substance-induced mood disorder: Secondary | ICD-10-CM | POA: Diagnosis not present

## 2017-03-02 DIAGNOSIS — R45851 Suicidal ideations: Secondary | ICD-10-CM | POA: Diagnosis present

## 2017-03-02 DIAGNOSIS — Z800 Family history of malignant neoplasm of digestive organs: Secondary | ICD-10-CM | POA: Diagnosis not present

## 2017-03-02 DIAGNOSIS — Z8619 Personal history of other infectious and parasitic diseases: Secondary | ICD-10-CM

## 2017-03-02 DIAGNOSIS — F1024 Alcohol dependence with alcohol-induced mood disorder: Secondary | ICD-10-CM | POA: Diagnosis present

## 2017-03-02 DIAGNOSIS — F339 Major depressive disorder, recurrent, unspecified: Secondary | ICD-10-CM | POA: Diagnosis not present

## 2017-03-02 DIAGNOSIS — F1094 Alcohol use, unspecified with alcohol-induced mood disorder: Secondary | ICD-10-CM | POA: Diagnosis not present

## 2017-03-02 DIAGNOSIS — F17210 Nicotine dependence, cigarettes, uncomplicated: Secondary | ICD-10-CM | POA: Diagnosis present

## 2017-03-02 DIAGNOSIS — Z818 Family history of other mental and behavioral disorders: Secondary | ICD-10-CM

## 2017-03-02 LAB — CBC
HCT: 37 % (ref 36.0–46.0)
HEMOGLOBIN: 11.9 g/dL — AB (ref 12.0–15.0)
MCH: 26.6 pg (ref 26.0–34.0)
MCHC: 32.2 g/dL (ref 30.0–36.0)
MCV: 82.6 fL (ref 78.0–100.0)
Platelets: 292 10*3/uL (ref 150–400)
RBC: 4.48 MIL/uL (ref 3.87–5.11)
RDW: 16.1 % — ABNORMAL HIGH (ref 11.5–15.5)
WBC: 9.1 10*3/uL (ref 4.0–10.5)

## 2017-03-02 LAB — COMPREHENSIVE METABOLIC PANEL
ALK PHOS: 52 U/L (ref 38–126)
ALT: 16 U/L (ref 14–54)
AST: 17 U/L (ref 15–41)
Albumin: 4.2 g/dL (ref 3.5–5.0)
Anion gap: 6 (ref 5–15)
BUN: 18 mg/dL (ref 6–20)
CALCIUM: 8.9 mg/dL (ref 8.9–10.3)
CO2: 26 mmol/L (ref 22–32)
Chloride: 106 mmol/L (ref 101–111)
Creatinine, Ser: 1.26 mg/dL — ABNORMAL HIGH (ref 0.44–1.00)
GFR, EST NON AFRICAN AMERICAN: 57 mL/min — AB (ref >60)
Glucose, Bld: 76 mg/dL (ref 65–99)
Potassium: 4 mmol/L (ref 3.5–5.1)
SODIUM: 138 mmol/L (ref 135–145)
TOTAL PROTEIN: 7.5 g/dL (ref 6.5–8.1)
Total Bilirubin: 0.4 mg/dL (ref 0.3–1.2)

## 2017-03-02 LAB — RAPID URINE DRUG SCREEN, HOSP PERFORMED
Amphetamines: NOT DETECTED
BARBITURATES: NOT DETECTED
Benzodiazepines: NOT DETECTED
COCAINE: POSITIVE — AB
Opiates: NOT DETECTED
Tetrahydrocannabinol: NOT DETECTED

## 2017-03-02 LAB — URINALYSIS, ROUTINE W REFLEX MICROSCOPIC
Bilirubin Urine: NEGATIVE
Glucose, UA: NEGATIVE mg/dL
Hgb urine dipstick: NEGATIVE
Ketones, ur: NEGATIVE mg/dL
LEUKOCYTES UA: NEGATIVE
Nitrite: NEGATIVE
PROTEIN: NEGATIVE mg/dL
SPECIFIC GRAVITY, URINE: 1.02 (ref 1.005–1.030)
pH: 5 (ref 5.0–8.0)

## 2017-03-02 LAB — PREGNANCY, URINE: PREG TEST UR: NEGATIVE

## 2017-03-02 LAB — ETHANOL

## 2017-03-02 MED ORDER — SERTRALINE HCL 50 MG PO TABS
50.0000 mg | ORAL_TABLET | Freq: Every day | ORAL | Status: DC
  Administered 2017-03-03 – 2017-03-04 (×2): 50 mg via ORAL
  Filled 2017-03-02: qty 1
  Filled 2017-03-02 (×2): qty 14
  Filled 2017-03-02 (×2): qty 1

## 2017-03-02 MED ORDER — NICOTINE 21 MG/24HR TD PT24
21.0000 mg | MEDICATED_PATCH | TRANSDERMAL | Status: DC
  Administered 2017-03-02 – 2017-03-04 (×3): 21 mg via TRANSDERMAL
  Filled 2017-03-02: qty 1
  Filled 2017-03-02 (×2): qty 14
  Filled 2017-03-02 (×3): qty 1

## 2017-03-02 MED ORDER — ACETAMINOPHEN 325 MG PO TABS
650.0000 mg | ORAL_TABLET | Freq: Four times a day (QID) | ORAL | Status: DC | PRN
  Administered 2017-03-02 – 2017-03-04 (×5): 650 mg via ORAL
  Filled 2017-03-02 (×5): qty 2

## 2017-03-02 MED ORDER — HYDROXYZINE HCL 50 MG PO TABS
50.0000 mg | ORAL_TABLET | Freq: Once | ORAL | Status: AC
  Administered 2017-03-02: 50 mg via ORAL
  Filled 2017-03-02 (×2): qty 1

## 2017-03-02 MED ORDER — TRAZODONE HCL 50 MG PO TABS
50.0000 mg | ORAL_TABLET | Freq: Every evening | ORAL | Status: DC | PRN
  Administered 2017-03-02 – 2017-03-04 (×6): 50 mg via ORAL
  Filled 2017-03-02 (×4): qty 1
  Filled 2017-03-02: qty 2
  Filled 2017-03-02: qty 1
  Filled 2017-03-02: qty 28
  Filled 2017-03-02: qty 1
  Filled 2017-03-02 (×3): qty 28
  Filled 2017-03-02 (×2): qty 1

## 2017-03-02 MED ORDER — QUETIAPINE FUMARATE 25 MG PO TABS
25.0000 mg | ORAL_TABLET | Freq: Every day | ORAL | Status: DC
  Filled 2017-03-02: qty 1

## 2017-03-02 MED ORDER — MAGNESIUM HYDROXIDE 400 MG/5ML PO SUSP: 30.0000 mL | Freq: Every day | ORAL | Status: DC | PRN

## 2017-03-02 MED ORDER — HYDROXYZINE HCL 25 MG PO TABS
25.0000 mg | ORAL_TABLET | Freq: Four times a day (QID) | ORAL | Status: DC | PRN
  Administered 2017-03-02 – 2017-03-04 (×5): 25 mg via ORAL
  Filled 2017-03-02 (×2): qty 1
  Filled 2017-03-02: qty 20
  Filled 2017-03-02 (×4): qty 1

## 2017-03-02 MED ORDER — ALUM & MAG HYDROXIDE-SIMETH 200-200-20 MG/5ML PO SUSP: 30.0000 mL | ORAL | Status: DC | PRN
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
## 2017-03-02 NOTE — Tx Team (Signed)
Initial Treatment Plan 03/02/2017 5:43 PM Shirley Murphy UJW:[id number]    PATIENT STRESSORS: Financial difficulties Marital or family conflict Substance abuse   PATIENT STRENGTHS: Ability for insight Average or above average intelligence Communication skills Physical Health Supportive family/friends   PATIENT IDENTIFIED PROBLEMS: Substance Abuse  "Mental Health"  "Detox Recovery"                 DISCHARGE CRITERIA:  Ability to meet basic life and health needs Improved stabilization in mood, thinking, and/or behavior Motivation to continue treatment in a less acute level of care Need for constant or close observation no longer present Withdrawal symptoms are absent or subacute and managed without 24-hour nursing intervention  PRELIMINARY DISCHARGE PLAN: Attend 12-step recovery group Outpatient therapy Placement in alternative living arrangements  PATIENT/FAMILY INVOLVEMENT: This treatment plan has been presented to and reviewed with the patient, Shirley Murphy.  The patient and family have been given the opportunity to ask questions and make suggestions.  Carleene OverlieMiddleton, Leitha Hyppolite P, RN 03/02/2017, 5:43 PM

## 2017-03-02 NOTE — H&P (Signed)
Behavioral Health Medical Screening Exam  Shirley Murphy is an 31 y.o. female reporting increased depression and unable to control her drug use. Patient is suicidal with plan to cut her wrists. She is unable to contract for safety. She reports that she overdosed "on 30 neurontin after [facility]   Total Time spent with patient: 20 minutes  Psychiatric Specialty Exam: Physical Exam  Review of Systems  Psychiatric/Behavioral: Positive for depression, substance abuse and suicidal ideas. The patient is nervous/anxious.     Blood pressure 130/79, pulse 78, temperature 98.3 F (36.8 C), temperature source Oral, resp. rate 20, last menstrual period 02/02/2017.There is no height or weight on file to calculate BMI.  General Appearance: Casual  Eye Contact:  Good  Speech:  Clear and Coherent  Volume:  Normal  Mood:  Anxious  Affect:  Depressed  Thought Process:  Coherent  Orientation:  Full (Time, Place, and Person)  Thought Content:  Rumination  Suicidal Thoughts:  Yes.  with intent/plan  Homicidal Thoughts:  No  Memory:  Immediate;   Good Recent;   Good Remote;   Good  Judgement:  Impaired  Insight:  Present  Psychomotor Activity:  Normal  Concentration: Concentration: Good and Attention Span: Good  Recall:  Good  Fund of Knowledge:Good  Language: Good  Akathisia:  No  Handed:  Right  AIMS (if indicated):     Assets:  Communication Skills Desire for Improvement Intimacy Leisure Time Physical Health Resilience Social Support  Sleep:       Musculoskeletal: Strength & Muscle Tone: within normal limits Gait & Station: normal Patient leans: N/A  Blood pressure 130/79, pulse 78, temperature 98.3 F (36.8 C), temperature source Oral, resp. rate 20, last menstrual period 02/02/2017.  Recommendations:  Based on my evaluation the patient does not appear to have an emergency medical condition.  Fransisca KaufmannAVIS, Marley Charlot, NP 03/02/2017, 3:36 PM
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
## 2017-03-02 NOTE — Progress Notes (Signed)
Nursing Progress Note: 7p-7a D: Pt currently presents with a anxious/pleasant affect and behavior. Pt states "I was taking 300mg  of Seroquel at one point. I don't know how I am supposed to take 25mg . I'm just back again because things just never go as plan." Interacting appropriately with milieu. Pt reports good sleep during the previous night with current medication regimen.   A: Pt provided with medications per providers orders. Pt's labs and vitals were monitored throughout the night. Pt supported emotionally and encouraged to express concerns and questions. Pt educated on medications.  R: Pt's safety ensured with 15 minute and environmental checks. Pt currently denies SI, HI, and AVH. Pt verbally contracts to seek staff if SI,HI, or AVH occurs and to consult with staff before acting on any harmful thoughts. Will continue to monitor.

## 2017-03-02 NOTE — Progress Notes (Signed)
Patient ID: Shirley Murphy, female   DOB: 1986-02-24, 31 y.o.   MRN: [id number]  Patient came onto the unit and reported high anxiety and reported, "I'm not here to be disrespected." Patient felt as though admit RN was disrespectful during admission process. Patient soon apologized and reported that she was craving Alcohol and "feeling anxious." She also reported that she may be agitated due to not having nicotine. Patient was given a nicotine patch and one time dose of 50 mg Vistaril. Patient is less anxious and irritable after administration of medications. She is now on the 300 hall and reports feeling better about this. Q15 minute safety checks are maintained.

## 2017-03-03 ENCOUNTER — Encounter (HOSPITAL_COMMUNITY): Payer: Self-pay | Admitting: Behavioral Health

## 2017-03-03 DIAGNOSIS — F332 Major depressive disorder, recurrent severe without psychotic features: Principal | ICD-10-CM

## 2017-03-03 DIAGNOSIS — F1094 Alcohol use, unspecified with alcohol-induced mood disorder: Secondary | ICD-10-CM

## 2017-03-03 LAB — GC/CHLAMYDIA PROBE AMP (~~LOC~~) NOT AT ARMC
CHLAMYDIA, DNA PROBE: NEGATIVE
NEISSERIA GONORRHEA: NEGATIVE

## 2017-03-03 MED ORDER — ONDANSETRON 4 MG PO TBDP
4.0000 mg | ORAL_TABLET | Freq: Four times a day (QID) | ORAL | Status: DC | PRN
  Administered 2017-03-03: 4 mg via ORAL
  Filled 2017-03-03: qty 1

## 2017-03-03 MED ORDER — ADULT MULTIVITAMIN W/MINERALS CH
1.0000 | ORAL_TABLET | Freq: Every day | ORAL | Status: DC
  Administered 2017-03-03 – 2017-03-04 (×2): 1 via ORAL
  Filled 2017-03-03 (×6): qty 1

## 2017-03-03 MED ORDER — LOPERAMIDE HCL 2 MG PO CAPS: 2.0000 mg | ORAL_CAPSULE | ORAL | Status: DC | PRN

## 2017-03-03 MED ORDER — HYDROXYZINE HCL 25 MG PO TABS: 25.0000 mg | ORAL_TABLET | Freq: Four times a day (QID) | ORAL | Status: DC | PRN

## 2017-03-03 MED ORDER — VITAMIN B-1 100 MG PO TABS
100.0000 mg | ORAL_TABLET | Freq: Every day | ORAL | Status: DC
  Administered 2017-03-04: 100 mg via ORAL
  Filled 2017-03-03 (×4): qty 1

## 2017-03-03 MED ORDER — CHLORDIAZEPOXIDE HCL 25 MG PO CAPS
25.0000 mg | ORAL_CAPSULE | Freq: Four times a day (QID) | ORAL | Status: DC | PRN
  Administered 2017-03-03 – 2017-03-04 (×4): 25 mg via ORAL
  Filled 2017-03-03 (×4): qty 1

## 2017-03-03 MED ORDER — THIAMINE HCL 100 MG/ML IJ SOLN
100.0000 mg | Freq: Once | INTRAMUSCULAR | Status: AC
  Administered 2017-03-03: 100 mg via INTRAMUSCULAR
  Filled 2017-03-03: qty 2

## 2017-03-03 NOTE — Progress Notes (Signed)
1:1 began [phone] for patient safety.  Dr. Lucianne MussKumar and Marquita PalmsAkeysha talking with patient at this time in patient's room.  Respirations even and unlabored.  No signs/symptoms of pain/distress noted on patient's face/body movements.  Safety maintained with 1:1.

## 2017-03-03 NOTE — Progress Notes (Signed)
1015  Patient has 1:1 for safety.  Patient laying in bed with eyes closed.  Respirations even and unlabored.  No signs/symptoms of pain/distress noted on patient's face/body movements.  Safety maintained with 1:1 per MD order.

## 2017-03-03 NOTE — Progress Notes (Signed)
1:1 Note 1300  Patient ate lunch in dayroom with 1:1 present.  Respirations even and unlabored.  No signs/symptoms of pain/distress noted on patient's face/body movements.   1:1 continues per MD order for safety.
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
## 2017-03-03 NOTE — Progress Notes (Signed)
1:1 Note 1300  Patient laying in bed with eyes closed, resting comfortably with 1:1 present for safety.  Patient had been reading book in her room earlier.  Respirations even and unlabored.  No signs/symptoms of pain/distress noted on patient's face/body movements.  1:1 continues for safety.

## 2017-03-03 NOTE — Progress Notes (Signed)
EKG completed and given to NP for review.  

## 2017-03-03 NOTE — [facility] Counselor (Signed)
CSW attempted to complete PSA with pt. Pt was in bed sleeping. 1:1 requested that CSW allow pt to sleep. CSW agreed.   Jonathon JordanLynn B Zynasia Burklow, MSW, Theresia MajorsLCSWA [phone]

## 2017-03-03 NOTE — Progress Notes (Signed)
1:1 Note 1915  Librium given patient for severe anxiety/agitation.  Patient ate dinner with 1:1 present for safety.  Patient was in dayroom talking to female patient, swinging her arms, standing near female patient's feet.  1:1 continues for patient's safety.  Respirations even and unlabored.  No signs/symptoms of pain/distress noted on patient's face/body movements.  1:1 continues for safety per MD order.
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
## 2017-03-03 NOTE — [facility] Group Notes (Signed)
[facility] LCSW Group Therapy 03/03/2017 1:15 PM  Type of Therapy: Group Therapy- Feelings about Diagnosis  Participation Level: Pt invited. Did not attend.  Corrin Sieling, MSW, LCSW 03/03/2017 3:56 PM      

## 2017-03-03 NOTE — Progress Notes (Signed)
1:1 Note 1530  Patient continues to lay in bed with eyes closed.  Respirations even and unlabored.  No signs/symptoms of pain/distress noted on patient's face/body movements.  1:1 continues per MD order for safety.

## 2017-03-03 NOTE — Plan of Care (Signed)
Problem: Education: Goal: Emotional status will improve Outcome: Progressing Nurse discussed depression/anxiety/coping skills on patient.
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
## 2017-03-04 DIAGNOSIS — F1490 Cocaine use, unspecified, uncomplicated: Secondary | ICD-10-CM

## 2017-03-04 DIAGNOSIS — F1994 Other psychoactive substance use, unspecified with psychoactive substance-induced mood disorder: Secondary | ICD-10-CM

## 2017-03-04 DIAGNOSIS — I10 Essential (primary) hypertension: Secondary | ICD-10-CM

## 2017-03-04 DIAGNOSIS — F17210 Nicotine dependence, cigarettes, uncomplicated: Secondary | ICD-10-CM

## 2017-03-04 DIAGNOSIS — F339 Major depressive disorder, recurrent, unspecified: Secondary | ICD-10-CM

## 2017-03-04 MED ORDER — LISINOPRIL 20 MG PO TABS
20.0000 mg | ORAL_TABLET | Freq: Every day | ORAL | Status: AC
  Administered 2017-03-04: 20 mg via ORAL
  Filled 2017-03-04: qty 1

## 2017-03-04 MED ORDER — TRAZODONE HCL 50 MG PO TABS: 50.0000 mg | ORAL_TABLET | Freq: Every evening | ORAL | 0 refills | Status: DC | PRN

## 2017-03-04 MED ORDER — SERTRALINE HCL 50 MG PO TABS: 50.0000 mg | ORAL_TABLET | Freq: Every day | ORAL | 0 refills | Status: DC

## 2017-03-04 MED ORDER — NICOTINE 21 MG/24HR TD PT24: 21.0000 mg | MEDICATED_PATCH | TRANSDERMAL | 0 refills | Status: DC

## 2017-03-04 MED ORDER — LISINOPRIL 10 MG PO TABS
10.0000 mg | ORAL_TABLET | Freq: Every day | ORAL | 0 refills | Status: DC
Start: 2017-03-05 — End: 2017-04-08

## 2017-03-04 MED ORDER — HYDROXYZINE HCL 25 MG PO TABS: 25.0000 mg | ORAL_TABLET | Freq: Four times a day (QID) | ORAL | 0 refills | Status: DC | PRN

## 2017-03-04 MED ORDER — LISINOPRIL 10 MG PO TABS
10.0000 mg | ORAL_TABLET | Freq: Every day | ORAL | Status: DC
  Filled 2017-03-04 (×2): qty 1
  Filled 2017-03-04 (×2): qty 14

## 2017-03-04 MED ORDER — NAPROXEN 500 MG PO TABS
500.0000 mg | ORAL_TABLET | Freq: Two times a day (BID) | ORAL | Status: DC
  Filled 2017-03-04 (×6): qty 1

## 2017-03-04 NOTE — Progress Notes (Signed)
[facility] Post 1:1 Observation Documentation  For the first (8) hours following discontinuation of 1:1 precautions, a progress note entry by nursing staff should be documented at least every 2 hours, reflecting the patient's behavior, condition, mood, and conversation.  Use the progress notes for additional entries.  Time 1:1 discontinued:  1005  Patient's Behavior:  Calm and cooperative   Patient's Condition:  Safety maintained   Patient's Conversation:  Logical/coherent   Shirley Murphy 03/04/2017, 1400

## 2017-03-04 NOTE — Progress Notes (Signed)
Pt has out of state Medicaid but per MD request, CSW called [facility] [phone]) to try to get a screening date for the pt. [facility] representative confirmed that [facility] to having out of state Medicaid. CSW expressed her understanding. Additionally, [facility] via fax number [phone]. Awaiting response on bed availability.  Jonathon JordanLynn B Fatumata Kashani, MSW, Theresia MajorsLCSWA [phone]
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
## 2017-03-04 NOTE — Progress Notes (Signed)
Nursing Note 1:1 Patient awake in dayroom drinking coffee and chatting with peers. Stated she had a good night sleep except when she was awakened by nightmare but she fell right back to sleep. In the cafeteria at this time eating breakfast. No behavior issues noted. Staff will continue to monitor patient.  Patient remains on 1:1 for safety.
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
## 2017-03-04 NOTE — Progress Notes (Signed)
[facility] Post 1:1 Observation Documentation  For the first (8) hours following discontinuation of 1:1 precautions, a progress note entry by nursing staff should be documented at least every 2 hours, reflecting the patient's behavior, condition, mood, and conversation.  Use the progress notes for additional entries.  Time 1:1 discontinued:  1005   Patient's Behavior:  cooperative  Patient's Condition:  Safety maintained  Patient's Conversation:  Logical/coherent  Zulma Court L 03/04/2017,

## 2017-03-04 NOTE — Progress Notes (Signed)
Nursing Note 1:1 Patient in bed with eyes closed. Respiration even and unlabored. No distress noted. Sitter within reach. Will continue to monitor patient.  Patient remains on 1:1 for safety.  
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
## 2017-03-04 NOTE — [facility] Group Notes (Signed)
[facility] LCSW Group Therapy  03/04/2017 1:57 PM  Type of Therapy:  Group Therapy  Participation Level:  Did Not Attend-pt invited. Chose to remain in bed.   Summary of Progress/Problems: Today's Topic: Overcoming Obstacles. Patients identified one short term goal and potential obstacles in reaching this goal. Patients processed barriers involved in overcoming these obstacles. Patients identified steps necessary for overcoming these obstacles and explored motivation (internal and external) for facing these difficulties head on.   Shirley Murphy N Smart LCSW 03/04/2017, 1:57 PM

## 2017-03-04 NOTE — Progress Notes (Signed)
[facility] Post 1:1 Observation Documentation  For the first (8) hours following discontinuation of 1:1 precautions, a progress note entry by nursing staff should be documented at least every 2 hours, reflecting the patient's behavior, condition, mood, and conversation.  Use the progress notes for additional entries.  Time 1:1 discontinued:  1005 am  Patient's Behavior:  asleep  Patient's Condition:  Safety maintained  Patient's Conversation:    Bernis Stecher L 03/04/2017, 4:08 PM

## 2017-03-04 NOTE — Progress Notes (Signed)
Patient ID: Shirley Murphy, female   DOB: 1986/03/19, 31 y.o.   MRN: [id number]  Pt currently presents with an anxious affect and restless behavior. Pt speech is pressured and mood is labile. Pt seen answering the phone on the hallway and then hanging up on people on the other end. [facility]  Pt reports good sleep with current medication regimen, says she still has night terrors.   Pt provided with medications per providers orders. Pt's labs and vitals were monitored throughout the night. Pt given a 1:1 about emotional and mental status. Pt supported and encouraged to express concerns and questions. Pt educated on medications.  Pt's safety ensured with 15 minute and environmental checks. Pt currently denies SI/HI and A/V hallucinations. Pt verbally agrees to seek staff if SI/HI or A/VH occurs and to consult with staff before acting on any harmful thoughts. Will continue POC.

## 2017-03-04 NOTE — Progress Notes (Signed)
Spoke with patient on individual basis for goals/ wrap up group. Pt shared her day started off "bad" pt stated she felt "scared earlier today" but she "talked to staff." Pt shared she "wanted to not have a staff member with her." Encouraged patient it for safety reasons for a staff member to be with her. Pt shared that she wants "resources for alcohol and drugs." Pt shared that [facility] denied [facility] because her blood pressure was high and she it became "depressing" and "begin using again." Encouraged pt to speak with social worker about places that have meetings for alcohol and substance abuse.

## 2017-03-04 NOTE — Progress Notes (Signed)
[facility] Post 1:1 Observation Documentation  For the first (8) hours following discontinuation of 1:1 precautions, a progress note entry by nursing staff should be documented at least every 2 hours, reflecting the patient's behavior, condition, mood, and conversation.  Use the progress notes for additional entries.  Time 1:1 discontinued:  1005 am  Patient's Behavior:  Calm and cooperative.  Patient's Condition:  Safety maintained   Patient's Conversation:  Logical/coherent Jareb Radoncic L 03/04/2017, 4:06 PM

## 2017-03-04 NOTE — Progress Notes (Signed)
Recreation Therapy Notes   Date: 03/04/2017 Time: 9:30am Location: [address] Dayroom  Group Topic: Stress Management  Goal Area(s) Addresses:  Patient will verbalize importance of using healthy stress management.  Patient will identify positive emotions associated with healthy stress management.   Behavioral Response: Engaged  Intervention: Stress Management  Activity :  Meditation. Recreation Therapy Intern introduced the stress management technique of meditation. Recreation Therapy Intern read a script that allowed patients to help patients meditate. Recreation Therapy Intern played zen meditation music. Patients were to follow along as script was read to engage in the activity.  Education: Stress Management, Discharge Planning.   Education Outcome: Acknowledges edcuation  Clinical Observations/Feedback: Pt attended group.  Rachel Meyer, Recreation Therapy Intern  Javin Nong, LRT/CTRS 

## 2017-03-04 NOTE — Tx Team (Signed)
Interdisciplinary Treatment and Diagnostic Plan Update 03/04/2017 Time of Session: 9:30am  Khadija Thier  MRN: [id number]  Principal Diagnosis: MDD (major depressive disorder), recurrent episode ([place])  Secondary Diagnoses: Principal Problem:   MDD (major depressive disorder), recurrent episode ([place]) Active Problems:   HTN (hypertension), benign   Irregular heart beat   Suicidal ideation   Current Medications:  Current Facility-Administered Medications  Medication Dose Route Frequency Provider Last Rate Last Dose  . acetaminophen (TYLENOL) tablet 650 mg  650 mg Oral Q6H PRN Elmarie Shiley A, NP   650 mg at 03/04/17 0815  . alum & mag hydroxide-simeth (MAALOX/MYLANTA) 200-200-20 MG/5ML suspension 30 mL  30 mL Oral Q4H PRN Elmarie Shiley A, NP      . chlordiazePOXIDE (LIBRIUM) capsule 25 mg  25 mg Oral Q6H PRN Mordecai Maes, NP   25 mg at 03/04/17 0815  . hydrOXYzine (ATARAX/VISTARIL) tablet 25 mg  25 mg Oral Q6H PRN Elmarie Shiley A, NP   25 mg at 03/03/17 1706  . loperamide (IMODIUM) capsule 2-4 mg  2-4 mg Oral PRN Mordecai Maes, NP      . magnesium hydroxide (MILK OF MAGNESIA) suspension 30 mL  30 mL Oral Daily PRN Elmarie Shiley A, NP      . multivitamin with minerals tablet 1 tablet  1 tablet Oral Daily Mordecai Maes, NP   1 tablet at 03/04/17 0815  . nicotine (NICODERM CQ - dosed in mg/24 hours) patch 21 mg  21 mg Transdermal Q24H Elmarie Shiley A, NP   21 mg at 03/03/17 0840  . ondansetron (ZOFRAN-ODT) disintegrating tablet 4 mg  4 mg Oral Q6H PRN Mordecai Maes, NP   4 mg at 03/03/17 1156  . sertraline (ZOLOFT) tablet 50 mg  50 mg Oral Daily Elmarie Shiley A, NP   50 mg at 03/04/17 0815  . thiamine (VITAMIN B-1) tablet 100 mg  100 mg Oral Daily Mordecai Maes, NP   100 mg at 03/04/17 0815  . traZODone (DESYREL) tablet 50 mg  50 mg Oral QHS,MR X 1 Laverle Hobby, PA-C   50 mg at 03/03/17 2211    PTA Medications: Prescriptions Prior to Admission  Medication Sig Dispense  Refill Last Dose  . gabapentin (NEURONTIN) 300 MG capsule Take 1 capsule (300 mg total) by mouth 4 (four) times daily - after meals and at bedtime. 120 capsule 0 Past Month at Unknown time  . hydrOXYzine (ATARAX/VISTARIL) 25 MG tablet Take 1 tablet (25 mg total) by mouth every 6 (six) hours as needed for anxiety. For anxiety 30 tablet 0 Past Month at Unknown time  . nicotine (NICODERM CQ - DOSED IN MG/24 HOURS) 21 mg/24hr patch Place 1 patch (21 mg total) onto the skin daily. 28 patch 0 Past Month at Unknown time  . sertraline (ZOLOFT) 50 MG tablet Take 1 tablet (50 mg total) by mouth daily. For depression 30 tablet 0 Past Month at Unknown time  . traZODone (DESYREL) 100 MG tablet Take 1 tablet (100 mg total) by mouth at bedtime. 30 tablet 0 Past Month at Unknown time    Treatment Modalities: Medication Management, Group therapy, Case management,  1 to 1 session with clinician, Psychoeducation, Recreational therapy.  Patient Stressors: Financial difficulties Marital or family conflict Substance abuse Patient Strengths: Ability for insight Average or above average [employer] cabin [employer] Physical Health Supportive family/friends  Physician Treatment Plan for Primary Diagnosis: MDD (major depressive disorder), recurrent episode ([place]) Long Term Goal(s): Improvement in symptoms so as ready for discharge  Short Term Goals: Ability to verbalize feelings will improve Ability to disclose and discuss suicidal ideas Ability to demonstrate self-control will improve Ability to identify and develop effective coping behaviors will improve Ability to maintain clinical measurements within normal limits will improve Ability to identify triggers associated with substance abuse/mental health issues will improve  Medication Management: Evaluate patient's response, side effects, and tolerance of medication regimen.  Therapeutic Interventions: 1 to 1 sessions, Unit Group sessions and Medication  administration.  Evaluation of Outcomes: Not Met  Physician Treatment Plan for Secondary Diagnosis: Principal Problem:   MDD (major depressive disorder), recurrent episode ([place]) Active Problems:   HTN (hypertension), benign   Irregular heart beat   Suicidal ideation  Long Term Goal(s): Improvement in symptoms so as ready for discharge  Short Term Goals: Ability to verbalize feelings will improve Ability to disclose and discuss suicidal ideas Ability to demonstrate self-control will improve Ability to identify and develop effective coping behaviors will improve Ability to maintain clinical measurements within normal limits will improve Ability to identify triggers associated with substance abuse/mental health issues will improve  Medication Management: Evaluate patient's response, side effects, and tolerance of medication regimen.  Therapeutic Interventions: 1 to 1 sessions, Unit Group sessions and Medication administration.  Evaluation of Outcomes: Not Met  RN Treatment Plan for Primary Diagnosis: MDD (major depressive disorder), recurrent episode ([place]) Long Term Goal(s): Knowledge of disease and therapeutic regimen to maintain health will improve  Short Term Goals: Compliance with prescribed medications will improve  Medication Management: RN will administer medications as ordered by provider, will assess and evaluate patient's response and provide education to patient for prescribed medication. RN will report any adverse and/or side effects to prescribing provider.  Therapeutic Interventions: 1 on 1 counseling sessions, Psychoeducation, Medication administration, Evaluate responses to treatment, Monitor vital signs and CBGs as ordered, Perform/monitor CIWA, COWS, AIMS and Fall Risk screenings as ordered, Perform wound care treatments as ordered.  Evaluation of Outcomes: Not Met  LCSW Treatment Plan for Primary Diagnosis: MDD (major depressive disorder), recurrent episode  ([place]) Long Term Goal(s): Safe transition to appropriate next level of care at discharge, Engage patient in therapeutic group addressing interpersonal concerns. Short Term Goals: Engage patient in aftercare planning with referrals and resources, Increase emotional regulation, Facilitate patient progression through stages of change regarding substance use diagnoses and concerns, Identify triggers associated with mental health/substance abuse issues and Increase skills for wellness and recovery  Therapeutic Interventions: Assess for all discharge needs, 1 to 1 time with Social worker, Explore available resources and support systems, Assess for adequacy in community support network, Educate family and significant other(s) on suicide prevention, Complete Psychosocial Assessment, Interpersonal group therapy.  Evaluation of Outcomes: Not Met  Progress in Treatment: Attending groups: Pt is new to milieu, continuing to assess  Participating in groups: Pt is new to milieu, continuing to assess  Taking medication as prescribed: Yes, MD continues to assess for medication changes as needed Toleration medication: Yes, no side effects reported at this time Family/Significant other contact made: No, CSW assessing for appropriate contact Patient understands diagnosis: Continuing to assess Discussing patient identified problems/goals with staff: Yes Medical problems stabilized or resolved: Yes Denies suicidal/homicidal ideation: No, pt recently admitted for SI. Issues/concerns per patient self-inventory: None Other: N/A  New problem(s) identified: None identified at this time.   New Short Term/Long Term Goal(s): None identified at this time.   Discharge Plan or Barriers: Pt will likely discharge to [facility] for residential treatment.  Reason for  Continuation of Hospitalization:  Anxiety  Depression Medication stabilization Suicidal ideation Withdrawal symptoms  Estimated Length of Stay: 3-5 days;  Estimated discharge date 03/09/17  Attendees: Patient: 03/04/2017 9:41 AM  Physician: Dr. Parke Poisson 03/04/2017 9:41 AM  Nursing: Chrys Racer RN; [place], RN 03/04/2017 9:41 AM  RN Care Manager: Lars Pinks, RN 03/04/2017 9:41 AM  Social Worker: Matthew Saras, [place] 03/04/2017 9:41 AM  Recreational Therapist:  03/04/2017 9:41 AM  Other: Lindell Spar, NP 03/04/2017 9:41 AM  Other:  03/04/2017 9:41 AM  Other: 03/04/2017 9:41 AM  Scribe for Treatment Team: Georga Kaufmann, MSW,LCSWA 03/04/2017 9:41 AM

## 2017-03-04 NOTE — Progress Notes (Signed)
CSW discussed case with MD, and [facility]. Pt will inform [facility] staff that she does not have insurance. If pt is turned away, pt will go to stay with her sister and continue to call [facility] for available beds.  Jonathon JordanLynn B Angelli Baruch, MSW, Theresia MajorsLCSWA [phone]

## 2017-03-04 NOTE — Progress Notes (Signed)
D: Pt presents with a flat affect and depressed mood. Pt rates anxiety 8/10. Depression 8/10. Pt denies suicidal thoughts. Pt reports withdrawal symptoms of sweats, chills, anxiety, discomfort and nausea. Pt given Librium for withdrawal symptoms. Pt verbalizes readiness to [facility]. Pt hopeful that she gets accepted tomorrow.  A: Medications reviewed with pt. Medications administered as ordered per MD. Verbal support provided. Pt encouraged to attend groups. 15 minute checks performed for safety.  R: Pt receptive to tx.

## 2017-03-04 NOTE — [facility] Suicide Risk Assessment (Signed)
[facility] INPATIENT:  Family/Significant Other Suicide Prevention Education  Suicide Prevention Education:  Patient Refusal for Family/Significant Other Suicide Prevention Education: The patient Shirley Murphy has refused to provide written consent for family/significant other to be provided Family/Significant Other Suicide Prevention Education during admission and/or prior to discharge.  Physician notified.  Jonathon JordanLynn B Carron Jaggi, MSW, LCSWA 03/04/2017, 3:51 PM

## 2017-03-04 NOTE — Discharge Summary (Signed)
Physician Discharge Summary Note  Patient:  Shirley Murphy is an 31 y.o., female MRN:  [id number] DOB:  November 22, 1985 Patient phone:  [phone] (home)  Patient address:   [address] [place] [address],  Total Time spent with patient: 30 minutes  Date of Admission:  03/02/2017 Date of Discharge: 03/05/2017  Reason for Admission:  increased alcohol and crack use along with patient taking 30 pills of Neurontin at one time and 10 Vistaril in order to prevent withdrawal  Principal Problem: Substance induced mood disorder [facility]) Discharge Diagnoses: Patient Active Problem List   Diagnosis Date Noted  . Substance induced mood disorder (HCC) [F19.94] 03/04/2017  . MDD (major depressive disorder), recurrent episode (HCC) [F33.9] 03/02/2017  . Suicide attempt (HCC) [T14.91XA] 10/15/2016  . HTN (hypertension), benign [I10] 10/15/2016  . Irregular heart beat [I49.9] 10/15/2016  . Suicidal ideation [R45.851] 10/15/2016  . Alcoholic intoxication without complication (HCC) [F10.920]     Past Psychiatric History: reports several prior psychiatric admissions, most recently was here at [facility] in June/2018. Reports history of several prior suicidal attempts starting in adolescence, denies history of self cutting, denies history of psychosis, endorses history of PTSD , which has gradually improved overtime.Reports history of prior Bipolar Disorder diagnosis and describes brief mood swings in the past, but states she feels her psychiatric symptoms are at least partially related to alcohol/substance abuse . Denies history of violence . When last discharged in June patient was discharged on Neurontin 300 mg po four times per day after meals and at bedtime for agiatation/ anxiety, Zoloft 50 mg po daily for depression, trazodone 100 mg po daily at bedtime for insomnia, Nicotine Patch for smoking cessation, Vistaril 25 mg po every 6 hours as needed for anxiety. Patient was on Seroquel 25 mg po  daily for insomnia  during her last hospital course however she endorsed that the medication made her angry and Seroquel discontinued with Trazodone restarted.   Past Medical History:  Past Medical History:  Diagnosis Date  . Chest pain   . Hepatitis C   . Hypertension   . Irregular heart beat     Past Surgical History:  Procedure Laterality Date  . c sections    . TUBAL LIGATION     Family History: History reviewed. No pertinent family history. Family Psychiatric  History: father and mother have history of alcohol and drug abuse, states she thinks father had bipolar disorder, father attempted suicide  Social History:  History  Alcohol Use No     History  Drug Use  . Types: Cocaine    Social History   Social History  . Marital status: Legally Separated    Spouse name: N/A  . Number of children: N/A  . Years of education: N/A   Social History Main Topics  . Smoking status: Current Every Day Smoker    Packs/day: 1.00    Types: Cigarettes  . Smokeless tobacco: Never Used  . Alcohol use No  . Drug use: Yes    Types: Cocaine  . Sexual activity: Yes    Birth control/ protection: None   Other Topics Concern  . None   Social History Narrative  . None    Hospital Course: Patient is a 31 year old female admitted as a walk in for increased alcohol and crack use along with patient taking 30 pills of Neurontin at one time and 10 Vistaril in order to prevent withdrawal. Patient also reported she was having thoughts of slitting her wrists as  she feels depressed due to her substance use, cannot contract for safety, is not sleeping well at night and is ashamed of herself with her lack of self-control and wishes she was dead  On NP evaluation, patient alert and oriented x4, calm and cooperative. Patient recently dischargeed from [facility] 01/[facility]. She endorsed that after her admission, she went to [facility] twice and could not be admitted to their program because her blood pressure  was elevated. Endorsed after being turned away twice, she relapsed and went out using crack cocaine and alcohol. Endorses excessive use of both on multiple occasions.  She endorsed she has been living with her sister who also has a history of substance abuse. She denies any SA or SI thoughts since her last admission although she admits to taking  30 Neurontin at one time and 10 Vistaril. She endorses that this was not a SA and reports that that she just misused the medication. She endorses current depressive symptoms and guilt from her drug use. Endorses that after discharge, she would like to go to [facility] instead of [facility] at this point. She denies AVH and does not appear to be internally preoccupied. She denies any paranoid ideations or delusions. Endorses current withdrawal symptoms of nausea. Her affect/mood seems anxious.   After the above admission assessment and during this hospital course, patients presenting symptoms were identified. Labs were reviewed and her UDS was positive for cocaine which patient admits to using prior to this admission.  Detoxication protocol administered as appropriate.  Patient was treated and discharged with the following medications; , Zoloft 50 mg po daily for depression, trazodone 100 mg po daily at bedtime for insomnia, Nicotine Patch for smoking cessation, Vistaril 25 mg po every 6 hours as needed for anxiety. She was previously on Neurontin 300 mg po four times per day after meals and at bedtime for agiatation/ anxiety however, this medication was not resumed due to her excessive use. Her blood pressure did fluctuated during her hospital course and she was started on lisinopril 10 mg po daily. Patient tolerated her treatment regimen without any adverse effects reported.  She remained compliant with therapeutic milieu and actively participated in group counseling sessions. AA/NA meetings were offered & held on the unit with patient active participation. While on the unit,  patient was able to verbalize learned coping skills for better management of depression and suicidal thoughts upon returning home.   During the course of her hospitalization, improvement was monitored by observation and Shirley Murphy's  daily  report of symptom reduction, presentation of good affect,and overall improvement in mood & behavior. Minnes case was presented during treatment team meeting and the team members were all in agreement that Shirley Murphy was both mentally & medically stable to be discharged to continue mental health care on an outpatient basis at [facility]. Shirley Murphy agreed to this discharge disposition.  She was provided with all the necessary information needed to make this appointment without problems.  Upon discharge, Shirley Murphy denied any SI/HI, AVH, delusional thoughts, or paranoia. She denied  any substance withdrawal symptoms. She was provided with prescriptions  of her [facility] discharge medications to be taken to her pharmacy as well as 14 days worth of samples. She left [facility] with all personal belongings in no apparent distress. Transportation per patients arrangement.  Physical Findings: AIMS: Facial and Oral Movements Muscles of Facial Expression: None, normal Lips and Perioral Area: None, normal Jaw: None, normal Tongue: None, normal,Extremity Movements Upper (arms, wrists, hands, fingers): None, normal Lower (  legs, knees, ankles, toes): None, normal, Trunk Movements Neck, shoulders, hips: None, normal, Overall Severity Severity of abnormal movements (highest score from questions above): None, normal Incapacitation due to abnormal movements: None, normal Patient's awareness of abnormal movements (rate only patient's report): No Awareness, Dental Status Current problems with teeth and/or dentures?: No Does patient usually wear dentures?: No  CIWA:  CIWA-Ar Total: 10 COWS:  COWS Total Score: 3  Musculoskeletal: Strength & Muscle Tone: within normal limits Gait & Station: normal Patient  leans: N/A  Psychiatric Specialty Exam: SEE SRA BY MD Physical Exam  Nursing note and vitals reviewed. Constitutional: She is oriented to person, place, and time.  Neurological: She is alert and oriented to person, place, and time.   Review of Systems  Psychiatric/Behavioral: Positive for depression (improved) and substance abuse (Hx of substnace abuse). Negative for hallucinations, memory loss and suicidal ideas. The patient is nervous/anxious (improved) and has insomnia (improved).   All other systems reviewed and are negative.   Blood pressure 137/72, pulse 70, temperature 100 F (37.8 C), resp. rate 16, height 5\' 1"  (1.549 m), weight 235 lb (106.6 kg), last menstrual period 02/02/2017.Body mass index is 44.4 kg/m.    Have you used any form of tobacco in the last 30 days? (Cigarettes, Smokeless Tobacco, Cigars, and/or Pipes): Yes  Has this patient used any form of tobacco in the last 30 days? (Cigarettes, Smokeless Tobacco, Cigars, and/or Pipes) Yes, Prescription provided for Nicoderm patch upon discharge.   Blood Alcohol level:  Lab Results  Component Value Date   ETH <5 03/02/2017   ETH 72 (H) 02/11/2017    Metabolic Disorder Labs:  No results found for: HGBA1C, MPG No results found for: PROLACTIN No results found for: CHOL, TRIG, HDL, CHOLHDL, VLDL, LDLCALC  See Psychiatric Specialty Exam and Suicide Risk Assessment completed by Attending Physician prior to discharge.  Discharge destination:  [facility]  Is patient on multiple antipsychotic therapies at discharge:  No   Has Patient had three or more failed trials of antipsychotic monotherapy by history:  No  Recommended Plan for Multiple Antipsychotic Therapies: NA   Allergies as of 03/04/2017      Reactions   Other    benzoyl peroxide      Medication List    STOP taking these medications   gabapentin 300 MG capsule Commonly known as:  NEURONTIN     TAKE these medications     Indication   hydrOXYzine 25 MG tablet Commonly known as:  ATARAX/VISTARIL Take 1 tablet (25 mg total) by mouth every 6 (six) hours as needed for anxiety. What changed:  additional instructions  Indication:  Feeling Anxious   lisinopril 10 MG tablet Commonly known as:  PRINIVIL,ZESTRIL Take 1 tablet (10 mg total) by mouth daily.  Indication:  elevated blood pressure   nicotine 21 mg/24hr patch Commonly known as:  NICODERM CQ - dosed in mg/24 hours Place 1 patch (21 mg total) onto the skin daily. What changed:  when to take this  Indication:  Nicotine Addiction   sertraline 50 MG tablet Commonly known as:  ZOLOFT Take 1 tablet (50 mg total) by mouth daily. What changed:  additional instructions  Indication:  Major Depressive Disorder   traZODone 50 MG tablet Commonly known as:  DESYREL Take 1 tablet (50 mg total) by mouth at bedtime and may repeat dose one time if needed. What changed:  medication strength  how much to take  when to take this  Indication:  insomnia  Follow-up recommendations:  Follow up with your outpatient provided for any medical issues. Activity & diet as recommended by your primary care provider.  Comments:  Patient is instructed prior to discharge to: Take all medications as prescribed by his/her mental healthcare provider. Report any adverse effects and or reactions from the medicines to his/her outpatient provider promptly. Patient has been instructed & cautioned: To not engage in alcohol and or illegal drug use while on prescription medicines. In the event of worsening symptoms, patient is instructed to call the crisis hotline, 911 and or go to the nearest ED for appropriate evaluation and treatment of symptoms. To follow-up with his/her primary care provider for your other medical issues, concerns and or health care needs.  Signed: Denzil Magnuson, NP 03/04/2017, 2:46 PM

## 2017-03-04 NOTE — Progress Notes (Signed)
[facility] Post 1:1 Observation Documentation  For the first (8) hours following discontinuation of 1:1 precautions, a progress note entry by nursing staff should be documented at least every 2 hours, reflecting the patient's behavior, condition, mood, and conversation.  Use the progress notes for additional entries.  Time 1:1 discontinued:  [id number]  Patient's Behavior:  Agitated, labile, anxious, intrusive and attention seeking.   Patient's Condition:  Pt verbally contracts for safety.  Patient's Conversation:  Rapid, loud and pressured speech.  Kaelee Pfeffer L 03/04/2017, 10:08 AM

## 2017-03-04 NOTE — [facility] Suicide Risk Assessment (Signed)
[facility] Discharge Suicide Risk Assessment   Principal Problem: Substance induced mood disorder [facility]) Discharge Diagnoses:  Patient Active Problem List   Diagnosis Date Noted  . MDD (major depressive disorder), recurrent episode (HCC) [F33.9] 03/02/2017    Priority: High  . Suicidal ideation [R45.851] 10/15/2016    Priority: High  . Alcoholic intoxication without complication (HCC) [F10.920]     Priority: High  . HTN (hypertension), benign [I10] 10/15/2016    Priority: Medium  . Substance induced mood disorder (HCC) [F19.94] 03/04/2017  . Suicide attempt (HCC) [T14.91XA] 10/15/2016  . Irregular heart beat [I49.9] 10/15/2016  Patient is a 31 year old female admitted for increase alcohol and crack use along with taking 30 pills of Neurontin at a time and 10 Vistaril in order to prevent withdrawal. Patient also stated that she had thoughts of slitting her wrist, was feeling depressed because of substance use, mom was unable to stop.  Patient this afternoon reports that she is doing better, is not having any big toe symptoms, really [facility], adds her blood pressure is better, states that she needs residential treatment to get better in regards to her drug use. Patient denies nay thoughts of self-harm, thoughts of harm to others, any hallucinations or paranoia  Total Time spent with patient: 30 minutes  Musculoskeletal: Strength & Muscle Tone: within normal limits Gait & Station: normal Patient leans: N/A  Psychiatric Specialty Exam: Review of Systems  Constitutional: Negative.  Negative for fever and malaise/fatigue.  HENT: Negative.  Negative for congestion, hearing loss and sore throat.   Eyes: Negative.  Negative for blurred vision, double vision, discharge and redness.  Respiratory: Negative.  Negative for cough, shortness of breath and wheezing.   Cardiovascular: Negative.  Negative for chest pain and palpitations.  Gastrointestinal: Negative.  Negative for  abdominal pain, diarrhea, heartburn, nausea and vomiting.  Musculoskeletal: Negative.  Negative for falls, joint pain and myalgias.  Neurological: Negative for dizziness, seizures, loss of consciousness, weakness and headaches.  Endo/Heme/Allergies: Negative.  Negative for environmental allergies.  Psychiatric/Behavioral: Positive for substance abuse. Negative for depression and suicidal ideas. The patient is not nervous/anxious and does not have insomnia.     Blood pressure 137/72, pulse 70, temperature 100 F (37.8 C), resp. rate 16, height 5\' 1"  (1.549 m), weight 106.6 kg (235 lb), last menstrual period 02/02/2017.Body mass index is 44.4 kg/m.  General Appearance: Casual  Eye Contact::  Good  Speech:  Clear and Coherent and Normal Rate409  Volume:  Normal  Mood:  Dysphoric  Affect:  Congruent and Full Range  Thought Process:  Coherent, Goal Directed and Descriptions of Associations: Intact  Orientation:  Full (Time, Place, and Person)  Thought Content:  WDL and Logical  Suicidal Thoughts:  No  Homicidal Thoughts:  No  Memory:  Immediate;   Fair Recent;   Fair Remote;   Fair  Judgement:  Impaired  Insight:  Present  Psychomotor Activity:  Normal  Concentration:  Fair  Recall:  [employer] of Knowledge:Fair  Language: Fair  Akathisia:  No  Handed:  Right  AIMS (if indicated):     Assets:  Desire for Improvement Physical Health  Sleep:  Number of Hours: 6.75  Cognition: WNL  ADL's:  Intact   Mental Status Per Nursing Assessment::   On Admission:  Suicidal ideation indicated by patient, Suicide plan, Self-harm behaviors, Self-harm thoughts  Demographic Factors:  Caucasian  Loss Factors: NA  Historical Factors: NA  Risk Reduction Factors:  Employed  Continued Clinical Symptoms:  Alcohol/Substance Abuse/Dependencies Previous Psychiatric Diagnoses and Treatments  Cognitive Features That Contribute To Risk:  None    Suicide Risk:  Minimal: No identifiable  suicidal ideation.  Patients presenting with no risk factors but with morbid ruminations; may be classified as minimal risk based on the severity of the depressive symptoms  Follow-up Information    Services, [facility] Follow up on 03/05/2017.   Why:  Please go as a walk-in to be assessed for residential treatment services. Arrive no later than 7:45AM. Please go with 2 weeks of medications and 2 weeks of prescriptions.  Contact information: [place] [address] [place] [address] [phone]        [facility] Follow up.   Specialty:  Addiction Medicine Why:  Social worker has made a referral on your behalf. If [facility] are [facility], please return home and continue to call [facility] for bed availability.  Contact information: [address] [place] [address] [phone]        [facility] Follow up.   Specialty:  Behavioral Health Why:  Once you complete residential treatment [facility] to be established for outpatient services. Walk-in hours are Mon-Fri 8am-3pm. Please arrive as early as possible to be sure that you are seen. Thank you. Contact information: [address] [place] [address] [phone]           Plan Of Care/Follow-up recommendations:  Activity:  As tolerated Diet:  Regular Other:  [facility] morning for assessment  Nelly RoutKUMAR,Kaitlen Redford, MD 03/04/2017, 3:59 PM

## 2017-03-05 NOTE — Progress Notes (Signed)
  [facility] Management Discharge Plan :  Will you be returning to the same living situation after discharge:  No.[facility]  At discharge, do you have transportation home?: Yes,  taxi voucher in chart Do you have the ability to pay for your medications: Yes,  mental health  Release of information consent forms completed and submitted to medical records.  Patient to Follow up at: Follow-up Information    [facility] Follow up on 03/05/2017.   Why:  Please go as a walk-in to be assessed for residential treatment services. Arrive no later than 7:45AM. Please go with 2 weeks of medications and 2 weeks of prescriptions.  Contact information: [facility] [place] [place] [address] [phone]        [facility] Follow up.   Specialty:  Addiction Medicine Why:  Social worker has made a referral on your behalf. [facility], please return home and continue to call [facility] for bed availability.  Contact information: [address] [place] [address] [phone]        [facility] Follow up.   Specialty:  Behavioral Health Why:  Once you complete residential treatment [facility] to be established for outpatient services. Walk-in hours are Mon-Fri 8am-3pm. Please arrive as early as possible to be sure that you are seen. Thank you. Contact information: [address] [place] [address] [phone]           Next level of care provider has access to [facility] Link:no  Safety Planning and Suicide Prevention discussed: Yes,  SPE completed with pt; pt declined to consent to family contact.   Have you used any form of tobacco in the last 30 days? (Cigarettes, Smokeless Tobacco, Cigars, and/or Pipes): Yes  Has patient been referred to the Quitline?: Patient refused referral  Patient has been referred for addiction treatment: Yes  Levern Pitter N Smart  LCSW 03/05/2017, 10:55 AM

## 2017-03-05 NOTE — Progress Notes (Signed)
Patient ID: Shirley Murphy, female   DOB: [birth year], 31 y.o.   MRN: [id number]  Pt discharged to lobby. Pt was stable and appreciative at that time. All papers and prescriptions were given and valuables returned. Verbal understanding expressed. Denies SI/HI and A/VH. Pt given opportunity to express concerns and ask questions.

## 2017-03-05 NOTE — Progress Notes (Signed)
Pt was laughing and talking. She was very engage with all the patients in the dayroom.

## 2017-03-05 NOTE — Progress Notes (Signed)
Pt in the dayroom very talking a lot and laughing. Pt was making gestures toward [place] but Hale BogusKeaton remain to talk to Brock Hallammy. Hale BogusKeaton was pleasant to [place] but did not engage in a convestation with Lubrizol CorporationMinnie. Pt mention several times she's bore she wants to do something. Pt start to read her magazine and proceed to work on one of the [employer] workbook.

## 2017-03-12 ENCOUNTER — Encounter (HOSPITAL_BASED_OUTPATIENT_CLINIC_OR_DEPARTMENT_OTHER): Payer: Self-pay | Admitting: *Deleted

## 2017-03-12 ENCOUNTER — Emergency Department (HOSPITAL_BASED_OUTPATIENT_CLINIC_OR_DEPARTMENT_OTHER)
Admission: EM | Admit: 2017-03-12 | Discharge: 2017-03-12 | Disposition: A | Discharge status: Home / Self Care | Payer: Medicaid - Out of State | Attending: Emergency Medicine | Admitting: Emergency Medicine

## 2017-03-12 DIAGNOSIS — B9689 Other specified bacterial agents as the cause of diseases classified elsewhere: Secondary | ICD-10-CM

## 2017-03-12 DIAGNOSIS — I10 Essential (primary) hypertension: Secondary | ICD-10-CM | POA: Insufficient documentation

## 2017-03-12 DIAGNOSIS — Z202 Contact with and (suspected) exposure to infections with a predominantly sexual mode of transmission: Secondary | ICD-10-CM | POA: Insufficient documentation

## 2017-03-12 DIAGNOSIS — F17210 Nicotine dependence, cigarettes, uncomplicated: Secondary | ICD-10-CM | POA: Insufficient documentation

## 2017-03-12 DIAGNOSIS — Z79899 Other long term (current) drug therapy: Secondary | ICD-10-CM | POA: Insufficient documentation

## 2017-03-12 DIAGNOSIS — N898 Other specified noninflammatory disorders of vagina: Secondary | ICD-10-CM

## 2017-03-12 DIAGNOSIS — N760 Acute vaginitis: Secondary | ICD-10-CM | POA: Insufficient documentation

## 2017-03-12 LAB — WET PREP, GENITAL
Sperm: NONE SEEN
TRICH WET PREP: NONE SEEN
Yeast Wet Prep HPF POC: NONE SEEN

## 2017-03-12 LAB — URINALYSIS, ROUTINE W REFLEX MICROSCOPIC
Bilirubin Urine: NEGATIVE
GLUCOSE, UA: NEGATIVE mg/dL
HGB URINE DIPSTICK: NEGATIVE
KETONES UR: NEGATIVE mg/dL
LEUKOCYTES UA: NEGATIVE
Nitrite: NEGATIVE
PROTEIN: NEGATIVE mg/dL
Specific Gravity, Urine: 1.014 (ref 1.005–1.030)
pH: 6.5 (ref 5.0–8.0)

## 2017-03-12 LAB — PREGNANCY, URINE: Preg Test, Ur: NEGATIVE

## 2017-03-12 MED ORDER — CEFTRIAXONE SODIUM 250 MG IJ SOLR
250.0000 mg | Freq: Once | INTRAMUSCULAR | Status: AC
  Administered 2017-03-12: 250 mg via INTRAMUSCULAR
  Filled 2017-03-12: qty 250

## 2017-03-12 MED ORDER — DOXYCYCLINE HYCLATE 100 MG PO TABS
100.0000 mg | ORAL_TABLET | Freq: Two times a day (BID) | ORAL | Status: DC
  Administered 2017-03-12: 100 mg via ORAL
  Filled 2017-03-12: qty 1

## 2017-03-12 MED ORDER — METRONIDAZOLE 500 MG PO TABS: 500.0000 mg | ORAL_TABLET | Freq: Two times a day (BID) | ORAL | 0 refills | Status: DC

## 2017-03-12 MED ORDER — AZITHROMYCIN 250 MG PO TABS
1000.0000 mg | ORAL_TABLET | Freq: Once | ORAL | Status: AC
  Administered 2017-03-12: 1000 mg via ORAL
  Filled 2017-03-12: qty 4

## 2017-03-12 MED ORDER — METRONIDAZOLE 500 MG PO TABS
500.0000 mg | ORAL_TABLET | Freq: Once | ORAL | Status: AC
  Administered 2017-03-12: 500 mg via ORAL
  Filled 2017-03-12: qty 1

## 2017-03-12 MED ORDER — DOXYCYCLINE HYCLATE 100 MG PO CAPS: 100.0000 mg | ORAL_CAPSULE | Freq: Two times a day (BID) | ORAL | 0 refills | Status: DC

## 2017-03-12 NOTE — ED Triage Notes (Signed)
States she has a vaginal odor and yellow discharge. Recent herpes and chlamydia.

## 2017-03-12 NOTE — ED Provider Notes (Signed)
[facility] Provider Note   CSN: [id number] Arrival date & time: 03/12/17  1110     History   Chief Complaint Chief Complaint  Patient presents with  . Exposure to STD    HPI Shirley Murphy is a 31 y.o. female.  HPI  31 year old female who presents to the emergency department today with pelvic cramping, malodorous vaginal discharge similar to previous episode of chlamydia. She states that she was treated for chlamydia a few weeks ago and her symptoms had improved and she is still sexually active with her boyfriend who she does not trust it is monogamous with her. Over the last couple days she's had worsening discharge with an odor.  Past Medical History:  Diagnosis Date  . Chest pain   . Hepatitis C   . Hypertension   . Irregular heart beat     Patient Active Problem List   Diagnosis Date Noted  . Substance induced mood disorder (HCC) 03/04/2017  . MDD (major depressive disorder), recurrent episode (HCC) 03/02/2017  . Suicide attempt (HCC) 10/15/2016  . HTN (hypertension), benign 10/15/2016  . Irregular heart beat 10/15/2016  . Suicidal ideation 10/15/2016  . Alcoholic intoxication without complication [facility](HCC)     Past Surgical History:  Procedure Laterality Date  . c sections    . TUBAL LIGATION      OB History    No data available       Home Medications    Prior to Admission medications   Medication Sig Start Date End Date Taking? Authorizing Provider  hydrOXYzine (ATARAX/VISTARIL) 25 MG tablet Take 1 tablet (25 mg total) by mouth every 6 (six) hours as needed for anxiety. 03/04/17  Yes Denzil Magnusonhomas, Lashunda, NP  lisinopril (PRINIVIL,ZESTRIL) 10 MG tablet Take 1 tablet (10 mg total) by mouth daily. 03/05/17  Yes Denzil Magnusonhomas, Lashunda, NP  nicotine (NICODERM CQ - DOSED IN MG/24 HOURS) 21 mg/24hr patch Place 1 patch (21 mg total) onto the skin daily. 03/04/17  Yes Denzil Magnusonhomas, Lashunda, NP  sertraline (ZOLOFT) 50 MG tablet Take 1 tablet (50 mg total) by mouth  daily. 03/05/17  Yes Denzil Magnusonhomas, Lashunda, NP  traZODone (DESYREL) 50 MG tablet Take 1 tablet (50 mg total) by mouth at bedtime and may repeat dose one time if needed. 03/04/17  Yes Denzil Magnusonhomas, Lashunda, NP  doxycycline (VIBRAMYCIN) 100 MG capsule Take 1 capsule (100 mg total) by mouth 2 (two) times daily. One po bid x 14 days 03/12/17   Keirra Zeimet, Barbara CowerJason, MD  metroNIDAZOLE (FLAGYL) 500 MG tablet Take 1 tablet (500 mg total) by mouth 2 (two) times daily. One po bid x 14 days 03/12/17   Madalee Altmann, Barbara CowerJason, MD    Family History No family history on file.  Social History Social History  Substance Use Topics  . Smoking status: Current Every Day Smoker    Packs/day: 1.00    Types: Cigarettes  . Smokeless tobacco: Never Used  . Alcohol use No     Allergies   Other   Review of Systems Review of Systems  All other systems reviewed and are negative.    Physical Exam Updated Vital Signs BP 124/80   Pulse 64   Temp 98 F (36.7 C) (Oral)   Resp 20   Ht 5' 1.5" (1.562 m)   Wt 104.3 kg (230 lb)   LMP 03/05/2017   SpO2 100%   BMI 42.75 kg/m   Physical Exam  Constitutional: She is oriented to person, place, and time. She appears well-developed and well-nourished.  HENT:  Head: Normocephalic and atraumatic.  Eyes: Conjunctivae and EOM are normal.  Neck: Normal range of motion.  Cardiovascular: Normal rate and regular rhythm.   Pulmonary/Chest: Effort normal and breath sounds normal. No stridor. No respiratory distress.  Abdominal: She exhibits no distension.  Genitourinary: There is tenderness on the right labia. There is no tenderness on the left labia.  Musculoskeletal: Normal range of motion. She exhibits no edema or deformity.  Neurological: She is alert and oriented to person, place, and time. No cranial nerve deficit. Coordination normal.  Nursing note and vitals reviewed.    ED Treatments / Results  Labs (all labs ordered are listed, but only abnormal results are displayed) Labs  Reviewed  WET PREP, GENITAL - Abnormal; Notable for the following:       Result Value   Clue Cells Wet Prep HPF POC PRESENT (*)    WBC, Wet Prep HPF POC MODERATE (*)    All other components within normal limits  URINALYSIS, ROUTINE W REFLEX MICROSCOPIC  PREGNANCY, URINE  RPR  HIV ANTIBODY (ROUTINE TESTING)  GC/CHLAMYDIA PROBE AMP ([place]) NOT [facility]    EKG  EKG Interpretation None       Radiology No results found.  Procedures Procedures (including critical care time)  Medications Ordered in ED Medications  cefTRIAXone (ROCEPHIN) injection 250 mg (250 mg Intramuscular Given 03/12/17 1231)  azithromycin (ZITHROMAX) tablet 1,000 mg (1,000 mg Oral Given 03/12/17 1230)  metroNIDAZOLE (FLAGYL) tablet 500 mg (500 mg Oral Given 03/12/17 1302)     Initial Impression / Assessment and Plan / ED Course  I have reviewed the triage vital signs and the nursing notes.  Pertinent labs & imaging results that were available during my care of the patient were reviewed by me and considered in my medical decision making (see chart for details).   31 year old female here with acute vaginosis. Also treated prophylactically for gonorrhea and chlamydia. Low suspicion for pelvic inflammatory disease however she did have pelvic pain and adnexal tenderness so we'll treat for that as well.   Final Clinical Impressions(s) / ED Diagnoses   Final diagnoses:  Vaginal discharge  Bacterial vaginosis    New Prescriptions Discharge Medication List as of 03/12/2017  1:06 PM    START taking these medications   Details  doxycycline (VIBRAMYCIN) 100 MG capsule Take 1 capsule (100 mg total) by mouth 2 (two) times daily. One po bid x 14 days, Starting Thu 03/12/2017, Print    metroNIDAZOLE (FLAGYL) 500 MG tablet Take 1 tablet (500 mg total) by mouth 2 (two) times daily. One po bid x 14 days, Starting Thu 03/12/2017, Print         Jerusalen Mateja, Barbara CowerJason, MD 03/12/17 [phone]

## 2017-03-13 LAB — RPR: RPR Ser Ql: NONREACTIVE

## 2017-03-13 LAB — GC/CHLAMYDIA PROBE AMP (~~LOC~~) NOT AT ARMC
Chlamydia: NEGATIVE
NEISSERIA GONORRHEA: NEGATIVE

## 2017-03-13 LAB — HIV ANTIBODY (ROUTINE TESTING W REFLEX): HIV Screen 4th Generation wRfx: NONREACTIVE

## 2017-03-23 ENCOUNTER — Encounter (HOSPITAL_COMMUNITY): Payer: Self-pay

## 2017-03-23 ENCOUNTER — Emergency Department (HOSPITAL_COMMUNITY)
Admission: EM | Admit: 2017-03-23 | Discharge: 2017-03-23 | Disposition: A | Discharge status: Home / Self Care | Payer: Medicaid - Out of State | Attending: Emergency Medicine | Admitting: Emergency Medicine

## 2017-03-23 DIAGNOSIS — F17210 Nicotine dependence, cigarettes, uncomplicated: Secondary | ICD-10-CM | POA: Insufficient documentation

## 2017-03-23 DIAGNOSIS — I10 Essential (primary) hypertension: Secondary | ICD-10-CM | POA: Insufficient documentation

## 2017-03-23 DIAGNOSIS — R55 Syncope and collapse: Secondary | ICD-10-CM

## 2017-03-23 DIAGNOSIS — J069 Acute upper respiratory infection, unspecified: Secondary | ICD-10-CM

## 2017-03-23 DIAGNOSIS — R112 Nausea with vomiting, unspecified: Secondary | ICD-10-CM

## 2017-03-23 DIAGNOSIS — Z79899 Other long term (current) drug therapy: Secondary | ICD-10-CM | POA: Insufficient documentation

## 2017-03-23 DIAGNOSIS — B1710 Acute hepatitis C without hepatic coma: Secondary | ICD-10-CM | POA: Insufficient documentation

## 2017-03-23 LAB — BASIC METABOLIC PANEL
ANION GAP: 7 (ref 5–15)
BUN: 15 mg/dL (ref 6–20)
CALCIUM: 9.4 mg/dL (ref 8.9–10.3)
CHLORIDE: 104 mmol/L (ref 101–111)
CO2: 26 mmol/L (ref 22–32)
CREATININE: 0.93 mg/dL (ref 0.44–1.00)
GFR calc non Af Amer: >60 mL/min (ref >60)
Glucose, Bld: 98 mg/dL (ref 65–99)
Potassium: 4 mmol/L (ref 3.5–5.1)
SODIUM: 137 mmol/L (ref 135–145)

## 2017-03-23 LAB — CBC
HCT: 38.3 % (ref 36.0–46.0)
Hemoglobin: 12.3 g/dL (ref 12.0–15.0)
MCH: 26.5 pg (ref 26.0–34.0)
MCHC: 32.1 g/dL (ref 30.0–36.0)
MCV: 82.4 fL (ref 78.0–100.0)
PLATELETS: 238 10*3/uL (ref 150–400)
RBC: 4.65 MIL/uL (ref 3.87–5.11)
RDW: 16.6 % — ABNORMAL HIGH (ref 11.5–15.5)
WBC: 7.8 10*3/uL (ref 4.0–10.5)

## 2017-03-23 LAB — I-STAT BETA HCG BLOOD, ED (MC, WL, AP ONLY)

## 2017-03-23 LAB — RAPID STREP SCREEN (MED CTR MEBANE ONLY): STREPTOCOCCUS, GROUP A SCREEN (DIRECT): NEGATIVE

## 2017-03-23 MED ORDER — ONDANSETRON 4 MG PO TBDP: 4.0000 mg | ORAL_TABLET | Freq: Three times a day (TID) | ORAL | 0 refills | Status: DC | PRN

## 2017-03-23 MED ORDER — ONDANSETRON 4 MG PO TBDP
8.0000 mg | ORAL_TABLET | Freq: Once | ORAL | Status: AC
  Administered 2017-03-23: 8 mg via ORAL
  Filled 2017-03-23: qty 2

## 2017-03-23 NOTE — ED Provider Notes (Signed)
[facility] Provider Note   CSN: [id number] Arrival date & time: 03/23/17  1002     History   Chief Complaint Chief Complaint  Patient presents with  . Loss of Consciousness    HPI Shirley Murphy is a 31 y.o. female.  The history is provided by the patient. No language interpreter was used.  Loss of Consciousness      Shirley Murphy is a 31 y.o. female who presents to the Emergency Department complaining of syncope.  She works in [employer] and was working outside today carrying a pile of shingles when she became lightheaded and fell to the ground. She denies any injuries. She also endorses feeling poorly today with emesis 4 as well as sore throat and mild cough. She had a slight headache yesterday but now resolved. She is currently taking doxycycline and Flagyl for a pelvic infection. She states that her discharge has improved. She also reports recently relapsing on alcohol and had a beer yesterday. She denies any fevers, chest pain, shortness of breath.  Past Medical History:  Diagnosis Date  . Chest pain   . Hepatitis C   . Hypertension   . Irregular heart beat     Patient Active Problem List   Diagnosis Date Noted  . Substance induced mood disorder (HCC) 03/04/2017  . MDD (major depressive disorder), recurrent episode (HCC) 03/02/2017  . Suicide attempt (HCC) 10/15/2016  . HTN (hypertension), benign 10/15/2016  . Irregular heart beat 10/15/2016  . Suicidal ideation 10/15/2016  . Alcoholic intoxication without complication [facility](HCC)     Past Surgical History:  Procedure Laterality Date  . c sections    . TUBAL LIGATION      OB History    No data available       Home Medications    Prior to Admission medications   Medication Sig Start Date End Date Taking? Authorizing Provider  doxycycline (VIBRAMYCIN) 100 MG capsule Take 1 capsule (100 mg total) by mouth 2 (two) times daily. One po bid x 14 days 03/12/17   Mesner, Barbara CowerJason, MD  hydrOXYzine  (ATARAX/VISTARIL) 25 MG tablet Take 1 tablet (25 mg total) by mouth every 6 (six) hours as needed for anxiety. 03/04/17   Denzil Magnusonhomas, Lashunda, NP  lisinopril (PRINIVIL,ZESTRIL) 10 MG tablet Take 1 tablet (10 mg total) by mouth daily. 03/05/17   Denzil Magnusonhomas, Lashunda, NP  metroNIDAZOLE (FLAGYL) 500 MG tablet Take 1 tablet (500 mg total) by mouth 2 (two) times daily. One po bid x 14 days 03/12/17   Mesner, Barbara CowerJason, MD  nicotine (NICODERM CQ - DOSED IN MG/24 HOURS) 21 mg/24hr patch Place 1 patch (21 mg total) onto the skin daily. 03/04/17   Denzil Magnusonhomas, Lashunda, NP  sertraline (ZOLOFT) 50 MG tablet Take 1 tablet (50 mg total) by mouth daily. 03/05/17   Denzil Magnusonhomas, Lashunda, NP  traZODone (DESYREL) 50 MG tablet Take 1 tablet (50 mg total) by mouth at bedtime and may repeat dose one time if needed. 03/04/17   Denzil Magnusonhomas, Lashunda, NP    Family History No family history on file.  Social History Social History  Substance Use Topics  . Smoking status: Current Every Day Smoker    Packs/day: 1.00    Types: Cigarettes  . Smokeless tobacco: Never Used  . Alcohol use No     Allergies   Other   Review of Systems Review of Systems  Cardiovascular: Positive for syncope.  All other systems reviewed and are negative.    Physical Exam Updated Vital Signs BP 125/74 (  BP Location: Left Arm)   Pulse 73   Temp 97.9 F (36.6 C) (Oral)   Resp 17   LMP 03/05/2017   SpO2 97%   Physical Exam  Constitutional: She is oriented to person, place, and time. She appears well-developed and well-nourished.  HENT:  Head: Normocephalic and atraumatic.  Mild erythema in the posterior oropharynx  Eyes: Pupils are equal, round, and reactive to light. EOM are normal.  Neck: Neck supple.  Cardiovascular: Normal rate and regular rhythm.   No murmur heard. Pulmonary/Chest: Effort normal and breath sounds normal. No stridor. No respiratory distress.  Abdominal: Soft. There is no tenderness. There is no rebound and no guarding.    Musculoskeletal: She exhibits no edema or tenderness.  Neurological: She is alert and oriented to person, place, and time. No cranial nerve deficit.  Skin: Skin is warm and dry.  Psychiatric: She has a normal mood and affect. Her behavior is normal.  Nursing note and vitals reviewed.    ED Treatments / Results  Labs (all labs ordered are listed, but only abnormal results are displayed) Labs Reviewed  CBC - Abnormal; Notable for the following:       Result Value   RDW 16.6 (*)    All other components within normal limits  RAPID STREP SCREEN (NOT AT [facility])  CULTURE, GROUP A STREP [facility])  BASIC METABOLIC PANEL  I-STAT BETA HCG BLOOD, ED ([facility], [facility], [facility] ONLY)    EKG  EKG Interpretation  Date/Time:  Monday March 23 2017 12:32:43 EDT Ventricular Rate:  79 PR Interval:  130 QRS Duration: 104 QT Interval:  408 QTC Calculation: 467 R Axis:   29 Text Interpretation:  Normal sinus rhythm Normal ECG Confirmed by Tilden Fossa [phone]) on 03/23/2017 1:55:37 PM       Radiology No results found.  Procedures Procedures (including critical care time)  Medications Ordered in ED Medications  ondansetron (ZOFRAN-ODT) disintegrating tablet 8 mg (not administered)     Initial Impression / Assessment and Plan / ED Course  I have reviewed the triage vital signs and the nursing notes.  Pertinent labs & imaging results that were available during my care of the patient were reviewed by me and considered in my medical decision making (see chart for details).     Patient here following a syncopal event, has associated recent vomiting, sore throat, cough. In terms of vomiting, patient on Flagyl and recently had a beer-likely side effects related to interaction with her Flagyl. She states that she will not drink anymore alcohol. In terms of sore throat, she has mild erythema with no evidence of PTA or RPA. Discussed with patient likely viral illness with dehydration.  Current presentation is  not consistent with PE, cardiogenic syncope, sepsis.  Counseled patient on home care, outpatient follow up and return precautions.   Final Clinical Impressions(s) / ED Diagnoses   Final diagnoses:  Syncope and collapse  Viral URI  Non-intractable vomiting with nausea, unspecified vomiting type    New Prescriptions New Prescriptions   No medications on file     Tilden Fossa, MD 03/23/17 1527

## 2017-03-23 NOTE — ED Notes (Signed)
Called pt no answer °

## 2017-03-23 NOTE — ED Notes (Signed)
Called for triage no response 

## 2017-03-23 NOTE — Discharge Instructions (Signed)
Do not drink alcohol while taking flagyl (metronidazole) - it will cause severe nausea and vomiting.  Get rechecked immediately if you develop any new or worrisome symptoms.

## 2017-03-23 NOTE — ED Triage Notes (Signed)
Pt presents for evaluation of sore throat and syncopal event today at work. 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

## 2017-03-23 NOTE — ED Notes (Signed)
Patient given discharge instructions and verbalized understanding.  Patient stable to discharge at this time.  Patient is alert and oriented to baseline.  No distressed noted at this time.  All belongings taken with the patient at discharge.   

## 2017-03-25 LAB — CULTURE, GROUP A STREP (THRC)

## 2017-03-27 ENCOUNTER — Encounter (HOSPITAL_COMMUNITY): Payer: Self-pay | Admitting: Emergency Medicine

## 2017-03-27 DIAGNOSIS — F17210 Nicotine dependence, cigarettes, uncomplicated: Secondary | ICD-10-CM | POA: Insufficient documentation

## 2017-03-27 DIAGNOSIS — R05 Cough: Secondary | ICD-10-CM | POA: Insufficient documentation

## 2017-03-27 DIAGNOSIS — R51 Headache: Secondary | ICD-10-CM | POA: Insufficient documentation

## 2017-03-27 DIAGNOSIS — I10 Essential (primary) hypertension: Secondary | ICD-10-CM | POA: Insufficient documentation

## 2017-03-27 DIAGNOSIS — R509 Fever, unspecified: Secondary | ICD-10-CM | POA: Insufficient documentation

## 2017-03-27 DIAGNOSIS — J029 Acute pharyngitis, unspecified: Secondary | ICD-10-CM | POA: Insufficient documentation

## 2017-03-27 DIAGNOSIS — Z79899 Other long term (current) drug therapy: Secondary | ICD-10-CM | POA: Insufficient documentation

## 2017-03-27 DIAGNOSIS — J0100 Acute maxillary sinusitis, unspecified: Secondary | ICD-10-CM | POA: Insufficient documentation

## 2017-03-27 MED ORDER — ALBUTEROL SULFATE (2.5 MG/3ML) 0.083% IN NEBU
5.0000 mg | INHALATION_SOLUTION | Freq: Once | RESPIRATORY_TRACT | Status: AC
  Administered 2017-03-28: 5 mg via RESPIRATORY_TRACT
  Filled 2017-03-27: qty 6

## 2017-03-27 NOTE — ED Triage Notes (Signed)
Pt from home with c/o cough and congestion that began prior to her last visit on 8/6. Pt states her symptoms have gotten worse. Pt states she has reproducible chest wall pain when she coughs that comes and goes. Pt states. Pt was diagnosed with a URI her last visit. Pt reports clear and white sputum with productive cough.

## 2017-03-28 ENCOUNTER — Emergency Department (HOSPITAL_COMMUNITY): Payer: Medicaid - Out of State

## 2017-03-28 ENCOUNTER — Emergency Department (HOSPITAL_COMMUNITY)
Admission: EM | Admit: 2017-03-28 | Discharge: 2017-03-28 | Disposition: A | Discharge status: Home / Self Care | Payer: Medicaid - Out of State | Attending: Emergency Medicine | Admitting: Emergency Medicine

## 2017-03-28 DIAGNOSIS — J0100 Acute maxillary sinusitis, unspecified: Secondary | ICD-10-CM

## 2017-03-28 MED ORDER — AZITHROMYCIN 250 MG PO TABS
500.0000 mg | ORAL_TABLET | Freq: Once | ORAL | Status: AC
  Administered 2017-03-28: 500 mg via ORAL
  Filled 2017-03-28: qty 2

## 2017-03-28 MED ORDER — AZITHROMYCIN 250 MG PO TABS: 250.0000 mg | ORAL_TABLET | Freq: Every day | ORAL | 0 refills | Status: DC

## 2017-03-28 MED ORDER — DEXAMETHASONE 4 MG PO TABS
10.0000 mg | ORAL_TABLET | Freq: Once | ORAL | Status: AC
  Administered 2017-03-28: 05:00:00 10 mg via ORAL
  Filled 2017-03-28: qty 2

## 2017-03-28 NOTE — ED Provider Notes (Signed)
[facility]-EMERGENCY DEPT Provider Note   CSN: [id number] Arrival date & time: 03/27/17  2234     History   Chief Complaint Chief Complaint  Patient presents with  . Nasal Congestion  . Cough    HPI Shirley Murphy is a 31 y.o. female.  Patient presents with complaint of URI symptoms of rhinorrhea, sore throat, cough, general malaise, headache. She reports a fever at home with Tmax of 101. Symptoms have been going on for about 1 week. No vomiting or diarrhea. No known sick contacts.    The history is provided by the patient. No language interpreter was used.  Cough  Associated symptoms include chills, headaches, rhinorrhea, sore throat and myalgias. Pertinent negatives include no chest pain.    Past Medical History:  Diagnosis Date  . Chest pain   . Hepatitis C   . Hypertension   . Irregular heart beat     Patient Active Problem List   Diagnosis Date Noted  . Substance induced mood disorder (HCC) 03/04/2017  . MDD (major depressive disorder), recurrent episode (HCC) 03/02/2017  . Suicide attempt (HCC) 10/15/2016  . HTN (hypertension), benign 10/15/2016  . Irregular heart beat 10/15/2016  . Suicidal ideation 10/15/2016  . Alcoholic intoxication without complication [facility](HCC)     Past Surgical History:  Procedure Laterality Date  . c sections    . TUBAL LIGATION      OB History    No data available       Home Medications    Prior to Admission medications   Medication Sig Start Date End Date Taking? Authorizing Provider  sertraline (ZOLOFT) 50 MG tablet Take 1 tablet (50 mg total) by mouth daily. 03/05/17  Yes Denzil Magnusonhomas, Lashunda, NP  traZODone (DESYREL) 50 MG tablet Take 1 tablet (50 mg total) by mouth at bedtime and may repeat dose one time if needed. 03/04/17  Yes Denzil Magnusonhomas, Lashunda, NP  doxycycline (VIBRAMYCIN) 100 MG capsule Take 1 capsule (100 mg total) by mouth 2 (two) times daily. One po bid x 14 days Patient not taking: Reported on 03/28/2017 03/12/17   Mesner,  Barbara CowerJason, MD  hydrOXYzine (ATARAX/VISTARIL) 25 MG tablet Take 1 tablet (25 mg total) by mouth every 6 (six) hours as needed for anxiety. Patient not taking: Reported on 03/28/2017 03/04/17   Denzil Magnusonhomas, Lashunda, NP  lisinopril (PRINIVIL,ZESTRIL) 10 MG tablet Take 1 tablet (10 mg total) by mouth daily. Patient not taking: Reported on 03/28/2017 03/05/17   Denzil Magnusonhomas, Lashunda, NP  metroNIDAZOLE (FLAGYL) 500 MG tablet Take 1 tablet (500 mg total) by mouth 2 (two) times daily. One po bid x 14 days Patient not taking: Reported on 03/28/2017 03/12/17   Mesner, Barbara CowerJason, MD  nicotine (NICODERM CQ - DOSED IN MG/24 HOURS) 21 mg/24hr patch Place 1 patch (21 mg total) onto the skin daily. Patient not taking: Reported on 03/28/2017 03/04/17   Denzil Magnusonhomas, Lashunda, NP  ondansetron (ZOFRAN ODT) 4 MG disintegrating tablet Take 1 tablet (4 mg total) by mouth every 8 (eight) hours as needed for nausea or vomiting. 03/23/17   Tilden Fossaees, Elizabeth, MD    Family History No family history on file.  Social History Social History  Substance Use Topics  . Smoking status: Current Every Day Smoker    Packs/day: 1.00    Types: Cigarettes  . Smokeless tobacco: Never Used  . Alcohol use No     Allergies   Other   Review of Systems Review of Systems  Constitutional: Positive for chills and fever.  HENT: Positive for  congestion, rhinorrhea and sore throat.   Respiratory: Positive for cough.   Cardiovascular: Negative.  Negative for chest pain.  Gastrointestinal: Negative.  Negative for abdominal pain, nausea and vomiting.  Genitourinary: Negative.   Musculoskeletal: Positive for myalgias.  Skin: Negative.  Negative for rash.  Neurological: Positive for headaches.     Physical Exam Updated Vital Signs BP 114/61 (BP Location: Left Arm)   Pulse 94   Temp 98.9 F (37.2 C) (Oral)   Resp 20   Wt 110.8 kg (244 lb 4.8 oz)   LMP 03/05/2017   SpO2 97%   BMI 45.41 kg/m   Physical Exam  Constitutional: She is oriented to person,  place, and time. She appears well-developed and well-nourished. No distress.  HENT:  Head: Normocephalic.  Nose: Mucosal edema and rhinorrhea present. Right sinus exhibits maxillary sinus tenderness. Left sinus exhibits maxillary sinus tenderness.  Mouth/Throat: Mucous membranes are normal. Posterior oropharyngeal erythema present. No posterior oropharyngeal edema.  Neck: Normal range of motion. Neck supple.  Cardiovascular: Normal rate and regular rhythm.   No murmur heard. Pulmonary/Chest: Effort normal and breath sounds normal. She has no wheezes. She has no rales.  Abdominal: Soft. Bowel sounds are normal. There is no tenderness. There is no rebound and no guarding.  Musculoskeletal: Normal range of motion. She exhibits no edema.  Neurological: She is alert and oriented to person, place, and time.  Skin: Skin is warm and dry. No rash noted.  Psychiatric: She has a normal mood and affect.     ED Treatments / Results  Labs (all labs ordered are listed, but only abnormal results are displayed) Labs Reviewed - No data to display  EKG  EKG Interpretation None       Radiology Dg Chest 2 View  Result Date: 03/28/2017 CLINICAL DATA:  Productive cough x2 weeks EXAM: CHEST  2 VIEW COMPARISON:  None. FINDINGS: The heart size and mediastinal contours are within normal limits. Both lungs are clear. The visualized skeletal structures are unremarkable. IMPRESSION: No active cardiopulmonary disease. Electronically Signed   By: Tollie Eth M.D.   On: 03/28/2017 01:07    Procedures Procedures (including critical care time)  Medications Ordered in ED Medications  albuterol (PROVENTIL) (2.5 MG/3ML) 0.083% nebulizer solution 5 mg (5 mg Nebulization Given 03/28/17 0001)     Initial Impression / Assessment and Plan / ED Course  I have reviewed the triage vital signs and the nursing notes.  Pertinent labs & imaging results that were available during my care of the patient were reviewed by  me and considered in my medical decision making (see chart for details).     Patient with symptoms or URI found to have sinus tenderness, nasal mucosal swelling, frontal headache, consistent with sinusitis. Will cover with abx, give Decadron x 1. REcommend OTC supportive care.   Final Clinical Impressions(s) / ED Diagnoses   Final diagnoses:  None   1. Sinusitis  New Prescriptions New Prescriptions   No medications on file     Elpidio Anis, Cordelia Poche 03/28/17 1191    Dione Booze, MD 03/28/17 [phone]

## 2017-04-08 ENCOUNTER — Emergency Department (HOSPITAL_COMMUNITY): Payer: Medicaid - Out of State

## 2017-04-08 ENCOUNTER — Encounter (HOSPITAL_COMMUNITY): Payer: Self-pay | Admitting: Emergency Medicine

## 2017-04-08 ENCOUNTER — Emergency Department (HOSPITAL_COMMUNITY)
Admission: EM | Admit: 2017-04-08 | Discharge: 2017-04-08 | Disposition: A | Discharge status: Home / Self Care | Payer: Medicaid - Out of State | Attending: Emergency Medicine | Admitting: Emergency Medicine

## 2017-04-08 DIAGNOSIS — G8929 Other chronic pain: Secondary | ICD-10-CM | POA: Insufficient documentation

## 2017-04-08 DIAGNOSIS — M25561 Pain in right knee: Secondary | ICD-10-CM | POA: Insufficient documentation

## 2017-04-08 MED ORDER — HYDROCODONE-ACETAMINOPHEN 5-325 MG PO TABS
1.0000 | ORAL_TABLET | Freq: Once | ORAL | Status: AC
  Administered 2017-04-08: 1 via ORAL
  Filled 2017-04-08: qty 1

## 2017-04-08 MED ORDER — IBUPROFEN 800 MG PO TABS: 800.0000 mg | ORAL_TABLET | Freq: Three times a day (TID) | ORAL | 0 refills | Status: DC

## 2017-04-08 MED ORDER — CYCLOBENZAPRINE HCL 10 MG PO TABS: 10.0000 mg | ORAL_TABLET | Freq: Two times a day (BID) | ORAL | 0 refills | Status: DC | PRN

## 2017-04-08 NOTE — ED Notes (Signed)
Ortho is aware of patients orders.  

## 2017-04-08 NOTE — ED Provider Notes (Signed)
[facility]ENCY DEPT Provider Note   CSN: [id number] Arrival date & time: 04/08/17  1741     History   Chief Complaint Chief Complaint  Patient presents with  . Knee Pain    HPI Shirley Murphy is a 31 y.o. female.  HPI   31 year old morbidly obese female presenting complaining of right knee pain. Patient states she has had persistent pain to her right knee since 2005. States she had an anterior cruciate ligament tear in the past and a torn meniscus that without any specific medical management for it. She endorse worsening right knee pain at 5 AM this morning that has been persistence. Anus 10 out of 10, worse with bending her knee. Pain not adequately treated with Advil and ice. States that she felt a pop earlier today. No report of hip or ankle pain. No numbness or weakness.  No fever or chills, no rash. She does not have an orthopedist. 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     Past Medical History:  Diagnosis Date  . Chest pain   . Hepatitis C   . Hypertension   . Irregular heart beat     Patient Active Problem List   Diagnosis Date Noted  . Substance induced mood disorder (HCC) 03/04/2017  . MDD (major depressive disorder), recurrent episode (HCC) 03/02/2017  . Suicide attempt (HCC) 10/15/2016  . HTN (hypertension), benign 10/15/2016  . Irregular heart beat 10/15/2016  . Suicidal ideation 10/15/2016  . Alcoholic intoxication without complication [facility])     Past Surgical History:  Procedure Laterality Date  . c sections    . TUBAL LIGATION      OB History    No data available       Home Medications    Prior to Admission medications   Medication Sig Start Date End Date Taking? Authorizing Provider  azithromycin (ZITHROMAX Z-PAK) 250 MG tablet Take 1 tablet (250 mg total) by mouth daily. 03/28/17   Elpidio Anis, PA-C  doxycycline (VIBRAMYCIN) 100 MG capsule Take 1 capsule (100 mg total) by mouth 2 (two) times daily. One po bid x 14  days Patient not taking: Reported on 03/28/2017 03/12/17   Mesner, Barbara Cower, MD  hydrOXYzine (ATARAX/VISTARIL) 25 MG tablet Take 1 tablet (25 mg total) by mouth every 6 (six) hours as needed for anxiety. Patient not taking: Reported on 03/28/2017 03/04/17   Denzil Magnuson, NP  lisinopril (PRINIVIL,ZESTRIL) 10 MG tablet Take 1 tablet (10 mg total) by mouth daily. Patient not taking: Reported on 03/28/2017 03/05/17   Denzil Magnuson, NP  metroNIDAZOLE (FLAGYL) 500 MG tablet Take 1 tablet (500 mg total) by mouth 2 (two) times daily. One po bid x 14 days Patient not taking: Reported on 03/28/2017 03/12/17   Mesner, Barbara Cower, MD  nicotine (NICODERM CQ - DOSED IN MG/24 HOURS) 21 mg/24hr patch Place 1 patch (21 mg total) onto the skin daily. Patient not taking: Reported on 03/28/2017 03/04/17   Denzil Magnuson, NP  ondansetron (ZOFRAN ODT) 4 MG disintegrating tablet Take 1 tablet (4 mg total) by mouth every 8 (eight) hours as needed for nausea or vomiting. 03/23/17   Tilden Fossa, MD  sertraline (ZOLOFT) 50 MG tablet Take 1 tablet (50 mg total) by mouth daily. 03/05/17   Denzil Magnuson, NP  traZODone (DESYREL) 50 MG tablet Take 1 tablet (50 mg total) by mouth at bedtime and may repeat dose one time if needed. 03/04/17   Denzil Magnuson, NP    Family History No family  history on file.  Social History Social History  Substance Use Topics  . Smoking status: Current Every Day Smoker    Packs/day: 1.00    Types: Cigarettes  . Smokeless tobacco: Never Used  . Alcohol use No     Allergies   Other   Review of Systems Review of Systems  Constitutional: Negative for fever.  Musculoskeletal: Positive for arthralgias.  Skin: Negative for rash and wound.  Neurological: Negative for numbness.     Physical Exam Updated Vital Signs BP (!) 133/94 (BP Location: Left Arm)   Pulse (!) 101   Temp 98.5 F (36.9 C) (Oral)   Resp 18   SpO2 99%   Physical Exam  Constitutional: She appears well-developed  and well-nourished. No distress.  Obese female laying in bed in no acute discomfort.   HENT:  Head: Atraumatic.  Eyes: Conjunctivae are normal.  Neck: Neck supple.  Musculoskeletal: She exhibits tenderness (R knee: diffuse tenderness to knee without swelling, crepitus or erythema.  decrease knee flexion 2/2 pain.  no joint laxity noted.  ).  R hip and R ankle are non tender.   Neurological: She is alert.  Skin: No rash noted.  Psychiatric: She has a normal mood and affect.  Nursing note and vitals reviewed.    ED Treatments / Results  Labs (all labs ordered are listed, but only abnormal results are displayed) Labs Reviewed - No data to display  EKG  EKG Interpretation None       Radiology Dg Knee Complete 4 Views Right  Result Date: 04/08/2017 CLINICAL DATA:  31 y/o F; right medial knee pain. Heard a pop walking today. EXAM: RIGHT KNEE - COMPLETE 4+ VIEW COMPARISON:  None. FINDINGS: No evidence of fracture, dislocation, or joint effusion. No evidence of arthropathy or other focal bone abnormality. Soft tissues are unremarkable. IMPRESSION: Negative. Electronically Signed   By: Mitzi Hansen M.D.   On: 04/08/2017 19:09    Procedures Procedures (including critical care time)  Medications Ordered in ED Medications - No data to display   Initial Impression / Assessment and Plan / ED Course  I have reviewed the triage vital signs and the nursing notes.  Pertinent labs & imaging results that were available during my care of the patient were reviewed by me and considered in my medical decision making (see chart for details).     BP 110/72 (BP Location: Left Arm)   Pulse 90   Temp 98.5 F (36.9 C) (Oral)   Resp 20   LMP 04/08/2017   SpO2 99%    Final Clinical Impressions(s) / ED Diagnoses   Final diagnoses:  Acute pain of right knee    New Prescriptions New Prescriptions   CYCLOBENZAPRINE (FLEXERIL) 10 MG TABLET    Take 1 tablet (10 mg total) by  mouth 2 (two) times daily as needed for muscle spasms.   IBUPROFEN (ADVIL,MOTRIN) 800 MG TABLET    Take 1 tablet (800 mg total) by mouth 3 (three) times daily.   6:48 PM Acute on chronic pain to R knee, felt a pop.  Can't ambulate and can't flex her knee.  Will obtain xray of R knee.  Pain medication given.   7:13 PM Xray of R knee showing no acute finding. Pt request for MRI.  I recommend pt to f/u with orthopedist for further evaluation and management of her recurrent knee pain.  Will wrap knee in ace wrap, crutches provided for support.  Return precaution given.  Pt is  NVI.  Doubt DVT.   Fayrene Helper, PA-C 04/08/17 1918    Nira Conn, MD 04/08/17 [phone]

## 2017-04-08 NOTE — Discharge Instructions (Signed)
Please call and follow up closely with orthopedist for further evaluation of your knee pain.  Use crutches as needed.

## 2017-04-08 NOTE — ED Triage Notes (Signed)
Patient here with complaints of right knee pain no trauma. Hx of torn mensicus.

## 2017-04-08 NOTE — ED Notes (Signed)
Bed: WTR6 Expected date:  Expected time:  Means of arrival:  Comments:  EMS right knee pain

## 2017-06-11 ENCOUNTER — Emergency Department (HOSPITAL_COMMUNITY)
Admission: EM | Admit: 2017-06-11 | Discharge: 2017-06-12 | Disposition: A | Discharge status: Home / Self Care | Payer: Self-pay | Attending: Emergency Medicine | Admitting: Emergency Medicine

## 2017-06-11 ENCOUNTER — Encounter (HOSPITAL_COMMUNITY): Payer: Self-pay

## 2017-06-11 DIAGNOSIS — Z79899 Other long term (current) drug therapy: Secondary | ICD-10-CM | POA: Insufficient documentation

## 2017-06-11 DIAGNOSIS — F10920 Alcohol use, unspecified with intoxication, uncomplicated: Secondary | ICD-10-CM | POA: Diagnosis present

## 2017-06-11 DIAGNOSIS — F1910 Other psychoactive substance abuse, uncomplicated: Secondary | ICD-10-CM | POA: Insufficient documentation

## 2017-06-11 DIAGNOSIS — F1410 Cocaine abuse, uncomplicated: Secondary | ICD-10-CM | POA: Insufficient documentation

## 2017-06-11 DIAGNOSIS — F1420 Cocaine dependence, uncomplicated: Secondary | ICD-10-CM | POA: Diagnosis present

## 2017-06-11 DIAGNOSIS — F1014 Alcohol abuse with alcohol-induced mood disorder: Secondary | ICD-10-CM | POA: Diagnosis present

## 2017-06-11 DIAGNOSIS — R45851 Suicidal ideations: Secondary | ICD-10-CM | POA: Insufficient documentation

## 2017-06-11 DIAGNOSIS — Z23 Encounter for immunization: Secondary | ICD-10-CM | POA: Insufficient documentation

## 2017-06-11 DIAGNOSIS — F17210 Nicotine dependence, cigarettes, uncomplicated: Secondary | ICD-10-CM | POA: Insufficient documentation

## 2017-06-11 DIAGNOSIS — L03115 Cellulitis of right lower limb: Secondary | ICD-10-CM | POA: Insufficient documentation

## 2017-06-11 NOTE — ED Notes (Signed)
Bed: WA27 Expected date:  Expected time:  Means of arrival:  Comments: EMS 31 yo female from home/SI

## 2017-06-11 NOTE — ED Triage Notes (Addendum)
Pt having suicidal ideation after crack,beer, and xanax today no specific plan denies overdosing self today. Pt also c/o right foot pain after stepping on rusty nail and is not sure when her last TDAP was done. Vitals per EMS BP: 160/120 HR: 100 Resp: 22,  CBG: 122

## 2017-06-11 NOTE — ED Triage Notes (Signed)
Patient states she is feeling suicidal because she is intoxicated and "when I drink, I feel suicidal". Patient states [place] has been in [place] and is originally from [place]. Patient states she lives with her sister and nephew.

## 2017-06-12 ENCOUNTER — Emergency Department (HOSPITAL_COMMUNITY): Payer: Self-pay

## 2017-06-12 DIAGNOSIS — F1014 Alcohol abuse with alcohol-induced mood disorder: Secondary | ICD-10-CM | POA: Diagnosis present

## 2017-06-12 DIAGNOSIS — F1420 Cocaine dependence, uncomplicated: Secondary | ICD-10-CM | POA: Diagnosis present

## 2017-06-12 LAB — RAPID URINE DRUG SCREEN, HOSP PERFORMED
Amphetamines: NOT DETECTED
BARBITURATES: NOT DETECTED
BENZODIAZEPINES: NOT DETECTED
COCAINE: POSITIVE — AB
Opiates: NOT DETECTED
TETRAHYDROCANNABINOL: NOT DETECTED

## 2017-06-12 LAB — CBC
HCT: 30.3 % — ABNORMAL LOW (ref 36.0–46.0)
HEMOGLOBIN: 9.7 g/dL — AB (ref 12.0–15.0)
MCH: 27.8 pg (ref 26.0–34.0)
MCHC: 32 g/dL (ref 30.0–36.0)
MCV: 86.8 fL (ref 78.0–100.0)
Platelets: 279 10*3/uL (ref 150–400)
RBC: 3.49 MIL/uL — ABNORMAL LOW (ref 3.87–5.11)
RDW: 17 % — ABNORMAL HIGH (ref 11.5–15.5)
WBC: 6.2 10*3/uL (ref 4.0–10.5)

## 2017-06-12 LAB — COMPREHENSIVE METABOLIC PANEL
ALT: 20 U/L (ref 14–54)
ANION GAP: 8 (ref 5–15)
AST: 22 U/L (ref 15–41)
Albumin: 3.5 g/dL (ref 3.5–5.0)
Alkaline Phosphatase: 51 U/L (ref 38–126)
BUN: 11 mg/dL (ref 6–20)
CHLORIDE: 111 mmol/L (ref 101–111)
CO2: 23 mmol/L (ref 22–32)
CREATININE: 0.95 mg/dL (ref 0.44–1.00)
Calcium: 8.8 mg/dL — ABNORMAL LOW (ref 8.9–10.3)
GFR calc non Af Amer: >60 mL/min (ref >60)
Glucose, Bld: 106 mg/dL — ABNORMAL HIGH (ref 65–99)
Potassium: 3.7 mmol/L (ref 3.5–5.1)
SODIUM: 142 mmol/L (ref 135–145)
Total Bilirubin: 0.2 mg/dL — ABNORMAL LOW (ref 0.3–1.2)
Total Protein: 6.7 g/dL (ref 6.5–8.1)

## 2017-06-12 LAB — HEMOGLOBIN AND HEMATOCRIT, BLOOD
HCT: 29.6 % — ABNORMAL LOW (ref 36.0–46.0)
HEMOGLOBIN: 9.6 g/dL — AB (ref 12.0–15.0)

## 2017-06-12 LAB — ETHANOL: Alcohol, Ethyl (B): 180 mg/dL — ABNORMAL HIGH (ref <10)

## 2017-06-12 LAB — ACETAMINOPHEN LEVEL

## 2017-06-12 LAB — I-STAT BETA HCG BLOOD, ED (MC, WL, AP ONLY): I-stat hCG, quantitative: <5 m[IU]/mL (ref <5)

## 2017-06-12 LAB — SALICYLATE LEVEL

## 2017-06-12 LAB — OCCULT BLOOD X 1 CARD TO LAB, STOOL: Fecal Occult Bld: NEGATIVE

## 2017-06-12 MED ORDER — ACETAMINOPHEN 325 MG PO TABS: 650.0000 mg | ORAL_TABLET | ORAL | Status: DC | PRN

## 2017-06-12 MED ORDER — LORAZEPAM 1 MG PO TABS
0.0000 mg | ORAL_TABLET | Freq: Four times a day (QID) | ORAL | Status: DC
  Administered 2017-06-12 (×2): 1 mg via ORAL
  Filled 2017-06-12 (×2): qty 1

## 2017-06-12 MED ORDER — NICOTINE 21 MG/24HR TD PT24
21.0000 mg | MEDICATED_PATCH | Freq: Once | TRANSDERMAL | Status: DC
  Administered 2017-06-12: 21 mg via TRANSDERMAL
  Filled 2017-06-12: qty 1

## 2017-06-12 MED ORDER — ZOLPIDEM TARTRATE 5 MG PO TABS: 5.0000 mg | ORAL_TABLET | Freq: Every evening | ORAL | Status: DC | PRN

## 2017-06-12 MED ORDER — LEVOFLOXACIN 750 MG PO TABS: 750.0000 mg | ORAL_TABLET | Freq: Every day | ORAL | 0 refills | Status: DC

## 2017-06-12 MED ORDER — ALUM & MAG HYDROXIDE-SIMETH 200-200-20 MG/5ML PO SUSP: 30.0000 mL | Freq: Four times a day (QID) | ORAL | Status: DC | PRN

## 2017-06-12 MED ORDER — TRAZODONE HCL 100 MG PO TABS: 100.0000 mg | ORAL_TABLET | Freq: Every day | ORAL | Status: DC

## 2017-06-12 MED ORDER — THIAMINE HCL 100 MG/ML IJ SOLN: 100.0000 mg | Freq: Every day | INTRAMUSCULAR | Status: DC

## 2017-06-12 MED ORDER — LORAZEPAM 1 MG PO TABS: 0.0000 mg | ORAL_TABLET | Freq: Two times a day (BID) | ORAL | Status: DC

## 2017-06-12 MED ORDER — IBUPROFEN 800 MG PO TABS
800.0000 mg | ORAL_TABLET | Freq: Once | ORAL | Status: AC
  Administered 2017-06-12: 800 mg via ORAL
  Filled 2017-06-12: qty 1

## 2017-06-12 MED ORDER — NICOTINE 21 MG/24HR TD PT24: 21.0000 mg | MEDICATED_PATCH | Freq: Every day | TRANSDERMAL | Status: DC

## 2017-06-12 MED ORDER — LORAZEPAM 2 MG/ML IJ SOLN: 0.0000 mg | Freq: Two times a day (BID) | INTRAMUSCULAR | Status: DC

## 2017-06-12 MED ORDER — TETANUS-DIPHTH-ACELL PERTUSSIS 5-2.5-18.5 LF-MCG/0.5 IM SUSP
0.5000 mL | Freq: Once | INTRAMUSCULAR | Status: AC
  Administered 2017-06-12: 0.5 mL via INTRAMUSCULAR
  Filled 2017-06-12: qty 0.5

## 2017-06-12 MED ORDER — SERTRALINE HCL 50 MG PO TABS
50.0000 mg | ORAL_TABLET | Freq: Every day | ORAL | Status: DC
  Administered 2017-06-12: 50 mg via ORAL
  Filled 2017-06-12: qty 1

## 2017-06-12 MED ORDER — CEPHALEXIN 500 MG PO CAPS
500.0000 mg | ORAL_CAPSULE | Freq: Four times a day (QID) | ORAL | Status: DC
  Administered 2017-06-12 (×3): 500 mg via ORAL
  Filled 2017-06-12 (×4): qty 1

## 2017-06-12 MED ORDER — ONDANSETRON HCL 4 MG PO TABS: 4.0000 mg | ORAL_TABLET | Freq: Three times a day (TID) | ORAL | Status: DC | PRN

## 2017-06-12 MED ORDER — LEVOFLOXACIN 750 MG PO TABS
750.0000 mg | ORAL_TABLET | Freq: Every day | ORAL | Status: DC
  Administered 2017-06-12: 750 mg via ORAL
  Filled 2017-06-12 (×2): qty 1

## 2017-06-12 MED ORDER — CEPHALEXIN 500 MG PO CAPS: 500.0000 mg | ORAL_CAPSULE | Freq: Three times a day (TID) | ORAL | 0 refills | Status: DC

## 2017-06-12 MED ORDER — LORAZEPAM 2 MG/ML IJ SOLN: 0.0000 mg | Freq: Four times a day (QID) | INTRAMUSCULAR | Status: DC

## 2017-06-12 MED ORDER — VITAMIN B-1 100 MG PO TABS
100.0000 mg | ORAL_TABLET | Freq: Every day | ORAL | Status: DC
  Administered 2017-06-12: 100 mg via ORAL
  Filled 2017-06-12: qty 1

## 2017-06-12 NOTE — ED Notes (Signed)
Pt discharged home. Discharged instructions read to pt who verbalized understanding. All belongings returned to pt who signed for same. Denies SI/HI, is not delusional and not responding to internal stimuli. Escorted pt to the ED exit.    

## 2017-06-12 NOTE — BHH Counselor (Signed)
Per Hillery Jacksanika Lewis, NP: Patient meets criteria for inpatient treatment.  [facility] Rail[facility]ann Glover, RN no appropriate beds available at [facility].  TTS to seek placement.  [facility] Attending Provider, Ward, DO,notified at 0330.
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
## 2017-06-12 NOTE — BH Assessment (Signed)
[facility] Assessment Progress Note  Per Mojeed Akintayo, MD, this pt does not require psychiatric hospitalization at this time.  Pt is to be discharged from [facility] with recommendation to follow up with [facility].  This has been included in pt's discharge instructions.  Pt would also benefit from seeing Peer Support Specialists; they will be asked to speak to pt.  Pt's nurse, Diane, has been notified.  Shirley Pfund, MA Triage Specialist [phone]     

## 2017-06-12 NOTE — BHH Suicide Risk Assessment (Signed)
Suicide Risk Assessment  Discharge Assessment   [facility]   Principal Problem: Alcohol abuse with alcohol-induced mood disorder [facility](HCC) Discharge Diagnoses:  Patient Active Problem List   Diagnosis Date Noted  . Cocaine abuse (HCC) [F14.10] 06/12/2017    Priority: High  . Alcohol abuse with alcohol-induced mood disorder (HCC) [F10.14] 06/12/2017    Priority: High  . Alcoholic intoxication without complication (HCC) [F10.920]     Priority: High  . Substance induced mood disorder (HCC) [F19.94] 03/04/2017  . MDD (major depressive disorder), recurrent episode (HCC) [F33.9] 03/02/2017  . Suicide attempt (HCC) [T14.91XA] 10/15/2016  . HTN (hypertension), benign [I10] 10/15/2016  . Irregular heart beat [I49.9] 10/15/2016  . Suicidal ideation [R45.851] 10/15/2016    Total Time spent with patient: 45 minutes  Musculoskeletal: Strength & Muscle Tone: within normal limits Gait & Station: normal Patient leans: N/A  Psychiatric Specialty Exam:   Blood pressure 121/69, pulse 77, temperature 99 F (37.2 C), temperature source Oral, resp. rate 20, SpO2 96 %.There is no height or weight on file to calculate BMI.  General Appearance: Casual  Eye Contact::  Good  Speech:  Normal Rate409  Volume:  Normal  Mood:  Euthymic  Affect:  Congruent  Thought Process:  Coherent and Descriptions of Associations: Intact  Orientation:  Full (Time, Place, and Person)  Thought Content:  WDL and Logical  Suicidal Thoughts:  No  Homicidal Thoughts:  No  Memory:  Immediate;   Good Recent;   Good Remote;   Good  Judgement:  Fair  Insight:  Fair  Psychomotor Activity:  Normal  Concentration:  Good  Recall:  Good  Fund of Knowledge:Fair  Language: Good  Akathisia:  No  Handed:  Right  AIMS (if indicated):     Assets:  Leisure Time Physical Health Resilience Social Support  Sleep:     Cognition: WNL  ADL's:  Intact   Mental Status Per Nursing Assessment::   On  Admission:   31 yo female who came to the ED after drinking and having suicidal ideations.  Today, she is clear and coherent with no suicidal ideations or homicidal ideations or withdrawal symptoms.  She is interested in Peer Support for rehab resources, consult placed.  Stable for discharge.  Demographic Factors:  Caucasian  Loss Factors: NA  Historical Factors: NA  Risk Reduction Factors:   Sense of responsibility to family, Living with another person, especially a relative and Positive social support  Continued Clinical Symptoms:  one  Cognitive Features That Contribute To Risk:  None    Suicide Risk:  Minimal: No identifiable suicidal ideation.  Patients presenting with no risk factors but with morbid ruminations; may be classified as minimal risk based on the severity of the depressive symptoms    Plan Of Care/Follow-up recommendations:  Activity:  as tolerated Diet:  heart healthy diet  Giann Obara, NP 06/12/2017, 9:31 AM

## 2017-06-12 NOTE — ED Notes (Signed)
Informed Dr. Preston FleetingGlick that pt's labs and x-ray results are complete.
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
## 2017-06-12 NOTE — Patient Outreach (Signed)
ED Peer Support Specialist Patient Intake (Complete at intake & 30-60 Day Follow-up)  Name: Shirley Murphy  MRN: [id number]  Age: 31 y.o.   Date of Admission: 06/12/2017  Intake: Initial Comments:      Primary Reason Admitted: depression, SI, poly substance use with Alcohol, Cocaine and Benzodiazepines  Lab values: Alcohol/ETOH: Positive Positive UDS? Yes Amphetamines: No Barbiturates: No Benzodiazepines: No Cocaine: Yes Opiates: No Cannabinoids: No  Demographic information: Gender: Female Ethnicity: White Marital Status: Separated Insurance Status: Uninsured/Self-pay Control and instrumentation engineereceives non-medical governmental assistance (Work Engineer, agriculturalirst/Welfare, Sales executivefood stamps, etc.: No Lives with: Sibling (Sister) Living situation: House/Apartment  Reported Patient History: Patient reported health conditions: Depression Patient aware of HIV and hepatitis status: Yes (comment) (Hepatitis C)  In past year, has patient visited ED for any reason? Yes  Number of ED visits: 1  Reason(s) for visit: overdose on blood pressure pills  In past year, has patient been hospitalized for any reason? Yes  Number of hospitalizations: 1  Reason(s) for hospitalization: overdose on blood pressure pills  In past year, has patient been arrested? No  Number of arrests:    Reason(s) for arrest:    In past year, has patient been incarcerated? No  Number of incarcerations:    Reason(s) for incarceration:    In past year, has patient received medication-assisted treatment? No  In past year, patient received the following treatments: Residential treatment (non-hospital) ([facility] )  In past year, has patient received any harm reduction services? No  Did this include any of the following?    In past year, has patient received care from a mental health provider for diagnosis other than SUD? No  In past year, is this first time patient has overdosed? No (Overdose on blood pressure pills)  Number of past overdoses:  1  In past year, is this first time patient has been hospitalized for an overdose? No (blood pressure pills)  Number of hospitalizations for overdose(s): 1  Is patient currently receiving treatment for a mental health diagnosis? No  Patient reports experiencing difficulty participating in SUD treatment: No    Most important reason(s) for this difficulty?    Has patient received prior services for treatment? Yes ([facility], [facility])  In past, patient has received services from following agencies:  Floydene Flock([facility], [facility])  Plan of Care:  Suggested follow up at these agencies/treatment centers:  [facility] for detox/treatment beds )  Other information:    Bartholomew BoardsJohn Zayden Maffei, CPSS  06/12/2017 12:05 PM

## 2017-06-12 NOTE — BHH Counselor (Signed)
Pt was [facility] to the following inpatient treatment facilities:  [facility]. [facility]. [facility]. [facility]. [facility]. [facility]. [facility]. [facility]. [facility]. [facility]. [facility]. [facility].   [facility] D Amia Rynders, MS, [facility], [facility] Triage Specialist [phone]

## 2017-06-12 NOTE — Discharge Instructions (Signed)
To help you maintain a sober lifestyle, a substance abuse treatment program may be beneficial to you.  Contact [facility] at your earliest opportunity to ask about enrolling in their program: ° °     [facility] ([facility] °     [address] °     [place], [place] [address] °     [phone] °     New patients are seen at the walk-in clinic every Tuesday from 9:00 am - 12:00 pm °

## 2017-06-12 NOTE — ED Notes (Signed)
Patient reports SI with no plan. Patient denies HI/AVH. Plan of care discussed. Patient is able to contract for safety at this time. Encouragement and support provided and safety maintain. Q 15 min safety checks in place and video monitoring.
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
## 2017-06-30 ENCOUNTER — Ambulatory Visit (HOSPITAL_COMMUNITY): Payer: Self-pay

## 2017-07-04 ENCOUNTER — Emergency Department (HOSPITAL_COMMUNITY)
Admission: EM | Admit: 2017-07-04 | Discharge: 2017-07-05 | Disposition: A | Discharge status: Home / Self Care | Payer: Medicaid - Out of State | Attending: Emergency Medicine | Admitting: Emergency Medicine

## 2017-07-04 ENCOUNTER — Encounter (HOSPITAL_COMMUNITY): Payer: Self-pay | Admitting: Emergency Medicine

## 2017-07-04 DIAGNOSIS — F1014 Alcohol abuse with alcohol-induced mood disorder: Secondary | ICD-10-CM

## 2017-07-04 DIAGNOSIS — F329 Major depressive disorder, single episode, unspecified: Secondary | ICD-10-CM | POA: Insufficient documentation

## 2017-07-04 DIAGNOSIS — I10 Essential (primary) hypertension: Secondary | ICD-10-CM | POA: Insufficient documentation

## 2017-07-04 DIAGNOSIS — F17210 Nicotine dependence, cigarettes, uncomplicated: Secondary | ICD-10-CM | POA: Insufficient documentation

## 2017-07-04 DIAGNOSIS — R45851 Suicidal ideations: Secondary | ICD-10-CM

## 2017-07-04 DIAGNOSIS — Z79899 Other long term (current) drug therapy: Secondary | ICD-10-CM | POA: Insufficient documentation

## 2017-07-04 DIAGNOSIS — Z113 Encounter for screening for infections with a predominantly sexual mode of transmission: Secondary | ICD-10-CM | POA: Insufficient documentation

## 2017-07-04 DIAGNOSIS — Z711 Person with feared health complaint in whom no diagnosis is made: Secondary | ICD-10-CM

## 2017-07-04 DIAGNOSIS — F1410 Cocaine abuse, uncomplicated: Secondary | ICD-10-CM

## 2017-07-04 DIAGNOSIS — F1910 Other psychoactive substance abuse, uncomplicated: Secondary | ICD-10-CM

## 2017-07-04 LAB — I-STAT BETA HCG BLOOD, ED (MC, WL, AP ONLY)

## 2017-07-04 LAB — RAPID URINE DRUG SCREEN, HOSP PERFORMED
AMPHETAMINES: NOT DETECTED
Barbiturates: NOT DETECTED
Benzodiazepines: NOT DETECTED
Cocaine: POSITIVE — AB
OPIATES: NOT DETECTED
TETRAHYDROCANNABINOL: POSITIVE — AB

## 2017-07-04 MED ORDER — DIPHENHYDRAMINE HCL 25 MG PO CAPS
25.0000 mg | ORAL_CAPSULE | Freq: Once | ORAL | Status: AC
  Administered 2017-07-05: 25 mg via ORAL
  Filled 2017-07-04: qty 1

## 2017-07-04 MED ORDER — NICOTINE 21 MG/24HR TD PT24
21.0000 mg | MEDICATED_PATCH | TRANSDERMAL | Status: DC
  Administered 2017-07-05: 21 mg via TRANSDERMAL
  Filled 2017-07-04: qty 1
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
## 2017-07-05 LAB — COMPREHENSIVE METABOLIC PANEL
ALT: 23 U/L (ref 14–54)
ANION GAP: 8 (ref 5–15)
AST: 18 U/L (ref 15–41)
Albumin: 4 g/dL (ref 3.5–5.0)
Alkaline Phosphatase: 50 U/L (ref 38–126)
BUN: 15 mg/dL (ref 6–20)
CHLORIDE: 106 mmol/L (ref 101–111)
CO2: 23 mmol/L (ref 22–32)
CREATININE: 0.84 mg/dL (ref 0.44–1.00)
Calcium: 8.8 mg/dL — ABNORMAL LOW (ref 8.9–10.3)
Glucose, Bld: 90 mg/dL (ref 65–99)
Potassium: 3.7 mmol/L (ref 3.5–5.1)
SODIUM: 137 mmol/L (ref 135–145)
Total Bilirubin: 0.4 mg/dL (ref 0.3–1.2)
Total Protein: 7.2 g/dL (ref 6.5–8.1)

## 2017-07-05 LAB — CBC
HCT: 35 % — ABNORMAL LOW (ref 36.0–46.0)
Hemoglobin: 11.1 g/dL — ABNORMAL LOW (ref 12.0–15.0)
MCH: 26.4 pg (ref 26.0–34.0)
MCHC: 31.7 g/dL (ref 30.0–36.0)
MCV: 83.1 fL (ref 78.0–100.0)
PLATELETS: 293 10*3/uL (ref 150–400)
RBC: 4.21 MIL/uL (ref 3.87–5.11)
RDW: 15.7 % — ABNORMAL HIGH (ref 11.5–15.5)
WBC: 7.2 10*3/uL (ref 4.0–10.5)

## 2017-07-05 LAB — ETHANOL: ALCOHOL ETHYL (B): 73 mg/dL — AB (ref <10)

## 2017-07-05 LAB — ACETAMINOPHEN LEVEL

## 2017-07-05 LAB — SALICYLATE LEVEL

## 2017-07-05 MED ORDER — THIAMINE HCL 100 MG/ML IJ SOLN: 100.0000 mg | Freq: Every day | INTRAMUSCULAR | Status: DC

## 2017-07-05 MED ORDER — IBUPROFEN 800 MG PO TABS
800.0000 mg | ORAL_TABLET | Freq: Once | ORAL | Status: AC
  Administered 2017-07-05: 800 mg via ORAL
  Filled 2017-07-05: qty 1

## 2017-07-05 MED ORDER — GABAPENTIN 300 MG PO CAPS: 300.0000 mg | ORAL_CAPSULE | Freq: Four times a day (QID) | ORAL | 0 refills | Status: DC

## 2017-07-05 MED ORDER — ONDANSETRON 4 MG PO TBDP
4.0000 mg | ORAL_TABLET | Freq: Once | ORAL | Status: AC
  Administered 2017-07-05: 4 mg via ORAL
  Filled 2017-07-05: qty 1

## 2017-07-05 MED ORDER — STERILE WATER FOR INJECTION IJ SOLN
INTRAMUSCULAR | Status: AC
  Administered 2017-07-05: 10 mL
  Filled 2017-07-05: qty 10

## 2017-07-05 MED ORDER — LORAZEPAM 1 MG PO TABS: 0.0000 mg | ORAL_TABLET | Freq: Two times a day (BID) | ORAL | Status: DC

## 2017-07-05 MED ORDER — VITAMIN B-1 100 MG PO TABS
100.0000 mg | ORAL_TABLET | Freq: Every day | ORAL | Status: DC
  Administered 2017-07-05: 100 mg via ORAL
  Filled 2017-07-05: qty 1

## 2017-07-05 MED ORDER — CEFTRIAXONE SODIUM 250 MG IJ SOLR
250.0000 mg | Freq: Once | INTRAMUSCULAR | Status: AC
Start: 2017-07-05 — End: 2017-07-05
  Administered 2017-07-05: 250 mg via INTRAMUSCULAR
  Filled 2017-07-05: qty 250

## 2017-07-05 MED ORDER — LORAZEPAM 2 MG/ML IJ SOLN: 0.0000 mg | Freq: Two times a day (BID) | INTRAMUSCULAR | Status: DC

## 2017-07-05 MED ORDER — LORAZEPAM 1 MG PO TABS
0.0000 mg | ORAL_TABLET | Freq: Four times a day (QID) | ORAL | Status: DC
  Administered 2017-07-05 (×2): 1 mg via ORAL
  Filled 2017-07-05 (×2): qty 1

## 2017-07-05 MED ORDER — GABAPENTIN 300 MG PO CAPS
300.0000 mg | ORAL_CAPSULE | Freq: Three times a day (TID) | ORAL | Status: DC
  Administered 2017-07-05: 300 mg via ORAL
  Filled 2017-07-05: qty 1

## 2017-07-05 MED ORDER — AZITHROMYCIN 250 MG PO TABS
1000.0000 mg | ORAL_TABLET | Freq: Once | ORAL | Status: AC
  Administered 2017-07-05: 1000 mg via ORAL
  Filled 2017-07-05: qty 4

## 2017-07-05 MED ORDER — STERILE WATER FOR INJECTION IJ SOLN
3.0000 mL | Freq: Once | INTRAMUSCULAR | Status: AC
  Administered 2017-07-05: 3 mL via INTRAMUSCULAR

## 2017-07-05 MED ORDER — LORAZEPAM 2 MG/ML IJ SOLN: 0.0000 mg | Freq: Four times a day (QID) | INTRAMUSCULAR | Status: DC
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
## 2017-07-05 NOTE — BH Assessment (Signed)
[facility]HH Assessment Progress Note  Per Nira ConnJason Berry, NP pt is recommended for inpt treatment. EDP Horton, Mayer Maskerourtney F, MD notified and in agreement with disposition. Pt's nurse made aware of recommendation. TTS to seek [facility].   Princess BruinsAquicha Tiya Schrupp, MSW, LCSW Therapeutic Triage Specialist  [phone]

## 2017-07-05 NOTE — ED Notes (Signed)
Patient denies pain and is resting comfortably.  

## 2017-07-05 NOTE — ED Notes (Signed)
Pt discharged home. Discharged instructions read to pt who verbalized understanding. All belongings returned to pt who signed for same. Denies SI/HI, is not delusional and not responding to internal stimuli. Escorted pt to the ED exit.    

## 2017-07-05 NOTE — ED Notes (Signed)
Herminio HeadsFriend Louie, number is [phone]

## 2017-07-05 NOTE — ED Notes (Signed)
Denies SI/HI/AVH. Pt notes, "I would like to go [facility] for detox." Pt noted she is just tired. Staff will continue to monitor, meet needs, and maintain safety.
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
## 2017-08-04 ENCOUNTER — Encounter (HOSPITAL_COMMUNITY): Payer: Self-pay | Admitting: Nurse Practitioner

## 2017-08-04 ENCOUNTER — Emergency Department (HOSPITAL_COMMUNITY)
Admission: EM | Admit: 2017-08-04 | Discharge: 2017-08-06 | Disposition: A | Discharge status: Other Acute Inpatient Hospital | Payer: Medicaid - Out of State | Attending: Emergency Medicine | Admitting: Emergency Medicine

## 2017-08-04 ENCOUNTER — Other Ambulatory Visit: Payer: Self-pay

## 2017-08-04 DIAGNOSIS — F329 Major depressive disorder, single episode, unspecified: Secondary | ICD-10-CM | POA: Insufficient documentation

## 2017-08-04 DIAGNOSIS — F17210 Nicotine dependence, cigarettes, uncomplicated: Secondary | ICD-10-CM | POA: Insufficient documentation

## 2017-08-04 DIAGNOSIS — F489 Nonpsychotic mental disorder, unspecified: Secondary | ICD-10-CM | POA: Insufficient documentation

## 2017-08-04 DIAGNOSIS — Z915 Personal history of self-harm: Secondary | ICD-10-CM | POA: Insufficient documentation

## 2017-08-04 DIAGNOSIS — F1190 Opioid use, unspecified, uncomplicated: Secondary | ICD-10-CM | POA: Insufficient documentation

## 2017-08-04 DIAGNOSIS — R45851 Suicidal ideations: Secondary | ICD-10-CM | POA: Insufficient documentation

## 2017-08-04 DIAGNOSIS — Z79899 Other long term (current) drug therapy: Secondary | ICD-10-CM | POA: Insufficient documentation

## 2017-08-04 DIAGNOSIS — I10 Essential (primary) hypertension: Secondary | ICD-10-CM | POA: Insufficient documentation

## 2017-08-04 DIAGNOSIS — F10920 Alcohol use, unspecified with intoxication, uncomplicated: Secondary | ICD-10-CM | POA: Diagnosis present

## 2017-08-04 LAB — CBC
HCT: 36.2 % (ref 36.0–46.0)
Hemoglobin: 11.5 g/dL — ABNORMAL LOW (ref 12.0–15.0)
MCH: 26.1 pg (ref 26.0–34.0)
MCHC: 31.8 g/dL (ref 30.0–36.0)
MCV: 82.1 fL (ref 78.0–100.0)
PLATELETS: 228 10*3/uL (ref 150–400)
RBC: 4.41 MIL/uL (ref 3.87–5.11)
RDW: 16 % — AB (ref 11.5–15.5)
WBC: 6.1 10*3/uL (ref 4.0–10.5)

## 2017-08-04 LAB — COMPREHENSIVE METABOLIC PANEL
ALT: 26 U/L (ref 14–54)
ANION GAP: 9 (ref 5–15)
AST: 24 U/L (ref 15–41)
Albumin: 4.1 g/dL (ref 3.5–5.0)
Alkaline Phosphatase: 61 U/L (ref 38–126)
BILIRUBIN TOTAL: 0.3 mg/dL (ref 0.3–1.2)
BUN: 13 mg/dL (ref 6–20)
CHLORIDE: 104 mmol/L (ref 101–111)
CO2: 25 mmol/L (ref 22–32)
Calcium: 9.1 mg/dL (ref 8.9–10.3)
Creatinine, Ser: 0.75 mg/dL (ref 0.44–1.00)
Glucose, Bld: 98 mg/dL (ref 65–99)
POTASSIUM: 3.8 mmol/L (ref 3.5–5.1)
Sodium: 138 mmol/L (ref 135–145)
TOTAL PROTEIN: 7.7 g/dL (ref 6.5–8.1)

## 2017-08-04 LAB — I-STAT BETA HCG BLOOD, ED (MC, WL, AP ONLY)

## 2017-08-04 LAB — ETHANOL: ALCOHOL ETHYL (B): 151 mg/dL — AB (ref <10)

## 2017-08-04 LAB — RAPID URINE DRUG SCREEN, HOSP PERFORMED
Amphetamines: NOT DETECTED
BENZODIAZEPINES: NOT DETECTED
Barbiturates: NOT DETECTED
Cocaine: POSITIVE — AB
OPIATES: NOT DETECTED
Tetrahydrocannabinol: NOT DETECTED

## 2017-08-04 LAB — ACETAMINOPHEN LEVEL: Acetaminophen (Tylenol), Serum: <10 ug/mL — ABNORMAL LOW (ref 10–30)

## 2017-08-04 LAB — SALICYLATE LEVEL

## 2017-08-04 MED ORDER — THIAMINE HCL 100 MG/ML IJ SOLN: 100.0000 mg | Freq: Every day | INTRAMUSCULAR | Status: DC

## 2017-08-04 MED ORDER — LORAZEPAM 1 MG PO TABS
0.0000 mg | ORAL_TABLET | Freq: Four times a day (QID) | ORAL | Status: AC
  Administered 2017-08-04 – 2017-08-05 (×6): 1 mg via ORAL
  Filled 2017-08-04 (×6): qty 1

## 2017-08-04 MED ORDER — LORAZEPAM 1 MG PO TABS: 0.0000 mg | ORAL_TABLET | Freq: Two times a day (BID) | ORAL | Status: DC

## 2017-08-04 MED ORDER — LORAZEPAM 2 MG/ML IJ SOLN
0.0000 mg | Freq: Four times a day (QID) | INTRAMUSCULAR | Status: AC
Start: 2017-08-04 — End: 2017-08-06

## 2017-08-04 MED ORDER — LORAZEPAM 2 MG/ML IJ SOLN
0.0000 mg | Freq: Two times a day (BID) | INTRAMUSCULAR | Status: DC
Start: 2017-08-06 — End: 2017-08-06

## 2017-08-04 MED ORDER — VITAMIN B-1 100 MG PO TABS
100.0000 mg | ORAL_TABLET | Freq: Every day | ORAL | Status: DC
  Administered 2017-08-04 – 2017-08-05 (×2): 100 mg via ORAL
  Filled 2017-08-04 (×2): qty 1

## 2017-08-04 MED ORDER — NICOTINE 21 MG/24HR TD PT24
21.0000 mg | MEDICATED_PATCH | Freq: Every day | TRANSDERMAL | Status: DC
  Administered 2017-08-04 – 2017-08-05 (×2): 21 mg via TRANSDERMAL
  Filled 2017-08-04 (×2): qty 1

## 2017-08-04 MED ORDER — TRAZODONE HCL 50 MG PO TABS
50.0000 mg | ORAL_TABLET | Freq: Every evening | ORAL | Status: DC | PRN
  Administered 2017-08-04 – 2017-08-05 (×2): 50 mg via ORAL
  Filled 2017-08-04 (×2): qty 1

## 2017-08-04 MED ORDER — IBUPROFEN 200 MG PO TABS
600.0000 mg | ORAL_TABLET | Freq: Four times a day (QID) | ORAL | Status: DC | PRN
  Administered 2017-08-04 – 2017-08-06 (×4): 600 mg via ORAL
  Filled 2017-08-04 (×4): qty 3

## 2017-08-04 NOTE — ED Notes (Signed)
Bed: [facility] Expected date:  Expected time:  Means of arrival:  Comments: 

## 2017-08-04 NOTE — ED Notes (Signed)
Notified pharmacy to reschedule CIWA/Ativan at 2100.

## 2017-08-04 NOTE — ED Provider Notes (Signed)
[facility]-EMERGENCY DEPT Provider Note   CSN: [id number] Arrival date & time: 08/04/17  0405     History   Chief Complaint Chief Complaint  Patient presents with  . Suicidal    HPI Shirley Murphy is a 31 y.o. female.  The history is provided by the patient and medical records.    31 year old female with history of hepatitis C, hypertension, polysubstance abuse, presenting to the ED with suicidal ideation.  Patient was reportedly dropped off of the police department by her sister.  Patient reports she is a longtime alcoholic and has been trying to get help for this for several months now.  States she has been to multiple facilities.  She states all of them keep her overnight and then discharge her with resources, but she has out-of-state Medicaid and cannot afford to pay out-of-pocket.  States lately her symptoms have been worse as she found out that her children have been legally adopted.  States they have been in foster care for the past 6 years, but reports everything seemed to "come to ahead" when she got the noticed that their names have been legally changed.  States 3 days ago she took a whole bottle of pills, states she is not sure exactly what it was but it did not seem to have any effect on her.  States she is also been abusing heroin which she generally does not use.  She also used mildly, smoked crack with her sister, and continued drinking heavily.  States she just does not want to keep living this way and she sees no point to continue on so she has been trying to "just end it".  States since she has not had any relief with overdosing her next plan was to walk out into traffic.  Patient states "I know it is my time to die, I just wish someone would let me die".  She denies any homicidal ideation.  No hallucinations.  States she has psychiatric history but has been off her meds for several weeks now.  Past Medical History:  Diagnosis Date  . Chest pain   .  Hepatitis C   . Hypertension   . Irregular heart beat     Patient Active Problem List   Diagnosis Date Noted  . Cocaine abuse (HCC) 06/12/2017  . Alcohol abuse with alcohol-induced mood disorder (HCC) 06/12/2017  . Substance induced mood disorder (HCC) 03/04/2017  . MDD (major depressive disorder), recurrent episode (HCC) 03/02/2017  . Suicide attempt (HCC) 10/15/2016  . HTN (hypertension), benign 10/15/2016  . Irregular heart beat 10/15/2016  . Suicidal ideation 10/15/2016  . Alcoholic intoxication without complication [facility])     Past Surgical History:  Procedure Laterality Date  . c sections    . TUBAL LIGATION      OB History    No data available       Home Medications    Prior to Admission medications   Medication Sig Start Date End Date Taking? Authorizing Provider  cephALEXin (KEFLEX) 500 MG capsule Take 1 capsule (500 mg total) by mouth 3 (three) times daily. Patient not taking: Reported on 07/04/2017 06/12/17   Charm Rings, NP  gabapentin (NEURONTIN) 300 MG capsule Take 1 capsule (300 mg total) 4 (four) times daily by mouth. Patient not taking: Reported on 08/04/2017 07/05/17   Charm Rings, NP  levofloxacin (LEVAQUIN) 750 MG tablet Take 1 tablet (750 mg total) by mouth daily. Patient not taking: Reported on 07/04/2017 06/13/17  Charm RingsLord, Jamison Y, NP  sertraline (ZOLOFT) 50 MG tablet Take 1 tablet (50 mg total) by mouth daily. Patient not taking: Reported on 08/04/2017 03/05/17   Denzil Magnusonhomas, Lashunda, NP  traZODone (DESYREL) 50 MG tablet Take 1 tablet (50 mg total) by mouth at bedtime and may repeat dose one time if needed. Patient not taking: Reported on 08/04/2017 03/04/17   Denzil Magnusonhomas, Lashunda, NP    Family History History reviewed. No pertinent family history.  Social History Social History   Tobacco Use  . Smoking status: Current Every Day Smoker    Packs/day: 1.00    Types: Cigarettes  . Smokeless tobacco: Never Used  Substance Use Topics  . Alcohol  use: Yes  . Drug use: Yes    Types: Cocaine, Marijuana     Allergies   Other   Review of Systems Review of Systems  Psychiatric/Behavioral: Positive for suicidal ideas.  All other systems reviewed and are negative.    Physical Exam Updated Vital Signs BP (!) 158/88 (BP Location: Left Arm)   Pulse 79   Temp 98.6 F (37 C) (Oral)   Resp 18   Ht 5\' 2"  (1.575 m)   Wt 106.6 kg (235 lb)   LMP 06/30/2017   SpO2 99%   BMI 42.98 kg/m   Physical Exam  Constitutional: She is oriented to person, place, and time. She appears well-developed and well-nourished.  HENT:  Head: Normocephalic and atraumatic.  Mouth/Throat: Oropharynx is clear and moist.  Eyes: Conjunctivae and EOM are normal. Pupils are equal, round, and reactive to light.  Neck: Normal range of motion.  Cardiovascular: Normal rate, regular rhythm and normal heart sounds.  Pulmonary/Chest: Effort normal and breath sounds normal.  Abdominal: Soft. Bowel sounds are normal.  Musculoskeletal: Normal range of motion.  Bruising noted to bilateral AC's with track marks; multiple areas of what appears to be "skin picking" on her forearms; no drainage, bleeding, or signs of infection  Neurological: She is alert and oriented to person, place, and time.  Skin: Skin is warm and dry.  Psychiatric: She has a normal mood and affect. She is not actively hallucinating. She expresses suicidal ideation. She expresses no homicidal ideation. She expresses suicidal plans. She expresses no homicidal plans.  Nursing note and vitals reviewed.    ED Treatments / Results  Labs (all labs ordered are listed, but only abnormal results are displayed) Labs Reviewed  ETHANOL - Abnormal; Notable for the following components:      Result Value   Alcohol, Ethyl (B) 151 (*)    All other components within normal limits  ACETAMINOPHEN LEVEL - Abnormal; Notable for the following components:   Acetaminophen (Tylenol), Serum <10 (*)    All other  components within normal limits  CBC - Abnormal; Notable for the following components:   Hemoglobin 11.5 (*)    RDW 16.0 (*)    All other components within normal limits  RAPID URINE DRUG SCREEN, HOSP PERFORMED - Abnormal; Notable for the following components:   Cocaine POSITIVE (*)    All other components within normal limits  COMPREHENSIVE METABOLIC PANEL  SALICYLATE LEVEL  I-STAT BETA HCG BLOOD, ED ([facility], [facility], [facility] ONLY)  I-STAT BETA HCG BLOOD, ED ([facility], [facility], [facility] ONLY)    EKG  EKG Interpretation None       Radiology No results found.  Procedures Procedures (including critical care time)  Medications Ordered in ED Medications - No data to display   Initial Impression / Assessment and Plan /  ED Course  I have reviewed the triage vital signs and the nursing notes.  Pertinent labs & imaging results that were available during my care of the patient were reviewed by me and considered in my medical decision making (see chart for details).  31 year old female here with suicidal ideation.  Does have extensive history of polysubstance abuse.  Reports attempted overdose 3 days ago, has been abusing alcohol, crack cocaine, heroine, and mildly.  Reports she is trying to "end it".  Also has plan to run into traffic.  Denies HI/AVH.  Screening labs overall reassuring--ethanol 151. Will medically clear.  TTS evaluation pending.  Patient placed on CIWA protocol given her history of alcohol abuse.  TTS has attempted to evaluate patient, however sleeping.  Day team to try and assess when more awake.  Final Clinical Impressions(s) / ED Diagnoses   Final diagnoses:  Suicidal ideation    ED Discharge Orders    None       Garlon HatchetSanders, Lisa M, PA-C 08/04/17 [id number]    Zadie RhineWickline, Donald, MD 08/04/17 [phone]

## 2017-08-04 NOTE — Patient Outreach (Signed)
CPSS spoke with the patient and she states that she is interested in detox/inpatient substance use treatment. CPSS called [facility] and [facility] which they both are not able to take the patient. Patient was at [facility] two months ago for detox. CPSS will make a referral for [facility] detox/substance use treatment. This is the third time CPSS has met with the patient in the past 2 months.

## 2017-08-04 NOTE — ED Triage Notes (Signed)
Pt BIB via [employer] from being dropped off by sister. Per [employer] patient reports being Suicidal. Patient reports taking an unknown amount of pills 3 days ago with no success, yesterday took some heroin and etoh and that didn't work and today she was found on the side of the road stating she was going to run into traffic. Per [employer] in route patient patient was tearful and stating "I am going to die, why won't someone just let me die!". Patient is ambulatory, cooperative, and calm.

## 2017-08-04 NOTE — ED Notes (Signed)
Patient states "I want to run into traffic or overdose. I am so tired of living. I have so much pain in my heart and I drink everyday to try to numb the feelings." She is tearful when talking with min. Eye contact.

## 2017-08-04 NOTE — ED Notes (Signed)
Report given to [facility] RN 

## 2017-08-04 NOTE — ED Notes (Signed)
Pt A&O x 3, no distress noted, calm & cooperative.  Resting at present.  Monitoring for safety, Q 15 min checks in effect. Remains SI.
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
## 2017-08-04 NOTE — ED Notes (Signed)
Pt notes she is here for ETOH and SI. She continues to feel SI. Pt contracted to safety. CIWA 5, administered 1 mg prn med (see MAR). Denies HI/AVH. Staff will continue to monitor, meet needs, and maintain safety.

## 2017-08-04 NOTE — Progress Notes (Signed)
TTS attempted to complete assessment. Pt is sleeping and unable to be aroused. Pt's name was called several times and stimulation was attempted by gently touching the pt's leg in order to wake the pt but she would not wake up. Assessment to be completed once pt is alert.   Princess BruinsAquicha Bhavesh Vazquez, MSW, LCSW Therapeutic Triage Specialist  [phone]'

## 2017-08-05 DIAGNOSIS — F10920 Alcohol use, unspecified with intoxication, uncomplicated: Secondary | ICD-10-CM | POA: Diagnosis not present

## 2017-08-05 DIAGNOSIS — F17210 Nicotine dependence, cigarettes, uncomplicated: Secondary | ICD-10-CM

## 2017-08-05 DIAGNOSIS — R450 Nervousness: Secondary | ICD-10-CM | POA: Diagnosis not present

## 2017-08-05 DIAGNOSIS — F1210 Cannabis abuse, uncomplicated: Secondary | ICD-10-CM | POA: Diagnosis not present

## 2017-08-05 DIAGNOSIS — R51 Headache: Secondary | ICD-10-CM

## 2017-08-05 DIAGNOSIS — R5383 Other fatigue: Secondary | ICD-10-CM | POA: Diagnosis not present

## 2017-08-05 DIAGNOSIS — F419 Anxiety disorder, unspecified: Secondary | ICD-10-CM

## 2017-08-05 DIAGNOSIS — R5381 Other malaise: Secondary | ICD-10-CM | POA: Diagnosis not present

## 2017-08-05 DIAGNOSIS — F1410 Cocaine abuse, uncomplicated: Secondary | ICD-10-CM

## 2017-08-05 NOTE — Progress Notes (Signed)
[facility] ED [facility] PROGRESS NOTE  08/05/2017 1:25 PM Shirley Murphy  MRN:  [id number]   Subjective: Shirley Murphy is awake, alert and oriented. Seen resting in bed. Patient was evaluated by MD and NP. Denies suicidal or homicidal ideation. Denies auditory or visual hallucination and does not appear to be responding to internal stimuli. Patient denies depression or depressive symptoms.  Patient reports headaches, sweating and tiredness this morning. Patient is requesting Ativan for her withdrawals.   Peer Support reports patient has a bed available at [facility] for 08/06/2017 at 10:00 am. Support, encouragement and reassurance was provided.   Principal Problem: Alcoholic intoxication without complication ([place]) Diagnosis:   Patient Active Problem List   Diagnosis Date Noted  . Cocaine abuse ([place]) [F14.10] 06/12/2017  . Alcohol abuse with alcohol-induced mood disorder ([place]) [F10.14] 06/12/2017  . Substance induced mood disorder ([place]) [F19.94] 03/04/2017  . MDD (major depressive disorder), recurrent episode ([place]) [F33.9] 03/02/2017  . Suicide attempt ([place]) [T14.91XA] 10/15/2016  . HTN (hypertension), benign [I10] 10/15/2016  . Irregular heart beat [I49.9] 10/15/2016  . Suicidal ideation [R45.851] 10/15/2016  . Alcoholic intoxication without complication ([place]) [W58.099]    Total Time spent with patient: 20 minutes  Past Psychiatric History: Alcohol abuse and cocaine abuse.  Past Medical History:  Past Medical History:  Diagnosis Date  . Chest pain   . Hepatitis C   . Hypertension   . Irregular heart beat     Past Surgical History:  Procedure Laterality Date  . c sections    . TUBAL LIGATION     Family History: History reviewed. No pertinent family history. Family Psychiatric  History: Unknown  Social History:  Social History   Substance and Sexual Activity  Alcohol Use Yes     Social History   Substance and Sexual Activity  Drug Use Yes  . Types: Cocaine, Marijuana     Social History   Socioeconomic History  . Marital status: Legally Separated    Spouse name: None  . Number of children: None  . Years of education: None  . Highest education level: None  Social Needs  . Financial resource strain: None  . Food insecurity - worry: None  . Food insecurity - inability: None  . Transportation needs - medical: None  . Transportation needs - non-medical: None  Occupational History  . None  Tobacco Use  . Smoking status: Current Every Day Smoker    Packs/day: 1.00    Types: Cigarettes  . Smokeless tobacco: Never Used  Substance and Sexual Activity  . Alcohol use: Yes  . Drug use: Yes    Types: Cocaine, Marijuana  . Sexual activity: Yes    Birth control/protection: None  Other Topics Concern  . None  Social History Narrative  . None   Additional Social History: N/A  Sleep: Fair  Appetite:  Fair  Current Medications: Current Facility-Administered Medications  Medication Dose Route Frequency Provider Last Rate Last Dose  . ibuprofen (ADVIL,MOTRIN) tablet 600 mg  600 mg Oral Q6H PRN Laverle Hobby, PA-C   600 mg at 08/05/17 0757  . LORazepam (ATIVAN) injection 0-4 mg  0-4 mg Intravenous Q6H Larene Pickett, PA-C       Or  . LORazepam (ATIVAN) tablet 0-4 mg  0-4 mg Oral Q6H Larene Pickett, PA-C   1 mg at 08/05/17 1004  . [START ON 08/06/2017] LORazepam (ATIVAN) injection 0-4 mg  0-4 mg Intravenous Q12H Larene Pickett, PA-C       Or  . [  START ON 08/06/2017] LORazepam (ATIVAN) tablet 0-4 mg  0-4 mg Oral Q12H Larene Pickett, PA-C      . nicotine (NICODERM CQ - dosed in mg/24 hours) patch 21 mg  21 mg Transdermal Daily Varney Biles, MD   21 mg at 08/04/17 1946  . thiamine (VITAMIN B-1) tablet 100 mg  100 mg Oral Daily Quincy Carnes M, PA-C   100 mg at 08/05/17 1004   Or  . thiamine (B-1) injection 100 mg  100 mg Intravenous Daily Quincy Carnes M, PA-C      . traZODone (DESYREL) tablet 50 mg  50 mg Oral QHS PRN,MR X 1 Laverle Hobby,  PA-C   50 mg at 08/04/17 2315   Current Outpatient Medications  Medication Sig Dispense Refill  . cephALEXin (KEFLEX) 500 MG capsule Take 1 capsule (500 mg total) by mouth 3 (three) times daily. (Patient not taking: Reported on 07/04/2017) 14 capsule 0  . gabapentin (NEURONTIN) 300 MG capsule Take 1 capsule (300 mg total) 4 (four) times daily by mouth. (Patient not taking: Reported on 08/04/2017) 120 capsule 0  . levofloxacin (LEVAQUIN) 750 MG tablet Take 1 tablet (750 mg total) by mouth daily. (Patient not taking: Reported on 07/04/2017) 7 tablet 0  . sertraline (ZOLOFT) 50 MG tablet Take 1 tablet (50 mg total) by mouth daily. (Patient not taking: Reported on 08/04/2017) 30 tablet 0  . traZODone (DESYREL) 50 MG tablet Take 1 tablet (50 mg total) by mouth at bedtime and may repeat dose one time if needed. (Patient not taking: Reported on 08/04/2017) 30 tablet 0    Lab Results:  Results for orders placed or performed during the hospital encounter of 08/04/17 (from the past 48 hour(s))  Comprehensive metabolic panel     Status: None   Collection Time: 08/04/17  4:12 AM  Result Value Ref Range   Sodium 138 135 - 145 mmol/L   Potassium 3.8 3.5 - 5.1 mmol/L   Chloride 104 101 - 111 mmol/L   CO2 25 22 - 32 mmol/L   Glucose, Bld 98 65 - 99 mg/dL   BUN 13 6 - 20 mg/dL   Creatinine, Ser 0.75 0.44 - 1.00 mg/dL   Calcium 9.1 8.9 - 10.3 mg/dL   Total Protein 7.7 6.5 - 8.1 g/dL   Albumin 4.1 3.5 - 5.0 g/dL   AST 24 15 - 41 U/L   ALT 26 14 - 54 U/L   Alkaline Phosphatase 61 38 - 126 U/L   Total Bilirubin 0.3 0.3 - 1.2 mg/dL   GFR calc non Af Amer >60 >60 mL/min   GFR calc Af Amer >60 >60 mL/min    Comment: (NOTE) The eGFR has been calculated using the CKD EPI equation. This calculation has not been validated in all clinical situations. eGFR's persistently <60 mL/min signify possible Chronic Kidney Disease.    Anion gap 9 5 - 15  Ethanol     Status: Abnormal   Collection Time: 08/04/17  4:12  AM  Result Value Ref Range   Alcohol, Ethyl (B) 151 (H) <10 mg/dL    Comment:        LOWEST DETECTABLE LIMIT FOR SERUM ALCOHOL IS 10 mg/dL FOR MEDICAL PURPOSES ONLY   Salicylate level     Status: None   Collection Time: 08/04/17  4:12 AM  Result Value Ref Range   Salicylate Lvl <8.2 2.8 - 30.0 mg/dL  Acetaminophen level     Status: Abnormal   Collection Time: 08/04/17  4:12 AM  Result Value Ref Range   Acetaminophen (Tylenol), Serum <10 (L) 10 - 30 ug/mL    Comment:        THERAPEUTIC CONCENTRATIONS VARY SIGNIFICANTLY. A RANGE OF 10-30 ug/mL MAY BE AN EFFECTIVE CONCENTRATION FOR MANY PATIENTS. HOWEVER, SOME ARE BEST TREATED AT CONCENTRATIONS OUTSIDE THIS RANGE. ACETAMINOPHEN CONCENTRATIONS >150 ug/mL AT 4 HOURS AFTER INGESTION AND >50 ug/mL AT 12 HOURS AFTER INGESTION ARE OFTEN ASSOCIATED WITH TOXIC REACTIONS.   cbc     Status: Abnormal   Collection Time: 08/04/17  4:12 AM  Result Value Ref Range   WBC 6.1 4.0 - 10.5 K/uL   RBC 4.41 3.87 - 5.11 MIL/uL   Hemoglobin 11.5 (L) 12.0 - 15.0 g/dL   HCT 36.2 36.0 - 46.0 %   MCV 82.1 78.0 - 100.0 fL   MCH 26.1 26.0 - 34.0 pg   MCHC 31.8 30.0 - 36.0 g/dL   RDW 16.0 (H) 11.5 - 15.5 %   Platelets 228 150 - 400 K/uL  Rapid urine drug screen (hospital performed)     Status: Abnormal   Collection Time: 08/04/17  4:49 AM  Result Value Ref Range   Opiates NONE DETECTED NONE DETECTED   Cocaine POSITIVE (A) NONE DETECTED   Benzodiazepines NONE DETECTED NONE DETECTED   Amphetamines NONE DETECTED NONE DETECTED   Tetrahydrocannabinol NONE DETECTED NONE DETECTED   Barbiturates NONE DETECTED NONE DETECTED    Comment:        DRUG SCREEN FOR MEDICAL PURPOSES ONLY.  IF CONFIRMATION IS NEEDED FOR ANY PURPOSE, NOTIFY LAB WITHIN 5 DAYS.        LOWEST DETECTABLE LIMITS FOR URINE DRUG SCREEN Drug Class       Cutoff (ng/mL) Amphetamine      1000 Barbiturate      200 Benzodiazepine   676 Tricyclics       720 Opiates           300 Cocaine          300 THC              50   I-Stat beta hCG blood, ED     Status: None   Collection Time: 08/04/17  5:04 AM  Result Value Ref Range   I-stat hCG, quantitative <5.0 <5 mIU/mL   Comment 3            Comment:   GEST. AGE      CONC.  (mIU/mL)   <=1 WEEK        5 - 50     2 WEEKS       50 - 500     3 WEEKS       100 - 10,000     4 WEEKS     1,000 - 30,000        FEMALE AND NON-PREGNANT FEMALE:     LESS THAN 5 mIU/mL     Blood Alcohol level:  Lab Results  Component Value Date   ETH 151 (H) 08/04/2017   ETH 73 (H) 07/04/2017      Musculoskeletal: Strength & Muscle Tone: within normal limits Gait & Station: normal Patient leans: N/A  Psychiatric Specialty Exam: Physical Exam  Nursing note and vitals reviewed. Constitutional: She is oriented to person, place, and time. She appears well-developed and well-nourished.  HENT:  Head: Normocephalic and atraumatic.  Neck: Normal range of motion.  Respiratory: Effort normal.  Musculoskeletal: Normal range of motion.  Neurological: She is alert and  oriented to person, place, and time.  Skin: No rash noted.  Psychiatric: She has a normal mood and affect. Her speech is normal and behavior is normal. Judgment and thought content normal. Cognition and memory are normal.    Review of Systems  Constitutional: Positive for malaise/fatigue.  Neurological: Positive for headaches.  Psychiatric/Behavioral: Positive for depression. Negative for substance abuse and suicidal ideas. The patient is nervous/anxious.   All other systems reviewed and are negative.   Blood pressure 135/88, pulse 69, temperature 98.3 F (36.8 C), temperature source Oral, resp. rate 18, height '5\' 2"'  (1.575 m), weight 106.6 kg (235 lb), last menstrual period 06/30/2017, SpO2 100 %.Body mass index is 42.98 kg/m.  General Appearance: Casual  Eye Contact:  Fair  Speech:  Clear and Coherent  Volume:  Normal  Mood:  Anxious  Affect:  Congruent   Thought Process:  Coherent  Orientation:  Full (Time, Place, and Person)  Thought Content:  Hallucinations: None  Suicidal Thoughts:  No  Homicidal Thoughts:  No  Memory:  Immediate;   Fair Recent;   Fair Remote;   Fair  Judgement:  Fair  Insight:  Fair  Psychomotor Activity:  Normal  Concentration:  Concentration: Fair  Recall:  [employer] of Knowledge:  Fair  Language:  Good  Akathisia:  No  Handed:  Right  AIMS (if indicated):   N/A  Assets:  Communication Skills Desire for Improvement Physical Health Resilience Social Support  ADL's:  Intact  Cognition:  WNL  Sleep:   N/A     Treatment Plan Summary: Daily contact with patient to assess and evaluate symptoms and progress in treatment and Medication management   Continue Ativan Detox protocol/ CIWA -Peer Support provided a bed at [facility]/ Bern[facility] Sages patient to be discharged tomorrow at 10:00 am.   Derrill Center, NP 08/05/2017, 1:25 PM   Patient seen face-to-face for psychiatric evaluation, chart reviewed and case discussed with the physician extender and developed treatment plan. Reviewed the information documented and agree with the treatment plan.  Buford Dresser, DO

## 2017-08-05 NOTE — ED Notes (Signed)
Pt A&O x 3, no distress noted, calm & cooperative. Resting at present.  Monitoring for safety, Q 15 min checks in effect. 

## 2017-08-05 NOTE — Patient Outreach (Signed)
[facility] was accepted into [facility] for detox/substance use treatment. Patient needs to meet with the intake department tomorrow at 10:00 am. Patient needs [facility] for transportation tomorrow to [facility] at around 8:30am. The accepting physician is Dr. Loel LoftyJohn Faulkner. The number to call for report is [phone] or [phone].

## 2017-08-06 NOTE — ED Provider Notes (Signed)
RN/BH team indicates patient accepted to [facility] for further evaluation/tx, Dr Karie SodaFaulker accepting.   Patient is awake and alert, nad. Vitals:   08/05/17 2201 08/06/17 0634  BP: 135/81 118/67  Pulse: 84 67  Resp:  18  Temp:  98.4 F (36.9 C)  SpO2:  100%   Pt appears stable for transfer.      Cathren LaineSteinl, Allena Pietila, MD 08/06/17 [phone]

## 2017-08-06 NOTE — Discharge Instructions (Signed)
To [facility].

## 2017-09-01 ENCOUNTER — Other Ambulatory Visit: Payer: Self-pay

## 2017-09-01 ENCOUNTER — Encounter (HOSPITAL_COMMUNITY): Payer: Self-pay | Admitting: Emergency Medicine

## 2017-09-01 ENCOUNTER — Emergency Department (HOSPITAL_COMMUNITY)
Admission: EM | Admit: 2017-09-01 | Discharge: 2017-09-01 | Disposition: A | Discharge status: Other Acute Inpatient Hospital | Payer: Medicaid - Out of State | Attending: Emergency Medicine | Admitting: Emergency Medicine

## 2017-09-01 DIAGNOSIS — I10 Essential (primary) hypertension: Secondary | ICD-10-CM | POA: Insufficient documentation

## 2017-09-01 DIAGNOSIS — Z915 Personal history of self-harm: Secondary | ICD-10-CM | POA: Diagnosis not present

## 2017-09-01 DIAGNOSIS — F1910 Other psychoactive substance abuse, uncomplicated: Secondary | ICD-10-CM

## 2017-09-01 DIAGNOSIS — F1490 Cocaine use, unspecified, uncomplicated: Secondary | ICD-10-CM | POA: Diagnosis not present

## 2017-09-01 DIAGNOSIS — R45851 Suicidal ideations: Secondary | ICD-10-CM | POA: Diagnosis not present

## 2017-09-01 DIAGNOSIS — F419 Anxiety disorder, unspecified: Secondary | ICD-10-CM

## 2017-09-01 DIAGNOSIS — Z6379 Other stressful life events affecting family and household: Secondary | ICD-10-CM

## 2017-09-01 DIAGNOSIS — F1290 Cannabis use, unspecified, uncomplicated: Secondary | ICD-10-CM

## 2017-09-01 DIAGNOSIS — F332 Major depressive disorder, recurrent severe without psychotic features: Secondary | ICD-10-CM

## 2017-09-01 DIAGNOSIS — G4700 Insomnia, unspecified: Secondary | ICD-10-CM

## 2017-09-01 DIAGNOSIS — F450 Somatization disorder: Secondary | ICD-10-CM

## 2017-09-01 DIAGNOSIS — D649 Anemia, unspecified: Secondary | ICD-10-CM | POA: Insufficient documentation

## 2017-09-01 DIAGNOSIS — F1099 Alcohol use, unspecified with unspecified alcohol-induced disorder: Secondary | ICD-10-CM

## 2017-09-01 DIAGNOSIS — F10920 Alcohol use, unspecified with intoxication, uncomplicated: Secondary | ICD-10-CM | POA: Insufficient documentation

## 2017-09-01 DIAGNOSIS — F1410 Cocaine abuse, uncomplicated: Secondary | ICD-10-CM | POA: Insufficient documentation

## 2017-09-01 DIAGNOSIS — F17210 Nicotine dependence, cigarettes, uncomplicated: Secondary | ICD-10-CM

## 2017-09-01 DIAGNOSIS — Y906 Blood alcohol level of 120-199 mg/100 ml: Secondary | ICD-10-CM | POA: Insufficient documentation

## 2017-09-01 LAB — COMPREHENSIVE METABOLIC PANEL
ALBUMIN: 3.4 g/dL — AB (ref 3.5–5.0)
ALK PHOS: 51 U/L (ref 38–126)
ALT: 18 U/L (ref 14–54)
AST: 20 U/L (ref 15–41)
Anion gap: 11 (ref 5–15)
BILIRUBIN TOTAL: 0.4 mg/dL (ref 0.3–1.2)
BUN: 17 mg/dL (ref 6–20)
CALCIUM: 8.5 mg/dL — AB (ref 8.9–10.3)
CO2: 19 mmol/L — ABNORMAL LOW (ref 22–32)
CREATININE: 1.18 mg/dL — AB (ref 0.44–1.00)
Chloride: 104 mmol/L (ref 101–111)
GFR calc Af Amer: >60 mL/min (ref >60)
GLUCOSE: 111 mg/dL — AB (ref 65–99)
Potassium: 4 mmol/L (ref 3.5–5.1)
Sodium: 134 mmol/L — ABNORMAL LOW (ref 135–145)
TOTAL PROTEIN: 6.3 g/dL — AB (ref 6.5–8.1)

## 2017-09-01 LAB — RAPID URINE DRUG SCREEN, HOSP PERFORMED
Amphetamines: NOT DETECTED
BENZODIAZEPINES: NOT DETECTED
Barbiturates: NOT DETECTED
Cocaine: POSITIVE — AB
Opiates: NOT DETECTED
Tetrahydrocannabinol: NOT DETECTED

## 2017-09-01 LAB — CBC
HEMATOCRIT: 34.3 % — AB (ref 36.0–46.0)
Hemoglobin: 10.8 g/dL — ABNORMAL LOW (ref 12.0–15.0)
MCH: 25.8 pg — AB (ref 26.0–34.0)
MCHC: 31.5 g/dL (ref 30.0–36.0)
MCV: 81.9 fL (ref 78.0–100.0)
PLATELETS: 283 10*3/uL (ref 150–400)
RBC: 4.19 MIL/uL (ref 3.87–5.11)
RDW: 16 % — AB (ref 11.5–15.5)
WBC: 8.8 10*3/uL (ref 4.0–10.5)

## 2017-09-01 LAB — I-STAT BETA HCG BLOOD, ED (MC, WL, AP ONLY)

## 2017-09-01 LAB — SALICYLATE LEVEL: Salicylate Lvl: <7 mg/dL (ref 2.8–30.0)

## 2017-09-01 LAB — ETHANOL: ALCOHOL ETHYL (B): 149 mg/dL — AB (ref <10)

## 2017-09-01 LAB — ACETAMINOPHEN LEVEL: Acetaminophen (Tylenol), Serum: <10 ug/mL — ABNORMAL LOW (ref 10–30)

## 2017-09-01 MED ORDER — ALUM & MAG HYDROXIDE-SIMETH 200-200-20 MG/5ML PO SUSP: 30.0000 mL | Freq: Four times a day (QID) | ORAL | Status: DC | PRN

## 2017-09-01 MED ORDER — LORAZEPAM 2 MG/ML IJ SOLN: 0.0000 mg | Freq: Four times a day (QID) | INTRAMUSCULAR | Status: DC

## 2017-09-01 MED ORDER — SERTRALINE HCL 50 MG PO TABS
50.0000 mg | ORAL_TABLET | Freq: Every day | ORAL | Status: DC
  Administered 2017-09-01: 50 mg via ORAL
  Filled 2017-09-01: qty 1

## 2017-09-01 MED ORDER — LORAZEPAM 1 MG PO TABS: 0.0000 mg | ORAL_TABLET | Freq: Two times a day (BID) | ORAL | Status: DC

## 2017-09-01 MED ORDER — LORAZEPAM 2 MG/ML IJ SOLN: 0.0000 mg | Freq: Two times a day (BID) | INTRAMUSCULAR | Status: DC

## 2017-09-01 MED ORDER — NICOTINE 14 MG/24HR TD PT24
14.0000 mg | MEDICATED_PATCH | Freq: Every day | TRANSDERMAL | Status: DC
  Administered 2017-09-01: 14 mg via TRANSDERMAL
  Filled 2017-09-01: qty 1

## 2017-09-01 MED ORDER — ONDANSETRON HCL 4 MG PO TABS
4.0000 mg | ORAL_TABLET | Freq: Three times a day (TID) | ORAL | Status: DC | PRN
  Administered 2017-09-01: 4 mg via ORAL
  Filled 2017-09-01: qty 1

## 2017-09-01 MED ORDER — ACETAMINOPHEN 325 MG PO TABS
650.0000 mg | ORAL_TABLET | Freq: Once | ORAL | Status: AC
Start: 2017-09-01 — End: 2017-09-01
  Administered 2017-09-01: 650 mg via ORAL
  Filled 2017-09-01: qty 2

## 2017-09-01 MED ORDER — IBUPROFEN 400 MG PO TABS
600.0000 mg | ORAL_TABLET | Freq: Three times a day (TID) | ORAL | Status: DC | PRN
  Administered 2017-09-01: 600 mg via ORAL
  Filled 2017-09-01 (×2): qty 1

## 2017-09-01 MED ORDER — THIAMINE HCL 100 MG/ML IJ SOLN: 100.0000 mg | Freq: Every day | INTRAMUSCULAR | Status: DC

## 2017-09-01 MED ORDER — TRAZODONE HCL 50 MG PO TABS: 50.0000 mg | ORAL_TABLET | Freq: Every evening | ORAL | Status: DC | PRN

## 2017-09-01 MED ORDER — LORAZEPAM 1 MG PO TABS
0.0000 mg | ORAL_TABLET | Freq: Four times a day (QID) | ORAL | Status: DC
  Administered 2017-09-01: 1 mg via ORAL
  Administered 2017-09-01: 2 mg via ORAL
  Filled 2017-09-01: qty 2
  Filled 2017-09-01: qty 1

## 2017-09-01 MED ORDER — VITAMIN B-1 100 MG PO TABS
100.0000 mg | ORAL_TABLET | Freq: Every day | ORAL | Status: DC
  Administered 2017-09-01: 100 mg via ORAL
  Filled 2017-09-01: qty 1

## 2017-09-01 NOTE — Progress Notes (Signed)
Patient meets criteria for inpatient treatment. CSW faxed referrals to the following inpatient facilities for review:  [facility], [facility], [facility], [facility], [address], [address], [facility], [facility], [facility], [facility], [facility], [facility]   TTS will continue to seek bed placement.   Baldo DaubJolan Thermon Zulauf, MSW, LCSWA Clinical Social Worker (Disposition) [facility]  [phone]
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
## 2017-09-01 NOTE — ED Notes (Signed)
Wanded by security at this time. Staffing notified for sitter, none available at this time.
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
## 2017-09-01 NOTE — ED Notes (Signed)
belonging in locker #3

## 2017-09-01 NOTE — ED Triage Notes (Addendum)
Pt reports depression x3 weeks. Reports suicidal ideation with plan, reports attempted to run out in front of traffic today. Reports alcohol abuse. Reports drinking beer up until 30 minutes ago - reports a case of beer a day and some liquor.

## 2017-09-01 NOTE — ED Notes (Signed)
Pt taken to shower by sitter 

## 2017-09-01 NOTE — Progress Notes (Signed)
Accepted [facility] [place]   Accepting and attending physician is Dr. Tonita PhoenixKomissarova Patient is going to the [facility] unit, room 247-2.  Nurse to Nurse report number is [phone] The bed is ready, patient can transport at anytime.  Kathlene NovemberMike, RN notified.    Baldo DaubJolan Cheree Fowles, MSW, LCSWA Clinical Social Worker (Disposition) [facility]  [phone]

## 2017-09-01 NOTE — ED Notes (Signed)
TTS being done at bedside 
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
## 2018-02-01 ENCOUNTER — Ambulatory Visit (HOSPITAL_COMMUNITY)
Admission: EM | Admit: 2018-02-01 | Discharge: 2018-02-01 | Disposition: A | Discharge status: Home / Self Care | Payer: No Typology Code available for payment source | Source: Ambulatory Visit | Attending: Emergency Medicine | Admitting: Emergency Medicine

## 2018-02-01 ENCOUNTER — Emergency Department (HOSPITAL_COMMUNITY)
Admission: EM | Admit: 2018-02-01 | Discharge: 2018-02-01 | Disposition: A | Discharge status: Other Acute Inpatient Hospital | Payer: Medicaid - Out of State | Attending: Emergency Medicine | Admitting: Emergency Medicine

## 2018-02-01 ENCOUNTER — Encounter (HOSPITAL_COMMUNITY): Payer: Self-pay | Admitting: Emergency Medicine

## 2018-02-01 ENCOUNTER — Encounter (HOSPITAL_COMMUNITY): Payer: Self-pay

## 2018-02-01 ENCOUNTER — Inpatient Hospital Stay (HOSPITAL_COMMUNITY)
Admission: AD | Admit: 2018-02-01 | Discharge: 2018-02-07 | DRG: 885 | Disposition: A | Discharge status: Home / Self Care | Payer: No Typology Code available for payment source | Source: Intra-hospital | Attending: Psychiatry | Admitting: Psychiatry

## 2018-02-01 ENCOUNTER — Other Ambulatory Visit: Payer: Self-pay

## 2018-02-01 DIAGNOSIS — F1420 Cocaine dependence, uncomplicated: Secondary | ICD-10-CM | POA: Diagnosis not present

## 2018-02-01 DIAGNOSIS — Z79899 Other long term (current) drug therapy: Secondary | ICD-10-CM

## 2018-02-01 DIAGNOSIS — Z0441 Encounter for examination and observation following alleged adult rape: Secondary | ICD-10-CM | POA: Insufficient documentation

## 2018-02-01 DIAGNOSIS — Z818 Family history of other mental and behavioral disorders: Secondary | ICD-10-CM | POA: Diagnosis not present

## 2018-02-01 DIAGNOSIS — F419 Anxiety disorder, unspecified: Secondary | ICD-10-CM | POA: Diagnosis present

## 2018-02-01 DIAGNOSIS — F17210 Nicotine dependence, cigarettes, uncomplicated: Secondary | ICD-10-CM | POA: Diagnosis present

## 2018-02-01 DIAGNOSIS — G4700 Insomnia, unspecified: Secondary | ICD-10-CM | POA: Diagnosis present

## 2018-02-01 DIAGNOSIS — T465X2A Poisoning by other antihypertensive drugs, intentional self-harm, initial encounter: Secondary | ICD-10-CM | POA: Diagnosis present

## 2018-02-01 DIAGNOSIS — I10 Essential (primary) hypertension: Secondary | ICD-10-CM | POA: Diagnosis present

## 2018-02-01 DIAGNOSIS — F1020 Alcohol dependence, uncomplicated: Secondary | ICD-10-CM | POA: Diagnosis present

## 2018-02-01 DIAGNOSIS — T7421XA Adult sexual abuse, confirmed, initial encounter: Secondary | ICD-10-CM

## 2018-02-01 DIAGNOSIS — F332 Major depressive disorder, recurrent severe without psychotic features: Principal | ICD-10-CM | POA: Diagnosis present

## 2018-02-01 DIAGNOSIS — K219 Gastro-esophageal reflux disease without esophagitis: Secondary | ICD-10-CM | POA: Diagnosis present

## 2018-02-01 DIAGNOSIS — R45851 Suicidal ideations: Secondary | ICD-10-CM | POA: Insufficient documentation

## 2018-02-01 DIAGNOSIS — B1920 Unspecified viral hepatitis C without hepatic coma: Secondary | ICD-10-CM | POA: Diagnosis present

## 2018-02-01 DIAGNOSIS — Y903 Blood alcohol level of 60-79 mg/100 ml: Secondary | ICD-10-CM | POA: Diagnosis present

## 2018-02-01 DIAGNOSIS — Z91410 Personal history of adult physical and sexual abuse: Secondary | ICD-10-CM | POA: Diagnosis not present

## 2018-02-01 DIAGNOSIS — Z888 Allergy status to other drugs, medicaments and biological substances status: Secondary | ICD-10-CM | POA: Diagnosis not present

## 2018-02-01 DIAGNOSIS — F4310 Post-traumatic stress disorder, unspecified: Secondary | ICD-10-CM | POA: Diagnosis present

## 2018-02-01 DIAGNOSIS — T50902A Poisoning by unspecified drugs, medicaments and biological substances, intentional self-harm, initial encounter: Secondary | ICD-10-CM

## 2018-02-01 DIAGNOSIS — Z915 Personal history of self-harm: Secondary | ICD-10-CM

## 2018-02-01 DIAGNOSIS — F1024 Alcohol dependence with alcohol-induced mood disorder: Secondary | ICD-10-CM

## 2018-02-01 DIAGNOSIS — F1410 Cocaine abuse, uncomplicated: Secondary | ICD-10-CM | POA: Insufficient documentation

## 2018-02-01 LAB — I-STAT BETA HCG BLOOD, ED (MC, WL, AP ONLY)

## 2018-02-01 LAB — ACETAMINOPHEN LEVEL

## 2018-02-01 LAB — COMPREHENSIVE METABOLIC PANEL
ALK PHOS: 38 U/L (ref 38–126)
ALT: 19 U/L (ref 14–54)
ANION GAP: 11 (ref 5–15)
AST: 18 U/L (ref 15–41)
Albumin: 3.3 g/dL — ABNORMAL LOW (ref 3.5–5.0)
BUN: 11 mg/dL (ref 6–20)
CALCIUM: 8.4 mg/dL — AB (ref 8.9–10.3)
CHLORIDE: 108 mmol/L (ref 101–111)
CO2: 20 mmol/L — ABNORMAL LOW (ref 22–32)
CREATININE: 0.94 mg/dL (ref 0.44–1.00)
Glucose, Bld: 99 mg/dL (ref 65–99)
Potassium: 3.4 mmol/L — ABNORMAL LOW (ref 3.5–5.1)
SODIUM: 139 mmol/L (ref 135–145)
Total Bilirubin: 0.3 mg/dL (ref 0.3–1.2)
Total Protein: 6.3 g/dL — ABNORMAL LOW (ref 6.5–8.1)

## 2018-02-01 LAB — CBC
HCT: 33.5 % — ABNORMAL LOW (ref 36.0–46.0)
Hemoglobin: 10.1 g/dL — ABNORMAL LOW (ref 12.0–15.0)
MCH: 24.6 pg — AB (ref 26.0–34.0)
MCHC: 30.1 g/dL (ref 30.0–36.0)
MCV: 81.7 fL (ref 78.0–100.0)
PLATELETS: 263 10*3/uL (ref 150–400)
RBC: 4.1 MIL/uL (ref 3.87–5.11)
RDW: 17.8 % — ABNORMAL HIGH (ref 11.5–15.5)
WBC: 4.5 10*3/uL (ref 4.0–10.5)

## 2018-02-01 LAB — RAPID URINE DRUG SCREEN, HOSP PERFORMED
Amphetamines: NOT DETECTED
Benzodiazepines: NOT DETECTED
Cocaine: POSITIVE — AB
Opiates: NOT DETECTED
Tetrahydrocannabinol: NOT DETECTED

## 2018-02-01 LAB — RAPID HIV SCREEN (HIV 1/2 AB+AG)
HIV 1/2 ANTIBODIES: NONREACTIVE
HIV-1 P24 ANTIGEN - HIV24: NONREACTIVE

## 2018-02-01 LAB — ETHANOL: Alcohol, Ethyl (B): 60 mg/dL — ABNORMAL HIGH (ref <10)

## 2018-02-01 LAB — SALICYLATE LEVEL: Salicylate Lvl: <7 mg/dL (ref 2.8–30.0)

## 2018-02-01 MED ORDER — NICOTINE 21 MG/24HR TD PT24
21.0000 mg | MEDICATED_PATCH | Freq: Every day | TRANSDERMAL | Status: DC
  Administered 2018-02-01: 21 mg via TRANSDERMAL
  Filled 2018-02-01: qty 1

## 2018-02-01 MED ORDER — ELVITEG-COBIC-EMTRICIT-TENOFAF 150-150-200-10 MG PO TABS: 1.0000 | ORAL_TABLET | Freq: Every day | ORAL | 0 refills | Status: DC

## 2018-02-01 MED ORDER — LORAZEPAM 1 MG PO TABS: 0.0000 mg | ORAL_TABLET | Freq: Two times a day (BID) | ORAL | Status: DC

## 2018-02-01 MED ORDER — LORAZEPAM 1 MG PO TABS
0.0000 mg | ORAL_TABLET | Freq: Four times a day (QID) | ORAL | Status: DC
  Administered 2018-02-01: 2 mg via ORAL
  Administered 2018-02-01: 1 mg via ORAL
  Filled 2018-02-01: qty 2
  Filled 2018-02-01: qty 1

## 2018-02-01 MED ORDER — ADULT MULTIVITAMIN W/MINERALS CH
1.0000 | ORAL_TABLET | Freq: Every day | ORAL | Status: DC
  Administered 2018-02-02 – 2018-02-07 (×6): 1 via ORAL
  Filled 2018-02-01 (×9): qty 1

## 2018-02-01 MED ORDER — VITAMIN B-1 100 MG PO TABS
100.0000 mg | ORAL_TABLET | Freq: Every day | ORAL | Status: DC
  Administered 2018-02-01: 100 mg via ORAL
  Filled 2018-02-01: qty 1

## 2018-02-01 MED ORDER — LORAZEPAM 2 MG/ML IJ SOLN: 0.0000 mg | Freq: Two times a day (BID) | INTRAMUSCULAR | Status: DC

## 2018-02-01 MED ORDER — LORAZEPAM 2 MG/ML IJ SOLN: 0.0000 mg | Freq: Four times a day (QID) | INTRAMUSCULAR | Status: DC

## 2018-02-01 MED ORDER — THIAMINE HCL 100 MG/ML IJ SOLN: 100.0000 mg | Freq: Every day | INTRAMUSCULAR | Status: DC

## 2018-02-01 MED ORDER — ELVITEG-COBIC-EMTRICIT-TENOFAF 150-150-200-10 MG PO TABS
1.0000 | ORAL_TABLET | Freq: Every day | ORAL | Status: DC
  Administered 2018-02-01: 1 via ORAL
  Filled 2018-02-01: qty 5

## 2018-02-01 MED ORDER — ONDANSETRON 4 MG PO TBDP
4.0000 mg | ORAL_TABLET | Freq: Four times a day (QID) | ORAL | Status: AC | PRN
  Administered 2018-02-01 – 2018-02-04 (×7): 4 mg via ORAL
  Filled 2018-02-01 (×7): qty 1

## 2018-02-01 MED ORDER — METRONIDAZOLE 500 MG PO TABS: 2000.0000 mg | ORAL_TABLET | Freq: Once | ORAL | Status: DC

## 2018-02-01 MED ORDER — LORAZEPAM 1 MG PO TABS
1.0000 mg | ORAL_TABLET | Freq: Four times a day (QID) | ORAL | Status: AC
  Administered 2018-02-01 – 2018-02-03 (×6): 1 mg via ORAL
  Filled 2018-02-01 (×6): qty 1

## 2018-02-01 MED ORDER — LOPERAMIDE HCL 2 MG PO CAPS: 2.0000 mg | ORAL_CAPSULE | ORAL | Status: AC | PRN

## 2018-02-01 MED ORDER — ACETAMINOPHEN 325 MG PO TABS
650.0000 mg | ORAL_TABLET | Freq: Four times a day (QID) | ORAL | Status: DC | PRN
  Administered 2018-02-02: 650 mg via ORAL
  Filled 2018-02-01: qty 2

## 2018-02-01 MED ORDER — ELVITEG-COBIC-EMTRICIT-TENOFAF 150-150-200-10 MG PO TABS
1.0000 | ORAL_TABLET | Freq: Every day | ORAL | Status: AC
  Administered 2018-02-02 – 2018-02-05 (×4): 1 via ORAL
  Filled 2018-02-01 (×4): qty 1

## 2018-02-01 MED ORDER — MAGNESIUM HYDROXIDE 400 MG/5ML PO SUSP: 30.0000 mL | Freq: Every day | ORAL | Status: DC | PRN

## 2018-02-01 MED ORDER — ACETAMINOPHEN 500 MG PO TABS
1000.0000 mg | ORAL_TABLET | Freq: Once | ORAL | Status: AC
Start: 2018-02-01 — End: 2018-02-01
  Administered 2018-02-01: 1000 mg via ORAL
  Filled 2018-02-01: qty 2

## 2018-02-01 MED ORDER — LORAZEPAM 1 MG PO TABS
1.0000 mg | ORAL_TABLET | Freq: Three times a day (TID) | ORAL | Status: AC
  Administered 2018-02-03 – 2018-02-04 (×3): 1 mg via ORAL
  Filled 2018-02-01 (×3): qty 1

## 2018-02-01 MED ORDER — VITAMIN B-1 100 MG PO TABS
100.0000 mg | ORAL_TABLET | Freq: Every day | ORAL | Status: DC
  Administered 2018-02-02 – 2018-02-07 (×6): 100 mg via ORAL
  Filled 2018-02-01 (×8): qty 1

## 2018-02-01 MED ORDER — HYDROXYZINE HCL 25 MG PO TABS: 25.0000 mg | ORAL_TABLET | Freq: Four times a day (QID) | ORAL | Status: DC | PRN

## 2018-02-01 MED ORDER — IBUPROFEN 800 MG PO TABS
800.0000 mg | ORAL_TABLET | Freq: Once | ORAL | Status: AC
  Administered 2018-02-01: 800 mg via ORAL
  Filled 2018-02-01: qty 1

## 2018-02-01 MED ORDER — LIDOCAINE 5 % EX PTCH
1.0000 | MEDICATED_PATCH | Freq: Every day | CUTANEOUS | Status: DC
  Administered 2018-02-02 – 2018-02-07 (×6): 1 via TRANSDERMAL
  Filled 2018-02-01 (×7): qty 1

## 2018-02-01 MED ORDER — LORAZEPAM 1 MG PO TABS
1.0000 mg | ORAL_TABLET | Freq: Four times a day (QID) | ORAL | Status: AC | PRN
  Administered 2018-02-03 – 2018-02-04 (×2): 1 mg via ORAL
  Filled 2018-02-01 (×2): qty 1

## 2018-02-01 MED ORDER — ALUM & MAG HYDROXIDE-SIMETH 200-200-20 MG/5ML PO SUSP: 30.0000 mL | ORAL | Status: DC | PRN

## 2018-02-01 MED ORDER — TRAZODONE HCL 50 MG PO TABS
50.0000 mg | ORAL_TABLET | Freq: Every evening | ORAL | Status: DC | PRN
  Administered 2018-02-01 – 2018-02-06 (×6): 50 mg via ORAL
  Filled 2018-02-01 (×4): qty 1
  Filled 2018-02-01: qty 7
  Filled 2018-02-01 (×2): qty 1

## 2018-02-01 MED ORDER — LORAZEPAM 1 MG PO TABS
1.0000 mg | ORAL_TABLET | Freq: Every day | ORAL | Status: AC
  Administered 2018-02-06: 1 mg via ORAL
  Filled 2018-02-01: qty 1

## 2018-02-01 MED ORDER — LORAZEPAM 1 MG PO TABS
1.0000 mg | ORAL_TABLET | Freq: Two times a day (BID) | ORAL | Status: AC
  Administered 2018-02-04 – 2018-02-05 (×2): 1 mg via ORAL
  Filled 2018-02-01 (×2): qty 1

## 2018-02-01 MED ORDER — HYDROXYZINE HCL 25 MG PO TABS
25.0000 mg | ORAL_TABLET | Freq: Three times a day (TID) | ORAL | Status: DC | PRN
  Administered 2018-02-01: 25 mg via ORAL
  Filled 2018-02-01: qty 1

## 2018-02-01 MED FILL — GENVOYA TABLET: 150-150-200 | 23 days supply | Qty: 23 | Fill #0

## 2018-02-01 NOTE — ED Notes (Signed)
Per pt, while at [facility] staff preformed and in and out cath on pt.  Will inform SANE RN.
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
## 2018-02-01 NOTE — ED Notes (Signed)
Per Duwayne Heckanielle from poison control pt is medically cleared.

## 2018-02-01 NOTE — ED Triage Notes (Signed)
Sane nurse gave Pts meds
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
## 2018-02-01 NOTE — ED Notes (Signed)
Pt wanded by security. 

## 2018-02-01 NOTE — ED Notes (Signed)
SANE nurse at pt bedside 

## 2018-02-01 NOTE — ED Triage Notes (Signed)
Pt received a call from Mother on Staff phone. Pt reported she did not feel like talking and wanted mother to call back later in the day. This information was given to Mother.

## 2018-02-01 NOTE — ED Notes (Signed)
Ordered breakfast tray  
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
## 2018-02-01 NOTE — ED Notes (Signed)
[facility]

## 2018-02-01 NOTE — Progress Notes (Signed)
[facility]  Susy Frizzleamika Lewis, NP is the accepting provider. Dr. Jola Babinskilary is the attending provider.  Call report to [phone]. [email or web address] notified.  Pt is voluntary and can be transported by [facility].  [facility].   Wells GuilesSarah Elba Dendinger, LCSW, LCAS Disposition CSW [facility]/TT[facility] [phone]
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
## 2018-02-01 NOTE — ED Notes (Signed)
ED transfer consent completed via e signature
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
## 2018-02-01 NOTE — ED Notes (Signed)
Saltine crackers given to help with nausua

## 2018-02-01 NOTE — ED Notes (Signed)
[facility]

## 2018-02-02 DIAGNOSIS — F1420 Cocaine dependence, uncomplicated: Secondary | ICD-10-CM

## 2018-02-02 DIAGNOSIS — F1024 Alcohol dependence with alcohol-induced mood disorder: Secondary | ICD-10-CM

## 2018-02-02 DIAGNOSIS — F332 Major depressive disorder, recurrent severe without psychotic features: Principal | ICD-10-CM

## 2018-02-02 DIAGNOSIS — F1020 Alcohol dependence, uncomplicated: Secondary | ICD-10-CM

## 2018-02-02 LAB — HEPATITIS C ANTIBODY: HCV Ab: >11 s/co ratio — ABNORMAL HIGH (ref 0.0–0.9)

## 2018-02-02 LAB — TSH: TSH: 7.067 u[IU]/mL — ABNORMAL HIGH (ref 0.350–4.500)

## 2018-02-02 LAB — RPR: RPR: NONREACTIVE

## 2018-02-02 LAB — HEPATITIS B SURFACE ANTIGEN: HEP B S AG: NEGATIVE

## 2018-02-02 MED ORDER — NICOTINE 21 MG/24HR TD PT24
21.0000 mg | MEDICATED_PATCH | Freq: Every day | TRANSDERMAL | Status: DC
  Administered 2018-02-02 – 2018-02-07 (×6): 21 mg via TRANSDERMAL
  Filled 2018-02-02 (×9): qty 1

## 2018-02-02 MED ORDER — HYDROXYZINE HCL 50 MG PO TABS
50.0000 mg | ORAL_TABLET | Freq: Four times a day (QID) | ORAL | Status: DC | PRN
Start: 2018-02-02 — End: 2018-02-07
  Administered 2018-02-02 – 2018-02-07 (×9): 50 mg via ORAL
  Filled 2018-02-02: qty 10
  Filled 2018-02-02 (×9): qty 1

## 2018-02-02 MED ORDER — IBUPROFEN 800 MG PO TABS
800.0000 mg | ORAL_TABLET | Freq: Four times a day (QID) | ORAL | Status: DC | PRN
Start: 2018-02-02 — End: 2018-02-07
  Administered 2018-02-02 – 2018-02-07 (×12): 800 mg via ORAL
  Filled 2018-02-02 (×12): qty 1

## 2018-02-02 NOTE — Progress Notes (Signed)
DAR NOTE: Pt present with flat affect and depressed mood in the unit. Pt stayed in the bed most of the time, complained n/v, back pain and just feeling sick., Took all her meds as scheduled. As per self inventory, pt had a fair night sleep, fair appetite, low energy, and poor concentration. Pt rate depression at 8, hopeless ness at 8, and anxiety at 8. Pt's safety ensured with 15 minute and environmental checks. Pt currently denies SI/HI and A/V hallucinations. Pt verbally agrees to seek staff if SI/HI or A/VH occurs and to consult with staff before acting on these thoughts. Will continue POC.

## 2018-02-02 NOTE — Tx Team (Signed)
Initial Treatment Plan 02/02/2018 12:05 AM Shirley IonMinnie Howald [id number]    PATIENT STRESSORS: Financial difficulties Medication change or noncompliance Substance abuse Traumatic event   PATIENT STRENGTHS: Wellsite geologistCommunication skills General fund of knowledge Motivation for treatment/growth Work skills   PATIENT IDENTIFIED PROBLEMS: Depression  Suicidal ideation  Substance abuse  "I need to detox completely"  "Not lose my job"  "Stay sober"  "I need counseling to help deal with my emotions"         DISCHARGE CRITERIA:  Improved stabilization in mood, thinking, and/or behavior Verbal commitment to aftercare and medication compliance Withdrawal symptoms are absent or subacute and managed without 24-hour nursing intervention  PRELIMINARY DISCHARGE PLAN: Outpatient therapy Medication management  PATIENT/FAMILY INVOLVEMENT: This treatment plan has been presented to and reviewed with the patient, Shirley Murphy.  The patient and family have been given the opportunity to ask questions and make suggestions.  Levin BaconHeather V Kaliyan Osbourn, RN 02/02/2018, 12:05 AM
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
## 2018-02-02 NOTE — B[facility] Suicide Risk Assessment (Signed)
[facility] INPATIENT:  Family/Significant Other Suicide Prevention Education  Suicide Prevention Education:  Patient Refusal for Family/Significant Other Suicide Prevention Education: The patient Shirley Murphy has refused to provide written consent for family/significant other to be provided Family/Significant Other Suicide Prevention Education during admission and/or prior to discharge.  Physician notified.  SPE completed with pt, as pt refused to consent to family contact. SPI pamphlet provided to pt and pt was encouraged to share information with support network, ask questions, and talk about any concerns relating to SPE. Pt denies access to guns/firearms and verbalized understanding of information provided. Mobile Crisis information also provided to pt.   Rona RavensHeather S Kinsley Nicklaus LCSW 02/02/2018, 2:07 PM
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
## 2018-02-02 NOTE — [facility] Group Notes (Signed)
[facility]02[facility]2018 1:15pm  Type of Therapy: [facility] Presentation  Participation Level: Pt invited. Chose to remain in bed.   Rona RavensHeather S Eion Timbrook, LCSW 02/02/2018 2:47 PM
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
## 2018-02-02 NOTE — Progress Notes (Signed)
Pt has been in bed all evening except to get up to take her medications and eat a snack.  She contracts for [employer] with writer.  She denies HI/AVH.  She focuses on somatic complaints and requested multiple prn meds this evening.  Pt makes her needs known to staff.  She was medicated per orders.  Support and encouragement offered.  Discharge plans are in process.  Safety maintained with q15 minute checks.
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
## 2018-02-03 MED ORDER — PANTOPRAZOLE SODIUM 40 MG PO TBEC
DELAYED_RELEASE_TABLET | ORAL | Status: AC
  Administered 2018-02-03: 40 mg via ORAL
  Filled 2018-02-03: qty 1

## 2018-02-03 MED ORDER — ARIPIPRAZOLE 5 MG PO TABS
5.0000 mg | ORAL_TABLET | Freq: Every day | ORAL | Status: DC
  Administered 2018-02-03 – 2018-02-04 (×2): 5 mg via ORAL
  Filled 2018-02-03 (×5): qty 1

## 2018-02-03 MED ORDER — SERTRALINE HCL 50 MG PO TABS
50.0000 mg | ORAL_TABLET | Freq: Every day | ORAL | Status: DC
  Administered 2018-02-03 – 2018-02-07 (×5): 50 mg via ORAL
  Filled 2018-02-03 (×7): qty 1

## 2018-02-03 MED ORDER — PRAZOSIN HCL 2 MG PO CAPS
2.0000 mg | ORAL_CAPSULE | Freq: Every day | ORAL | Status: DC
  Administered 2018-02-03 – 2018-02-04 (×2): 2 mg via ORAL
  Filled 2018-02-03 (×3): qty 1

## 2018-02-03 MED ORDER — GABAPENTIN 300 MG PO CAPS
300.0000 mg | ORAL_CAPSULE | Freq: Four times a day (QID) | ORAL | Status: DC
  Administered 2018-02-03 – 2018-02-07 (×16): 300 mg via ORAL
  Filled 2018-02-03 (×23): qty 1

## 2018-02-03 MED ORDER — PANTOPRAZOLE SODIUM 40 MG PO TBEC
40.0000 mg | DELAYED_RELEASE_TABLET | Freq: Every day | ORAL | Status: DC
  Administered 2018-02-03 – 2018-02-07 (×5): 40 mg via ORAL
  Filled 2018-02-03 (×6): qty 1

## 2018-02-03 NOTE — Progress Notes (Signed)
D  Pt is pleasant on approach and cooperative    She complained of withdrawal symptoms which include increased anxiety,cold and hot sweats, and just feeling lousy  She has been visible on the milieu and interacts appropriately with staff and peers  A    Verbal support given   Medications administered and effectiveness monitored   Q 15 min checks   Supplied cold pack for hot sweats  R   Pt is safe at present time and receptive to verbal support

## 2018-02-03 NOTE — Tx Team (Signed)
Interdisciplinary Treatment and Diagnostic Plan Update  02/03/2018 Time of Session: 0830AM Shirley IonMinnie Murphy MRN: [id number]  Principal Diagnosis: MDD (major depressive disorder), recurrent severe, without psychosis (HCC)  Secondary Diagnoses: Principal Problem:   MDD (major depressive disorder), recurrent severe, without psychosis (HCC) Active Problems:   Moderate cocaine use disorder (HCC)   Alcohol use disorder, severe, dependence (HCC)   Current Medications:  Current Facility-Administered Medications  Medication Dose Route Frequency Provider Last Rate Last Dose  . acetaminophen (TYLENOL) tablet 650 mg  650 mg Oral Q6H PRN Oneta RackLewis, Tanika N, NP   650 mg at 02/02/18 0640  . alum & mag hydroxide-simeth (MAALOX/MYLANTA) 200-200-20 MG/5ML suspension 30 mL  30 mL Oral Q4H PRN Oneta RackLewis, Tanika N, NP      . elvitegravir-cobicistat-emtricitabine-tenofovir (GENVOYA) 150-150-200-10 MG tablet 1 tablet  1 tablet Oral Q breakfast Nira ConnBerry, Jason A, NP   1 tablet at 02/03/18 0755  . hydrOXYzine (ATARAX/VISTARIL) tablet 50 mg  50 mg Oral Q6H PRN Micheal Likensainville, Christopher T, MD   50 mg at 02/02/18 2236  . ibuprofen (ADVIL,MOTRIN) tablet 800 mg  800 mg Oral Q6H PRN Armandina StammerNwoko, Agnes I, NP   800 mg at 02/03/18 0816  . lidocaine (LIDODERM) 5 % 1 patch  1 patch Transdermal Daily Nira ConnBerry, Jason A, NP   1 patch at 02/03/18 0757  . loperamide (IMODIUM) capsule 2-4 mg  2-4 mg Oral PRN Oneta RackLewis, Tanika N, NP      . LORazepam (ATIVAN) tablet 1 mg  1 mg Oral Q6H PRN Oneta RackLewis, Tanika N, NP      . LORazepam (ATIVAN) tablet 1 mg  1 mg Oral TID Oneta RackLewis, Tanika N, NP       Followed by  . [START ON 02/04/2018] LORazepam (ATIVAN) tablet 1 mg  1 mg Oral BID Oneta RackLewis, Tanika N, NP       Followed by  . [START ON 02/06/2018] LORazepam (ATIVAN) tablet 1 mg  1 mg Oral Daily Lewis, Tanika N, NP      . magnesium hydroxide (MILK OF MAGNESIA) suspension 30 mL  30 mL Oral Daily PRN Oneta RackLewis, Tanika N, NP      . multivitamin with minerals tablet 1 tablet  1  tablet Oral Daily Oneta RackLewis, Tanika N, NP   1 tablet at 02/03/18 0755  . nicotine (NICODERM CQ - dosed in mg/24 hours) patch 21 mg  21 mg Transdermal Daily Antonieta Pertlary, Greg Lawson, MD   21 mg at 02/03/18 0755  . ondansetron (ZOFRAN-ODT) disintegrating tablet 4 mg  4 mg Oral Q6H PRN Oneta RackLewis, Tanika N, NP   4 mg at 02/03/18 0800  . thiamine (VITAMIN B-1) tablet 100 mg  100 mg Oral Daily Oneta RackLewis, Tanika N, NP   100 mg at 02/03/18 0755  . traZODone (DESYREL) tablet 50 mg  50 mg Oral QHS PRN Oneta RackLewis, Tanika N, NP   50 mg at 02/02/18 2236   PTA Medications: Medications Prior to Admission  Medication Sig Dispense Refill Last Dose  . elvitegravir-cobicistat-emtricitabine-tenofovir (GENVOYA) 150-150-200-10 MG TABS tablet Take 1 tablet by mouth daily with breakfast. 5 tablet 0   . gabapentin (NEURONTIN) 300 MG capsule Take 1 capsule (300 mg total) 4 (four) times daily by mouth. (Patient not taking: Reported on 08/04/2017) 120 capsule 0 Not Taking at Unknown time  . ibuprofen (ADVIL,MOTRIN) 200 MG tablet Take 800 mg by mouth every 6 (six) hours as needed for headache (pain).   01/31/2018 at Unknown time  . Lidocaine-Menthol [facility](LIDOPATCH PAIN RELIEF) 3.6-1.25 % PT[facility] Place 1 patch  onto the skin See admin instructions. Apply topically to back on work days   01/29/2018  . Multiple Vitamin (MULTIVITAMIN WITH MINERALS) TABS tablet Take 1 tablet by mouth daily.   couple days ago  . nicotine (NICODERM CQ - DOSED IN MG/24 HOURS) 21 mg/24hr patch Place 21 mg onto the skin daily as needed (smoking cessation).   01/28/2018  . traZODone (DESYREL) 150 MG tablet Take 150 mg by mouth at bedtime.   couple nights ago    Patient Stressors: Financial difficulties Medication change or noncompliance Substance abuse Traumatic event  Patient Strengths: Wellsite geologist fund of knowledge Motivation for treatment/growth Work skills  Treatment Modalities: Medication Management, Group therapy, Case management,  1 to 1 session with  clinician, Psychoeducation, Recreational therapy.   Physician Treatment Plan for Primary Diagnosis: MDD (major depressive disorder), recurrent severe, without psychosis (HCC) Long Term Goal(s): Improvement in symptoms so as ready for discharge Improvement in symptoms so as ready for discharge   Short Term Goals: Ability to disclose and discuss suicidal ideas Ability to demonstrate self-control will improve Ability to identify and develop effective coping behaviors will improve Compliance with prescribed medications will improve Ability to identify triggers associated with substance abuse/mental health issues will improve  Medication Management: Evaluate patient's response, side effects, and tolerance of medication regimen.  Therapeutic Interventions: 1 to 1 sessions, Unit Group sessions and Medication administration.  Evaluation of Outcomes: Progressing  Physician Treatment Plan for Secondary Diagnosis: Principal Problem:   MDD (major depressive disorder), recurrent severe, without psychosis (HCC) Active Problems:   Moderate cocaine use disorder (HCC)   Alcohol use disorder, severe, dependence (HCC)  Long Term Goal(s): Improvement in symptoms so as ready for discharge Improvement in symptoms so as ready for discharge   Short Term Goals: Ability to disclose and discuss suicidal ideas Ability to demonstrate self-control will improve Ability to identify and develop effective coping behaviors will improve Compliance with prescribed medications will improve Ability to identify triggers associated with substance abuse/mental health issues will improve     Medication Management: Evaluate patient's response, side effects, and tolerance of medication regimen.  Therapeutic Interventions: 1 to 1 sessions, Unit Group sessions and Medication administration.  Evaluation of Outcomes: Progressing   RN Treatment Plan for Primary Diagnosis: MDD (major depressive disorder), recurrent severe,  without psychosis (HCC) Long Term Goal(s): Knowledge of disease and therapeutic regimen to maintain health will improve  Short Term Goals: Ability to remain free from injury will improve, Ability to disclose and discuss suicidal ideas and Ability to identify and develop effective coping behaviors will improve  Medication Management: RN will administer medications as ordered by provider, will assess and evaluate patient's response and provide education to patient for prescribed medication. RN will report any adverse and/or side effects to prescribing provider.  Therapeutic Interventions: 1 on 1 counseling sessions, Psychoeducation, Medication administration, Evaluate responses to treatment, Monitor vital signs and CBGs as ordered, Perform/monitor CIWA, COWS, AIMS and Fall Risk screenings as ordered, Perform wound care treatments as ordered.  Evaluation of Outcomes: Progressing   LCSW Treatment Plan for Primary Diagnosis: MDD (major depressive disorder), recurrent severe, without psychosis (HCC) Long Term Goal(s): Safe transition to appropriate next level of care at discharge, Engage patient in therapeutic group addressing interpersonal concerns.  Short Term Goals: Engage patient in aftercare planning with referrals and resources, Facilitate patient progression through stages of change regarding substance use diagnoses and concerns and Identify triggers associated with mental health/substance abuse issues  Therapeutic Interventions: Assess for  all discharge needs, 1 to 1 time with Child psychotherapist, Explore available resources and support systems, Assess for adequacy in community support network, Educate family and significant other(s) on suicide prevention, Complete Psychosocial Assessment, Interpersonal group therapy.  Evaluation of Outcomes: Progressing   Progress in Treatment: Attending groups: No. Participating in groups: No. Pt has been isolative in room.  Taking medication as prescribed:  Yes. Toleration medication: Yes. Family/Significant other contact made: SPE completed with pt; pt declined to consent to collateral contact.  Patient understands diagnosis: Yes. Discussing patient identified problems/goals with staff: Yes. Medical problems stabilized or resolved: Yes. Denies suicidal/homicidal ideation: Yes. Issues/concerns per patient self-inventory: No. Other: n/a  New problem(s) identified: No, Describe:  n/a  New Short Term/Long Term Goal(s): detox, medication management for mood stabilization; elimination of SI thoughts; development of comprehensive mental wellness/sobriety plan.   Patient Goals:  "To fully detox and learn how to stay sober."   Discharge Plan or Barriers:  CSW assessing for appropriate referrals. Pt has [facility] screening on Wed, 6/26 at 8am. Vesta Mixer for outpatient care.   Reason for Continuation of Hospitalization: Anxiety Depression Medication stabilization Suicidal ideation Withdrawal symptoms  Estimated Length of Stay: Friday, 02/05/18  Attendees: Patient: Shirley Murphy 02/03/2018 10:13 AM  Physician: Dr. Altamese Cosho[place]on MD; Dr. Jola Babinski MD 02/03/2018 10:13 AM  Nursing: Rayfield Citizen RN; Huntley Dec RN 02/03/2018 10:13 AM  RN Care Manager: x 02/03/2018 10:13 AM  Social Worker: Corrie Mckusick LCSW 02/03/2018 10:13 AM  Recreational Therapist: x 02/03/2018 10:13 AM  Other: Armandina Stammer NP 02/03/2018 10:13 AM  Other:  02/03/2018 10:13 AM  Other: 02/03/2018 10:13 AM    Scribe for Treatment Team: Rona Ravens, LCSW 02/03/2018 10:13 AM
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
## 2018-02-03 NOTE — Progress Notes (Signed)
Patient ID: Shirley Murphy, female   DOB: [birth year], 32 y.o.   MRN: [id number]   Pt currently presents with a labile affect and anxious behavior. Reports ongoing anxiety, agitation and withdrawal symptoms. Pt reports she feels as if she is having issues handling her anxiety however he behavior is well controlled. Pt mood and affect labile. Has multiple somatic complaints today. Presents with signs and symptoms of withdrawal including anxiety, agitation, malaise and nausea/vomiting. Pt states goal is to "stop feeling so angry and to feel better physically and emotionally." Pt intends to do so by "color and sleep and avoid others." Pt also reported that reading a book in the hallway nook has helped to calm her anxiety.   Pt provided with medications per providers orders. Pt's labs and vitals were monitored throughout the night. Pt given a 1:1 about emotional and mental status. Pt supported and encouraged to express concerns and questions. Pt educated on medications. Provider notified of patients increased anxiety and somatic complaints.   Pt's safety ensured with 15 minute and environmental checks. Pt has passive SI, [facility] Pt currently denies HI and A/V hallucinations. Pt verbally agrees to seek staff if SI worsens, HI or A/VH occurs and to consult with staff before acting on any harmful thoughts. Will continue POC.

## 2018-02-03 NOTE — Progress Notes (Signed)
Recreation Therapy Notes  Date: 6.19.19 Time: 1000 Location: 300 Hall Dayroom  Group Topic: Stress Management  Goal Area(s) Addresses:  Patient will verbalize importance of using healthy stress management.  Patient will identify positive emotions associated with healthy stress management.   Intervention: Stress Management  Activity : Guided Imagery.  LRT introduced the stress management technique of guided imagery.  LRT read a script that lead patients through the process of taking a mental vacation.  Patients were to follow along as LRT read script to engage in activity.  Education: Stress Management, Discharge Planning.   Education Outcome: Acknowledges edcuation/In group clarification offered/Needs additional education  Clinical Observations/Feedback: Pt did not attend group.    Caroll RancherMarjette Ellene Bloodsaw, LRT/CTRS          Caroll RancherLindsay, Lorrie Strauch A 02/03/2018 11:52 AM

## 2018-02-03 NOTE — [facility] Group Notes (Signed)
LCSW Group Therapy Note 02/03/2018 2:13 PM  Type of Therapy/Topic: Group Therapy: Feelings about Diagnosis  Participation Level: Did Not Attend   Description of Group:  This group will allow patients to explore their thoughts and feelings about diagnoses they have received. Patients will be guided to explore their level of understanding and acceptance of these diagnoses. Facilitator will encourage patients to process their thoughts and feelings about the reactions of others to their diagnosis and will guide patients in identifying ways to discuss their diagnosis with significant others in their lives. This group will be process-oriented, with patients participating in exploration of their own experiences, giving and receiving support, and processing challenge from other group members.  Therapeutic Goals: 1. Patient will demonstrate understanding of diagnosis as evidenced by identifying two or more symptoms of the disorder 2. Patient will be able to express two feelings regarding the diagnosis 3. Patient will demonstrate their ability to communicate their needs through discussion and/or role play  Summary of Patient Progress:  Invited, chose not to attend.     Therapeutic Modalities:  Cognitive Behavioral Therapy Brief Therapy Feelings Identification    Briana Farner LCSWA Clinical Social Worker   

## 2018-02-04 NOTE — Progress Notes (Signed)
[facility] Group Notes:  (Nursing/MHT/Case Management/Adjunct)  Date:  02/04/2018  Time:  2030  Type of Therapy:  wrap up group  Participation Level:  Active  Participation Quality:  Appropriate, Attentive, Sharing and Supportive  Affect:  Anxious, Appropriate and Irritable  Cognitive:  Appropriate  Insight:  Lacking  Engagement in Group:  Engaged  Modes of Intervention:  Clarification, Education and Support  Summary of Progress/Problems: Pt reported being awake more today and that it was a good thing. Pt attributed her being awake to not taking ativan but reports feeling as if she could still use some for detox. If pt could change any one thing about her life she reported it would be the people who raised her and that they are the reason for her being an addict. Pt is grateful for her job where she works at [employer]. Pt said her job will give her as much time as she needs. Pt is heard saying she wants to get out of here and she really doesn't want to be in here.   Shirley Murphy, Shirley Murphy S 02/04/2018, 9:35 PM

## 2018-02-04 NOTE — Progress Notes (Signed)
D:  Patient's self inventory sheet, patient feels good, normal energy level, good concentration.  Rated depression 7, hopeless 8, anxiety 9.  Withdrawals, diarrhea, chilling, agitation, nausea, irritability.  Denied SI.  Denied physical problems.  Checked headaches.  Physical pain, worst pain in past 24 hours is #9, low back.  Pain medication is helpful.  Goal is to come out of her room.  Plans to attend groups.  No discharge plans. A:  Medications administered per MD orders.  Emotional support and encouragement given patient. R:  Denied SI and HI, contracts for safety.  Denied A/V hallucinations.  Safety maintained with 15 minute checks.

## 2018-02-04 NOTE — Plan of Care (Signed)
Nurse discussed anxiety, depression, coping skills with patient. 

## 2018-02-04 NOTE — [facility] Group Notes (Signed)
LCSW Group Therapy Note  02/04/2018 1:15pm  Type of Therapy and Topic:  Group Therapy:  Feelings around Relapse and Recovery  Participation Level:  Did Not Attend--pt invited. Chose to remain in bed.    Description of Group:    Patients in this group will discuss emotions they experience before and after a relapse. They will process how experiencing these feelings, or avoidance of experiencing them, relates to having a relapse. Facilitator will guide patients to explore emotions they have related to recovery. Patients will be encouraged to process which emotions are more powerful. They will be guided to discuss the emotional reaction significant others in their lives may have to their relapse or recovery. Patients will be assisted in exploring ways to respond to the emotions of others without this contributing to a relapse.  Therapeutic Goals: 1. Patient will identify two or more emotions that lead to a relapse for them 2. Patient will identify two emotions that result when they relapse 3. Patient will identify two emotions related to recovery 4. Patient will demonstrate ability to communicate their needs through discussion and/or role plays   Summary of Patient Progress:   x  Therapeutic Modalities:   Cognitive Behavioral Therapy Solution-Focused Therapy Assertiveness Training Relapse Prevention Therapy   Rona RavensHeather S Endi Lagman, LCSW 02/04/2018 10:07 AM

## 2018-02-04 NOTE — Progress Notes (Signed)
D    Pt talked on the phone a good bit this evening   She attended group and participated appropriately   She interacts well with others   She reports mood swings and said she has gone from happy to sad several times today  A   Verbal support given   Medications administered and effectiveness monitored    Q 15 min checks R   Pt is safe at present time
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
## 2018-02-05 LAB — T4, FREE: Free T4: 0.7 ng/dL — ABNORMAL LOW (ref 0.82–1.77)

## 2018-02-05 MED ORDER — ARIPIPRAZOLE 5 MG PO TABS
5.0000 mg | ORAL_TABLET | Freq: Every day | ORAL | Status: DC
  Administered 2018-02-06: 5 mg via ORAL
  Filled 2018-02-05 (×2): qty 1

## 2018-02-05 MED ORDER — PRAZOSIN HCL 1 MG PO CAPS
1.0000 mg | ORAL_CAPSULE | Freq: Every day | ORAL | Status: DC
  Administered 2018-02-05 – 2018-02-06 (×2): 1 mg via ORAL
  Filled 2018-02-05 (×4): qty 1

## 2018-02-05 MED ORDER — METHOCARBAMOL 500 MG PO TABS
500.0000 mg | ORAL_TABLET | Freq: Four times a day (QID) | ORAL | Status: DC | PRN
  Administered 2018-02-05: 500 mg via ORAL
  Filled 2018-02-05: qty 1
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
## 2018-02-05 NOTE — Progress Notes (Signed)
Pt attended orientation and goals group. Pt goal for the day is to talk to the doctor and possibly go home

## 2018-02-05 NOTE — Progress Notes (Signed)
Recreation Therapy Notes  Date: 6.21.19 Time: 0930 Location: [address] Dayroom  Group Topic: Stress Management  Goal Area(s) Addresses:  Patient will verbalize importance of using healthy stress management.  Patient will identify positive emotions associated with healthy stress management.   Intervention: Stress Management  Activity :  LRT introduced the stress management technique of meditation.  LRT played a meditation and patients were to follow along as meditation was played to fully engage in the activity.  Education:  Stress Management, Discharge Planning.   Education Outcome: Acknowledges edcuation/In group clarification offered/Needs additional education  Clinical Observations/Feedback: Pt did not attend group.    Caroll RancherMarjette Levy Wellman, LRT/CTRS         Caroll RancherLindsay, Hyman Crossan A 02/05/2018 11:48 AM

## 2018-02-05 NOTE — Progress Notes (Addendum)
Patient ID: Shirley Murphy, female   DOB: November 17, 1985, 32 y.o.   MRN: [id number]   Pt currently presents with an anxious affect and appropriate behavior. Reports ongoing depression and anxiety. Pt has concerns about Abilify and its side effects, refused medication this morning. Pt states that she "wants to go back to work and keep planning for things in the future." Pt states goal is to "find a purpose." She intends to do so by "pray(ing)."  Pt provided with medications per providers orders. Pt's labs and vitals were monitored throughout the night. Pt given a 1:1 about emotional and mental status. Pt supported and encouraged to express concerns and questions. Pt educated on medications.  Pt's safety ensured with 15 minute and environmental checks. Pt endorses baseline passive SI thoughts, [facility]. Pt currently denies HI and A/V hallucinations. Pt verbally agrees to seek staff if SI worsens, HI or A/VH occurs and to consult with staff before acting on any harmful thoughts. Will continue POC.

## 2018-02-05 NOTE — [facility] Group Notes (Signed)
LCSW Group Therapy Note 02/05/2018 2:15 PM  Type of Therapy and Topic: Group Therapy: Avoiding Self-Sabotaging and Enabling Behaviors  Participation Level: Did Not Attend  Description of Group:  In this group, patients will learn how to identify obstacles, self-sabotaging and enabling behaviors, as well as: what are they, why do we do them and what needs these behaviors meet. Discuss unhealthy relationships and how to have positive healthy boundaries with those that sabotage and enable. Explore aspects of self-sabotage and enabling in yourself and how to limit these self-destructive behaviors in everyday life.  Therapeutic Goals: 1. Patient will identify one obstacle that relates to self-sabotage and enabling behaviors 2. Patient will identify one personal self-sabotaging or enabling behavior they did prior to admission 3. Patient will state a plan to change the above identified behavior 4. Patient will demonstrate ability to communicate their needs through discussion and/or role play.   Summary of Patient Progress:  Invited, chose not to attend.     Therapeutic Modalities:  Cognitive Behavioral Therapy Person-Centered Therapy Motivational Interviewing   Baldo DaubJolan Jamarie Joplin LCSWA Clinical Social Worker

## 2018-02-05 NOTE — [facility] Group Notes (Signed)
[facility] Group Notes:  (Nursing/MHT/Case Management/Adjunct)  Date:  02/05/2018  Time:  4:55 PM  Type of Therapy:  Psychoeducational Skills  Participation Level:  Active  Participation Quality:  Appropriate and Attentive  Affect:  Anxious and Blunted  Cognitive:  Alert and Oriented  Insight:  Lacking  Engagement in Group:  Defensive and Lacking  Modes of Intervention:  Activity, Discussion and Education  Summary of Progress/Problems: Pt able to identify one aspect of recovery to work on post discharge.   Brett AlbinoLandis, Brenetta Penny E 02/05/2018, 4:55 PM

## 2018-02-06 LAB — T3, FREE: T3, Free: 2.6 pg/mL (ref 2.0–4.4)

## 2018-02-06 NOTE — Progress Notes (Signed)
Patient did attend the evening speaker AA meeting.  

## 2018-02-06 NOTE — Progress Notes (Addendum)
Patient ID: Shirley Murphy, female   DOB: 02-06-1986, 32 y.o.   MRN: [id number]  Pt currently presents with an animated affect and anxious behavior. Reports ongoing anxiety. Pt states goal is to "go home and get back to work." Pt reports better sleep last night. Denies any current "out of body" feelings.   Pt provided with medications per providers orders. Pt's labs and vitals were monitored during shift. Pt given a 1:1 about emotional and mental status. Pt supported and encouraged to express concerns and questions. Pt educated on medications and assertiveness techniques.   Pt's safety ensured with 15 minute and environmental checks. Pt currently denies SI/HI and A/V hallucinations. Pt verbally agrees to seek staff if SI/HI or A/VH occurs and to consult with staff before acting on any harmful thoughts. Will continue POC.

## 2018-02-06 NOTE — Progress Notes (Signed)
Pt attended AA meeting.  
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
## 2018-02-06 NOTE — Progress Notes (Signed)
D: Pt continue to be somatic symptoms. Pt had complains that include back pain, restless legs, muscle spasm bilateral legs, out of body experience, I feel my b/p is high, I feel my temperature is high, I don't think I will be able to sleep today (b/p and temperature WNL). Pt also complained of moderate anxiety and depression. Pt however; denied SI/HI of AVH, "I feel a little better today." A: Medications offered as prescribed. Pt given the opportunity to ask questions and state concerns. Support, encouragement, and safe environment provided. R: Pt verbally acknowledged the teachings. Pt was med compliant. All patient's questions and concerns addressed. 15-minute safety checks continue. Safety checks continue. Pt attended AA group
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
## 2018-02-07 MED ORDER — HYDROXYZINE HCL 50 MG PO TABS: 50.0000 mg | ORAL_TABLET | Freq: Four times a day (QID) | ORAL | 0 refills | Status: DC | PRN

## 2018-02-07 MED ORDER — LIDOCAINE 5 % EX PTCH: 1.0000 | MEDICATED_PATCH | Freq: Every day | CUTANEOUS | 0 refills | Status: DC

## 2018-02-07 MED ORDER — PRAZOSIN HCL 1 MG PO CAPS: 1.0000 mg | ORAL_CAPSULE | Freq: Every day | ORAL | 0 refills | Status: DC

## 2018-02-07 MED ORDER — GABAPENTIN 300 MG PO CAPS: 300.0000 mg | ORAL_CAPSULE | Freq: Four times a day (QID) | ORAL | 0 refills | Status: DC

## 2018-02-07 MED ORDER — TRAZODONE HCL 50 MG PO TABS: 50.0000 mg | ORAL_TABLET | Freq: Every evening | ORAL | 0 refills | Status: DC | PRN

## 2018-02-07 MED ORDER — SERTRALINE HCL 50 MG PO TABS: 50.0000 mg | ORAL_TABLET | Freq: Every day | ORAL | 0 refills | Status: DC

## 2018-02-07 MED ORDER — ARIPIPRAZOLE 5 MG PO TABS: 5.0000 mg | ORAL_TABLET | Freq: Every day | ORAL | 0 refills | Status: DC
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
## 2018-02-07 NOTE — Progress Notes (Signed)
Patient ID: Shirley [facility], female   DOB: 1986/04/08, 32 y.o.   MRN: [id number] D: Pt continue to complain of moderate lower back pain, moderate anxiety and depression; "I feel bad about everything I've done to my family." Pt denied SI/HI of AVH, "I am not feeling too bad, not feeling too good; just OK." A: Medications offered as prescribed. Pt given the opportunity to ask questions and state concerns. Support, encouragement, and safe environment provided. R: Pt verbally acknowledged the teachings. Pt was med compliant. All patient's questions and concerns addressed. 15-minute safety checks continue. Safety checks continue. Pt attended AA group.

## 2018-02-07 NOTE — Progress Notes (Signed)
Pt attended goals group for the day. Pt goal for the day is to go home

## 2018-02-07 NOTE — Progress Notes (Signed)
  [facility] :  Will you be returning to the same living situation after discharge:  Yes,  lives alone, will return to her apartment At discharge, do you have transportation home?: Yes,  sister or boyfriend Do you have the ability to pay for your medications: Yes,  needs help, is given information about several discount programs and a card  Release of information consent forms completed and turned in to Medical Records by CSW.   Patient to Follow up at: Follow-up Information    [facility] Follow up on 02/10/2018.   Why:  Screening for possible admission on Wed, 02/10/18 at 8:00AM. Please bring: Photo  Contact information: [address] [place] [id number] [phone]        [facility] Follow up on 02/10/2018.   Specialty:  Behavioral Health Why:  Hospital follow-up/assessment for services on Wed, 6/26 at 8:15AM. Please bring: Photo ID, social security card, and any proof of income if you have it. Thank you.  Contact informationElpidio Eric: [address] [place] [address] [phone]        [facility] Follow up on 02/23/2018.   Specialty:  Infectious Diseases Why:  Follow-up appt on Tuesday, 02/23/18 at 10:00AM. Thank you.  Contact information: [address], [address] [id number] mc [place] [id number] [phone]          Next level of care provider has access to [facility] Link:no  Safety Planning and Suicide Prevention discussed: No.  Declined by patient  Have you used any form of tobacco in the last 30 days? (Cigarettes, Smokeless Tobacco, Cigars, and/or Pipes): Yes  Has patient been referred to the Quitline?: Patient refused referral  Patient has been referred for addiction treatment: Yes  Lynnell ChadMareida J Grossman-Orr, LCSW 02/07/2018, 9:15 AM

## 2018-02-07 NOTE — Progress Notes (Signed)
Patient ID: [name], female   DOB: July 24, 1986, 32 y.o.   MRN: [id number]   Pt discharged to lobby. Pt was stable and appreciative at that time. All papers and prescriptions were given and valuables returned. Verbal understanding expressed. Denies SI/HI and A/VH. Pt given opportunity to express concerns and ask questions.

## 2018-02-07 NOTE — Progress Notes (Signed)
Patient ID: [facility], female   DOB: Aug 20, 1985, 32 y.o.   MRN: [id number]  Pt currently presents with an animated affect and anxious behavior. Pt has no somatic complaints today. Reports ongoing anxiety, back pain and concerns about relapse. Pt reports that she plans to follow up with her appointments and would like to attend AA meetings as well. Pt thinking future oriented, specifically going back to her job.  Pt states goal is to "go home."  Pt provided with medications per providers orders. Pt's labs and vitals were monitored during the shift. Pt given a 1:1 about emotional and mental status. Pt supported and encouraged to express concerns and questions. Pt educated on medications and suicide prevention precautions.   Pt's safety ensured with 15 minute and environmental checks. Pt currently denies SI/HI and A/V hallucinations. Pt verbally agrees to seek staff if SI/HI or A/VH occurs and to consult with staff before acting on any harmful thoughts. Pt to be discharged today per MD. Will continue POC.

## 2018-02-07 NOTE — [facility]HH Suicide Risk Assessment (Signed)
[facility] Discharge Suicide Risk Assessment   Principal Problem: MDD (major depressive disorder), recurrent severe, without psychosis (HCC) Discharge Diagnoses:  Patient Active Problem List   Diagnosis Date Noted  . Alcohol use disorder, severe, dependence (HCC) [F10.20] 02/02/2018  . MDD (major depressive disorder), recurrent severe, without psychosis (HCC) [F33.2] 02/01/2018  . Moderate cocaine use disorder (HCC) [F14.20] 06/12/2017  . Alcohol abuse with alcohol-induced mood disorder (HCC) [F10.14] 06/12/2017  . Substance induced mood disorder (HCC) [F19.94] 03/04/2017  . MDD (major depressive disorder), recurrent episode (HCC) [F33.9] 03/02/2017  . Suicide attempt (HCC) [T14.91XA] 10/15/2016  . HTN (hypertension), benign [I10] 10/15/2016  . Irregular heart beat [I49.9] 10/15/2016  . Suicidal ideation [R45.851] 10/15/2016  . Alcoholic intoxication without complication (HCC) [F10.920]     Total Time spent with patient: 30 minutes  Musculoskeletal: Strength & Muscle Tone: within normal limits Gait & Station: normal Patient leans: N/A  Psychiatric Specialty Exam: Review of Systems  All other systems reviewed and are negative.   Blood pressure (!) 150/90, pulse 78, temperature 98.1 F (36.7 C), temperature source Oral, resp. rate 18, height 5\' 2"  (1.575 m), weight 110.7 kg (244 lb), SpO2 99 %.Body mass index is 44.63 kg/m.  General Appearance: Casual  Eye Contact::  Good  Speech:  Clear and Coherent409  Volume:  Normal  Mood:  Euthymic  Affect:  Appropriate  Thought Process:  Coherent  Orientation:  Full (Time, Place, and Person)  Thought Content:  Logical  Suicidal Thoughts:  No  Homicidal Thoughts:  No  Memory:  Immediate;   Fair Recent;   Fair Remote;   Fair  Judgement:  Fair  Insight:  Good  Psychomotor Activity:  Normal  Concentration:  Good  Recall:  Good  Fund of Knowledge:Good  Language: Good  Akathisia:  Negative  Handed:  Right  AIMS (if indicated):      Assets:  Desire for Improvement Housing Physical Health Resilience Social Support  Sleep:  Number of Hours: 6.5  Cognition: WNL  ADL's:  Intact   Mental Status Per Nursing Assessment::   On Admission:  NA  Demographic Factors:  Caucasian, Low socioeconomic status and Unemployed  Loss Factors: NA  Historical Factors: Impulsivity  Risk Reduction Factors:   Sense of responsibility to family and Positive social support  Continued Clinical Symptoms:  Alcohol/Substance Abuse/Dependencies  Cognitive Features That Contribute To Risk:  None    Suicide Risk:  Minimal: No identifiable suicidal ideation.  Patients presenting with no risk factors but with morbid ruminations; may be classified as minimal risk based on the severity of the depressive symptoms  Follow-up Information    [facility] Follow up on 02/10/2018.   Why:  Screening for possible admission on Wed, 02/10/18 at 8:00AM. Please bring: Photo  Contact information: [address] [place] [phone]        [facility] Follow up on 02/10/2018.   Specialty:  Behavioral Health Why:  Hospital follow-up/assessment for services on Wed, 6/26 at 8:15AM. Please bring: Photo ID, social security card, and any proof of income if you have it. Thank you.  Contact informationElpidio [address] [place] [address] [phone]        [facility] for Infectious Disease Follow up on 02/23/2018.   Specialty:  Infectious Diseases Why:  Follow-up appt on Tuesday, 02/23/18 at 10:00AM. Thank you.  Contact information: [address] [place], [address] [id number] mc [place] [address] [phone]  Plan Of Care/Follow-up recommendations:  Activity:  ad lib  Shirley PertGreg Lawson Rodarius Kichline, MD 02/07/2018, 7:38 AM

## 2018-02-23 ENCOUNTER — Encounter: Payer: Self-pay | Admitting: Family

## 2018-02-23 ENCOUNTER — Emergency Department (HOSPITAL_COMMUNITY)
Admission: EM | Admit: 2018-02-23 | Discharge: 2018-02-24 | Disposition: A | Discharge status: Other Acute Inpatient Hospital | Payer: Medicaid - Out of State | Attending: Emergency Medicine | Admitting: Emergency Medicine

## 2018-02-23 ENCOUNTER — Encounter (HOSPITAL_COMMUNITY): Payer: Self-pay | Admitting: Emergency Medicine

## 2018-02-23 ENCOUNTER — Other Ambulatory Visit: Payer: Self-pay

## 2018-02-23 DIAGNOSIS — F1020 Alcohol dependence, uncomplicated: Secondary | ICD-10-CM | POA: Insufficient documentation

## 2018-02-23 DIAGNOSIS — Z008 Encounter for other general examination: Secondary | ICD-10-CM | POA: Insufficient documentation

## 2018-02-23 DIAGNOSIS — I10 Essential (primary) hypertension: Secondary | ICD-10-CM | POA: Insufficient documentation

## 2018-02-23 DIAGNOSIS — R45851 Suicidal ideations: Secondary | ICD-10-CM | POA: Insufficient documentation

## 2018-02-23 DIAGNOSIS — F1120 Opioid dependence, uncomplicated: Secondary | ICD-10-CM | POA: Insufficient documentation

## 2018-02-23 DIAGNOSIS — B182 Chronic viral hepatitis C: Secondary | ICD-10-CM | POA: Insufficient documentation

## 2018-02-23 DIAGNOSIS — R110 Nausea: Secondary | ICD-10-CM | POA: Insufficient documentation

## 2018-02-23 DIAGNOSIS — T50902A Poisoning by unspecified drugs, medicaments and biological substances, intentional self-harm, initial encounter: Secondary | ICD-10-CM

## 2018-02-23 DIAGNOSIS — F17210 Nicotine dependence, cigarettes, uncomplicated: Secondary | ICD-10-CM | POA: Insufficient documentation

## 2018-02-23 DIAGNOSIS — F332 Major depressive disorder, recurrent severe without psychotic features: Secondary | ICD-10-CM | POA: Insufficient documentation

## 2018-02-23 DIAGNOSIS — Z79899 Other long term (current) drug therapy: Secondary | ICD-10-CM | POA: Insufficient documentation

## 2018-02-23 DIAGNOSIS — T426X2A Poisoning by other antiepileptic and sedative-hypnotic drugs, intentional self-harm, initial encounter: Secondary | ICD-10-CM | POA: Insufficient documentation

## 2018-02-23 DIAGNOSIS — F1420 Cocaine dependence, uncomplicated: Secondary | ICD-10-CM | POA: Insufficient documentation

## 2018-02-23 LAB — MAGNESIUM: MAGNESIUM: 1.9 mg/dL (ref 1.7–2.4)

## 2018-02-23 LAB — CBC
HCT: 34.4 % — ABNORMAL LOW (ref 36.0–46.0)
HEMOGLOBIN: 10.6 g/dL — AB (ref 12.0–15.0)
MCH: 25.5 pg — AB (ref 26.0–34.0)
MCHC: 30.8 g/dL (ref 30.0–36.0)
MCV: 82.7 fL (ref 78.0–100.0)
PLATELETS: 302 10*3/uL (ref 150–400)
RBC: 4.16 MIL/uL (ref 3.87–5.11)
RDW: 16.6 % — ABNORMAL HIGH (ref 11.5–15.5)
WBC: 8.1 10*3/uL (ref 4.0–10.5)

## 2018-02-23 LAB — I-STAT BETA HCG BLOOD, ED (MC, WL, AP ONLY): I-stat hCG, quantitative: <5 m[IU]/mL (ref <5)

## 2018-02-23 LAB — RAPID URINE DRUG SCREEN, HOSP PERFORMED
AMPHETAMINES: NOT DETECTED
BENZODIAZEPINES: NOT DETECTED
Cocaine: POSITIVE — AB
OPIATES: NOT DETECTED
TETRAHYDROCANNABINOL: NOT DETECTED

## 2018-02-23 LAB — CBG MONITORING, ED: GLUCOSE-CAPILLARY: 88 mg/dL (ref 70–99)

## 2018-02-23 LAB — COMPREHENSIVE METABOLIC PANEL
ALBUMIN: 3.7 g/dL (ref 3.5–5.0)
ALT: 21 U/L (ref 0–44)
ANION GAP: 10 (ref 5–15)
AST: 22 U/L (ref 15–41)
Alkaline Phosphatase: 58 U/L (ref 38–126)
BILIRUBIN TOTAL: 0.3 mg/dL (ref 0.3–1.2)
BUN: 10 mg/dL (ref 6–20)
CALCIUM: 9.1 mg/dL (ref 8.9–10.3)
CO2: 22 mmol/L (ref 22–32)
Chloride: 106 mmol/L (ref 98–111)
Creatinine, Ser: 0.79 mg/dL (ref 0.44–1.00)
GFR calc non Af Amer: >60 mL/min (ref >60)
GLUCOSE: 102 mg/dL — AB (ref 70–99)
POTASSIUM: 3.4 mmol/L — AB (ref 3.5–5.1)
SODIUM: 138 mmol/L (ref 135–145)
TOTAL PROTEIN: 6.8 g/dL (ref 6.5–8.1)

## 2018-02-23 LAB — ACETAMINOPHEN LEVEL: Acetaminophen (Tylenol), Serum: <10 ug/mL — ABNORMAL LOW (ref 10–30)

## 2018-02-23 LAB — SALICYLATE LEVEL: Salicylate Lvl: <7 mg/dL (ref 2.8–30.0)

## 2018-02-23 LAB — ETHANOL: Alcohol, Ethyl (B): <10 mg/dL (ref <10)

## 2018-02-23 MED ORDER — LORAZEPAM 1 MG PO TABS: 0.0000 mg | ORAL_TABLET | Freq: Two times a day (BID) | ORAL | Status: DC

## 2018-02-23 MED ORDER — ARIPIPRAZOLE 5 MG PO TABS
5.0000 mg | ORAL_TABLET | Freq: Every day | ORAL | Status: DC
  Administered 2018-02-23: 5 mg via ORAL
  Filled 2018-02-23: qty 1

## 2018-02-23 MED ORDER — ALUM & MAG HYDROXIDE-SIMETH 200-200-20 MG/5ML PO SUSP: 30.0000 mL | Freq: Four times a day (QID) | ORAL | Status: DC | PRN

## 2018-02-23 MED ORDER — ONDANSETRON HCL 4 MG PO TABS
4.0000 mg | ORAL_TABLET | Freq: Three times a day (TID) | ORAL | Status: DC | PRN
  Administered 2018-02-23: 4 mg via ORAL
  Filled 2018-02-23: qty 1

## 2018-02-23 MED ORDER — IBUPROFEN 400 MG PO TABS
600.0000 mg | ORAL_TABLET | Freq: Three times a day (TID) | ORAL | Status: DC | PRN
  Administered 2018-02-23: 600 mg via ORAL
  Filled 2018-02-23: qty 1

## 2018-02-23 MED ORDER — LORAZEPAM 2 MG/ML IJ SOLN: 0.0000 mg | Freq: Four times a day (QID) | INTRAMUSCULAR | Status: DC

## 2018-02-23 MED ORDER — SERTRALINE HCL 50 MG PO TABS: 50.0000 mg | ORAL_TABLET | Freq: Every day | ORAL | Status: DC

## 2018-02-23 MED ORDER — PRAZOSIN HCL 1 MG PO CAPS
1.0000 mg | ORAL_CAPSULE | Freq: Every day | ORAL | Status: DC
  Administered 2018-02-23: 1 mg via ORAL
  Filled 2018-02-23: qty 1

## 2018-02-23 MED ORDER — TRAZODONE HCL 50 MG PO TABS
50.0000 mg | ORAL_TABLET | Freq: Every evening | ORAL | Status: DC | PRN
  Administered 2018-02-23: 50 mg via ORAL
  Filled 2018-02-23 (×2): qty 1

## 2018-02-23 MED ORDER — HYDROXYZINE HCL 50 MG PO TABS: 50.0000 mg | ORAL_TABLET | Freq: Four times a day (QID) | ORAL | Status: DC | PRN

## 2018-02-23 MED ORDER — LORAZEPAM 1 MG PO TABS
0.0000 mg | ORAL_TABLET | Freq: Four times a day (QID) | ORAL | Status: DC
  Administered 2018-02-23 (×2): 1 mg via ORAL
  Filled 2018-02-23 (×2): qty 1

## 2018-02-23 MED ORDER — NICOTINE 21 MG/24HR TD PT24
21.0000 mg | MEDICATED_PATCH | Freq: Every day | TRANSDERMAL | Status: DC
  Administered 2018-02-23: 21 mg via TRANSDERMAL
  Filled 2018-02-23: qty 1

## 2018-02-23 MED ORDER — VITAMIN B-1 100 MG PO TABS
100.0000 mg | ORAL_TABLET | Freq: Every day | ORAL | Status: DC
  Administered 2018-02-23: 100 mg via ORAL
  Filled 2018-02-23: qty 1

## 2018-02-23 MED ORDER — LORAZEPAM 2 MG/ML IJ SOLN: 0.0000 mg | Freq: Two times a day (BID) | INTRAMUSCULAR | Status: DC

## 2018-02-23 MED ORDER — THIAMINE HCL 100 MG/ML IJ SOLN: 100.0000 mg | Freq: Every day | INTRAMUSCULAR | Status: DC
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
## 2018-02-23 NOTE — ED Provider Notes (Signed)
Patient placed in Quick Look pathway, seen and evaluated   Chief Complaint: SI  HPI:   attempted OD with her psych meds  ROS: no HI (one)  Physical Exam:   Gen: No distress  Neuro: Awake and Alert  Skin: Warm    Focused Exam: anxious, no acute distress   Initiation of care has begun. The patient has been counseled on the process, plan, and necessity for staying for the completion/evaluation, and the remainder of the medical screening examination    Fayrene Helperran, Kaytelyn Glore, Cordelia Poche-C 02/23/18 1310    Pricilla LovelessGoldston, Scott, MD 02/25/18 [phone]

## 2018-02-23 NOTE — ED Notes (Signed)
Pt given snack with gingerale

## 2018-02-23 NOTE — ED Notes (Signed)
Pharm Tech aware of need for Med Rec.

## 2018-02-23 NOTE — ED Notes (Signed)
TTS at bedside. 

## 2018-02-23 NOTE — Progress Notes (Deleted)
Subjective:    Patient ID: Shirley Murphy, female    DOB: March 29, 1986, 32 y.o.   MRN: [id number]  No chief complaint on file.   HPI:  Shirley Murphy is a 32 y.o. female who presents today for initial evaluation and treatment of Hepatitis C.   Shirley Murphy has a past medical history of depression and polysubstance abuse (cocaine, alcohol) who was found to have a positive Hepatitis C antibody test on 02/01/18. Her risk factors for Hepatitis C include substance abuse, tattoos, blood transfusion, and sharing of razors/needles. She has never been treated for this. Denies any family or personal history of liver disease. She is currently asymptomatic. Her most recent liver function tests are within the normal ranges. Hepatitis B surface antigen was negative with no result for Hepatitis B surface antibody. HIV was negative.    Allergies  Allergen Reactions  . Other Other (See Comments)    benzoyl peroxide - used topically for acne cause facial and tongue swelling      Outpatient Medications Prior to Visit  Medication Sig Dispense Refill  . ARIPiprazole (ABILIFY) 5 MG tablet Take 1 tablet (5 mg total) by mouth at bedtime. For mood control 30 tablet 0  . gabapentin (NEURONTIN) 300 MG capsule Take 1 capsule (300 mg total) by mouth 4 (four) times daily. For withdrawal symptoms 120 capsule 0  . hydrOXYzine (ATARAX/VISTARIL) 50 MG tablet Take 1 tablet (50 mg total) by mouth every 6 (six) hours as needed for anxiety. 30 tablet 0  . lidocaine (LIDODERM) 5 % Place 1 patch onto the skin daily. Remove & Discard patch within 12 hours or as directed by MD 30 patch 0  . prazosin (MINIPRESS) 1 MG capsule Take 1 capsule (1 mg total) by mouth at bedtime. For nightmares 30 capsule 0  . sertraline (ZOLOFT) 50 MG tablet Take 1 tablet (50 mg total) by mouth daily. For mood control 30 tablet 0  . traZODone (DESYREL) 50 MG tablet Take 1 tablet (50 mg total) by mouth at bedtime as needed for sleep. 30 tablet 0     No facility-administered medications prior to visit.      Past Medical History:  Diagnosis Date  . Chest pain   . Hepatitis C   . Hypertension   . Irregular heart beat       Past Surgical History:  Procedure Laterality Date  . c sections    . TUBAL LIGATION        No family history on file.    Social History   Socioeconomic History  . Marital status: Legally Separated    Spouse name: Not on file  . Number of children: Not on file  . Years of education: Not on file  . Highest education level: Not on file  Occupational History  . Not on file  Social Needs  . Financial resource strain: Not on file  . Food insecurity:    Worry: Not on file    Inability: Not on file  . Transportation needs:    Medical: Not on file    Non-medical: Not on file  Tobacco Use  . Smoking status: Current Every Day Smoker    Packs/day: 1.00    Types: Cigarettes  . Smokeless tobacco: Never Used  Substance and Sexual Activity  . Alcohol use: Yes    Comment: case of beer a day  . Drug use: Yes    Types: Cocaine, Marijuana  . Sexual activity: Yes    Birth control/protection: None  Lifestyle  . Physical activity:    Days per week: Not on file    Minutes per session: Not on file  . Stress: Not on file  Relationships  . Social connections:    Talks on phone: Not on file    Gets together: Not on file    Attends religious service: Not on file    Active member of club or organization: Not on file    Attends meetings of clubs or organizations: Not on file    Relationship status: Not on file  . Intimate partner violence:    Fear of current or ex partner: Not on file    Emotionally abused: Not on file    Physically abused: Not on file    Forced sexual activity: Not on file  Other Topics Concern  . Not on file  Social History Narrative  . Not on file      Review of Systems  Constitutional: Negative for activity change, appetite change, diaphoresis, fatigue, fever and  unexpected weight change.  HENT: Negative for congestion, sinus pressure and sore throat.   Respiratory: Negative for cough, chest tightness, shortness of breath and wheezing.   Cardiovascular: Negative for chest pain and leg swelling.  Gastrointestinal: Negative for abdominal pain, constipation, diarrhea, nausea and vomiting.  Neurological: Negative for weakness and headaches.       Objective:    There were no vitals taken for this visit. Nursing note and vital signs reviewed.  Physical Exam  Constitutional: She is oriented to person, place, and time. She appears well-developed. No distress.  Cardiovascular: Normal rate, regular rhythm, normal heart sounds and intact distal pulses. Exam reveals no gallop and no friction rub.  No murmur heard. Pulmonary/Chest: Effort normal and breath sounds normal. No respiratory distress. She has no wheezes. She has no rales. She exhibits no tenderness.  Abdominal: Soft. Bowel sounds are normal. She exhibits no distension, no ascites and no mass. There is no hepatosplenomegaly. There is no tenderness. There is no rebound and no guarding.  Neurological: She is alert and oriented to person, place, and time.  Skin: Skin is warm and dry.  Psychiatric: She has a normal mood and affect.        Assessment & Plan:   Problem List Items Addressed This Visit      Digestive   Chronic hepatitis C without hepatic coma (HCC) - Primary       I am having Shirley Murphy maintain her lidocaine, gabapentin, traZODone, hydrOXYzine, ARIPiprazole, prazosin, and sertraline.   No orders of the defined types were placed in this encounter.    Follow-up: No follow-ups on file.  Jeanine LuzGregory Arthea Nobel, FNP [facility]

## 2018-02-23 NOTE — Progress Notes (Signed)
Per Assunta FoundShuvon Rankin, NP pt is recommended for inpt treatment. [facility]. EDP Arthor CaptainHarris, Abigail, PA-C has been advised of the disposition. Pt's nurse Marcelino DusterMichelle, RN has also been advised.   Princess BruinsAquicha Tkeyah Burkman, MSW, LCSW Therapeutic Triage Specialist  [phone]

## 2018-02-23 NOTE — ED Provider Notes (Signed)
Medical screening examination/treatment/procedure(s) were conducted as a shared visit with non-physician practitioner(s) and myself.  I personally evaluated the patient during the encounter.  Clinical Impression:   Final diagnoses:  Intentional drug overdose, initial encounter [facility](HCC)      The patient is a 32 year old female, long history of drug and alcohol use, states that last night because of losing her boyfriend, her house and her family kicking her out (she already lost her 3 children long ago due to drug use) she realizes that she has nothing and states "I have nothing left to live for".  She tried to overdose on Neurontin, she is unclear how many medication she took because she was intoxicated at the time.  Today she realizes that she is more suicidal and has been thoughts of jumping off a bridge.  In the past she is tried to hang herself as well as other types of suicide.  On exam the patient is distraught, tearful, depressed, actively suicidal with an active plan.  She will need evaluation, poison control recommendations for the Neurontin overdose though this did occur more than 12 hours ago.  She appears clinically stable, abdomen is soft, lungs are clear, no tachycardia.  Will need psychiatric admission.   Eber HongMiller, Shirley Haughton, MD 02/26/18 [phone]

## 2018-02-23 NOTE — ED Triage Notes (Addendum)
Took took a bunch of gabapentin 28 plus  Last night  and drank ETOH last night, smoked crack and did heroine last night wants to OD she states  Shaky hot and irriatble

## 2018-02-23 NOTE — ED Notes (Signed)
PA notified to place [facility]Monique,RN

## 2018-02-23 NOTE — ED Notes (Addendum)
RN spoke with PA who confirms Shirley BlonderDenise with Poison Control has cleared patient; [facility]Mon[facility]

## 2018-02-23 NOTE — ED Notes (Signed)
Patient ambulatory to bathroom with steady gait at this time 

## 2018-02-23 NOTE — ED Notes (Addendum)
Poison control called by Arthor CaptainAbigail Harris PA-C

## 2018-02-23 NOTE — Progress Notes (Signed)
[facility] [name], NP is the accepting provider.  Dr. Jama Flavorsobos is the attending provider.   Call report to [phone]. Pt is voluntary and can be transported by [facility]. Before being transported, it must be documented that pt is medically cleared by EDP and Poison Control.   Wells GuilesSarah Nathaneil Feagans, LCSW, LCAS Disposition CSW [facility]/TTS [phone]

## 2018-02-23 NOTE — ED Provider Notes (Signed)
[facility] EMERGENCY DEPARTMENT Provider Note   CSN: [id number] Arrival date & time: 02/23/18  1242     History   Chief Complaint Chief Complaint  Patient presents with  . Suicidal    HPI Shirley Murphy is a 32 y.o. female who  has a past medical history of Chest pain, Hepatitis C, Hypertension, Irregular heart beat, ETOH dependence, Cocaine dependence, and Previous suicide attempts. Patient presents for attempted OD with intent to harm herself. Patient states that last night she thinks around 10 PM she drank very heavily, smoked cocaine and heroine and took approximately 38 gabapentin tablets in an attempt to kill herself.  Patient states that she wants to stop using drugs but does not know how to stop and felt like this was the only way she could end it.  She states that she drinks greater than a case of beer daily and has the shakes when she does not drink.  She smokes crack cocaine daily.  She states that she has been using heroin for the past week and hopes that she would overdose.  She has previous suicide attempts including hanging.  She has current symptoms of nausea, headache, and anxiousness.    HPI  Past Medical History:  Diagnosis Date  . Chest pain   . Hepatitis C   . Hypertension   . Irregular heart beat     Patient Active Problem List   Diagnosis Date Noted  . Chronic hepatitis C without hepatic coma (HCC) 02/23/2018  . Alcohol use disorder, severe, dependence (HCC) 02/02/2018  . MDD (major depressive disorder), recurrent severe, without psychosis (HCC) 02/01/2018  . Moderate cocaine use disorder (HCC) 06/12/2017  . Alcohol abuse with alcohol-induced mood disorder (HCC) 06/12/2017  . Substance induced mood disorder (HCC) 03/04/2017  . MDD (major depressive disorder), recurrent episode (HCC) 03/02/2017  . Suicide attempt (HCC) 10/15/2016  . HTN (hypertension), benign 10/15/2016  . Irregular heart beat 10/15/2016  . Suicidal ideation  10/15/2016  . Alcoholic intoxication without complication [facility])     Past Surgical History:  Procedure Laterality Date  . c sections    . TUBAL LIGATION       OB History   None      Home Medications    Prior to Admission medications   Medication Sig Start Date End Date Taking? Authorizing Provider  ARIPiprazole (ABILIFY) 5 MG tablet Take 1 tablet (5 mg total) by mouth at bedtime. For mood control 02/07/18   Money, Gerlene Burdock, FNP  gabapentin (NEURONTIN) 300 MG capsule Take 1 capsule (300 mg total) by mouth 4 (four) times daily. For withdrawal symptoms 02/07/18   Money, Gerlene Burdock, FNP  hydrOXYzine (ATARAX/VISTARIL) 50 MG tablet Take 1 tablet (50 mg total) by mouth every 6 (six) hours as needed for anxiety. 02/07/18   Money, Gerlene Burdock, FNP  lidocaine (LIDODERM) 5 % Place 1 patch onto the skin daily. Remove & Discard patch within 12 hours or as directed by MD 02/07/18   Money, Gerlene Burdock, FNP  prazosin (MINIPRESS) 1 MG capsule Take 1 capsule (1 mg total) by mouth at bedtime. For nightmares 02/07/18   Money, Gerlene Burdock, FNP  sertraline (ZOLOFT) 50 MG tablet Take 1 tablet (50 mg total) by mouth daily. For mood control 02/08/18   Money, Gerlene Burdock, FNP  traZODone (DESYREL) 50 MG tablet Take 1 tablet (50 mg total) by mouth at bedtime as needed for sleep. 02/07/18   Money, Gerlene Burdock, FNP    Family History  No family history on file.  Social History Social History   Tobacco Use  . Smoking status: Current Every Day Smoker    Packs/day: 1.00    Types: Cigarettes  . Smokeless tobacco: Never Used  Substance Use Topics  . Alcohol use: Yes    Comment: case of beer a day  . Drug use: Yes    Types: Cocaine, Marijuana     Allergies   Other   Review of Systems Review of Systems   Physical Exam Updated Vital Signs BP (!) 142/72 (BP Location: Left Arm)   Pulse 88   Temp 98.9 F (37.2 C) (Oral)   Resp 16   SpO2 100%   Physical Exam   ED Treatments / Results  Labs (all labs ordered are  listed, but only abnormal results are displayed) Labs Reviewed  COMPREHENSIVE METABOLIC PANEL - Abnormal; Notable for the following components:      Result Value   Potassium 3.4 (*)    Glucose, Bld 102 (*)    All other components within normal limits  CBC - Abnormal; Notable for the following components:   Hemoglobin 10.6 (*)    HCT 34.4 (*)    MCH 25.5 (*)    RDW 16.6 (*)    All other components within normal limits  RAPID URINE DRUG SCREEN, HOSP PERFORMED - Abnormal; Notable for the following components:   Cocaine POSITIVE (*)    Barbiturates   (*)    Value: Result not available. Reagent lot number recalled by manufacturer.   All other components within normal limits  ACETAMINOPHEN LEVEL - Abnormal; Notable for the following components:   Acetaminophen (Tylenol), Serum <10 (*)    All other components within normal limits  ETHANOL  SALICYLATE LEVEL  I-STAT BETA HCG BLOOD, ED ([facility], [facility], [facility] ONLY)    EKG None  Radiology No results found.  Procedures Procedures (including critical care time)  Medications Ordered in ED Medications - No data to display   Initial Impression / Assessment and Plan / ED Course  I have reviewed the triage vital signs and the nursing notes.  Pertinent labs & imaging results that were available during my care of the patient were reviewed by me and considered in my medical decision making (see chart for details).  Clinical Course as of Feb 25 12  Tue Feb 23, 2018  1629 Repeat ekg at 4 hours. Patient will need supportive care otherwise. [facility] who states that she is out of the window for OD effects from the drug ingestion.    [AH]  1820 Patient evaluated by TTS. Recommended for Inpatient Psych admission   [AH]    Clinical Course User Index [AH] Arthor CaptainHarris, Khamani Daniely, PA-C      Final Clinical Impressions(s) / ED Diagnoses   Final diagnoses:  Intentional drug overdose, initial encounter [facility](HCC)     ED Discharge Orders    None       Arthor CaptainHarris, Ellissa Ayo, PA-C 02/24/18 0014    Eber HongMiller, Brian, MD 02/26/18 [phone]

## 2018-02-23 NOTE — ED Notes (Signed)
ED Provider at bedside. 

## 2018-02-23 NOTE — ED Notes (Signed)
Pt wanded by security. 

## 2018-02-24 ENCOUNTER — Other Ambulatory Visit: Payer: Self-pay

## 2018-02-24 ENCOUNTER — Encounter (HOSPITAL_COMMUNITY): Payer: Self-pay | Admitting: *Deleted

## 2018-02-24 ENCOUNTER — Inpatient Hospital Stay (HOSPITAL_COMMUNITY)
Admission: AD | Admit: 2018-02-24 | Discharge: 2018-02-27 | DRG: 885 | Disposition: A | Discharge status: Home / Self Care | Payer: Medicaid - Out of State | Source: Intra-hospital | Attending: Psychiatry | Admitting: Psychiatry

## 2018-02-24 DIAGNOSIS — Z915 Personal history of self-harm: Secondary | ICD-10-CM

## 2018-02-24 DIAGNOSIS — F1024 Alcohol dependence with alcohol-induced mood disorder: Secondary | ICD-10-CM | POA: Diagnosis not present

## 2018-02-24 DIAGNOSIS — B182 Chronic viral hepatitis C: Secondary | ICD-10-CM | POA: Diagnosis present

## 2018-02-24 DIAGNOSIS — F1410 Cocaine abuse, uncomplicated: Secondary | ICD-10-CM | POA: Diagnosis present

## 2018-02-24 DIAGNOSIS — F1020 Alcohol dependence, uncomplicated: Secondary | ICD-10-CM | POA: Diagnosis present

## 2018-02-24 DIAGNOSIS — T50902D Poisoning by unspecified drugs, medicaments and biological substances, intentional self-harm, subsequent encounter: Secondary | ICD-10-CM | POA: Diagnosis not present

## 2018-02-24 DIAGNOSIS — Z79899 Other long term (current) drug therapy: Secondary | ICD-10-CM | POA: Diagnosis not present

## 2018-02-24 DIAGNOSIS — F17210 Nicotine dependence, cigarettes, uncomplicated: Secondary | ICD-10-CM

## 2018-02-24 DIAGNOSIS — T50904A Poisoning by unspecified drugs, medicaments and biological substances, undetermined, initial encounter: Secondary | ICD-10-CM | POA: Diagnosis not present

## 2018-02-24 DIAGNOSIS — F332 Major depressive disorder, recurrent severe without psychotic features: Secondary | ICD-10-CM | POA: Diagnosis not present

## 2018-02-24 DIAGNOSIS — T426X2A Poisoning by other antiepileptic and sedative-hypnotic drugs, intentional self-harm, initial encounter: Secondary | ICD-10-CM | POA: Diagnosis present

## 2018-02-24 DIAGNOSIS — I10 Essential (primary) hypertension: Secondary | ICD-10-CM | POA: Diagnosis present

## 2018-02-24 DIAGNOSIS — F4310 Post-traumatic stress disorder, unspecified: Secondary | ICD-10-CM | POA: Diagnosis present

## 2018-02-24 DIAGNOSIS — G4700 Insomnia, unspecified: Secondary | ICD-10-CM | POA: Diagnosis present

## 2018-02-24 DIAGNOSIS — F419 Anxiety disorder, unspecified: Secondary | ICD-10-CM | POA: Diagnosis not present

## 2018-02-24 DIAGNOSIS — H6690 Otitis media, unspecified, unspecified ear: Secondary | ICD-10-CM | POA: Diagnosis not present

## 2018-02-24 DIAGNOSIS — F1190 Opioid use, unspecified, uncomplicated: Secondary | ICD-10-CM | POA: Diagnosis not present

## 2018-02-24 DIAGNOSIS — Z888 Allergy status to other drugs, medicaments and biological substances status: Secondary | ICD-10-CM | POA: Diagnosis not present

## 2018-02-24 DIAGNOSIS — F410 Panic disorder [episodic paroxysmal anxiety] without agoraphobia: Secondary | ICD-10-CM

## 2018-02-24 DIAGNOSIS — F1110 Opioid abuse, uncomplicated: Secondary | ICD-10-CM | POA: Diagnosis present

## 2018-02-24 DIAGNOSIS — R45851 Suicidal ideations: Secondary | ICD-10-CM | POA: Diagnosis not present

## 2018-02-24 DIAGNOSIS — Z818 Family history of other mental and behavioral disorders: Secondary | ICD-10-CM

## 2018-02-24 DIAGNOSIS — Z811 Family history of alcohol abuse and dependence: Secondary | ICD-10-CM

## 2018-02-24 MED ORDER — ONDANSETRON 4 MG PO TBDP
4.0000 mg | ORAL_TABLET | Freq: Four times a day (QID) | ORAL | Status: AC | PRN
  Administered 2018-02-24 – 2018-02-25 (×2): 4 mg via ORAL
  Filled 2018-02-24 (×3): qty 1

## 2018-02-24 MED ORDER — GABAPENTIN 100 MG PO CAPS
100.0000 mg | ORAL_CAPSULE | Freq: Three times a day (TID) | ORAL | Status: DC
  Administered 2018-02-24 – 2018-02-27 (×9): 100 mg via ORAL
  Filled 2018-02-24 (×18): qty 1

## 2018-02-24 MED ORDER — ALUM & MAG HYDROXIDE-SIMETH 200-200-20 MG/5ML PO SUSP
30.0000 mL | ORAL | Status: DC | PRN
  Administered 2018-02-24: 30 mL via ORAL
  Filled 2018-02-24: qty 30

## 2018-02-24 MED ORDER — CHLORDIAZEPOXIDE HCL 25 MG PO CAPS: 25.0000 mg | ORAL_CAPSULE | Freq: Four times a day (QID) | ORAL | Status: DC | PRN

## 2018-02-24 MED ORDER — HYDROXYZINE HCL 25 MG PO TABS
25.0000 mg | ORAL_TABLET | Freq: Four times a day (QID) | ORAL | Status: AC | PRN
  Administered 2018-02-25 – 2018-02-27 (×5): 25 mg via ORAL
  Filled 2018-02-24 (×5): qty 1
  Filled 2018-02-24: qty 10

## 2018-02-24 MED ORDER — THIAMINE HCL 100 MG/ML IJ SOLN
100.0000 mg | Freq: Once | INTRAMUSCULAR | Status: AC
  Administered 2018-02-24: 100 mg via INTRAMUSCULAR
  Filled 2018-02-24: qty 2

## 2018-02-24 MED ORDER — CHLORDIAZEPOXIDE HCL 25 MG PO CAPS
25.0000 mg | ORAL_CAPSULE | ORAL | Status: AC
  Administered 2018-02-26 (×2): 25 mg via ORAL
  Filled 2018-02-24 (×2): qty 1

## 2018-02-24 MED ORDER — PRAZOSIN HCL 1 MG PO CAPS
1.0000 mg | ORAL_CAPSULE | Freq: Every day | ORAL | Status: DC
  Administered 2018-02-24 – 2018-02-26 (×3): 1 mg via ORAL
  Filled 2018-02-24 (×6): qty 1

## 2018-02-24 MED ORDER — NICOTINE 21 MG/24HR TD PT24
21.0000 mg | MEDICATED_PATCH | Freq: Every day | TRANSDERMAL | Status: DC
  Administered 2018-02-24 – 2018-02-27 (×4): 21 mg via TRANSDERMAL
  Filled 2018-02-24 (×7): qty 1

## 2018-02-24 MED ORDER — LEVOTHYROXINE SODIUM 25 MCG PO TABS
25.0000 ug | ORAL_TABLET | Freq: Every day | ORAL | Status: DC
  Administered 2018-02-25 – 2018-02-27 (×3): 25 ug via ORAL
  Filled 2018-02-24 (×6): qty 1

## 2018-02-24 MED ORDER — ONDANSETRON HCL 4 MG PO TABS
ORAL_TABLET | ORAL | Status: AC
  Filled 2018-02-24: qty 1

## 2018-02-24 MED ORDER — LOPERAMIDE HCL 2 MG PO CAPS: 2.0000 mg | ORAL_CAPSULE | ORAL | Status: AC | PRN

## 2018-02-24 MED ORDER — ONDANSETRON HCL 4 MG PO TABS
4.0000 mg | ORAL_TABLET | Freq: Three times a day (TID) | ORAL | Status: DC | PRN
  Administered 2018-02-24: 4 mg via ORAL

## 2018-02-24 MED ORDER — TRAZODONE HCL 50 MG PO TABS
50.0000 mg | ORAL_TABLET | Freq: Every evening | ORAL | Status: DC | PRN
  Administered 2018-02-24 – 2018-02-26 (×3): 50 mg via ORAL
  Filled 2018-02-24: qty 7
  Filled 2018-02-24 (×3): qty 1

## 2018-02-24 MED ORDER — CHLORDIAZEPOXIDE HCL 25 MG PO CAPS
25.0000 mg | ORAL_CAPSULE | Freq: Three times a day (TID) | ORAL | Status: AC
  Administered 2018-02-25 (×3): 25 mg via ORAL
  Filled 2018-02-24 (×3): qty 1

## 2018-02-24 MED ORDER — SERTRALINE HCL 50 MG PO TABS
50.0000 mg | ORAL_TABLET | Freq: Every day | ORAL | Status: DC
  Administered 2018-02-24 – 2018-02-27 (×4): 50 mg via ORAL
  Filled 2018-02-24 (×8): qty 1

## 2018-02-24 MED ORDER — CHLORDIAZEPOXIDE HCL 25 MG PO CAPS
25.0000 mg | ORAL_CAPSULE | Freq: Every day | ORAL | Status: AC
  Administered 2018-02-27: 25 mg via ORAL
  Filled 2018-02-24: qty 1

## 2018-02-24 MED ORDER — CHLORDIAZEPOXIDE HCL 25 MG PO CAPS
25.0000 mg | ORAL_CAPSULE | Freq: Four times a day (QID) | ORAL | Status: AC
  Administered 2018-02-24 (×3): 25 mg via ORAL
  Filled 2018-02-24 (×3): qty 1

## 2018-02-24 MED ORDER — MAGNESIUM HYDROXIDE 400 MG/5ML PO SUSP: 30.0000 mL | Freq: Every day | ORAL | Status: DC | PRN

## 2018-02-24 MED ORDER — IBUPROFEN 400 MG PO TABS
400.0000 mg | ORAL_TABLET | Freq: Four times a day (QID) | ORAL | Status: DC | PRN
  Administered 2018-02-24 – 2018-02-25 (×2): 400 mg via ORAL
  Filled 2018-02-24 (×2): qty 1

## 2018-02-24 MED ORDER — ADULT MULTIVITAMIN W/MINERALS CH
1.0000 | ORAL_TABLET | Freq: Every day | ORAL | Status: DC
  Administered 2018-02-24 – 2018-02-27 (×4): 1 via ORAL
  Filled 2018-02-24 (×8): qty 1

## 2018-02-24 MED ORDER — ACETAMINOPHEN 325 MG PO TABS
650.0000 mg | ORAL_TABLET | Freq: Four times a day (QID) | ORAL | Status: DC | PRN
  Administered 2018-02-24 – 2018-02-27 (×4): 650 mg via ORAL
  Filled 2018-02-24 (×4): qty 2

## 2018-02-24 MED ORDER — VITAMIN B-1 100 MG PO TABS
100.0000 mg | ORAL_TABLET | Freq: Every day | ORAL | Status: DC
  Administered 2018-02-25 – 2018-02-27 (×3): 100 mg via ORAL
  Filled 2018-02-24 (×6): qty 1

## 2018-02-24 MED ORDER — ARIPIPRAZOLE 2 MG PO TABS
2.0000 mg | ORAL_TABLET | Freq: Every day | ORAL | Status: DC
  Administered 2018-02-24 – 2018-02-26 (×3): 2 mg via ORAL
  Filled 2018-02-24 (×6): qty 1

## 2018-02-24 NOTE — Progress Notes (Signed)
Patient ID: Shirley Murphy, female   DOB: 08-15-1986, 32 y.o.   MRN: [id number] D: Patient sleeping since beginning of shift. Pt woke up late to take her medications. Pt reports she felt bad during the day and finally got a chance to sleep. Pt reports she is tolerating medication well but c/o multiple withdrawal symptoms. Pt reports goal is to go to a long term treatment facility and stay sober. Denies  SI/HI/AVH.No behavioral issues noted.  A: Support and encouragement offered as needed to express needs. Medications administered as prescribed.  R: Patient is safe and cooperative on unit. Will continue to monitor  for safety and stability.

## 2018-02-24 NOTE — Progress Notes (Signed)
Pt presents with an animated affect and anxious mood. Pt observed sitting on her bed, eating breakfast this morning on approach. During assessment, pt reported having chills, sweat, n/v and body aches. Pt said, "see my hands are shaking". Pt reported using Etoh, barbiturates, benzo's, heroin and cocaine daily. Pt UDS only positive for cocaine.Pt noted to be attention and med seeking as she's frequently pacing the hallway and asking for meds. MD notified of pt's complaints. Pt endorses passive SI with no plan or intent. Pt rated depression and anxiety 9/10 on her self inventory sheet.  Orders reviewed with pt. Order obtained for Zofran for n/v. Pt also provided with ginger ale. Pt offered tylenol for body aches but refused medication. 15 minute checks performed for safety. Pt encouraged to complete suicide safety plan. Environment checks performed every shift for safety. Verbal support provided. Pt encouraged to attend groups.  [facility] appt on July 19 th.

## 2018-02-24 NOTE — Tx Team (Addendum)
Interdisciplinary Treatment and Diagnostic Plan Update  02/24/2018 Time of Session: [place] MRN: [id number]  Principal Diagnosis: <principal problem not specified>  Secondary Diagnoses: Active Problems:   Severe recurrent major depression without psychotic features (HCC)   Current Medications:  Current Facility-Administered Medications  Medication Dose Route Frequency Provider Last Rate Last Dose  . acetaminophen (TYLENOL) tablet 650 mg  650 mg Oral Q6H PRN Lindon Romp A, NP   650 mg at 02/24/18 1441  . alum & mag hydroxide-simeth (MAALOX/MYLANTA) 200-200-20 MG/5ML suspension 30 mL  30 mL Oral Q4H PRN Lindon Romp A, NP      . ARIPiprazole (ABILIFY) tablet 2 mg  2 mg Oral Daily Cobos, Myer Peer, MD   2 mg at 02/24/18 1441  . chlordiazePOXIDE (LIBRIUM) capsule 25 mg  25 mg Oral Q6H PRN Cobos, Fernando A, MD      . chlordiazePOXIDE (LIBRIUM) capsule 25 mg  25 mg Oral QID Cobos, Myer Peer, MD   25 mg at 02/24/18 1441   Followed by  . [START ON 02/25/2018] chlordiazePOXIDE (LIBRIUM) capsule 25 mg  25 mg Oral TID Cobos, Myer Peer, MD       Followed by  . [START ON 02/26/2018] chlordiazePOXIDE (LIBRIUM) capsule 25 mg  25 mg Oral BH-qamhs Cobos, Myer Peer, MD       Followed by  . [START ON 02/27/2018] chlordiazePOXIDE (LIBRIUM) capsule 25 mg  25 mg Oral Daily Cobos, Fernando A, MD      . gabapentin (NEURONTIN) capsule 100 mg  100 mg Oral TID Cobos, Myer Peer, MD   100 mg at 02/24/18 1441  . hydrOXYzine (ATARAX/VISTARIL) tablet 25 mg  25 mg Oral Q6H PRN Cobos, Myer Peer, MD      . Derrill Memo ON 02/25/2018] levothyroxine (SYNTHROID, LEVOTHROID) tablet 25 mcg  25 mcg Oral QAC breakfast Cobos, Fernando A, MD      . loperamide (IMODIUM) capsule 2-4 mg  2-4 mg Oral PRN Cobos, Myer Peer, MD      . magnesium hydroxide (MILK OF MAGNESIA) suspension 30 mL  30 mL Oral Daily PRN Lindon Romp A, NP      . multivitamin with minerals tablet 1 tablet  1 tablet Oral Daily Cobos, Myer Peer, MD    1 tablet at 02/24/18 1443  . nicotine (NICODERM CQ - dosed in mg/24 hours) patch 21 mg  21 mg Transdermal Daily Cobos, Myer Peer, MD   21 mg at 02/24/18 0740  . ondansetron (ZOFRAN-ODT) disintegrating tablet 4 mg  4 mg Oral Q6H PRN Cobos, Fernando A, MD      . prazosin (MINIPRESS) capsule 1 mg  1 mg Oral QHS Cobos, Fernando A, MD      . sertraline (ZOLOFT) tablet 50 mg  50 mg Oral Daily Cobos, Myer Peer, MD   50 mg at 02/24/18 1443  . [START ON 02/25/2018] thiamine (VITAMIN B-1) tablet 100 mg  100 mg Oral Daily Cobos, Fernando A, MD      . traZODone (DESYREL) tablet 50 mg  50 mg Oral QHS PRN Rozetta Nunnery, NP       PTA Medications: Medications Prior to Admission  Medication Sig Dispense Refill Last Dose  . ARIPiprazole (ABILIFY) 5 MG tablet Take 1 tablet (5 mg total) by mouth at bedtime. For mood control 30 tablet 0 Past Week at Unknown time  . gabapentin (NEURONTIN) 300 MG capsule Take 1 capsule (300 mg total) by mouth 4 (four) times daily. For withdrawal symptoms 120 capsule 0  Past Week at Unknown time  . hydrOXYzine (ATARAX/VISTARIL) 50 MG tablet Take 1 tablet (50 mg total) by mouth every 6 (six) hours as needed for anxiety. 30 tablet 0 unknown  . ibuprofen (ADVIL,MOTRIN) 200 MG tablet Take 800 mg by mouth every 8 (eight) hours as needed for fever, headache, mild pain, moderate pain or cramping.   unknown  . lidocaine (LIDODERM) 5 % Place 1 patch onto the skin daily. Remove & Discard patch within 12 hours or as directed by MD 30 patch 0 Past Week at Unknown time  . prazosin (MINIPRESS) 1 MG capsule Take 1 capsule (1 mg total) by mouth at bedtime. For nightmares 30 capsule 0 Past Week at Unknown time  . sertraline (ZOLOFT) 50 MG tablet Take 1 tablet (50 mg total) by mouth daily. For mood control 30 tablet 0 Past Week at Unknown time  . traZODone (DESYREL) 50 MG tablet Take 1 tablet (50 mg total) by mouth at bedtime as needed for sleep. (Patient taking differently: Take 100 mg by mouth at  bedtime as needed for sleep. ) 30 tablet 0 Past Week at Unknown time    Patient Stressors: Financial difficulties Legal issue Medication change or noncompliance Substance abuse  Patient Strengths: Average or above average intelligence Communication skills General fund of knowledge  Treatment Modalities: Medication Management, Group therapy, Case management,  1 to 1 session with clinician, Psychoeducation, Recreational therapy.   Physician Treatment Plan for Primary Diagnosis: <principal problem not specified> Long Term Goal(s): Improvement in symptoms so as ready for discharge Improvement in symptoms so as ready for discharge   Short Term Goals: Ability to identify triggers associated with substance abuse/mental health issues will improve Ability to identify changes in lifestyle to reduce recurrence of condition will improve Ability to verbalize feelings will improve Ability to disclose and discuss suicidal ideas Ability to demonstrate self-control will improve Ability to identify and develop effective coping behaviors will improve Ability to maintain clinical measurements within normal limits will improve  Medication Management: Evaluate patient's response, side effects, and tolerance of medication regimen.  Therapeutic Interventions: 1 to 1 sessions, Unit Group sessions and Medication administration.  Evaluation of Outcomes: Not Met  Physician Treatment Plan for Secondary Diagnosis: Active Problems:   Severe recurrent major depression without psychotic features ([place])  Long Term Goal(s): Improvement in symptoms so as ready for discharge Improvement in symptoms so as ready for discharge   Short Term Goals: Ability to identify triggers associated with substance abuse/mental health issues will improve Ability to identify changes in lifestyle to reduce recurrence of condition will improve Ability to verbalize feelings will improve Ability to disclose and discuss suicidal  ideas Ability to demonstrate self-control will improve Ability to identify and develop effective coping behaviors will improve Ability to maintain clinical measurements within normal limits will improve     Medication Management: Evaluate patient's response, side effects, and tolerance of medication regimen.  Therapeutic Interventions: 1 to 1 sessions, Unit Group sessions and Medication administration.  Evaluation of Outcomes: Not Met   RN Treatment Plan for Primary Diagnosis: <principal problem not specified> Long Term Goal(s): Knowledge of disease and therapeutic regimen to maintain health will improve  Short Term Goals: Ability to identify and develop effective coping behaviors will improve and Compliance with prescribed medications will improve  Medication Management: RN will administer medications as ordered by provider, will assess and evaluate patient's response and provide education to patient for prescribed medication. RN will report any adverse and/or side effects to prescribing  provider.  Therapeutic Interventions: 1 on 1 counseling sessions, Psychoeducation, Medication administration, Evaluate responses to treatment, Monitor vital signs and CBGs as ordered, Perform/monitor CIWA, COWS, AIMS and Fall Risk screenings as ordered, Perform wound care treatments as ordered.  Evaluation of Outcomes: Not Met   LCSW Treatment Plan for Primary Diagnosis: <principal problem not specified> Long Term Goal(s): Safe transition to appropriate next level of care at discharge, Engage patient in therapeutic group addressing interpersonal concerns.  Short Term Goals: Engage patient in aftercare planning with referrals and resources, Increase social support and Increase skills for wellness and recovery  Therapeutic Interventions: Assess for all discharge needs, 1 to 1 time with Social worker, Explore available resources and support systems, Assess for adequacy in community support network, Educate  family and significant other(s) on suicide prevention, Complete Psychosocial Assessment, Interpersonal group therapy.  Evaluation of Outcomes: Not Met   Progress in Treatment: Attending groups: No. Participating in groups: No. Taking medication as prescribed: Yes. Toleration medication: Yes. Family/Significant other contact made: No, will contact:  when given permission Patient understands diagnosis: Yes. Discussing patient identified problems/goals with staff: Yes. Medical problems stabilized or resolved: Yes. Denies suicidal/homicidal ideation: Yes. Issues/concerns per patient self-inventory: No. Other: none  New problem(s) identified: No, Describe:  none  New Short Term/Long Term Goal(s):  Patient Goals:  "To go to rehab"  Discharge Plan or Barriers:   Reason for Continuation of Hospitalization: Depression Medication stabilization  Estimated Length of Stay: 2-4 days.  Attendees: Patient: Shirley Murphy 02/24/2018   Physician: Dr. Parke Poisson, MD 02/24/2018   Nursing: Darrol Angel, RN 02/24/2018   RN Care Manager: 02/24/2018   Social Worker: Lurline Idol, LCSW 02/24/2018   Recreational Therapist:  02/24/2018   Other:  02/24/2018   Other:  02/24/2018   Other: 02/24/2018        Scribe for Treatment Team: Joanne Chars, Teller 02/24/2018 2:51 PM

## 2018-02-24 NOTE — Tx Team (Signed)
Initial Treatment Plan 02/24/2018 1:28 AM Shirley IonMinnie Sandiford ZOX:[id number]    PATIENT STRESSORS: Financial difficulties Legal issue Medication change or noncompliance Substance abuse   PATIENT STRENGTHS: Average or above average intelligence Communication skills General fund of knowledge   PATIENT IDENTIFIED PROBLEMS:   "I need to get into rehab. Straight from here. I can't stay clean if I leave and then go."    "I need to actually try and do my best this time."               DISCHARGE CRITERIA:  Improved stabilization in mood, thinking, and/or behavior Need for constant or close observation no longer present Reduction of life-threatening or endangering symptoms to within safe limits Withdrawal symptoms are absent or subacute and managed without 24-hour nursing intervention  PRELIMINARY DISCHARGE PLAN: Attend 12-step recovery group Outpatient therapy  PATIENT/FAMILY INVOLVEMENT: This treatment plan has been presented to and reviewed with the patient, Shirley Murphy, and/or family member.  The patient and family have been given the opportunity to ask questions and make suggestions.  Lawrence MarseillesFriedman, Katelyn Broadnax Eakes, RN 02/24/2018, 1:28 AM

## 2018-02-24 NOTE — Plan of Care (Signed)
  Problem: Safety: Goal: Periods of time without injury will increase Outcome: Progressing   Problem: Coping: Goal: Ability to demonstrate self-control will improve Outcome: Not Progressing  Pt unable to demonstrate self control. Pt observed laughing and engaging in the dayroom and hallway with peers but is constantly requesting medications for severe withdrawal symptoms.

## 2018-02-24 NOTE — Therapy (Signed)
Occupational Therapy Group Note  Date:  02/24/2018 Time:  3:02 PM  Group Topic/Focus:  Stress Management  Participation Level:  Active  Participation Quality:  Appropriate  Affect:  Flat  Cognitive:  Appropriate  Insight: Improving  Engagement in Group:  Engaged  Modes of Intervention:  Activity, Discussion, Education and Socialization  Additional Comments:    S: "I really like to do zen tangles, nothing has helped me more"  O: Stress management group completed to use as productive coping strategy, to help mitigate maladaptive coping to integrate in functional BADL/IADL when reintegrating into community.  Stress management tools worksheet completed to identify negative coping mechanisms and their short and long term effects vs positive coping mechanisms with demonstration. Coping strategies taught include: relaxation based- deep breathing, counting to 10, taking a 1 minute vacation, acceptance, stress balls, relaxation audio/video, visual/mental imagery. Positive mental attitude- gratitude, acceptance, cognitive reframing, positive self talk, anger management. Gratitude journaling handout and instruction also given. Adult coloring and relaxation tips worksheet given at end of session.   A: Pt presents to group halfway through session. Stress management tools completed (positive coping skills side, not present for negative). Pt sharing current practices that help her combat stress. Pt shares that zen tangles and coloring have helped her immensely. She also enjoys deep breathing and relaxation. She states she is familiar with a variety of activities to manage stress in a positive manner, but needs to practice her consistency. Eager to acquire handouts at end of session.    P: Pt provided with education on stress management activities to implement into daily routine. Handouts given to facilitate carryover when reintegrating into community    [facility] CenterKaylee Randilyn Murphy, [place]MSOT, OTR/L  [employer]iliated Computer ServicesKaylee  Chari Murphy 02/24/2018, 3:02 PM

## 2018-02-24 NOTE — [facility] Group Notes (Signed)
[facility] 02/24/2018 1:15pm  Type of Therapy: [facility] Presentation  Participation Level: Invited. Chose to remain in bed.   Rona RavensHeather S Sorrel Cassetta, LCSW 02/24/2018 2:47 PM
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
## 2018-02-24 NOTE — Progress Notes (Signed)
Pt did not attend wrap up group this eveing
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
## 2018-02-24 NOTE — Progress Notes (Signed)
Recreation Therapy Notes  Date: 7.10.19 Time: 0930 Location: [address] Dayroom  Group Topic: Stress Management  Goal Area(s) Addresses:  Patient will verbalize importance of using healthy stress management.  Patient will identify positive emotions associated with healthy stress management.   Intervention: Stress Management  Activity :  Guided Imagery.  LRT introduced the stress management technique of guided imagery.  LRT read Murphy script to allow patients to visualize being outside on [place] bright summer day.  Patients were to follow along as script was read.  Education:  Stress Management, Discharge Planning.   Education Outcome: Acknowledges edcuation/In group clarification offered/Needs additional education  Clinical Observations/Feedback: Pt did not attend group.    Joda Braatz, LRT/CTRS         Shirley Murphy 02/24/2018 12:04 PM 
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
## 2018-02-25 MED ORDER — IBUPROFEN 600 MG PO TABS
600.0000 mg | ORAL_TABLET | Freq: Four times a day (QID) | ORAL | Status: DC | PRN
  Administered 2018-02-25 – 2018-02-27 (×4): 600 mg via ORAL
  Filled 2018-02-25 (×3): qty 1
  Filled 2018-02-25: qty 3

## 2018-02-25 NOTE — Progress Notes (Signed)
[facility] MD Progress Note  02/25/2018 4:38 PM Shirley Murphy  MRN:  [id number] Subjective: Patient reports she is feeling better today, states she feels calmer, denies suicidal ideations, denies medication side effects. Objective: I have discussed case with treatment team and have met with patient. She is a 32 year old female known to [place] from prior admission to [facility] in June for substance abuse ,depression and suicidal ideations.  Was readmitted due to relapse, heavy/daily drinking, recent cocaine and opiate abuse, worsening depression. As above patient reports she is feeling significantly better today, and symptoms of withdrawal have abated although continues to describe some subjective anxiety and mild cramping/nausea.  No vomiting, tolerating p.o. intake well.  Currently denies suicidal ideations. Denies medication side effects. Behavior on unit in good control, socializing appropriately with peers in dayroom. Principal Problem: Polysubstance abuse, identifies alcohol as substance of choice, substance-induced mood disorder versus MDD Diagnosis:   Patient Active Problem List   Diagnosis Date Noted  . Severe recurrent major depression without psychotic features ([place]) [F33.2] 02/24/2018  . Chronic hepatitis C without hepatic coma ([place]) [B18.2] 02/23/2018  . Alcohol use disorder, severe, dependence ([place]) [F10.20] 02/02/2018  . MDD (major depressive disorder), recurrent severe, without psychosis ([place]) [F33.2] 02/01/2018  . Moderate cocaine use disorder ([place]) [F14.20] 06/12/2017  . Alcohol abuse with alcohol-induced mood disorder ([place]) [F10.14] 06/12/2017  . Substance induced mood disorder ([place]) [F19.94] 03/04/2017  . MDD (major depressive disorder), recurrent episode ([place]) [F33.9] 03/02/2017  . Suicide attempt ([place]) [T14.91XA] 10/15/2016  . HTN (hypertension), benign [I10] 10/15/2016  . Irregular heart beat [I49.9] 10/15/2016  . Suicidal ideation [R45.851] 10/15/2016  . Alcoholic intoxication  without complication ([place]) [Q46.962]    Total Time spent with patient: 20 minutes  Past Psychiatric History:  Past Medical History:  Past Medical History:  Diagnosis Date  . Chest pain   . Hepatitis C   . Hypertension   . Irregular heart beat     Past Surgical History:  Procedure Laterality Date  . c sections    . TUBAL LIGATION     Family History: History reviewed. No pertinent family history. Family Psychiatric  History:  Social History:  Social History   Substance and Sexual Activity  Alcohol Use Yes   Comment: case of beer a day     Social History   Substance and Sexual Activity  Drug Use Yes  . Types: Cocaine, Marijuana    Social History   Socioeconomic History  . Marital status: Legally Separated    Spouse name: Not on file  . Number of children: Not on file  . Years of education: Not on file  . Highest education level: Not on file  Occupational History  . Not on file  Social Needs  . Financial resource strain: Not on file  . Food insecurity:    Worry: Not on file    Inability: Not on file  . Transportation needs:    Medical: Not on file    Non-medical: Not on file  Tobacco Use  . Smoking status: Current Every Day Smoker    Packs/day: 1.00    Types: Cigarettes  . Smokeless tobacco: Never Used  Substance and Sexual Activity  . Alcohol use: Yes    Comment: case of beer a day  . Drug use: Yes    Types: Cocaine, Marijuana  . Sexual activity: Yes    Birth control/protection: None  Lifestyle  . Physical activity:    Days per week: Not on file  Minutes per session: Not on file  . Stress: Not on file  Relationships  . Social connections:    Talks on phone: Not on file    Gets together: Not on file    Attends religious service: Not on file    Active member of club or organization: Not on file    Attends meetings of clubs or organizations: Not on file    Relationship status: Not on file  Other Topics Concern  . Not on file  Social History  Narrative  . Not on file   Additional Social History:   Sleep: Fair  Appetite:  Improving  Current Medications: Current Facility-Administered Medications  Medication Dose Route Frequency Provider Last Rate Last Dose  . acetaminophen (TYLENOL) tablet 650 mg  650 mg Oral Q6H PRN Lindon Romp A, NP   650 mg at 02/24/18 2239  . alum & mag hydroxide-simeth (MAALOX/MYLANTA) 200-200-20 MG/5ML suspension 30 mL  30 mL Oral Q4H PRN Lindon Romp A, NP   30 mL at 02/24/18 2240  . ARIPiprazole (ABILIFY) tablet 2 mg  2 mg Oral Daily Cobos, Myer Peer, MD   2 mg at 02/25/18 0800  . chlordiazePOXIDE (LIBRIUM) capsule 25 mg  25 mg Oral Q6H PRN Cobos, Fernando A, MD      . chlordiazePOXIDE (LIBRIUM) capsule 25 mg  25 mg Oral TID Cobos, Myer Peer, MD   25 mg at 02/25/18 1155   Followed by  . [START ON 02/26/2018] chlordiazePOXIDE (LIBRIUM) capsule 25 mg  25 mg Oral BH-qamhs Cobos, Myer Peer, MD       Followed by  . [START ON 02/27/2018] chlordiazePOXIDE (LIBRIUM) capsule 25 mg  25 mg Oral Daily Cobos, Fernando A, MD      . gabapentin (NEURONTIN) capsule 100 mg  100 mg Oral TID Cobos, Myer Peer, MD   100 mg at 02/25/18 1155  . hydrOXYzine (ATARAX/VISTARIL) tablet 25 mg  25 mg Oral Q6H PRN Cobos, Myer Peer, MD   25 mg at 02/25/18 1507  . ibuprofen (ADVIL,MOTRIN) tablet 600 mg  600 mg Oral Q6H PRN Cobos, Myer Peer, MD   600 mg at 02/25/18 1507  . levothyroxine (SYNTHROID, LEVOTHROID) tablet 25 mcg  25 mcg Oral QAC breakfast Cobos, Myer Peer, MD   25 mcg at 02/25/18 0629  . loperamide (IMODIUM) capsule 2-4 mg  2-4 mg Oral PRN Cobos, Myer Peer, MD      . magnesium hydroxide (MILK OF MAGNESIA) suspension 30 mL  30 mL Oral Daily PRN Lindon Romp A, NP      . multivitamin with minerals tablet 1 tablet  1 tablet Oral Daily Cobos, Myer Peer, MD   1 tablet at 02/25/18 0800  . nicotine (NICODERM CQ - dosed in mg/24 hours) patch 21 mg  21 mg Transdermal Daily Cobos, Myer Peer, MD   21 mg at 02/25/18 0800  .  ondansetron (ZOFRAN-ODT) disintegrating tablet 4 mg  4 mg Oral Q6H PRN Cobos, Myer Peer, MD   4 mg at 02/25/18 0952  . prazosin (MINIPRESS) capsule 1 mg  1 mg Oral QHS Cobos, Myer Peer, MD   1 mg at 02/24/18 2239  . sertraline (ZOLOFT) tablet 50 mg  50 mg Oral Daily Cobos, Myer Peer, MD   50 mg at 02/25/18 0800  . thiamine (VITAMIN B-1) tablet 100 mg  100 mg Oral Daily Cobos, Myer Peer, MD   100 mg at 02/25/18 0800  . traZODone (DESYREL) tablet 50 mg  50 mg Oral QHS PRN  Rozetta Nunnery, NP   50 mg at 02/24/18 2239    Lab Results: No results found for this or any previous visit (from the past 20 hour(s)).  Blood Alcohol level:  Lab Results  Component Value Date   ETH <10 02/23/2018   ETH 60 (H) 46/27/0350    Metabolic Disorder Labs: No results found for: HGBA1C, MPG No results found for: PROLACTIN No results found for: CHOL, TRIG, HDL, CHOLHDL, VLDL, LDLCALC  Physical Findings: AIMS: Facial and Oral Movements Muscles of Facial Expression: None, normal Lips and Perioral Area: None, normal Jaw: None, normal Tongue: None, normal,Extremity Movements Upper (arms, wrists, hands, fingers): None, normal Lower (legs, knees, ankles, toes): None, normal, Trunk Movements Neck, shoulders, hips: None, normal, Overall Severity Severity of abnormal movements (highest score from questions above): None, normal Incapacitation due to abnormal movements: None, normal Patient's awareness of abnormal movements (rate only patient's report): No Awareness, Dental Status Current problems with teeth and/or dentures?: No Does patient usually wear dentures?: No  CIWA:  CIWA-Ar Total: 5 COWS:  COWS Total Score: 9  Musculoskeletal: Strength & Muscle Tone: within normal limits-no current distal tremors, no diaphoresis, no psychomotor restlessness or agitation Gait & Station: normal Patient leans: N/A  Psychiatric Specialty Exam: Physical Exam  ROS no chest pain, no shortness of breath, some nausea, no  vomiting, no fever, no chills  Blood pressure 130/88, pulse 76, temperature 98.2 F (36.8 C), temperature source Oral, resp. rate 18, height 5' 1.5" (1.562 m), weight 116.1 kg (256 lb).Body mass index is 47.59 kg/m.  General Appearance: Improving grooming  Eye Contact:  Good  Speech:  Normal Rate  Volume:  Normal  Mood:  Reports she feels better today, less depressed, less anxious  Affect:  More reactive, smiles at times appropriately  Thought Process:  Linear and Descriptions of Associations: Intact  Orientation:  Other:  Fully alert and attentive  Thought Content:  No hallucinations, no delusions expressed  Suicidal Thoughts:  No-at this time denies suicidal ideations, denies self-injurious ideations, contracts for safety on unit, denies violent or homicidal ideations  Homicidal Thoughts:  No  Memory:  Recent and remote grossly intact  Judgement:  Fair-improving  Insight:  Fair and Improving  Psychomotor Activity:  No current psychomotor restlessness or agitation, no significant distal tremors noted  Concentration:  Concentration: Good and Attention Span: Good  Recall:  Good  Fund of Knowledge:  Good  Language:  Good  Akathisia:  Negative  Handed:  Right  AIMS (if indicated):     Assets:  Desire for Improvement Resilience  ADL's:  Intact  Cognition:  WNL  Sleep:  Number of Hours: 3.25   Assessment -32 year old female, history of polysubstance abuse , identifies alcohol as substance of choice.  Presented due to worsening mood, depression, suicidal ideations in the context of relapse on alcohol and cocaine.  Currently reports feeling improved, symptoms of withdrawal have abated, is tolerating Librium detox protocol well thus far.  Currently no suicidal ideations.  Treatment Plan Summary: Daily contact with patient to assess and evaluate symptoms and progress in treatment, Medication management, Plan Inpatient treatment and Medications as below Encourage group and milieu  participation to work on coping skills and symptom reduction Encourage efforts to work on sobriety and relapse prevention Treatment team working on disposition planning-patient currently interested in going to a rehab at discharge Continue Librium detox protocol to minimize risk of severe withdrawal symptoms Continue Vistaril 25 mg every 6 hours PRN for anxiety as needed  Continue Abilify 2 mg daily for mood disorder/antidepressant augmentation Continue Zoloft 50 mg daily for depression and anxiety Continue Minipress 1 mg at nighttime for nightmares Continue Trazodone 50 mgrs QHS PRN for insomnia Jenne Campus, MD 02/25/2018, 4:38 PM

## 2018-02-25 NOTE — Plan of Care (Signed)
  Problem: Activity: Goal: Interest or engagement in activities will improve Outcome: Progressing   Problem: Safety: Goal: Periods of time without injury will increase Outcome: Progressing   Problem: Medication: Goal: Compliance with prescribed medication regimen will improve Outcome: Progressing  DAR NOTE: Patient presents with anxious affect and mood.  Denies suicidal thoughts, auditory and visual hallucinations.  Reports withdrawal symptoms of tremors, cravings, agitation, runny nose, chilling, cramping, nausea, and irritability on self inventory form.  Symptoms not congruent with affect and behavior.  Rates depression at 8, hopelessness at 8, and anxiety at 8.  Maintained on routine safety checks.  Medications given as prescribed.  Support and encouragement offered as needed.  Attended group and participated.  States goal for today is "resting."  Patient observed socializing with peers in the dayroom.  Requested and received Zofran 4 mg for complain of nausea with good effect.

## 2018-02-25 NOTE — [facility] Group Notes (Signed)
[facility] LCSW Group Therapy Note  Date/Time: 02/25/18, 1315  Type of Therapy/Topic:  Group Therapy:  Balance in Life  Participation Level:  Left early  Description of Group:    This group will address the concept of balance and how it feels and looks when one is unbalanced. Patients will be encouraged to process areas in their lives that are out of balance, and identify reasons for remaining unbalanced. Facilitators will guide patients utilizing problem- solving interventions to address and correct the stressor making their life unbalanced. Understanding and applying boundaries will be explored and addressed for obtaining  and maintaining a balanced life. Patients will be encouraged to explore ways to assertively make their unbalanced needs known to significant others in their lives, using other group members and facilitator for support and feedback.  Therapeutic Goals: 1. Patient will identify two or more emotions or situations they have that consume much of in their lives. 2. Patient will identify signs/triggers that life has become out of balance:  3. Patient will identify two ways to set boundaries in order to achieve balance in their lives:  4. Patient will demonstrate ability to communicate their needs through discussion and/or role plays  Summary of Patient Progress:PT came to group but left early.  She did make several comments prior to leaving and was engaged while present.          Therapeutic Modalities:   Cognitive Behavioral Therapy Solution-Focused Therapy Assertiveness Training  Daleen SquibbGreg Wilbern Pennypacker, [place]

## 2018-02-25 NOTE — [facility] Group Notes (Signed)
Adult Psychoeducational Group Note  Date:  02/25/2018 Time:  9:29 PM  Group Topic/Focus:  Wrap-Up Group:   The focus of this group is to help patients review their daily goal of treatment and discuss progress on daily workbooks.  Participation Level:  Active  Participation Quality:  Appropriate  Affect:  Appropriate  Cognitive:  Appropriate  Insight: Appropriate  Engagement in Group:  Developing/Improving  Modes of Intervention:  Discussion  Additional Comments:  Client shared that she was anxious after having to switch halls. Patient stated that she is focused on herself. She is waiting to hear back from [facility].   Lyndee HensenGoins, Kohei Antonellis R 02/25/2018, 9:29 PM

## 2018-02-25 NOTE — [facility] Group Notes (Signed)
Adult Psychoeducational Group Note  Date:  02/25/2018  Time: 4:00 PM  Group Topic/Focus: Barriers to Recovery  Participation Level:  Active  Participation Quality:  Appropriate and Attentive  Affect:  Appropriate  Cognitive:  Alert and Oriented  Insight: Improving  Engagement in Group:  Developing/Improving  Modes of Intervention:  Discussion and Education  Additional Comments:  Patient attended group, contributed positively to the discussion and was engaged/supportive of peers. Patient reports her barrier to recovery is "my toxic relationship and co-dependency".   Marchelle Folksmanda A Akshar Starnes 02/25/2018 5:00 PM

## 2018-02-25 NOTE — Progress Notes (Signed)
Patient ID: Shirley Murphy, female   DOB: January 26, 1986, 32 y.o.   MRN: [id number]  Nursing Progress Note [id number]  Data: Patient was transferred to the 300 hall per MD order. Report received from previous RN. Patient is adjusting to the milieu with no concerns. Patient is observed up in the dayroom interacting with peers. Patient did complain of a headache and anxiety requesting PRN medications. Patient compliant with scheduled medications.  Action: Patient educated about and provided medication per provider's orders. Patient safety maintained with q15 min safety checks and frequent rounding. Low fall risk precautions in place. Emotional support given. 1:1 interaction and active listening provided. Patient encouraged to attend meals and groups. Patient encouraged to work on treatment plan and goals. Labs, vital signs and patient behavior monitored throughout shift.   Response: Patient agrees to come to staff if any thoughts of SI/HI develop or if patient develops intention of acting on thoughts. Patient remains safe on the unit at this time. Patient is interacting with peers appropriately on the unit. Will continue to support and monitor.

## 2018-02-26 MED ORDER — ARIPIPRAZOLE 5 MG PO TABS: 5.0000 mg | ORAL_TABLET | Freq: Every day | ORAL | Status: DC

## 2018-02-26 MED ORDER — ARIPIPRAZOLE 5 MG PO TABS
5.0000 mg | ORAL_TABLET | Freq: Every day | ORAL | Status: DC
  Administered 2018-02-26 – 2018-02-27 (×2): 5 mg via ORAL
  Filled 2018-02-26 (×4): qty 1

## 2018-02-26 NOTE — Progress Notes (Signed)
Recreation Therapy Notes  Date: 7.12.19 Time: 0930 Location: 300 Hall Dayroom  Group Topic: Stress Management  Goal Area(s) Addresses:  Patient will verbalize importance of using healthy stress management.  Patient will identify positive emotions associated with healthy stress management.   Intervention: Stress Management  Activity :  Meditation.  LRT introduced the stress management technique of meditation.  LRT played a meditation on being resilient.  Patients were to listen to the meditation and follow along as meditation played.  Education:  Stress Management, Discharge Planning.   Education Outcome: Acknowledges edcuation/In group clarification offered/Needs additional education  Clinical Observations/Feedback: Pt did not attend group.    Caroll RancherMarjette Eboni Coval, LRT/CTRS         Caroll RancherLindsay, Ponciano Shealy A 02/26/2018 11:46 AM

## 2018-02-26 NOTE — Progress Notes (Signed)
Patient ID: Shirley Murphy, female   DOB: 01-08-1986, 32 y.o.   MRN: [id number]  Nursing Progress Note [id number]  Data: Patient provided 1700 medications as ordered per NP. Patient has been placed on Unit Restrcition. Patient provided but declined to complete their self-inventory sheet. Patient is seen visible in the milieu. Patient currently denies SI/HI/AVH. EKG performed and WNL.  Action: Patient educated about and provided medication per provider's orders. Patient safety maintained with q15 min safety checks and frequent rounding. Low fall risk precautions in place. Emotional support given. 1:1 interaction and active listening provided. Patient encouraged to attend meals and groups. Patient encouraged to work on treatment plan and goals. Labs, vital signs and patient behavior monitored throughout shift.   Response: Patient agrees to come to staff if any thoughts of SI/HI develop or if patient develops intention of acting on thoughts. Patient remains safe on the unit at this time. Patient is interacting with peers appropriately on the unit. Will continue to support and monitor.

## 2018-02-26 NOTE — Progress Notes (Signed)
Patient ID: Shirley IonMinnie Murphy, female   DOB: 1986-07-02, 32 y.o.   MRN: [id number]  Patient requested to speak to writer. Patient states, "I remember what it was now, it was Thorazine". Patient continues to identify the same individual. Patient asks, "who told you I took it? Was it him?" MD notified and verbal order received for EKG.

## 2018-02-26 NOTE — Progress Notes (Signed)
Patient ID: Shirley Murphy, female   DOB: Sep 11, 1985, 32 y.o.   MRN: [id number]  Patient was observed to be sleeping late this morning and did not get up for her 0800 medications despite encouragement from staff. Patient did get up at 1000 and was A&O x4. Patient reported she "slept bad" last night and was provided 0800 medications at 1018  without incident. Patient was observed up in the dayroom in no acute distress, went to the gym for rec therapy, and went to the cafeteria for lunch. Patient was interactive with her peers Patient then returned from the cafeteria and went to lay down in her room.  After lunch, it was reported to this Clinical research associatewriter by a patient from the 400 hall that this patient had stated at lunch she had taken pills from another patient.  Writer approached patient and found patient resting in bed. Patient appeared lethargic but answered questions appropriately. Patient reported she was "anxious", "having racing thoughts", and was "feeling horrible". Patient said this started last night and has continued into this morning. Patient reported, "I don't know what this is, I think it's withdrawal".  Patient was directly asked if she had taken anything given to her by another patient. Patient then stated, "why? Did someone say something?" Patient denied taking anything stating, "I've only had what ya'll have given me". Patient educated about impact of drug interactions and her medical team knowing what she had taken. Patient then stated, "yes, I took three 100mg  Haldol pills last night" and identified the patient who gave them to her. Patient became tearful and stated, "I'm not gonna get kicked out am I? I know I messed up I'm sorry". Patient declined to share anything further.  Charge nurse, Norwood LevoAC, MD, and NP notified. MD assessed patient with writer present. Patient continues to appear lethargic but neuro checks were WNL and is A&Ox4. Patient continues to deny taking medications not prescribed to  her today, stating "only last night".  VS obtained and WNL. See flowsheets. Room search completed by staff with no further findings. MD ordered q2hr neuro checks. Further medications to be held at this time.  Patient currently in the dayroom and is in no acute distress.

## 2018-02-26 NOTE — Progress Notes (Addendum)
[facility] MD Progress Note  02/26/2018 12:34 PM Shirley Murphy  MRN:  [id number] Subjective: Aggravated, irritable, and angry.  I am having cold sweats, some shakes, nausea, vomiting, nailbiting, and a bad headache.  This is day 3 for me and I feel terrible.  My goal today is to remain calm.  I also need to check into some places for me to go on the day mark I can stay up to 90 days.   Objective: I have discussed case with treatment team and have met with patient. She is a 32 year old female known to [place] from prior admission to [facility] with a history of 4 admissions in his 29-monthperiod.  Patient last admission was in June for substance abuse, depression, and suicidal ideation reports relapse day of discharge.  She presents to the hospital after stating her boyfriend kicked her out of the home and says she had one last chance to get it together.   Irritability, and psychomotor agitation was observed.  Patient appears to be endorsing suicidal ideations, depression, and irritability as for secondary gain noting that she had nowhere else to go and this was her last chance because she had been evicted from her home.  She states that she does not feel suicidal however when people say these things to her and makes her feel as though she is worth nothing and that she might as well kill herself.  Patient reports not having any symptoms of cravings,  Nightmares.  She reports resting well last night, and denies any eating disturbances at this time.  She reports her goal for today is to remain calm and check into other places in addition to a day mark.  Patient is aware that her previous 2 admissions she was transported to [facility] and accepted, however never made it to start the program.  Patient was provided transportation to [facility] on 2 separate occasions in treatment programming never started.  Patient is aware that she may be declined for day [facility] this admission due to previous history.  Writer was also advised by staff  that patient has taken another patient's alprazolam, and stated she had no choice but to take it.  This was discussed with attending physician, and contraband was not found however her story was credible.  Female patient was discharged, and will continue to reassess many at this time for discharge due to violation of unit policy and protocol.  At this time patient is not ready for mental health help, and or detox as noted due to ongoing manipulation, secondary gain, and taking contraband from another patient.  At the time of this evaluation patient denies suicidal ideations, homicidal ideations, and or auditory visual hallucinations.  However states she is unable to contract for safety if  discharged to the community. Behavior on unit in good control, socializing appropriately with peers in dayroom.   Principal Problem: Polysubstance abuse, identifies alcohol as substance of choice, substance-induced mood disorder versus MDD Diagnosis:   Patient Active Problem List   Diagnosis Date Noted  . Severe recurrent major depression without psychotic features (HPorum [F33.2] 02/24/2018  . Chronic hepatitis C without hepatic coma (HWebster [B18.2] 02/23/2018  . Alcohol use disorder, severe, dependence (HPercy [F10.20] 02/02/2018  . MDD (major depressive disorder), recurrent severe, without psychosis ([place] [F33.2] 02/01/2018  . Moderate cocaine use disorder (HSt. Bonifacius [F14.20] 06/12/2017  . Alcohol abuse with alcohol-induced mood disorder (HRaleigh [F10.14] 06/12/2017  . Substance induced mood disorder (H[place]ist[place] [F19.94] 03/04/2017  . MDD (major depressive disorder),  recurrent episode ([place]) [F33.9] 03/02/2017  . Suicide attempt ([place]) [T14.91XA] 10/15/2016  . HTN (hypertension), benign [I10] 10/15/2016  . Irregular heart beat [I49.9] 10/15/2016  . Suicidal ideation [R45.851] 10/15/2016  . Alcoholic intoxication without complication ([place]) [L46.503]    Total Time spent with patient: 20 minutes  Past Psychiatric History:history of  multiple psychiatric admissions since she was a teenager:as above, had recently been admitted to [facility] in  June 2019 for depression/substance abuse . Reports history of suicide attempts by overdosing and by trying to hang self in the past . Reports fleeting hallucinations at times during past  detoxifications from alcohol, but states no recent psychotic symptoms. History of PTSD but reports partial improvement overtime .Reports prior history of brief mood swings, mood instability, and states she has been diagnosed with Bipolar Disorder as a teenager. She states that her main issue is  long history of depression.     Past Medical History:  Past Medical History:  Diagnosis Date  . Chest pain   . Hepatitis C   . Hypertension   . Irregular heart beat     Past Surgical History:  Procedure Laterality Date  . c sections    . TUBAL LIGATION     Family History: History reviewed. No pertinent family history. Family Psychiatric  History:  Social History:  Social History   Substance and Sexual Activity  Alcohol Use Yes   Comment: case of beer a day     Social History   Substance and Sexual Activity  Drug Use Yes  . Types: Cocaine, Marijuana    Social History   Socioeconomic History  . Marital status: Legally Separated    Spouse name: Not on file  . Number of children: Not on file  . Years of education: Not on file  . Highest education level: Not on file  Occupational History  . Not on file  Social Needs  . Financial resource strain: Not on file  . Food insecurity:    Worry: Not on file    Inability: Not on file  . Transportation needs:    Medical: Not on file    Non-medical: Not on file  Tobacco Use  . Smoking status: Current Every Day Smoker    Packs/day: 1.00    Types: Cigarettes  . Smokeless tobacco: Never Used  Substance and Sexual Activity  . Alcohol use: Yes    Comment: case of beer a day  . Drug use: Yes    Types: Cocaine, Marijuana  . Sexual activity: Yes     Birth control/protection: None  Lifestyle  . Physical activity:    Days per week: Not on file    Minutes per session: Not on file  . Stress: Not on file  Relationships  . Social connections:    Talks on phone: Not on file    Gets together: Not on file    Attends religious service: Not on file    Active member of club or organization: Not on file    Attends meetings of clubs or organizations: Not on file    Relationship status: Not on file  Other Topics Concern  . Not on file  Social History Narrative  . Not on file   Additional Social History:   Sleep: Fair  Appetite:  Improving  Current Medications: Current Facility-Administered Medications  Medication Dose Route Frequency Provider Last Rate Last Dose  . acetaminophen (TYLENOL) tablet 650 mg  650 mg Oral Q6H PRN Rozetta Nunnery, NP  650 mg at 02/24/18 2239  . alum & mag hydroxide-simeth (MAALOX/MYLANTA) 200-200-20 MG/5ML suspension 30 mL  30 mL Oral Q4H PRN Lindon Romp A, NP   30 mL at 02/24/18 2240  . ARIPiprazole (ABILIFY) tablet 5 mg  5 mg Oral Daily Starkes, Gayland Curry, FNP      . chlordiazePOXIDE (LIBRIUM) capsule 25 mg  25 mg Oral Q6H PRN Cobos, Fernando A, MD      . chlordiazePOXIDE (LIBRIUM) capsule 25 mg  25 mg Oral BH-qamhs Cobos, Myer Peer, MD   25 mg at 02/26/18 1018   Followed by  . [START ON 02/27/2018] chlordiazePOXIDE (LIBRIUM) capsule 25 mg  25 mg Oral Daily Cobos, Fernando A, MD      . gabapentin (NEURONTIN) capsule 100 mg  100 mg Oral TID Cobos, Myer Peer, MD   100 mg at 02/26/18 1018  . hydrOXYzine (ATARAX/VISTARIL) tablet 25 mg  25 mg Oral Q6H PRN Cobos, Myer Peer, MD   25 mg at 02/25/18 2133  . ibuprofen (ADVIL,MOTRIN) tablet 600 mg  600 mg Oral Q6H PRN Cobos, Myer Peer, MD   600 mg at 02/26/18 1018  . levothyroxine (SYNTHROID, LEVOTHROID) tablet 25 mcg  25 mcg Oral QAC breakfast Cobos, Myer Peer, MD   25 mcg at 02/26/18 0645  . loperamide (IMODIUM) capsule 2-4 mg  2-4 mg Oral PRN Cobos, Myer Peer, MD       . magnesium hydroxide (MILK OF MAGNESIA) suspension 30 mL  30 mL Oral Daily PRN Lindon Romp A, NP      . multivitamin with minerals tablet 1 tablet  1 tablet Oral Daily Cobos, Myer Peer, MD   1 tablet at 02/26/18 1018  . nicotine (NICODERM CQ - dosed in mg/24 hours) patch 21 mg  21 mg Transdermal Daily Cobos, Myer Peer, MD   21 mg at 02/26/18 1019  . ondansetron (ZOFRAN-ODT) disintegrating tablet 4 mg  4 mg Oral Q6H PRN Cobos, Myer Peer, MD   4 mg at 02/25/18 0952  . prazosin (MINIPRESS) capsule 1 mg  1 mg Oral QHS Cobos, Myer Peer, MD   1 mg at 02/25/18 2133  . sertraline (ZOLOFT) tablet 50 mg  50 mg Oral Daily Cobos, Myer Peer, MD   50 mg at 02/26/18 1018  . thiamine (VITAMIN B-1) tablet 100 mg  100 mg Oral Daily Cobos, Myer Peer, MD   100 mg at 02/26/18 1018  . traZODone (DESYREL) tablet 50 mg  50 mg Oral QHS PRN Lindon Romp A, NP   50 mg at 02/25/18 2133    Lab Results: No results found for this or any previous visit (from the past 48 hour(s)).  Blood Alcohol level:  Lab Results  Component Value Date   ETH <10 02/23/2018   ETH 60 (H) 12/75/1700    Metabolic Disorder Labs: No results found for: HGBA1C, MPG No results found for: PROLACTIN No results found for: CHOL, TRIG, HDL, CHOLHDL, VLDL, LDLCALC  Physical Findings: AIMS: Facial and Oral Movements Muscles of Facial Expression: None, normal Lips and Perioral Area: None, normal Jaw: None, normal Tongue: None, normal,Extremity Movements Upper (arms, wrists, hands, fingers): None, normal Lower (legs, knees, ankles, toes): None, normal, Trunk Movements Neck, shoulders, hips: None, normal, Overall Severity Severity of abnormal movements (highest score from questions above): None, normal Incapacitation due to abnormal movements: None, normal Patient's awareness of abnormal movements (rate only patient's report): No Awareness, Dental Status Current problems with teeth and/or dentures?: No Does patient usually wear  dentures?:  No  CIWA:  CIWA-Ar Total: 1 COWS:  COWS Total Score: 9  Musculoskeletal: Strength & Muscle Tone: within normal limits-no current distal tremors, no diaphoresis, no psychomotor restlessness or agitation Gait & Station: normal Patient leans: N/A  Psychiatric Specialty Exam: Physical Exam   ROS  no chest pain, no shortness of breath, some nausea, no vomiting, no fever, no chills  Blood pressure (!) 167/80, pulse 85, temperature 98.2 F (36.8 C), temperature source Oral, resp. rate 18, height 5' 1.5" (1.562 m), weight 116.1 kg (256 lb).Body mass index is 47.59 kg/m.  General Appearance: Improving grooming  Eye Contact:  Good  Speech:  Normal Rate  Volume:  Normal  Mood:  Irritable and Worthless  Affect:  Blunt and Labile  Thought Process:  Linear and Descriptions of Associations: Intact  Orientation:  Other:  Fully alert and attentive  Thought Content:  Logical  Suicidal Thoughts:  No-at this time denies suicidal ideations, denies self-injurious ideations, contracts for safety on unit, denies violent or homicidal ideations  Homicidal Thoughts:  No  Memory:  Recent and remote grossly intact  Judgement:  Fair-improving  Insight:  Fair and Improving  Psychomotor Activity:  No current psychomotor restlessness or agitation, no significant distal tremors noted  Concentration:  Concentration: Good and Attention Span: Good  Recall:  Good  Fund of Knowledge:  Good  Language:  Good  Akathisia:  Negative  Handed:  Right  AIMS (if indicated):     Assets:  Desire for Improvement Resilience  ADL's:  Intact  Cognition:  WNL  Sleep:  Number of Hours: 6.5   Assessment -32 year old female, history of polysubstance abuse , identifies alcohol as substance of choice.  Presented due to worsening mood, depression, suicidal ideations in the context of relapse on alcohol and cocaine.  Currently reports feeling improved, symptoms of withdrawal have abated, is tolerating Librium detox  protocol well thus far.  Currently no suicidal ideations.  Treatment Plan Summary: Daily contact with patient to assess and evaluate symptoms and progress in treatment, Medication management, Plan Inpatient treatment and Medications as below.   Encourage group and milieu participation to work on Radiographer, therapeutic and symptom reduction Encourage efforts to work on sobriety and relapse prevention Treatment team working on disposition planning-patient currently interested in going to a rehab at discharge Continue Librium detox protocol to minimize risk of severe withdrawal symptoms Continue Vistaril 25 mg every 6 hours PRN for anxiety as needed Increase Abilify 5 mg daily for mood disorder/antidepressant augmentation, increase dose as of now.  Continue Zoloft 50 mg daily for depression and anxiety Continue Minipress 1 mg at nighttime for nightmares Continue Trazodone 50 mgrs QHS PRN for insomnia.  Will restrict to unit at this time due to unpredictable behaviors, receiving contraband from other patients, and continues to be a risk to self and others.  Will prepare to discharge patient as per unit protocol and policy. Nanci Pina, FNP 02/26/2018, 12:34 PM   ..Agree with NP Progress Note

## 2018-02-26 NOTE — Progress Notes (Signed)
Patient did attend the evening speaker AA meeting.  

## 2018-02-26 NOTE — BHH Group Notes (Signed)
LCSW Group Therapy Note 02/26/2018 3:12 PM  Type of Therapy and Topic: Group Therapy: Avoiding Self-Sabotaging and Enabling Behaviors  Participation Level: Did Not Attend  Description of Group:  In this group, patients will learn how to identify obstacles, self-sabotaging and enabling behaviors, as well as: what are they, why do we do them and what needs these behaviors meet. Discuss unhealthy relationships and how to have positive healthy boundaries with those that sabotage and enable. Explore aspects of self-sabotage and enabling in yourself and how to limit these self-destructive behaviors in everyday life.  Therapeutic Goals: 1. Patient will identify one obstacle that relates to self-sabotage and enabling behaviors 2. Patient will identify one personal self-sabotaging or enabling behavior they did prior to admission 3. Patient will state a plan to change the above identified behavior 4. Patient will demonstrate ability to communicate their needs through discussion and/or role play.   Summary of Patient Progress:  Invited, chose not to attend.     Therapeutic Modalities:  Cognitive Behavioral Therapy Person-Centered Therapy Motivational Interviewing   Baldo DaubJolan Ehan Freas LCSWA Clinical Social Worker

## 2018-02-26 NOTE — Plan of Care (Signed)
D: Pt denies SI/HI/AVH. Pt is pleasant and cooperative. Pt stated she was feeling upset and angry throughout the day with no clear motive. Pt seen minimally on the unit this evening  A: Pt was offered support and encouragement. Pt was given scheduled medications. Pt was encourage to attend groups. Q 15 minute checks were done for safety.   R: safety maintained on unit.   Problem: Education: Goal: Mental status will improve Outcome: Progressing   Problem: Activity: Goal: Sleeping patterns will improve Outcome: Progressing   Problem: Safety: Goal: Periods of time without injury will increase Outcome: Progressing

## 2018-02-27 ENCOUNTER — Encounter (HOSPITAL_COMMUNITY): Payer: Self-pay | Admitting: Emergency Medicine

## 2018-02-27 ENCOUNTER — Other Ambulatory Visit: Payer: Self-pay

## 2018-02-27 ENCOUNTER — Inpatient Hospital Stay (HOSPITAL_COMMUNITY)
Admission: EM | Admit: 2018-02-27 | Discharge: 2018-03-04 | DRG: 918 | Disposition: A | Discharge status: Home / Self Care | Payer: Medicaid - Out of State | Attending: Family Medicine | Admitting: Family Medicine

## 2018-02-27 DIAGNOSIS — Z79899 Other long term (current) drug therapy: Secondary | ICD-10-CM

## 2018-02-27 DIAGNOSIS — Y929 Unspecified place or not applicable: Secondary | ICD-10-CM

## 2018-02-27 DIAGNOSIS — Z915 Personal history of self-harm: Secondary | ICD-10-CM

## 2018-02-27 DIAGNOSIS — Z79890 Hormone replacement therapy (postmenopausal): Secondary | ICD-10-CM

## 2018-02-27 DIAGNOSIS — F1410 Cocaine abuse, uncomplicated: Secondary | ICD-10-CM | POA: Diagnosis present

## 2018-02-27 DIAGNOSIS — R400 Somnolence: Secondary | ICD-10-CM | POA: Diagnosis present

## 2018-02-27 DIAGNOSIS — T50902A Poisoning by unspecified drugs, medicaments and biological substances, intentional self-harm, initial encounter: Secondary | ICD-10-CM

## 2018-02-27 DIAGNOSIS — T50901A Poisoning by unspecified drugs, medicaments and biological substances, accidental (unintentional), initial encounter: Secondary | ICD-10-CM | POA: Diagnosis present

## 2018-02-27 DIAGNOSIS — Y900 Blood alcohol level of less than 20 mg/100 ml: Secondary | ICD-10-CM | POA: Diagnosis present

## 2018-02-27 DIAGNOSIS — B182 Chronic viral hepatitis C: Secondary | ICD-10-CM | POA: Diagnosis present

## 2018-02-27 DIAGNOSIS — L270 Generalized skin eruption due to drugs and medicaments taken internally: Secondary | ICD-10-CM | POA: Diagnosis not present

## 2018-02-27 DIAGNOSIS — E669 Obesity, unspecified: Secondary | ICD-10-CM | POA: Diagnosis present

## 2018-02-27 DIAGNOSIS — T401X2A Poisoning by heroin, intentional self-harm, initial encounter: Principal | ICD-10-CM | POA: Diagnosis present

## 2018-02-27 DIAGNOSIS — T50995A Adverse effect of other drugs, medicaments and biological substances, initial encounter: Secondary | ICD-10-CM | POA: Diagnosis not present

## 2018-02-27 DIAGNOSIS — F1910 Other psychoactive substance abuse, uncomplicated: Secondary | ICD-10-CM

## 2018-02-27 DIAGNOSIS — Z590 Homelessness: Secondary | ICD-10-CM

## 2018-02-27 DIAGNOSIS — T402X1A Poisoning by other opioids, accidental (unintentional), initial encounter: Secondary | ICD-10-CM | POA: Diagnosis present

## 2018-02-27 DIAGNOSIS — F4310 Post-traumatic stress disorder, unspecified: Secondary | ICD-10-CM | POA: Diagnosis present

## 2018-02-27 DIAGNOSIS — E039 Hypothyroidism, unspecified: Secondary | ICD-10-CM | POA: Diagnosis present

## 2018-02-27 DIAGNOSIS — H6690 Otitis media, unspecified, unspecified ear: Secondary | ICD-10-CM | POA: Diagnosis present

## 2018-02-27 DIAGNOSIS — Z91410 Personal history of adult physical and sexual abuse: Secondary | ICD-10-CM

## 2018-02-27 DIAGNOSIS — F1120 Opioid dependence, uncomplicated: Secondary | ICD-10-CM | POA: Diagnosis present

## 2018-02-27 DIAGNOSIS — R45851 Suicidal ideations: Secondary | ICD-10-CM

## 2018-02-27 DIAGNOSIS — F1010 Alcohol abuse, uncomplicated: Secondary | ICD-10-CM | POA: Diagnosis present

## 2018-02-27 DIAGNOSIS — F1994 Other psychoactive substance use, unspecified with psychoactive substance-induced mood disorder: Secondary | ICD-10-CM | POA: Diagnosis present

## 2018-02-27 DIAGNOSIS — F319 Bipolar disorder, unspecified: Secondary | ICD-10-CM | POA: Diagnosis present

## 2018-02-27 DIAGNOSIS — Z6841 Body Mass Index (BMI) 40.0 and over, adult: Secondary | ICD-10-CM

## 2018-02-27 DIAGNOSIS — G4700 Insomnia, unspecified: Secondary | ICD-10-CM | POA: Diagnosis present

## 2018-02-27 DIAGNOSIS — F17210 Nicotine dependence, cigarettes, uncomplicated: Secondary | ICD-10-CM | POA: Diagnosis present

## 2018-02-27 DIAGNOSIS — I10 Essential (primary) hypertension: Secondary | ICD-10-CM | POA: Diagnosis present

## 2018-02-27 DIAGNOSIS — R21 Rash and other nonspecific skin eruption: Secondary | ICD-10-CM | POA: Diagnosis not present

## 2018-02-27 DIAGNOSIS — Y92230 Patient room in hospital as the place of occurrence of the external cause: Secondary | ICD-10-CM | POA: Diagnosis not present

## 2018-02-27 MED ORDER — ACAMPROSATE CALCIUM 333 MG PO TBEC
666.0000 mg | DELAYED_RELEASE_TABLET | Freq: Three times a day (TID) | ORAL | Status: DC
  Filled 2018-02-27 (×2): qty 2

## 2018-02-27 MED ORDER — GABAPENTIN 100 MG PO CAPS
200.0000 mg | ORAL_CAPSULE | Freq: Three times a day (TID) | ORAL | Status: DC
  Filled 2018-02-27 (×2): qty 2

## 2018-02-27 MED ORDER — ARIPIPRAZOLE 5 MG PO TABS: 5.0000 mg | ORAL_TABLET | Freq: Every day | ORAL | 0 refills | Status: DC

## 2018-02-27 MED ORDER — SERTRALINE HCL 50 MG PO TABS: 50.0000 mg | ORAL_TABLET | Freq: Every day | ORAL | 0 refills | Status: DC

## 2018-02-27 MED ORDER — HYDROXYZINE HCL 25 MG PO TABS: 25.0000 mg | ORAL_TABLET | Freq: Four times a day (QID) | ORAL | 0 refills | Status: DC | PRN

## 2018-02-27 MED ORDER — LEVOTHYROXINE SODIUM 25 MCG PO TABS: 25.0000 ug | ORAL_TABLET | Freq: Every day | ORAL | 0 refills | Status: DC

## 2018-02-27 MED ORDER — GABAPENTIN 100 MG PO CAPS: 200.0000 mg | ORAL_CAPSULE | Freq: Three times a day (TID) | ORAL | 0 refills | Status: DC

## 2018-02-27 MED ORDER — PRAZOSIN HCL 1 MG PO CAPS: 1.0000 mg | ORAL_CAPSULE | Freq: Every day | ORAL | 0 refills | Status: DC

## 2018-02-27 MED ORDER — ACAMPROSATE CALCIUM 333 MG PO TBEC: 666.0000 mg | DELAYED_RELEASE_TABLET | Freq: Three times a day (TID) | ORAL | 0 refills | Status: DC

## 2018-02-27 NOTE — BHH Suicide Risk Assessment (Addendum)
[facility] Discharge Suicide Risk Assessment   Principal Problem: alcohol , opiate use disorder , depression  Discharge Diagnoses:  Patient Active Problem List   Diagnosis Date Noted  . Severe recurrent major depression without psychotic features (HCC) [F33.2] 02/24/2018  . Chronic hepatitis C without hepatic coma (HCC) [B18.2] 02/23/2018  . Alcohol use disorder, severe, dependence (HCC) [F10.20] 02/02/2018  . MDD (major depressive disorder), recurrent severe, without psychosis (HCC) [F33.2] 02/01/2018  . Moderate cocaine use disorder (HCC) [F14.20] 06/12/2017  . Alcohol abuse with alcohol-induced mood disorder (HCC) [F10.14] 06/12/2017  . Substance induced mood disorder (HCC) [F19.94] 03/04/2017  . MDD (major depressive disorder), recurrent episode (HCC) [F33.9] 03/02/2017  . Suicide attempt (HCC) [T14.91XA] 10/15/2016  . HTN (hypertension), benign [I10] 10/15/2016  . Irregular heart beat [I49.9] 10/15/2016  . Suicidal ideation [R45.851] 10/15/2016  . Alcoholic intoxication without complication (HCC) [F10.920]     Total Time spent with patient: 30 minutes  Musculoskeletal: Strength & Muscle Tone: within normal limits- no tremors, no diaphoresis , no restlessness or psychomotor agitation Gait & Station: normal Patient leans: N/A  Psychiatric Specialty Exam: ROS mild headache, no visual disturbances, no chest pain, no shortness of breath, no vomiting , no rash  Blood pressure (!) 139/91, pulse 98, temperature 98.2 F (36.8 C), temperature source Oral, resp. rate 18, height 5' 1.5" (1.562 m), weight 116.1 kg (256 lb).Body mass index is 47.59 kg/m.  General Appearance: Well Groomed  Eye Contact::  Good  Speech:  Normal Rate409  Volume:  Normal  Mood:  describes improved mood  Affect:  appropriate, vaguely anxious   Thought Process:  Linear and Descriptions of Associations: Intact  Orientation:  Other:  fully alert and attentive  Thought Content:  no hallucinations, no delusions, not  internally preoccupied   Suicidal Thoughts:  No denies suicidal or self injurious ideations , no homicidal or violent ideations  Homicidal Thoughts:  No  Memory:  recent and remote grossly intact   Judgement:  Other:  fair- improving   Insight: fair-   improving   Psychomotor Activity:  Normal- no current tremors, no psychomotor agitation, no restlessness, no diaphoresis  Concentration:  recent and remote grossly intact   Recall:  Good  Fund of Knowledge:Good  Language: Good  Akathisia:  Negative  Handed:  Right  AIMS (if indicated):   no abnormal or involuntary movements noted or reported   Assets:  Communication Skills Desire for Improvement Resilience  Sleep:  Number of Hours: 6.5  Cognition: WNL  ADL's:  Intact   Mental Status Per Nursing Assessment::   On Admission:  Suicidal ideation indicated by patient, Suicide plan, Self-harm thoughts, Self-harm behaviors, Intention to act on suicide plan  Demographic Factors:  Single, has three children who were adopted , currently unemployed  Loss Factors: Relapse, unemployment, relationship stressors  Historical Factors: History of prior psychiatric admissions, history of depression, history of depression, history of substance dependence ( reports alcohol is substance of choice )  Risk Reduction Factors:   Positive coping skills or problem solving skills  Continued Clinical Symptoms:  Patient is alert, attentive, calm, no current symptoms of WDL- no tremors, no diaphoresis, no restlessness- completed Librium detox protocol.  States she is feeling better, motivated in sobriety, and presents future oriented,states she plans to go live with sister, who " will let me stay at her property and then take me to rehab on the 19th. After I complete that I am going to try to go to [facility]. [facility]."  Denies suicidal or self injurious ideations, no homicidal ideations, no psychotic symptoms. Of  note, patient reported she had taken medication ( Thorazine ) that was offered by another patient two days ago. At this time denies any residual effects from this , presents fully alert , attentive, oriented x 3, vitals are stable. We have reviewed potential dangers/risks associated with taking non prescribed medication, patient acknowledges and expresses understanding . Patient interested in CAMPRAL trial for alcohol dependence- side effects reviewed . Also expresses interest in increasing Neurontin prior to discharge , as she feels that it helps her anxiety, is well tolerated .  Cognitive Features That Contribute To Risk:  No gross cognitive deficits noted upon discharge. Is alert , attentive, and oriented x 3   Suicide Risk:  Mild:  Suicidal ideation of limited frequency, intensity, duration, and specificity.  There are no identifiable plans, no associated intent, mild dysphoria and related symptoms, good self-control (both objective and subjective assessment), few other risk factors, and identifiable protective factors, including available and accessible social support.  Follow-up Information    [facility] Follow up.   Why:  Please attend your intake appt on Friday, 03/05/18, at 8:00am.  Please [place] photo ID and [place]. Contact information: [place] [place] [address] [phone]        [facility].   Specialty:  Behavioral Health Why:  Please attend your intake appt on Monday, 03/01/18 at 9:00am. Contact information: [address] [place] [address] [phone]           Plan Of Care/Follow-up recommendations:  Activity:  as tolerated  Diet:  regular Tests:  NA Other:  See below  Patient is expressing readiness for discharge, she wants to go stay with sister until [facility]. There are no current grounds for involuntary commitment .  Plans to follow up as above . Plans to  follow up with PCP for ongoing medical management , including ongoing management/monitoring of thyroid function.   Craige CottaFernando A Naksh Radi, MD 02/27/2018, 12:44 PM

## 2018-02-27 NOTE — Progress Notes (Signed)
  [facility]  PATIENT STATES SHE WILL NOT GO TO HER FOLLOW-UP APPOINTMENTS; MAY BE INTERESTED IN SHOWING UP AT HER [facility] APPOINTMENT HOWEVER.  CSW INFORMED HER THAT IF SHE DOES NOT GO TO [facility] AND GET HER MEDICATIONS, SHE WILL NOT BE ADMITTED TO [facility], BECAUSE THE POLICY IS TO ONLY ADMIT HER WITH 14-DAY SUPPLY.  SHE IS NOW UNDECIDED.  Will you be returning to the same living situation after discharge:  No.  She has been evicted from her boyfriend's home.  She is homeless and states she does not know if she can stay at her sister's house, but has to go there to pack a bag.  She will stay there one night if her sister's husband is not there. At discharge, do you have transportation home?: No.  Given a bus pass Do you have the ability to pay for your medications: No.  No income or insurance, [facility]  Release of information consent forms completed and turned in to Medical Records by CSW.  Patient to Follow up at: Follow-up Information    [facility] Follow up.   Why:  Please attend your intake appt on Friday, 03/05/18, at 8:00am.  Please [place] photo ID and [place]. Contact information: [place] [place] [address] [phone]        [facility]2018.   Specialty:  Behavioral Health Why:  Please attend your intake appt on Monday, 03/01/18 at 9:00am. Contact information: [address] [place] [address] [phone]           Next level of care provider [facility] Link:no  Safety Planning and Suicide Prevention discussed: Yes,  with boyfriend  Have you used any form of tobacco in the last 30 days? (Cigarettes, Smokeless Tobacco, Cigars, and/or Pipes): Yes  Has patient been referred to the Quitline?: Patient refused referral  Patient has been referred for addiction treatment: Yes  Lynnell ChadMareida J Grossman-Orr, LCSW 02/27/2018, 11:50 AM

## 2018-02-27 NOTE — ED Provider Notes (Signed)
[facility]-EMERGENCY DEPT Provider Note: Lowella DellJ. Lane Ramonica Grigg, MD, FACEP  CSN: [id number] MRN: [id number] ARRIVAL: 02/27/18 at 2303 ROOM: RESB/RESB   CHIEF COMPLAINT  Drug Overdose  Level 5 caveat: Altered mental status HISTORY OF PRESENT ILLNESS  02/27/18 11:15 PM Shirley Murphy is a 32 y.o. female with a history of drug use.  She was found unresponsive by her sister who called EMS.  They found the patient unresponsive with decreased respirations but not apnea.  She was administered 2 mg of Narcan intranasally with some improvement in her breathing.  She has been alert enough to be agitated and combative at times.  She has been stating she wishes she were dead and she wishes people would let her die.  Paraphernalia on scene suggest she has consumed possibly heroin, cocaine and alcohol.  Her initial oxygen saturation was 86% on room air which improved with a nonrebreather.  Her sister may have attempted chest compressions prior to EMS arrival.  She has had multiple admissions to the ED and behavioral health for overdoses, most recently an overdose on the ninth of this month with discharge from behavioral health on the 10th of this month.   Past Medical History:  Diagnosis Date  . Chest pain   . Hepatitis C   . Hypertension   . Irregular heart beat     Past Surgical History:  Procedure Laterality Date  . c sections    . TUBAL LIGATION      No family history on file.  Social History   Tobacco Use  . Smoking status: Current Every Day Smoker    Packs/day: 1.00    Types: Cigarettes  . Smokeless tobacco: Never Used  Substance Use Topics  . Alcohol use: Yes    Comment: case of beer a day  . Drug use: Yes    Types: Cocaine, Marijuana    Prior to Admission medications   Medication Sig Start Date End Date Taking? Authorizing Provider  acamprosate (CAMPRAL) 333 MG tablet Take 2 tablets (666 mg total) by mouth 3 (three) times daily with meals. 02/27/18   Truman HaywardStarkes, Takia S, FNP    ARIPiprazole (ABILIFY) 5 MG tablet Take 1 tablet (5 mg total) by mouth daily. 02/28/18   Truman HaywardStarkes, Takia S, FNP  gabapentin (NEURONTIN) 100 MG capsule Take 2 capsules (200 mg total) by mouth 3 (three) times daily. 02/27/18   Truman HaywardStarkes, Takia S, FNP  gabapentin (NEURONTIN) 300 MG capsule Take 1 capsule (300 mg total) by mouth 4 (four) times daily. For withdrawal symptoms 02/07/18   Money, Gerlene Burdockravis B, FNP  hydrOXYzine (ATARAX/VISTARIL) 25 MG tablet Take 1 tablet (25 mg total) by mouth every 6 (six) hours as needed for anxiety (or CIWA score </= 10). 02/27/18   Starkes, Juel Burrowakia S, FNP  levothyroxine (SYNTHROID, LEVOTHROID) 25 MCG tablet Take 1 tablet (25 mcg total) by mouth daily before breakfast. 02/28/18   Starkes, Juel Burrowakia S, FNP  prazosin (MINIPRESS) 1 MG capsule Take 1 capsule (1 mg total) by mouth at bedtime. 02/27/18   Truman HaywardStarkes, Takia S, FNP  sertraline (ZOLOFT) 50 MG tablet Take 1 tablet (50 mg total) by mouth daily. 02/28/18   Truman HaywardStarkes, Takia S, FNP  traZODone (DESYREL) 50 MG tablet Take 1 tablet (50 mg total) by mouth at bedtime as needed for sleep. Patient taking differently: Take 100 mg by mouth at bedtime as needed for sleep.  02/07/18   Money, Gerlene Burdockravis B, FNP    Allergies Other   REVIEW OF SYSTEMS  PHYSICAL EXAMINATION  Initial Vital Signs There were no vitals taken for this visit.  Examination General: Well-developed, obese female in no acute distress; appearance consistent with age of record HENT: normocephalic; atraumatic; gag intact; bite marks on tongue Eyes: Pupils pinpoint and sluggish Neck: supple Heart: regular rate and rhythm Lungs: clear to auscultation bilaterally Abdomen: soft; nondistended; bowel sounds present Extremities: No deformity; full range of motion; pulses normal Neurologic: Somnolent but arousable to noxious stimuli; noted to move all extremities equally Skin: Warm and dry Psychiatric: Expresses a desire to die when awake   RESULTS  Summary of this visit's  results, reviewed by myself:   EKG Interpretation  Date/Time:  Saturday February 27 2018 23:10:36 EDT Ventricular Rate:  98 PR Interval:    QRS Duration: 106 QT Interval:  348 QTC Calculation: 445 R Axis:   36 Text Interpretation:  Sinus rhythm Normal ECG No significant change was found Confirmed by Malone Vanblarcom ([id number]) on 02/27/2018 11:15:03 PM      Laboratory Studies: Results for orders placed or performed during the hospital encounter of 02/27/18 (from the past 24 hour(s))  Ethanol     Status: Abnormal   Collection Time: 02/27/18 11:23 PM  Result Value Ref Range   Alcohol, Ethyl (B) 13 (H) <10 mg/dL  Comprehensive metabolic panel     Status: Abnormal   Collection Time: 02/27/18 11:23 PM  Result Value Ref Range   Sodium 142 135 - 145 mmol/L   Potassium 4.0 3.5 - 5.1 mmol/L   Chloride 108 98 - 111 mmol/L   CO2 26 22 - 32 mmol/L   Glucose, Bld 113 (H) 70 - 99 mg/dL   BUN 13 6 - 20 mg/dL   Creatinine, Ser 1.91 0.44 - 1.00 mg/dL   Calcium 9.0 8.9 - 47.8 mg/dL   Total Protein 6.9 6.5 - 8.1 g/dL   Albumin 3.7 3.5 - 5.0 g/dL   AST 31 15 - 41 U/L   ALT 27 0 - 44 U/L   Alkaline Phosphatase 43 38 - 126 U/L   Total Bilirubin 0.3 0.3 - 1.2 mg/dL   GFR calc non Af Amer >60 >60 mL/min   GFR calc Af Amer >60 >60 mL/min   Anion gap 8 5 - 15  Salicylate level     Status: None   Collection Time: 02/27/18 11:23 PM  Result Value Ref Range   Salicylate Lvl <7.0 2.8 - 30.0 mg/dL  Acetaminophen level     Status: Abnormal   Collection Time: 02/27/18 11:23 PM  Result Value Ref Range   Acetaminophen (Tylenol), Serum <10 (L) 10 - 30 ug/mL  Blood gas, venous     Status: Abnormal   Collection Time: 02/27/18 11:30 PM  Result Value Ref Range   O2 Content 4.0 L/min   Delivery systems NASAL CANNULA    pH, Ven 7.322 7.250 - 7.430   pCO2, Ven 49.0 44.0 - 60.0 mmHg   pO2, Ven 81.8 (H) 32.0 - 45.0 mmHg   Bicarbonate 24.6 20.0 - 28.0 mmol/L   Acid-base deficit 1.1 0.0 - 2.0 mmol/L   O2 Saturation  92.9 %   Patient temperature 98.6    Collection site VEIN    Drawn by COLLECTED BY NURSE    Sample type VENOUS   Urinalysis, Routine w reflex microscopic     Status: None   Collection Time: 02/27/18 11:55 PM  Result Value Ref Range   Color, Urine YELLOW YELLOW   APPearance CLEAR CLEAR   Specific Gravity,  Urine 1.011 1.005 - 1.030   pH 5.0 5.0 - 8.0   Glucose, UA NEGATIVE NEGATIVE mg/dL   Hgb urine dipstick NEGATIVE NEGATIVE   Bilirubin Urine NEGATIVE NEGATIVE   Ketones, ur NEGATIVE NEGATIVE mg/dL   Protein, ur NEGATIVE NEGATIVE mg/dL   Nitrite NEGATIVE NEGATIVE   Leukocytes, UA NEGATIVE NEGATIVE  Rapid urine drug screen (hospital performed)     Status: Abnormal   Collection Time: 02/27/18 11:55 PM  Result Value Ref Range   Opiates POSITIVE (A) NONE DETECTED   Cocaine POSITIVE (A) NONE DETECTED   Benzodiazepines POSITIVE (A) NONE DETECTED   Amphetamines NONE DETECTED NONE DETECTED   Tetrahydrocannabinol NONE DETECTED NONE DETECTED   Barbiturates (A) NONE DETECTED    Result not available. Reagent lot number recalled by manufacturer.   Imaging Studies: No results found.  ED COURSE and MDM  Nursing notes and initial vitals signs, including pulse oximetry, reviewed.  Vitals:   02/27/18 2330 02/27/18 2354 02/28/18 0000 02/28/18 0044  BP: 126/62 126/62 112/62   Pulse: 83 77 77   Resp: (!) 22 10 10    Temp:   (!) 97.2 F (36.2 C)   SpO2: 97% 98% 98%   Weight:    111.1 kg (245 lb)  Height:    5\' 1"  (1.549 m)   1:18 AM Vital signs have been stable.  Patient still arousable to noxious stimuli, otherwise somnolent.  Will have patient admitted.  PROCEDURES   CRITICAL CARE Performed by: Paula Libra L Total critical care time: 35 minutes Critical care time was exclusive of separately billable procedures and treating other patients. Critical care was necessary to treat or prevent imminent or life-threatening deterioration. Critical care was time spent personally by me on the  following activities: development of treatment plan with patient and/or surrogate as well as nursing, discussions with consultants, evaluation of patient's response to treatment, examination of patient, obtaining history from patient or surrogate, ordering and performing treatments and interventions, ordering and review of laboratory studies, ordering and review of radiographic studies, pulse oximetry and re-evaluation of patient's condition.   ED DIAGNOSES     ICD-10-CM   1. Polysubstance abuse (HCC) F19.10   2. Intentional drug overdose, initial encounter (HCC) T50.902A        Paula Libra, MD 02/28/18 [phone]

## 2018-02-27 NOTE — ED Notes (Signed)
Bed: RESB Expected date:  Expected time:  Means of arrival:  Comments: 32 yo F/Heroine OD

## 2018-02-27 NOTE — Progress Notes (Signed)
Patient educated about follow up care, upcoming appointments reviewed. Patient verbalizes understanding of all follow up appointments. AVS and suicide safety plan reviewed. Patient expresses no concerns or questions at this time. Educated on prescriptions and medication regimen. Patient belongings returned. Patient denies SI, HI, AVH at this time. Educated patient about suicide help resources and hotline, encouraged to call for assistance in the event of a crisis. Patient agrees. Patient is ambulatory and safe at time of discharge. Patient discharged to hospital lobby at this time.  Update: Patient requests to be discharged without prescription sample Neurontin and prescription. Patient states that she will come back before 7:30 pm to pick this up. Charge RN made aware.

## 2018-02-27 NOTE — Discharge Summary (Addendum)
Physician Discharge Summary Note  Patient:  Shirley Murphy is an 32 y.o., female MRN:  [id number] DOB:  1985/10/29 Patient phone:  [phone] (home)  Patient address:   [address] [place] [address],  Total Time spent with patient: 20 minutes  Date of Admission:  02/24/2018 Date of Discharge: 02/27/18  Reason for Admission:  Suicide attempt by overdose of losartan  Principal Problem: <principal problem not specified> Discharge Diagnoses: Patient Active Problem List   Diagnosis Date Noted  . Severe recurrent major depression without psychotic features (HCC) [F33.2] 02/24/2018  . Chronic hepatitis C without hepatic coma (HCC) [B18.2] 02/23/2018  . Alcohol dependence with alcohol-induced mood disorder (HCC) [F10.24] 02/02/2018  . MDD (major depressive disorder), recurrent severe, without psychosis (HCC) [F33.2] 02/01/2018  . Moderate cocaine use disorder (HCC) [F14.20] 06/12/2017  . Alcohol abuse with alcohol-induced mood disorder (HCC) [F10.14] 06/12/2017  . Substance induced mood disorder (HCC) [F19.94] 03/04/2017  . MDD (major depressive disorder), recurrent episode (HCC) [F33.9] 03/02/2017  . Suicide attempt (HCC) [T14.91XA] 10/15/2016  . HTN (hypertension), benign [I10] 10/15/2016  . Irregular heart beat [I49.9] 10/15/2016  . Suicidal ideation [R45.851] 10/15/2016  . Alcoholic intoxication without complication (HCC) [F10.920]     Past Psychiatric History: Patient reports several prior psychiatric admissions, here at [facility] & at several other hospitals in the area. She reports history of several prior suicidal attempts starting in adolescence by overdose & self-cutting behaviors. Denies history of psychosis, endorses history of PTSD. Reports history of prior Bipolar Disorder diagnosis and describes brief mood swings in the past, but states she feels her psychiatric symptoms are at least partially related to alcohol/substance abuse. Denies history of violence . Patient had been  on Neurontin,  Zoloft, Trazodone, Nicotine Patch, Vistaril & Seroquel   Past Medical History:  Past Medical History:  Diagnosis Date  . Chest pain   . Hepatitis C   . Hypertension   . Irregular heart beat     Past Surgical History:  Procedure Laterality Date  . c sections    . TUBAL LIGATION     Family History: History reviewed. No pertinent family history. Family Psychiatric  History: Father & mother with history of alcohol and drug abuse, states she thinks father had bipolar disorder, father attempted suicide    Social History:  Social History   Substance and Sexual Activity  Alcohol Use Yes   Comment: case of beer a day     Social History   Substance and Sexual Activity  Drug Use Yes  . Types: Cocaine, Marijuana    Social History   Socioeconomic History  . Marital status: Legally Separated    Spouse name: Not on file  . Number of children: Not on file  . Years of education: Not on file  . Highest education level: Not on file  Occupational History  . Not on file  Social Needs  . Financial resource strain: Not on file  . Food insecurity:    Worry: Not on file    Inability: Not on file  . Transportation needs:    Medical: Not on file    Non-medical: Not on file  Tobacco Use  . Smoking status: Current Every Day Smoker    Packs/day: 1.00    Types: Cigarettes  . Smokeless tobacco: Never Used  Substance and Sexual Activity  . Alcohol use: Yes    Comment: case of beer a day  . Drug use: Yes    Types: Cocaine, Marijuana  . Sexual  activity: Yes    Birth control/protection: None  Lifestyle  . Physical activity:    Days per week: Not on file    Minutes per session: Not on file  . Stress: Not on file  Relationships  . Social connections:    Talks on phone: Not on file    Gets together: Not on file    Attends religious service: Not on file    Active member of club or organization: Not on file    Attends meetings of clubs or organizations: Not on file     Relationship status: Not on file  Other Topics Concern  . Not on file  Social History Narrative  . Not on file    Hospital Course:   32 year old female, known to our unit from prior psychiatric admission. She had been admitted from 6/18 through the 23/2019.At the time reported depression, suicidal ideations , substance abuse  . She was discharged on Minipress, Abilify, Neurontin, Zoloft. Patient reports that she relapsed a few days after her discharge, and has been drinking heavily and daily, up to 24 beers per day. She also endorses recent use of cocaine and has used heroin over the last 3-4 days only. States she has been feeling more depressed, and developed suicidal ideations of overdosing ( of note ED notes indicate patient overdosed on Gabapentin prior to admission, but she clarifies she had thoughts of overdose but did not actually overdose ),  States she had been waiting for a rehab slot, but relapsed before one became available . States " I need to get treatment, I can't stay sober on my own, I am going to end up dying or killing myself if I don't ". Endorses neuro-vegetative symptoms of depression as below.   Associated Signs/Symptoms: Depression Symptoms:  depressed mood, anhedonia, insomnia, suicidal thoughts with specific plan, loss of energy/fatigue, decreased appetite, (Hypo) Manic Symptoms:  Does not endorse or present with at this time Anxiety Symptoms:  Reports increased anxiety and some panic attacks Psychotic Symptoms:  Denies  PTSD Symptoms: Reports history of PTSD stemming from witnessing a friend overdose and die in front of her ( 2012) . Reports partial improvement overtime, but describes occasional nightmares and intrusive recollections. Total Time spent with patient: 45 minutes  Past Psychiatric History: history of multiple psychiatric admissions since she was a teenager:as above, [facility]  [facility] for depression/substance abuse .  Reports history of suicide attempts by overdosing and by trying to hang self in the past . Reports fleeting hallucinations at times during past  detoxifications from alcohol, but states no recent psychotic symptoms. History of PTSD but reports partial improvement overtime .Reports prior history of brief mood swings, mood instability, and states she has been diagnosed with Bipolar Disorder as a teenager. She states that her main issue is  long history of depression.    Physical Findings: AIMS: Facial and Oral Movements Muscles of Facial Expression: None, normal Lips and Perioral Area: None, normal Jaw: None, normal Tongue: None, normal,Extremity Movements Upper (arms, wrists, hands, fingers): None, normal Lower (legs, knees, ankles, toes): None, normal, Trunk Movements Neck, shoulders, hips: None, normal, Overall Severity Severity of abnormal movements (highest score from questions above): None, normal Incapacitation due to abnormal movements: None, normal Patient's awareness of abnormal movements (rate only patient's report): No Awareness, Dental Status Current problems with teeth and/or dentures?: No Does patient usually wear dentures?: No  CIWA:  CIWA-Ar Total: 1 COWS:  COWS Total  Score: 9  Musculoskeletal: Strength & Muscle Tone: within normal limits Gait & Station: normal Patient leans: N/A  Psychiatric Specialty Exam: Physical Exam  Nursing note and vitals reviewed. Constitutional: She is oriented to person, place, and time. She appears well-developed and well-nourished.  Cardiovascular: Normal rate.  Respiratory: Effort normal.  Musculoskeletal: Normal range of motion.  Neurological: She is alert and oriented to person, place, and time.  Skin: Skin is warm.    Review of Systems  Constitutional: Negative.   HENT: Negative.   Eyes: Negative.   Respiratory: Negative.   Cardiovascular: Negative.   Gastrointestinal: Negative.   Genitourinary: Negative.   Musculoskeletal:  Negative.   Skin: Negative.   Neurological: Negative.   Endo/Heme/Allergies: Negative.   Psychiatric/Behavioral: Negative.     Blood pressure (!) 139/91, pulse 98, temperature 98.2 F (36.8 C), temperature source Oral, resp. rate 18, height 5' 1.5" (1.562 m), weight 116.1 kg (256 lb).Body mass index is 47.59 kg/m.  General Appearance: Casual  Eye Contact:  Good  Speech:  Clear and Coherent and Normal Rate  Volume:  Normal  Mood:  Euthymic  Affect:  Congruent  Thought Process:  Goal Directed and Descriptions of Associations: Intact  Orientation:  Full (Time, Place, and Person)  Thought Content:  WDL  Suicidal Thoughts:  No  Homicidal Thoughts:  No  Memory:  Immediate;   Good Recent;   Good Remote;   Good  Judgement:  Good  Insight:  Good  Psychomotor Activity:  Normal  Concentration:  Concentration: Good and Attention Span: Good  Recall:  Good  Fund of Knowledge:  Good  Language:  Good  Akathisia:  No  Handed:  Right  AIMS (if indicated):     Assets:  Communication Skills Desire for Improvement Financial Resources/Insurance Housing Physical Health Social Support Transportation  ADL's:  Intact  Cognition:  WNL  Sleep:  Number of Hours: 6.5     Have you used any form of tobacco in the last 30 days? (Cigarettes, Smokeless Tobacco, Cigars, and/or Pipes): Yes  Has this patient used any form of tobacco in the last 30 days? (Cigarettes, Smokeless Tobacco, Cigars, and/or Pipes)  Yes, A prescription for an FDA-approved tobacco cessation medication was offered at discharge and the patient refused  Blood Alcohol level:  Lab Results  Component Value Date   ETH <10 02/23/2018   ETH 60 (H) 02/01/2018    Metabolic Disorder Labs:  No results found for: HGBA1C, MPG No results found for: PROLACTIN No results found for: CHOL, TRIG, HDL, CHOLHDL, VLDL, LDLCALC  See Psychiatric Specialty Exam and Suicide Risk Assessment completed by Attending Physician prior to  discharge.  Discharge destination:  Home  Is patient on multiple antipsychotic therapies at discharge:  No   Has Patient had three or more failed trials of antipsychotic monotherapy by history:  No  Recommended Plan for Multiple Antipsychotic Therapies: NA   Allergies as of 02/27/2018      Reactions   Other Other (See Comments)   benzoyl peroxide - used topically for acne cause facial and tongue swelling      Medication List    STOP taking these medications   ibuprofen 200 MG tablet Commonly known as:  ADVIL,MOTRIN   lidocaine 5 % Commonly known as:  LIDODERM     TAKE these medications     Indication  acamprosate 333 MG tablet Commonly known as:  CAMPRAL Take 2 tablets (666 mg total) by mouth 3 (three) times daily with meals.  Indication:  Excessive Use of Alcohol   ARIPiprazole 5 MG tablet Commonly known as:  ABILIFY Take 1 tablet (5 mg total) by mouth daily. Start taking on:  02/28/2018 What changed:    when to take this  additional instructions  Indication:  Major Depressive Disorder, Manic Phase of Manic-Depression   gabapentin 300 MG capsule Commonly known as:  NEURONTIN Take 1 capsule (300 mg total) by mouth 4 (four) times daily. For withdrawal symptoms  Indication:  Alcohol Withdrawal Syndrome   hydrOXYzine 25 MG tablet Commonly known as:  ATARAX/VISTARIL Take 1 tablet (25 mg total) by mouth every 6 (six) hours as needed for anxiety (or CIWA score </= 10). What changed:    medication strength  how much to take  reasons to take this  Indication:  Feeling Anxious   levothyroxine 25 MCG tablet Commonly known as:  SYNTHROID, LEVOTHROID Take 1 tablet (25 mcg total) by mouth daily before breakfast. Start taking on:  02/28/2018  Indication:  Underactive Thyroid   prazosin 1 MG capsule Commonly known as:  MINIPRESS Take 1 capsule (1 mg total) by mouth at bedtime. What changed:  additional instructions  Indication:  nightmares   sertraline 50 MG  tablet Commonly known as:  ZOLOFT Take 1 tablet (50 mg total) by mouth daily. Start taking on:  02/28/2018 What changed:  additional instructions  Indication:  Major Depressive Disorder   traZODone 50 MG tablet Commonly known as:  DESYREL Take 1 tablet (50 mg total) by mouth at bedtime as needed for sleep. What changed:  how much to take  Indication:  Trouble Sleeping      Follow-up Information    Services, [facility] Follow up.   Why:  Please attend your intake appt on Friday, 03/05/18, at 8:00am.  Please bring photo ID and proof of [facility] residency. Contact information: [address] [place] [address] [phone]        Vesta Mixer. Go on 03/01/2018.   Specialty:  Behavioral Health Why:  Please attend your intake appt on Monday, 03/01/18 at 9:00am. Contact information: [address] [place] [address] [phone]           Follow-up recommendations:  Continue activity as tolerated. Continue diet as recommended by your PCP. Ensure to keep all appointments with outpatient providers.  Comments:  Patient is instructed prior to discharge to: Take all medications as prescribed by his/her mental healthcare provider. Report any adverse effects and or reactions from the medicines to his/her outpatient provider promptly. Patient has been instructed & cautioned: To not engage in alcohol and or illegal drug use while on prescription medicines. In the event of worsening symptoms, patient is instructed to call the crisis hotline, 911 and or go to the nearest ED for appropriate evaluation and treatment of symptoms. To follow-up with his/her primary care provider for your other medical issues, concerns and or health care needs.    Signed: Truman Hayward, FNP 02/27/2018, 2:53 PM   Patient seen, Suicide Assessment Completed.  Disposition Plan Reviewed

## 2018-02-27 NOTE — Progress Notes (Signed)
Update: 1852: Patient returned to hospital to pick of sample medication and paper prescription of Neurontin. Medications/prescription given to patient without issue. Safe and ambulatory upon leaving.

## 2018-02-27 NOTE — [facility] Group Notes (Signed)
LCSW Group Therapy Note  02/27/2018   11:00am   Type of Therapy and Topic:  Group Therapy: Anger Cues and Responses  Participation Level:  Did Not Attend   Description of Group:   In this group, patients learned how to recognize the physical, cognitive, emotional, and behavioral responses they have to anger-provoking situations.  They identified a recent time they became angry and how they reacted.  They analyzed how their reaction was possibly beneficial and how it was possibly unhelpful.  The group discussed a variety of healthier coping skills that could help with such a situation in the future.  Deep breathing was practiced briefly.  Therapeutic Goals: 1. Patients will remember their last incident of anger and how they felt emotionally and physically, what their thoughts were at the time, and how they behaved. 2. Patients will identify how their behavior at that time worked for them, as well as how it worked against them. 3. Patients will explore possible new behaviors to use in future anger situations. 4. Patients will learn that anger itself is normal and cannot be eliminated, and that healthier reactions can assist with resolving conflict rather than worsening situations.  Summary of Patient Progress:  The patient did not attend group. Therapeutic Modalities:   Cognitive Behavioral Therapy  Evorn Gongonnie D Anaiah Mcmannis

## 2018-02-28 ENCOUNTER — Encounter (HOSPITAL_COMMUNITY): Payer: Self-pay | Admitting: Internal Medicine

## 2018-02-28 ENCOUNTER — Other Ambulatory Visit: Payer: Self-pay

## 2018-02-28 DIAGNOSIS — R45851 Suicidal ideations: Secondary | ICD-10-CM

## 2018-02-28 DIAGNOSIS — T50901A Poisoning by unspecified drugs, medicaments and biological substances, accidental (unintentional), initial encounter: Secondary | ICD-10-CM | POA: Diagnosis present

## 2018-02-28 LAB — BLOOD GAS, VENOUS
Acid-base deficit: 1.1 mmol/L (ref 0.0–2.0)
BICARBONATE: 24.6 mmol/L (ref 20.0–28.0)
O2 CONTENT: 4 L/min
O2 Saturation: 92.9 %
PATIENT TEMPERATURE: 98.6
pCO2, Ven: 49 mmHg (ref 44.0–60.0)
pH, Ven: 7.322 (ref 7.250–7.430)
pO2, Ven: 81.8 mmHg — ABNORMAL HIGH (ref 32.0–45.0)

## 2018-02-28 LAB — COMPREHENSIVE METABOLIC PANEL
ALK PHOS: 43 U/L (ref 38–126)
ALK PHOS: 43 U/L (ref 38–126)
ALT: 27 U/L (ref 0–44)
ALT: 29 U/L (ref 0–44)
AST: 31 U/L (ref 15–41)
AST: 32 U/L (ref 15–41)
Albumin: 3.7 g/dL (ref 3.5–5.0)
Albumin: 3.6 g/dL (ref 3.5–5.0)
Anion gap: 8 (ref 5–15)
Anion gap: 9 (ref 5–15)
BILIRUBIN TOTAL: 0.2 mg/dL — AB (ref 0.3–1.2)
BUN: 13 mg/dL (ref 6–20)
BUN: 15 mg/dL (ref 6–20)
CALCIUM: 9 mg/dL (ref 8.9–10.3)
CALCIUM: 8.9 mg/dL (ref 8.9–10.3)
CHLORIDE: 108 mmol/L (ref 98–111)
CHLORIDE: 105 mmol/L (ref 98–111)
CO2: 26 mmol/L (ref 22–32)
CO2: 26 mmol/L (ref 22–32)
CREATININE: 0.88 mg/dL (ref 0.44–1.00)
CREATININE: 0.79 mg/dL (ref 0.44–1.00)
GFR calc Af Amer: >60 mL/min (ref >60)
GFR calc Af Amer: >60 mL/min (ref >60)
GFR calc non Af Amer: >60 mL/min (ref >60)
Glucose, Bld: 113 mg/dL — ABNORMAL HIGH (ref 70–99)
Glucose, Bld: 106 mg/dL — ABNORMAL HIGH (ref 70–99)
Potassium: 4 mmol/L (ref 3.5–5.1)
Potassium: 4.6 mmol/L (ref 3.5–5.1)
SODIUM: 142 mmol/L (ref 135–145)
Sodium: 140 mmol/L (ref 135–145)
TOTAL PROTEIN: 6.7 g/dL (ref 6.5–8.1)
Total Bilirubin: 0.3 mg/dL (ref 0.3–1.2)
Total Protein: 6.9 g/dL (ref 6.5–8.1)

## 2018-02-28 LAB — ACETAMINOPHEN LEVEL: Acetaminophen (Tylenol), Serum: <10 ug/mL — ABNORMAL LOW (ref 10–30)

## 2018-02-28 LAB — RAPID URINE DRUG SCREEN, HOSP PERFORMED
Amphetamines: NOT DETECTED
Benzodiazepines: POSITIVE — AB
Cocaine: POSITIVE — AB
OPIATES: POSITIVE — AB
Tetrahydrocannabinol: NOT DETECTED

## 2018-02-28 LAB — URINALYSIS, ROUTINE W REFLEX MICROSCOPIC
Bilirubin Urine: NEGATIVE
Glucose, UA: NEGATIVE mg/dL
HGB URINE DIPSTICK: NEGATIVE
KETONES UR: NEGATIVE mg/dL
LEUKOCYTES UA: NEGATIVE
Nitrite: NEGATIVE
PROTEIN: NEGATIVE mg/dL
Specific Gravity, Urine: 1.011 (ref 1.005–1.030)
pH: 5 (ref 5.0–8.0)

## 2018-02-28 LAB — CBC
HCT: 34.1 % — ABNORMAL LOW (ref 36.0–46.0)
Hemoglobin: 10.5 g/dL — ABNORMAL LOW (ref 12.0–15.0)
MCH: 25.5 pg — ABNORMAL LOW (ref 26.0–34.0)
MCHC: 30.8 g/dL (ref 30.0–36.0)
MCV: 83 fL (ref 78.0–100.0)
Platelets: 247 10*3/uL (ref 150–400)
RBC: 4.11 MIL/uL (ref 3.87–5.11)
RDW: 16.4 % — AB (ref 11.5–15.5)
WBC: 13.3 10*3/uL — AB (ref 4.0–10.5)

## 2018-02-28 LAB — ETHANOL: ALCOHOL ETHYL (B): 13 mg/dL — AB (ref <10)

## 2018-02-28 LAB — SALICYLATE LEVEL: Salicylate Lvl: <7 mg/dL (ref 2.8–30.0)

## 2018-02-28 MED ORDER — SERTRALINE HCL 50 MG PO TABS
50.0000 mg | ORAL_TABLET | Freq: Every day | ORAL | Status: DC
  Administered 2018-02-28 – 2018-03-04 (×5): 50 mg via ORAL
  Filled 2018-02-28 (×5): qty 1

## 2018-02-28 MED ORDER — ACETAMINOPHEN 325 MG PO TABS
650.0000 mg | ORAL_TABLET | Freq: Four times a day (QID) | ORAL | Status: DC | PRN
  Administered 2018-02-28: 650 mg via ORAL
  Filled 2018-02-28: qty 2

## 2018-02-28 MED ORDER — LORAZEPAM 1 MG PO TABS
1.0000 mg | ORAL_TABLET | Freq: Once | ORAL | Status: AC
  Administered 2018-02-28: 1 mg via ORAL
  Filled 2018-02-28: qty 1

## 2018-02-28 MED ORDER — ARIPIPRAZOLE 5 MG PO TABS
5.0000 mg | ORAL_TABLET | Freq: Every day | ORAL | Status: DC
  Administered 2018-02-28 – 2018-03-04 (×5): 5 mg via ORAL
  Filled 2018-02-28 (×5): qty 1

## 2018-02-28 MED ORDER — IBUPROFEN 200 MG PO TABS
600.0000 mg | ORAL_TABLET | Freq: Four times a day (QID) | ORAL | Status: DC | PRN
  Administered 2018-02-28 – 2018-03-02 (×8): 600 mg via ORAL
  Filled 2018-02-28 (×8): qty 3

## 2018-02-28 MED ORDER — ENOXAPARIN SODIUM 60 MG/0.6ML ~~LOC~~ SOLN
55.0000 mg | Freq: Every day | SUBCUTANEOUS | Status: DC
  Filled 2018-02-28 (×2): qty 0.6
  Filled 2018-02-28: qty 0.55

## 2018-02-28 MED ORDER — TRAZODONE HCL 100 MG PO TABS
100.0000 mg | ORAL_TABLET | Freq: Every evening | ORAL | Status: DC | PRN
  Administered 2018-02-28 – 2018-03-03 (×4): 100 mg via ORAL
  Filled 2018-02-28 (×4): qty 1

## 2018-02-28 MED ORDER — SODIUM CHLORIDE 0.9 % IV SOLN
INTRAVENOUS | Status: AC
  Administered 2018-02-28: 02:00:00 via INTRAVENOUS

## 2018-02-28 MED ORDER — LEVOTHYROXINE SODIUM 25 MCG PO TABS
25.0000 ug | ORAL_TABLET | Freq: Every day | ORAL | Status: DC
  Administered 2018-02-28 – 2018-03-04 (×5): 25 ug via ORAL
  Filled 2018-02-28 (×5): qty 1

## 2018-02-28 MED ORDER — ONDANSETRON HCL 4 MG/2ML IJ SOLN
4.0000 mg | Freq: Four times a day (QID) | INTRAMUSCULAR | Status: DC | PRN
  Administered 2018-02-28 – 2018-03-02 (×2): 4 mg via INTRAVENOUS
  Filled 2018-02-28 (×2): qty 2

## 2018-02-28 MED ORDER — GABAPENTIN 300 MG PO CAPS
300.0000 mg | ORAL_CAPSULE | Freq: Four times a day (QID) | ORAL | Status: DC
  Administered 2018-02-28 – 2018-03-04 (×16): 300 mg via ORAL
  Filled 2018-02-28 (×16): qty 1

## 2018-02-28 MED ORDER — NICOTINE 21 MG/24HR TD PT24
21.0000 mg | MEDICATED_PATCH | Freq: Every day | TRANSDERMAL | Status: DC
  Administered 2018-02-28 – 2018-03-04 (×5): 21 mg via TRANSDERMAL
  Filled 2018-02-28 (×5): qty 1

## 2018-02-28 NOTE — H&P (Signed)
[facility] H&P   Patient Demographics:    Shirley Murphy, is a 32 y.o. female  MRN: [id number]   DOB - [birth year]  Admit Date - 02/27/2018  Outpatient Primary MD for the patient is Patient, No Pcp Per  Referring MD/NP/PA: Paula LibraJohn Molpus  Outpatient Specialists:   Patient coming from: home  Chief Complaint  Patient presents with  . Drug Overdose      HPI:    Shirley Murphy  is a 32 y.o. female, w hypertension, Hep C, opioid dep who presents with c/o heroin overdose.    In Ed, pt is somnolent and ED request admission.   Etoh 13,  Na 142, K 4.0, Bun13, Creatinine 0.88 Ast 31, Alt 27  Salicylate <7 Tylenol <10 PH 7.322, Po2 81.8 PCo2 49 UDS opiate +, coccaine +, benzo +  Pt will be admitted for opioid overdose as well as possible suicidal ideation     Review of systems:    In addition to the HPI above,  No Fever-chills, No Headache, No changes with Vision or hearing, No problems swallowing food or Liquids, No Chest pain, Cough or Shortness of Breath, No Abdominal pain, No Nausea or Vommitting, Bowel movements are regular, No Blood in stool or Urine, No dysuria, No new skin rashes or bruises, No new joints pains-aches,  No new weakness, tingling, numbness in any extremity, No recent weight gain or loss, No polyuria, polydypsia or polyphagia, No significant Mental Stressors.  A full 10 point Review of Systems was done, except as stated above, all other Review of Systems were negative.   With Past History of the following :    Past Medical History:  Diagnosis Date  . Chest pain   . Hepatitis C   . Hypertension   . Irregular heart beat       Past Surgical History:  Procedure Laterality Date  . c sections    . TUBAL LIGATION        Social History:     Social History   Tobacco Use  . Smoking status: Current Every Day Smoker    Packs/day: 1.00     Types: Cigarettes  . Smokeless tobacco: Never Used  Substance Use Topics  . Alcohol use: Yes    Comment: case of beer a day     Lives - at home  Mobility - walks by self   Family History :    No family history on file. Unable to obtain due to somnolence   Home Medications:   Prior to Admission medications   Medication Sig Start Date End Date Taking? Authorizing Provider  acamprosate (CAMPRAL) 333 MG tablet Take 2 tablets (666 mg total) by mouth 3 (three) times daily with meals. 02/27/18   Truman HaywardStarkes, Takia S, FNP  ARIPiprazole (ABILIFY) 5 MG tablet Take 1 tablet (5 mg total) by mouth daily. 02/28/18   Truman HaywardStarkes, Takia S, FNP  gabapentin (NEURONTIN) 100 MG capsule Take 2 capsules (200 mg total) by mouth 3 (three) times daily. 02/27/18   Truman Hayward, FNP  gabapentin (NEURONTIN) 300 MG capsule Take 1 capsule (300 mg total) by mouth 4 (four) times daily. For withdrawal symptoms 02/07/18   Money, Gerlene Burdock, FNP  hydrOXYzine (ATARAX/VISTARIL) 25 MG tablet Take 1 tablet (25 mg total) by mouth every 6 (six) hours as needed for anxiety (or CIWA score </= 10). 02/27/18   Starkes, Juel Burrow, FNP  levothyroxine (SYNTHROID, LEVOTHROID) 25 MCG tablet Take 1 tablet (25 mcg total) by mouth daily before breakfast. 02/28/18   Starkes, Juel Burrow, FNP  prazosin (MINIPRESS) 1 MG capsule Take 1 capsule (1 mg total) by mouth at bedtime. 02/27/18   Truman Hayward, FNP  sertraline (ZOLOFT) 50 MG tablet Take 1 tablet (50 mg total) by mouth daily. 02/28/18   Truman Hayward, FNP  traZODone (DESYREL) 50 MG tablet Take 1 tablet (50 mg total) by mouth at bedtime as needed for sleep. Patient taking differently: Take 100 mg by mouth at bedtime as needed for sleep.  02/07/18   Money, Gerlene Burdock, FNP     Allergies:     Allergies  Allergen Reactions  . Other Other (See Comments)    benzoyl peroxide - used topically for acne cause facial and tongue swelling     Physical Exam:   Vitals  Blood pressure 120/71, pulse  72, temperature (!) 97 F (36.1 C), temperature source Bladder, resp. rate 10, height 5\' 1"  (1.549 m), weight 111.1 kg (245 lb), SpO2 100 %.   1. General  lying in bed in NAD,   2. Normal affect and insight,  Somnolent, Alert, Oriented X 3.  3. No F.N deficits, ALL C.Nerves Intact, Strength 5/5 all 4 extremities, Sensation intact all 4 extremities, Plantars down going.  4. Ears and Eyes appear Normal, Conjunctivae clear, PERRLA. Moist Oral Mucosa.  5. Supple Neck, No JVD, No cervical lymphadenopathy appriciated, No Carotid Bruits.  6. Symmetrical Chest wall movement, Good air movement bilaterally, CTAB.  7. RRR, No Gallops, Rubs or Murmurs, No Parasternal Heave.  8. Positive Bowel Sounds, Abdomen Soft, No tenderness, No organomegaly appriciated,No rebound -guarding or rigidity.  9.  No Cyanosis, Normal Skin Turgor, No Skin Rash or Bruise.  10. Good muscle tone,  joints appear normal , no effusions, Normal ROM.  11. No Palpable Lymph Nodes in Neck or Axillae      Data Review:    CBC Recent Labs  Lab 02/23/18 1314  WBC 8.1  HGB 10.6*  HCT 34.4*  PLT 302  MCV 82.7  MCH 25.5*  MCHC 30.8  RDW 16.6*   ------------------------------------------------------------------------------------------------------------------  Chemistries  Recent Labs  Lab 02/23/18 1314 02/27/18 2323  NA 138 142  K 3.4* 4.0  CL 106 108  CO2 22 26  GLUCOSE 102* 113*  BUN 10 13  CREATININE 0.79 0.88  CALCIUM 9.1 9.0  MG 1.9  --   AST 22 31  ALT 21 27  ALKPHOS 58 43  BILITOT 0.3 0.3   ------------------------------------------------------------------------------------------------------------------ estimated creatinine clearance is 107.9 mL/min (by C-G formula based on SCr of 0.88 mg/dL). ------------------------------------------------------------------------------------------------------------------ No results for input(s): TSH, T4TOTAL, T3FREE, THYROIDAB in the last 72  hours.  Invalid input(s): FREET3  Coagulation profile No results for input(s): INR, PROTIME in the last 168 hours. ------------------------------------------------------------------------------------------------------------------- No results for input(s): DDIMER in the last 72 hours. -------------------------------------------------------------------------------------------------------------------  Cardiac Enzymes No results for input(s): CKMB, TROPONINI, MYOGLOBIN  in the last 168 hours.  Invalid input(s): CK ------------------------------------------------------------------------------------------------------------------ No results found for: BNP   ---------------------------------------------------------------------------------------------------------------  Urinalysis    Component Value Date/Time   COLORURINE YELLOW 02/27/2018 2355   APPEARANCEUR CLEAR 02/27/2018 2355   LABSPEC 1.011 02/27/2018 2355   PHURINE 5.0 02/27/2018 2355   GLUCOSEU NEGATIVE 02/27/2018 2355   HGBUR NEGATIVE 02/27/2018 2355   BILIRUBINUR NEGATIVE 02/27/2018 2355   KETONESUR NEGATIVE 02/27/2018 2355   PROTEINUR NEGATIVE 02/27/2018 2355   NITRITE NEGATIVE 02/27/2018 2355   LEUKOCYTESUR NEGATIVE 02/27/2018 2355    ----------------------------------------------------------------------------------------------------------------   Imaging Results:    No results found.     Assessment & Plan:    Active Problems:   Overdose    Overdose on heroin suspected NPO Ns iv Monitor Narcan prn  Suicidal ideation   1:1 direct observation Please consult psychiatry in the AM.   Depression Cont zoloft 50mg  po qday Cont Trazodone 50mg  po qhs prn Cont Abilify 5mg  po qday  Hypothyroidism Cont Levothyroxoine  PTSD Cont prazosin once more awake  ?Chronic pain Cont Gabapentin once patient is more awake  Consider restart acamprosate once more stable  DVT Prophylaxis  Lovenox - SCDs  AM Labs  Ordered, also please review Full Orders  Family Communication: Admission, patients condition and plan of care including tests being ordered have been discussed with the patient  who indicate understanding and agree with the plan and Code Status.  Code Status  FULL CODE  Likely DC to  home  Condition GUARDED    Consults called: none  Admission status: inpatient   Time spent in minutes : 70   Pearson Grippe M.D on 02/28/2018 at 1:30 AM  Between 7am to 7pm - Pager - [phone] After 7pm go to [email or web address] - password [facility]  [facility] - Office  [phone]

## 2018-02-28 NOTE — Progress Notes (Signed)
[facility]   Patient admitted after midnight see H&P for further details.  Patient seen, examined and chart reviewed.  32 year old female with history of hypertension, hep C and polysubstance abuse presented to the emergency department after having a drug overdose with heroine.  Patient was requesting admission as she was trying to kill herself with heroine.  UDS positive for opiates, cocaine and benzos.  EtOH 13 with normal LFTs.  Patient was given Narcan and admitted for further observation.  Patient this morning more awake and alert, complaining of ear pain.  Still suicidal she reported that she want to kill herself every day as she is a burden to her family.  No other concerns.  Plan: Okay to resume diet, continue 1:1 sitter, psych consulted, continue current medical treatment.  ER examined and found  to have otitis media, unsure if she was shivering through her ears.  Will start Augmentin twice daily.  Rest per H&P.  Latrelle DodrillEdwin Silva, MD

## 2018-02-28 NOTE — ED Notes (Signed)
Spoke with admitting MD, alerting him to improvement in pts status. Requesting bed status change. He will assess pt and make a decision.

## 2018-02-28 NOTE — Progress Notes (Signed)
Rx Brief note: Lovenox  Wt=111 kg, CrCl~107 ml/min, BMI=46  Rx adjusted Lovenox to 55 mg daily in pt with BMI>30  Thanks Lorenza EvangelistGreen, Jeilyn Reznik R 02/28/2018 1:54 AM

## 2018-02-28 NOTE — ED Notes (Signed)
She suddenly becomes angry and tells us she is going to leave. Dr. Denton LankSteinl speaks with her, and she decides to stay. She had removed her own IV, which I dressed. Also, Dr. Denton LankSteinl has us remove her foley cath. We get her a sandwich. Report given and we take her to the floor. Dr. Edward JollySilva notified of pt. Behavior.

## 2018-02-28 NOTE — ED Notes (Signed)
ED TO INPATIENT HANDOFF REPORT  Name/Age/Gender Shirley Murphy 32 y.o. female  Code Status    Code Status Orders  (From admission, onward)        Start     Ordered   02/28/18 0128  Full code  Continuous     02/28/18 0128    Code Status History    Date Active Date Inactive Code Status Order ID Comments User Context   02/24/2018 0118 02/27/2018 1944 Full Code [id number]  Rozetta Nunnery, NP Inpatient   02/23/2018 1607 02/24/2018 0046 Full Code [id number]  Margarita Mail, PA-C ED   02/01/2018 2159 02/07/2018 1447 Full Code [id number]  Derrill Center, NP Inpatient   02/01/2018 0558 02/01/2018 2149 Full Code [id number]  Orpah Greek, MD ED   09/01/2017 0350 09/01/2017 2017 Full Code [id number]  Delora Fuel, MD ED   08/04/2017 0536 08/06/2017 1147 Full Code [id number]  Larene Pickett, PA-C ED   07/05/2017 0030 07/05/2017 1944 Full Code [id number]  Merryl Hacker, MD ED   06/12/2017 0229 06/12/2017 1659 Full Code [id number]  Ward, Delice Bison, DO ED   03/02/2017 1544 03/05/2017 1534 Full Code [id number]  Niel Hummer, NP HOV   02/11/2017 1129 02/19/2017 0927 Full Code [id number]  Ethelene Hal, NP Inpatient   02/11/2017 0145 02/11/2017 1015 Full Code [id number]  Merryl Hacker, MD ED   10/17/2016 1643 10/21/2016 2033 Full Code [id number]  Ethelene Hal, NP Inpatient   10/15/2016 0724 10/17/2016 1532 Full Code [id number]  Reyne Dumas, MD ED      Home/SNF/Other Home  Chief Complaint Overdose  Level of Care/Admitting Diagnosis ED Disposition    ED Disposition Condition [place]: [facility] [[id number]]  Level of Care: Telemetry [5]  Admit to tele based on following criteria: Monitor for Ischemic changes  Diagnosis: Drug overdose [456256]  Admitting Physician: Patrecia Pour, EDWIN [[id number]]  Attending Physician: Patrecia Pour, EDWIN [[id number]]  PT Class (Do Not Modify): Observation [104]  PT Acc Code (Do Not Modify): Observation  [10022]       Medical History Past Medical History:  Diagnosis Date  . Chest pain   . Hepatitis C   . Hypertension   . Irregular heart beat     Allergies Allergies  Allergen Reactions  . Other Other (See Comments)    benzoyl peroxide - used topically for acne cause facial and tongue swelling    IV Location/Drains/Wounds Patient Lines/Drains/Airways Status   Active Line/Drains/Airways    Name:   Placement date:   Placement time:   Site:   Days:   Peripheral IV 02/01/18 Left Antecubital   02/01/18    0221    Antecubital   27   Urethral Catheter Sonya Lamb Straight-tip 14 Fr.   02/27/18    2353    Straight-tip   1          Labs/Imaging Results for orders placed or performed during the hospital encounter of 02/27/18 (from the past 48 hour(s))  Ethanol     Status: Abnormal   Collection Time: 02/27/18 11:23 PM  Result Value Ref Range   Alcohol, Ethyl (B) 13 (H) <10 mg/dL    Comment: (NOTE) Lowest detectable limit for serum alcohol is 10 mg/dL. For medical purposes only. Performed at [facility], [place] [address], [place], [place] [address]   Comprehensive metabolic panel     Status: Abnormal   Collection Time: 02/27/18 11:23 PM  Result  Value Ref Range   Sodium 142 135 - 145 mmol/L   Potassium 4.0 3.5 - 5.1 mmol/L   Chloride 108 98 - 111 mmol/L    Comment: Please note change in reference range.   CO2 26 22 - 32 mmol/L   Glucose, Bld 113 (H) 70 - 99 mg/dL    Comment: Please note change in reference range.   BUN 13 6 - 20 mg/dL    Comment: Please note change in reference range.   Creatinine, Ser 0.88 0.44 - 1.00 mg/dL   Calcium 9.0 8.9 - 10.3 mg/dL   Total Protein 6.9 6.5 - 8.1 g/dL   Albumin 3.7 3.5 - 5.0 g/dL   AST 31 15 - 41 U/L   ALT 27 0 - 44 U/L    Comment: Please note change in reference range.   Alkaline Phosphatase 43 38 - 126 U/L   Total Bilirubin 0.3 0.3 - 1.2 mg/dL   GFR calc non Af Amer >60 >60 mL/min   GFR calc Af Amer >60 >60  mL/min    Comment: (NOTE) The eGFR has been calculated using the CKD EPI equation. This calculation has not been validated in all clinical situations. eGFR's persistently <60 mL/min signify possible Chronic Kidney Disease.    Anion gap 8 5 - 15    Comment: Performed at [facility], [place] [address], [place], [place] [address]  Salicylate level     Status: None   Collection Time: 02/27/18 11:23 PM  Result Value Ref Range   Salicylate Lvl <4.1 2.8 - 30.0 mg/dL    Comment: Performed at [facility], [place] [address], [place], [place] [address]  Acetaminophen level     Status: Abnormal   Collection Time: 02/27/18 11:23 PM  Result Value Ref Range   Acetaminophen (Tylenol), Serum <10 (L) 10 - 30 ug/mL    Comment: (NOTE) Therapeutic concentrations vary significantly. A range of 10-30 ug/mL  may be an effective concentration for many patients. However, some  are best treated at concentrations outside of this range. Acetaminophen concentrations >150 ug/mL at 4 hours after ingestion  and >50 ug/mL at 12 hours after ingestion are often associated with  toxic reactions. Performed at [facility], [place] [address] [place], [place] [address]   Blood gas, venous     Status: Abnormal   Collection Time: 02/27/18 11:30 PM  Result Value Ref Range   O2 Content 4.0 L/min   Delivery systems NASAL CANNULA    pH, Ven 7.322 7.250 - 7.430   pCO2, Ven 49.0 44.0 - 60.0 mmHg   pO2, Ven 81.8 (H) 32.0 - 45.0 mmHg   Bicarbonate 24.6 20.0 - 28.0 mmol/L   Acid-base deficit 1.1 0.0 - 2.0 mmol/L   O2 Saturation 92.9 %   Patient temperature 98.6    Collection site VEIN    Drawn by COLLECTED BY NURSE    Sample type VENOUS     Comment: Performed at [place] [address] [place],  [address]  Urinalysis, Routine w reflex microscopic     Status: None   Collection Time: 02/27/18 11:55 PM  Result Value Ref Range   Color, Urine  YELLOW YELLOW   APPearance CLEAR CLEAR   Specific Gravity, Urine 1.011 1.005 - 1.030   pH 5.0 5.0 - 8.0   Glucose, UA NEGATIVE NEGATIVE mg/dL   Hgb urine dipstick NEGATIVE NEGATIVE   Bilirubin Urine NEGATIVE NEGATIVE   Ketones, ur NEGATIVE NEGATIVE  mg/dL   Protein, ur NEGATIVE NEGATIVE mg/dL   Nitrite NEGATIVE NEGATIVE   Leukocytes, UA NEGATIVE NEGATIVE    Comment: Performed at [facility], [place] [address] [place], [place] [address]  Rapid urine drug screen (hospital performed)     Status: Abnormal   Collection Time: 02/27/18 11:55 PM  Result Value Ref Range   Opiates POSITIVE (A) NONE DETECTED   Cocaine POSITIVE (A) NONE DETECTED   Benzodiazepines POSITIVE (A) NONE DETECTED   Amphetamines NONE DETECTED NONE DETECTED   Tetrahydrocannabinol NONE DETECTED NONE DETECTED   Barbiturates (A) NONE DETECTED    Result not available. Reagent lot number recalled by manufacturer.    Comment: Performed at [facility], [place] [address], [place], [place] [address]  Comprehensive metabolic panel     Status: Abnormal   Collection Time: 02/28/18  5:15 AM  Result Value Ref Range   Sodium 140 135 - 145 mmol/L   Potassium 4.6 3.5 - 5.1 mmol/L   Chloride 105 98 - 111 mmol/L    Comment: Please note change in reference range.   CO2 26 22 - 32 mmol/L   Glucose, Bld 106 (H) 70 - 99 mg/dL    Comment: Please note change in reference range.   BUN 15 6 - 20 mg/dL    Comment: Please note change in reference range.   Creatinine, Ser 0.79 0.44 - 1.00 mg/dL   Calcium 8.9 8.9 - 10.3 mg/dL   Total Protein 6.7 6.5 - 8.1 g/dL   Albumin 3.6 3.5 - 5.0 g/dL   AST 32 15 - 41 U/L   ALT 29 0 - 44 U/L    Comment: Please note change in reference range.   Alkaline Phosphatase 43 38 - 126 U/L   Total Bilirubin 0.2 (L) 0.3 - 1.2 mg/dL   GFR calc non Af Amer >60 >60 mL/min   GFR calc Af Amer >60 >60 mL/min    Comment: (NOTE) The eGFR has been calculated using the CKD EPI  equation. This calculation has not been validated in all clinical situations. eGFR's persistently <60 mL/min signify possible Chronic Kidney Disease.    Anion gap 9 5 - 15    Comment: Performed at [facility], [place] [address] [place], [place] [address]  CBC     Status: Abnormal   Collection Time: 02/28/18  5:15 AM  Result Value Ref Range   WBC 13.3 (H) 4.0 - 10.5 K/uL   RBC 4.11 3.87 - 5.11 MIL/uL   Hemoglobin 10.5 (L) 12.0 - 15.0 g/dL   HCT 34.1 (L) 36.0 - 46.0 %   MCV 83.0 78.0 - 100.0 fL   MCH 25.5 (L) 26.0 - 34.0 pg   MCHC 30.8 30.0 - 36.0 g/dL   RDW 16.4 (H) 11.5 - 15.5 %   Platelets 247 150 - 400 K/uL    Comment: Performed at [facility], [place] [address], [place], [place] [address]   No results found.  Pending Labs Unresulted Labs (From admission, onward)   Start     Ordered   03/07/18 0500  Creatinine, serum  (enoxaparin (LOVENOX)    CrCl >/= 30 ml/min)  Weekly,   R    Comments:  while on enoxaparin therapy    02/28/18 0128      Vitals/Pain Today's Vitals   02/28/18 0500 02/28/18 0530 02/28/18 0600 02/28/18 0630  BP: 133/86 137/77 (!) 142/80 125/74  Pulse: 92 80 80   Resp: 19 14  13 12  Temp: 99.1 F (37.3 C) 99.3 F (37.4 C) 99 F (37.2 C) 99 F (37.2 C)  TempSrc:      SpO2: 100% 97% 96%   Weight:      Height:      PainSc:        Isolation Precautions No active isolations  Medications Medications  enoxaparin (LOVENOX) injection 55 mg (has no administration in time range)  0.9 %  sodium chloride infusion ( Intravenous Transfusing/Transfer 02/28/18 0924)  ondansetron (ZOFRAN) injection 4 mg (4 mg Intravenous Given 02/28/18 0516)    Mobility walks

## 2018-03-01 MED ORDER — CHLORDIAZEPOXIDE HCL 5 MG PO CAPS
10.0000 mg | ORAL_CAPSULE | Freq: Three times a day (TID) | ORAL | Status: AC
  Administered 2018-03-01 (×3): 10 mg via ORAL
  Filled 2018-03-01 (×3): qty 2

## 2018-03-01 MED ORDER — CHLORDIAZEPOXIDE HCL 5 MG PO CAPS
5.0000 mg | ORAL_CAPSULE | Freq: Three times a day (TID) | ORAL | Status: AC
  Administered 2018-03-02 (×3): 5 mg via ORAL
  Filled 2018-03-01 (×3): qty 1

## 2018-03-01 MED ORDER — CHLORDIAZEPOXIDE HCL 5 MG PO CAPS
5.0000 mg | ORAL_CAPSULE | Freq: Two times a day (BID) | ORAL | Status: AC
  Administered 2018-03-03 (×2): 5 mg via ORAL
  Filled 2018-03-01 (×3): qty 1

## 2018-03-01 MED ORDER — AMOXICILLIN-POT CLAVULANATE 875-125 MG PO TABS
1.0000 | ORAL_TABLET | Freq: Two times a day (BID) | ORAL | Status: DC
Start: 2018-03-01 — End: 2018-03-02
  Administered 2018-03-01 (×2): 1 via ORAL
  Filled 2018-03-01 (×3): qty 1

## 2018-03-01 MED ORDER — POLYETHYLENE GLYCOL 3350 17 G PO PACK
17.0000 g | PACK | Freq: Every day | ORAL | Status: DC
  Administered 2018-03-01 – 2018-03-04 (×4): 17 g via ORAL
  Filled 2018-03-01 (×5): qty 1

## 2018-03-01 MED ORDER — CHLORDIAZEPOXIDE HCL 5 MG PO CAPS
5.0000 mg | ORAL_CAPSULE | Freq: Every day | ORAL | Status: DC
  Administered 2018-03-04: 5 mg via ORAL
  Filled 2018-03-01: qty 1

## 2018-03-01 NOTE — Consult Note (Signed)
[facility] Consult   Reason for Consult:  Overdose Referring Physician: Attending physician Patient Identification: Shirley Murphy MRN:  [id number] Principal Diagnosis: Overdose Diagnosis:   Patient Active Problem List   Diagnosis Date Noted  . Overdose [T50.901A] 02/28/2018  . Drug overdose [T50.901A] 02/28/2018  . Severe recurrent major depression without psychotic features ([place]) [F33.2] 02/24/2018  . Chronic hepatitis C without hepatic coma ([place]) [B18.2] 02/23/2018  . Alcohol dependence with alcohol-induced mood disorder ([place]) [F10.24] 02/02/2018  . MDD (major depressive disorder), recurrent severe, without psychosis ([place]) [F33.2] 02/01/2018  . Moderate cocaine use disorder ([place]) [F14.20] 06/12/2017  . Alcohol abuse with alcohol-induced mood disorder ([place]) [F10.14] 06/12/2017  . Substance induced mood disorder ([place]) [F19.94] 03/04/2017  . MDD (major depressive disorder), recurrent episode ([place]) [F33.9] 03/02/2017  . Suicide attempt ([place]) [T14.91XA] 10/15/2016  . HTN (hypertension), benign [I10] 10/15/2016  . Irregular heart beat [I49.9] 10/15/2016  . Suicidal ideation [R45.851] 10/15/2016  . Alcoholic intoxication without complication ([place]) [K09.381]     Total Time spent with patient: 15 minutes  Subjective:   Shirley Murphy is a 32 y.o. female.  Awake alert and oriented x3.  Seen resting in bed with sitter at bedside Naaman Plummer).Chart review you patient was recently discharged from inpatient admission at [facility] on 02/27/2018.  Reports after her  discharge patient had a heroin overdose.  While she is currently denying suicidal or homicidal ideations during this assessment.  Patient is requesting for long-term residential treatment for substance abuse use and depression. Reports " I am unable to stay sober on my on, I need help, or I am going to die."  Reports she was scheduled a follow-up appointment with [facility] on 03/03/2018 for residential treatment.   However reports she is unable to get sample medications that was provided for her at previous discharge on 02/27/2018.  Shirley Murphy reports she is eager to get back on track and is willing to go to [facility] residential treatment appointment.  Continues to report she is struggling with detoxing and alcohol cravings.  Rates her depression 10 out of 10 with 10 being the worst.  NP discussed inpatient admission after medically cleared.  Support encouragement reassurance was provided  Narrative notes by attending Physician: 03/01/2018  [name] is a 32 year old female with history of hypertension, hep C and polysubstance abuse presented to the emergency department after having a drug overdose with heroine. Patient was requesting admission as she was trying to kill herself with heroine. UDS positive for opiates, cocaine and benzos. EtOH 13 with normal LFTs. Patient was given Narcan and admitted    Per inpatient assessment note:32 year old female, known to our unit from prior psychiatric admission. She had been admitted from 6/18 through the 23/2019.At the time reported depression, suicidal ideations , substance abuse  . She was discharged on Minipress, Abilify, Neurontin, Zoloft. Patient reports that she relapsed a few days after her discharge, and has been drinking heavily and daily, up to 24 beers per day. She also endorses recent use of cocaine and has used heroin over the last 3-4 days only. States she has been feeling more depressed, and developed suicidal ideations of overdosing ( of note ED notes indicate patient overdosed on Gabapentin prior to admission, but she clarifies she had thoughts of overdose but did not actually overdose ), States she had been waiting for a rehab slot, but relapsed before one became available . States " I need to get treatment, I can't stay sober on my own,  I am going to end up dying or killing myself if I don't ".Endorses neuro-vegetative symptoms of depression as  below    Past Psychiatric History: per previous assessment notes: history of multiple psychiatric admissions since she was a teenager:as above, had recently been admitted to [facility] in  June 2019 for depression/substance abuse . Reports history of suicide attempts by overdosing and by trying to hang self in the past . Reports fleeting hallucinations at times during past  detoxifications from alcohol, but states no recent psychotic symptoms. History of PTSD but reports partial improvement overtime .Reports prior history of brief mood swings, mood instability, and states she has been diagnosed with Bipolar Disorder as a teenager. She states that her main issue is  long history of depression.     Risk to Self:   Risk to Others:   Prior Inpatient Therapy:   Prior Outpatient Therapy:    Past Medical History:  Past Medical History:  Diagnosis Date  . Chest pain   . Hepatitis C   . Hypertension   . Irregular heart beat     Past Surgical History:  Procedure Laterality Date  . c sections    . TUBAL LIGATION     Family History:  Family History  Family history unknown: Yes   Family Psychiatric  History:  Social History:  Social History   Substance and Sexual Activity  Alcohol Use Yes   Comment: case of beer a day     Social History   Substance and Sexual Activity  Drug Use Yes  . Types: Cocaine, Marijuana    Social History   Socioeconomic History  . Marital status: Legally Separated    Spouse name: Not on file  . Number of children: Not on file  . Years of education: Not on file  . Highest education level: Not on file  Occupational History  . Not on file  Social Needs  . Financial resource strain: Not on file  . Food insecurity:    Worry: Not on file    Inability: Not on file  . Transportation needs:    Medical: Not on file    Non-medical: Not on file  Tobacco Use  . Smoking status: Current Every Day Smoker    Packs/day: 1.00    Types: Cigarettes  . Smokeless  tobacco: Never Used  Substance and Sexual Activity  . Alcohol use: Yes    Comment: case of beer a day  . Drug use: Yes    Types: Cocaine, Marijuana  . Sexual activity: Yes    Birth control/protection: None  Lifestyle  . Physical activity:    Days per week: Not on file    Minutes per session: Not on file  . Stress: Not on file  Relationships  . Social connections:    Talks on phone: Not on file    Gets together: Not on file    Attends religious service: Not on file    Active member of club or organization: Not on file    Attends meetings of clubs or organizations: Not on file    Relationship status: Not on file  Other Topics Concern  . Not on file  Social History Narrative  . Not on file   Additional Social History:    Allergies:   Allergies  Allergen Reactions  . Other Other (See Comments)    benzoyl peroxide - used topically for acne cause facial and tongue swelling    Labs:  Results for orders placed or  performed during the hospital encounter of 02/27/18 (from the past 48 hour(s))  Ethanol     Status: Abnormal   Collection Time: 02/27/18 11:23 PM  Result Value Ref Range   Alcohol, Ethyl (B) 13 (H) <10 mg/dL    Comment: (NOTE) Lowest detectable limit for serum alcohol is 10 mg/dL. For medical purposes only. Performed at [facility], [place] [address], [place], [place] [address]   Comprehensive metabolic panel     Status: Abnormal   Collection Time: 02/27/18 11:23 PM  Result Value Ref Range   Sodium 142 135 - 145 mmol/L   Potassium 4.0 3.5 - 5.1 mmol/L   Chloride 108 98 - 111 mmol/L    Comment: Please note change in reference range.   CO2 26 22 - 32 mmol/L   Glucose, Bld 113 (H) 70 - 99 mg/dL    Comment: Please note change in reference range.   BUN 13 6 - 20 mg/dL    Comment: Please note change in reference range.   Creatinine, Ser 0.88 0.44 - 1.00 mg/dL   Calcium 9.0 8.9 - 10.3 mg/dL   Total Protein 6.9 6.5 - 8.1 g/dL   Albumin 3.7 3.5  - 5.0 g/dL   AST 31 15 - 41 U/L   ALT 27 0 - 44 U/L    Comment: Please note change in reference range.   Alkaline Phosphatase 43 38 - 126 U/L   Total Bilirubin 0.3 0.3 - 1.2 mg/dL   GFR calc non Af Amer >60 >60 mL/min   GFR calc Af Amer >60 >60 mL/min    Comment: (NOTE) The eGFR has been calculated using the CKD EPI equation. This calculation has not been validated in all clinical situations. eGFR's persistently <60 mL/min signify possible Chronic Kidney Disease.    Anion gap 8 5 - 15    Comment: Performed at [facility], [place] [address], [place], [place] [address]  Salicylate level     Status: None   Collection Time: 02/27/18 11:23 PM  Result Value Ref Range   Salicylate Lvl <4.7 2.8 - 30.0 mg/dL    Comment: Performed at [facility], [place] [address], [place], [place] [address]  Acetaminophen level     Status: Abnormal   Collection Time: 02/27/18 11:23 PM  Result Value Ref Range   Acetaminophen (Tylenol), Serum <10 (L) 10 - 30 ug/mL    Comment: (NOTE) Therapeutic concentrations vary significantly. A range of 10-30 ug/mL  may be an effective concentration for many patients. However, some  are best treated at concentrations outside of this range. Acetaminophen concentrations >150 ug/mL at 4 hours after ingestion  and >50 ug/mL at 12 hours after ingestion are often associated with  toxic reactions. Performed at [facility], [place] [address], [place], [place] [address]   Blood gas, venous     Status: Abnormal   Collection Time: 02/27/18 11:30 PM  Result Value Ref Range   O2 Content 4.0 L/min   Delivery systems NASAL CANNULA    pH, Ven 7.322 7.250 - 7.430   pCO2, Ven 49.0 44.0 - 60.0 mmHg   pO2, Ven 81.8 (H) 32.0 - 45.0 mmHg   Bicarbonate 24.6 20.0 - 28.0 mmol/L   Acid-base deficit 1.1 0.0 - 2.0 mmol/L   O2 Saturation 92.9 %   Patient temperature 98.6    Collection site VEIN    Drawn by COLLECTED BY NURSE    Sample  type VENOUS  Comment: Performed at [facility], [place] [address] [place], [place] [address]  Urinalysis, Routine w reflex microscopic     Status: None   Collection Time: 02/27/18 11:55 PM  Result Value Ref Range   Color, Urine YELLOW YELLOW   APPearance CLEAR CLEAR   Specific Gravity, Urine 1.011 1.005 - 1.030   pH 5.0 5.0 - 8.0   Glucose, UA NEGATIVE NEGATIVE mg/dL   Hgb urine dipstick NEGATIVE NEGATIVE   Bilirubin Urine NEGATIVE NEGATIVE   Ketones, ur NEGATIVE NEGATIVE mg/dL   Protein, ur NEGATIVE NEGATIVE mg/dL   Nitrite NEGATIVE NEGATIVE   Leukocytes, UA NEGATIVE NEGATIVE    Comment: Performed at [place] [address], [place], [place] [address]  Rapid urine drug screen (hospital performed)     Status: Abnormal   Collection Time: 02/27/18 11:55 PM  Result Value Ref Range   Opiates POSITIVE (A) NONE DETECTED   Cocaine POSITIVE (A) NONE DETECTED   Benzodiazepines POSITIVE (A) NONE DETECTED   Amphetamines NONE DETECTED NONE DETECTED   Tetrahydrocannabinol NONE DETECTED NONE DETECTED   Barbiturates (A) NONE DETECTED    Result not available. Reagent lot number recalled by manufacturer.    Comment: Performed at [facility], [place] [address] [place], [place] [address]  Comprehensive metabolic panel     Status: Abnormal   Collection Time: 02/28/18  5:15 AM  Result Value Ref Range   Sodium 140 135 - 145 mmol/L   Potassium 4.6 3.5 - 5.1 mmol/L   Chloride 105 98 - 111 mmol/L    Comment: Please note change in reference range.   CO2 26 22 - 32 mmol/L   Glucose, Bld 106 (H) 70 - 99 mg/dL    Comment: Please note change in reference range.   BUN 15 6 - 20 mg/dL    Comment: Please note change in reference range.   Creatinine, Ser 0.79 0.44 - 1.00 mg/dL   Calcium 8.9 8.9 - 10.3 mg/dL   Total Protein 6.7 6.5 - 8.1 g/dL   Albumin 3.6 3.5 - 5.0 g/dL   AST 32 15 - 41 U/L   ALT 29 0 - 44 U/L    Comment: Please note change  in reference range.   Alkaline Phosphatase 43 38 - 126 U/L   Total Bilirubin 0.2 (L) 0.3 - 1.2 mg/dL   GFR calc non Af Amer >60 >60 mL/min   GFR calc Af Amer >60 >60 mL/min    Comment: (NOTE) The eGFR has been calculated using the CKD EPI equation. This calculation has not been validated in all clinical situations. eGFR's persistently <60 mL/min signify possible Chronic Kidney Disease.    Anion gap 9 5 - 15    Comment: Performed at [facility], [place] [address], [place], [place] [address]  CBC     Status: Abnormal   Collection Time: 02/28/18  5:15 AM  Result Value Ref Range   WBC 13.3 (H) 4.0 - 10.5 K/uL   RBC 4.11 3.87 - 5.11 MIL/uL   Hemoglobin 10.5 (L) 12.0 - 15.0 g/dL   HCT 34.1 (L) 36.0 - 46.0 %   MCV 83.0 78.0 - 100.0 fL   MCH 25.5 (L) 26.0 - 34.0 pg   MCHC 30.8 30.0 - 36.0 g/dL   RDW 16.4 (H) 11.5 - 15.5 %   Platelets 247 150 - 400 K/uL    Comment: Performed at [facility], [place] [address], [place], [place] [address]  Current Facility-Administered Medications  Medication Dose Route Frequency Provider Last Rate Last Dose  . acetaminophen (TYLENOL) tablet 650 mg  650 mg Oral Q6H PRN Patrecia Pour, Christean Grief, MD   650 mg at 02/28/18 1649  . amoxicillin-clavulanate (AUGMENTIN) 875-125 MG per tablet 1 tablet  1 tablet Oral Q12H Patrecia Pour, Christean Grief, MD   1 tablet at 03/01/18 0901  . ARIPiprazole (ABILIFY) tablet 5 mg  5 mg Oral Daily Patrecia Pour, Christean Grief, MD   5 mg at 03/01/18 0901  . chlordiazePOXIDE (LIBRIUM) capsule 10 mg  10 mg Oral TID Patrecia Pour, Christean Grief, MD   10 mg at 03/01/18 1543   Followed by  . [START ON 03/02/2018] chlordiazePOXIDE (LIBRIUM) capsule 5 mg  5 mg Oral TID Doreatha Lew, MD       Followed by  . [START ON 03/03/2018] chlordiazePOXIDE (LIBRIUM) capsule 5 mg  5 mg Oral BID Doreatha Lew, MD       Followed by  . [START ON 03/04/2018] chlordiazePOXIDE (LIBRIUM) capsule 5 mg  5 mg Oral Daily Patrecia Pour, Christean Grief, MD       . enoxaparin (LOVENOX) injection 55 mg  55 mg Subcutaneous Daily Jani Gravel, MD      . gabapentin (NEURONTIN) capsule 300 mg  300 mg Oral QID Patrecia Pour, Christean Grief, MD   300 mg at 03/01/18 1358  . ibuprofen (ADVIL,MOTRIN) tablet 600 mg  600 mg Oral Q6H PRN Patrecia Pour, Christean Grief, MD   600 mg at 03/01/18 1357  . levothyroxine (SYNTHROID, LEVOTHROID) tablet 25 mcg  25 mcg Oral QAC breakfast Jani Gravel, MD   25 mcg at 03/01/18 0901  . nicotine (NICODERM CQ - dosed in mg/24 hours) patch 21 mg  21 mg Transdermal Daily Patrecia Pour, Christean Grief, MD   21 mg at 03/01/18 0901  . ondansetron (ZOFRAN) injection 4 mg  4 mg Intravenous Q6H PRN Jani Gravel, MD   4 mg at 02/28/18 0516  . sertraline (ZOLOFT) tablet 50 mg  50 mg Oral Daily Jani Gravel, MD   50 mg at 03/01/18 0901  . traZODone (DESYREL) tablet 100 mg  100 mg Oral QHS PRN Jani Gravel, MD   100 mg at 02/28/18 2141    Musculoskeletal: Strength & Muscle Tone: within normal limits Gait & Station: normal Patient leans: N/A  Psychiatric Specialty Exam: Physical Exam  Vitals reviewed. Constitutional: She appears well-developed.  Cardiovascular: Normal rate.  Neurological: She is alert.  Psychiatric: She has a normal mood and affect. Her behavior is normal.    Review of Systems  Eyes:       Left eye was discolored, blackened/ blue, reports discoloration is related to resuscitation efforts.    Psychiatric/Behavioral: Positive for depression, substance abuse and suicidal ideas. The patient is nervous/anxious.   All other systems reviewed and are negative.   Blood pressure (!) 141/80, pulse 89, temperature 98.7 F (37.1 C), temperature source Oral, resp. rate (!) 22, height '5\' 1"'  (1.549 m), weight 121.2 kg (267 lb 1.6 oz), SpO2 97 %.Body mass index is 50.47 kg/m.  General Appearance: Casual  Eye Contact:  Good  Speech:  Clear and Coherent  Volume:  Normal  Mood:  Anxious and Depressed  Affect:  Depressed, Flat and Labile  Thought Process:  Coherent   Orientation:  Full (Time, Place, and Person)  Thought Content:  Hallucinations: None and Rumination  Suicidal Thoughts:  Yes.  with intent/plan  Homicidal Thoughts:  No  Memory:  Immediate;   Fair Recent;  Fair Remote;   Fair  Judgement:  Fair  Insight:  Lacking  Psychomotor Activity:  Restlessness  Concentration:  Concentration: Fair  Recall:  [employer] of Knowledge:  Fair  Language:  Fair  Akathisia:  No  Handed:  Right  AIMS (if indicated):     Assets:  Communication Skills Desire for Improvement Resilience Social Support Transportation  ADL's:  Intact  Cognition:  WNL  Sleep:        Treatment Plan Summary: Daily contact with patient to assess and evaluate symptoms and progress in treatment and Medication management   Mood stabilization  Continue with Abilify 5 mg po daily    Continue with Neurontin 300 mg PO 4 times daily   Insomnia Continue with Trazodone 100 mg for Started on CWIA/ Librium Protocol  Will continue to monitor vitals ,medication compliance and treatment side effects while patient is here.  Reviewed labs:,BAL - , UDS - positive opiates, cocaine and benzodiazepines   CSW will continue  working on disposition.  Of note patient has a follow-up at [facility] for residential treatment at 03/03/2018 (consider additional sample to keep this current appt)   Disposition: Recommend psychiatric Inpatient admission when medically cleared.  Derrill Center, NP 03/01/2018 5:00 PM

## 2018-03-01 NOTE — Care Management Note (Signed)
Case Management Note  Patient Details  Name: Shirley Murphy MRN: [id number] Date of Birth: 1986-07-04  Subjective/Objective:  Pt admitted with Drug Overdose with, Opiates, Cocaine, Benzos and ETOH.                  Action/Plan: Spoke with pt this am concerning medications. Pt request a list of medications. Explained to pt that there are no free programs for medications.  Unable to assist pt at this present time.   Expected Discharge Date:                  Expected Discharge Plan:  Psychiatric Hospital  In-House Referral:  Clinical Social Work  Discharge planning Services  CM Consult  Post Acute Care Choice:    Choice offered to:     DME Arranged:    DME Agency:     HH Arranged:    HH Agency:     Status of Service:  Completed, signed off  If discussed at [employer] Meetings, dates discussed:    Additional CommentsGeni Bers:  Berle Fitz, RN 03/01/2018, 3:36 PM

## 2018-03-01 NOTE — Progress Notes (Signed)
PROGRESS NOTE [facility]   Rolland ColonyMinnie Murphy   ZOX:[id number] DOB: [birth year]  DOA: 02/27/2018 PCP: Patient, No Pcp Per   Brief Narrative:  Shirley Murphy is a 32 year old female with history of hypertension, hep C and polysubstance abuse presented to the emergency department after having a drug overdose with heroine.  Patient was requesting admission as she was trying to kill herself with heroine.  UDS positive for opiates, cocaine and benzos.  EtOH 13 with normal LFTs.  Patient was given Narcan and admitted for further observation.  Subjective: Patient seen and examined, she feels very depressed, continues to have suicidal ideas. Multiple stressors at home, awaiting psych evaluation. Feel that want to drink alcohol. C/o ear pain.   Assessment & Plan: Heroin overdose with intent of suicide Treated with Narcan  Suicidal attempt Continue sitter 1:1 Psych has been consulted awaiting evaluation patient may need inpatient psych hospitalization She has been IVC   Depression with suicidal idea Continue Zoloft, trazodone and Abilify, defer to psychiatry for further management.  Alcohol abuse No signs of alcohol withdrawal at this time however patient feels unease wants to drink. Will start Librium taper  Hypothyroidism Continue Synthroid  Otitis media Start Augmentin, ibuprofen for pain.  DVT prophylaxis: Lovenox Code Status: Full code Family Communication: None at bedside Disposition Plan: Inpatient psych, patient medically cleared  Consultants:   Psych   Procedures:   None   Antimicrobials:  Augmentin    Objective: Vitals:   02/28/18 1435 02/28/18 2213 03/01/18 0623 03/01/18 0625  BP: 122/69 135/66  (!) 142/72  Pulse: 85 77  82  Resp: 18 18  20   Temp: 97.7 F (36.5 C) 98.4 F (36.9 C)  97.9 F (36.6 C)  TempSrc: Oral Oral  Oral  SpO2: 97% 99%  98%  Weight:   121.2 kg (267 lb 1.6 oz)   Height:        Intake/Output Summary (Last 24 hours) at  03/01/2018 0859 Last data filed at 03/01/2018 0600 Gross per 24 hour  Intake 2462.5 ml  Output 600 ml  Net 1862.5 ml   Filed Weights   02/28/18 0044 03/01/18 0623  Weight: 111.1 kg (245 lb) 121.2 kg (267 lb 1.6 oz)    Examination:  General exam: Appears calm and comfortable  HEENT: PERRLA, OP moist and clear, tympanic membranes bilaterally erythematous and bulging. Respiratory system: Clear to auscultation. No wheezes,crackle or rhonchi Cardiovascular system: S1 & S2 heard, RRR. No JVD, murmurs, rubs or gallops Gastrointestinal system: Abdomen is nondistended, soft and nontender.  Central nervous system: Alert and oriented. No focal neurological deficits. Extremities: No pedal edema.  Skin: Bruise on lateral left orbit Psychiatry: Mood & affect depressed and anxious   Data Reviewed: I have personally reviewed following labs and imaging studies  CBC: Recent Labs  Lab 02/23/18 1314 02/28/18 0515  WBC 8.1 13.3*  HGB 10.6* 10.5*  HCT 34.4* 34.1*  MCV 82.7 83.0  PLT 302 247   Basic Metabolic Panel: Recent Labs  Lab 02/23/18 1314 02/27/18 2323 02/28/18 0515  NA 138 142 140  K 3.4* 4.0 4.6  CL 106 108 105  CO2 22 26 26   GLUCOSE 102* 113* 106*  BUN 10 13 15   CREATININE 0.79 0.88 0.79  CALCIUM 9.1 9.0 8.9  MG 1.9  --   --    GFR: Estimated Creatinine Clearance: 125.3 mL/min (by C-G formula based on SCr of 0.79 mg/dL). Liver Function Tests: Recent Labs  Lab 02/23/18 1314 02/27/18 2323 02/28/18 0515  AST 22 31 32  ALT 21 27 29   ALKPHOS 58 43 43  BILITOT 0.3 0.3 0.2*  PROT 6.8 6.9 6.7  ALBUMIN 3.7 3.7 3.6   No results for input(s): LIPASE, AMYLASE in the last 168 hours. No results for input(s): AMMONIA in the last 168 hours. Coagulation Profile: No results for input(s): INR, PROTIME in the last 168 hours. Cardiac Enzymes: No results for input(s): CKTOTAL, CKMB, CKMBINDEX, TROPONINI in the last 168 hours. BNP (last 3 results) No results for input(s):  PROBNP in the last 8760 hours. HbA1C: No results for input(s): HGBA1C in the last 72 hours. CBG: Recent Labs  Lab 02/23/18 1628  GLUCAP 88   Lipid Profile: No results for input(s): CHOL, HDL, LDLCALC, TRIG, CHOLHDL, LDLDIRECT in the last 72 hours. Thyroid Function Tests: No results for input(s): TSH, T4TOTAL, FREET4, T3FREE, THYROIDAB in the last 72 hours. Anemia Panel: No results for input(s): VITAMINB12, FOLATE, FERRITIN, TIBC, IRON, RETICCTPCT in the last 72 hours. Sepsis Labs: No results for input(s): PROCALCITON, LATICACIDVEN in the last 168 hours.  No results found for this or any previous visit (from the past 240 hour(s)).    Radiology Studies: No results found.    Scheduled Meds: . amoxicillin-clavulanate  1 tablet Oral Q12H  . ARIPiprazole  5 mg Oral Daily  . chlordiazePOXIDE  10 mg Oral TID   Followed by  . [START ON 03/02/2018] chlordiazePOXIDE  5 mg Oral TID   Followed by  . [START ON 03/03/2018] chlordiazePOXIDE  5 mg Oral BID   Followed by  . [START ON 03/04/2018] chlordiazePOXIDE  5 mg Oral Daily  . enoxaparin (LOVENOX) injection  55 mg Subcutaneous Daily  . gabapentin  300 mg Oral QID  . levothyroxine  25 mcg Oral QAC breakfast  . nicotine  21 mg Transdermal Daily  . sertraline  50 mg Oral Daily   Continuous Infusions:   LOS: 0 days    Time spent: Total of 25 minutes spent with pt, greater than 50% of which was spent in discussion of  treatment, counseling and coordination of care   Latrelle Dodrill, MD Pager: Text Page via [email or web address]   If 7PM-7AM, please contact night-coverage [email or web address] 03/01/2018, 8:59 AM   Note - This record has been created using [employer]. Chart creation errors have been sought, but may not always have been located. Such creation errors do not reflect on the standard of medical care.

## 2018-03-02 DIAGNOSIS — T50902D Poisoning by unspecified drugs, medicaments and biological substances, intentional self-harm, subsequent encounter: Secondary | ICD-10-CM

## 2018-03-02 DIAGNOSIS — H6690 Otitis media, unspecified, unspecified ear: Secondary | ICD-10-CM

## 2018-03-02 MED ORDER — DIPHENHYDRAMINE HCL 25 MG PO CAPS
25.0000 mg | ORAL_CAPSULE | ORAL | Status: DC | PRN
  Administered 2018-03-02 – 2018-03-04 (×6): 25 mg via ORAL
  Filled 2018-03-02 (×6): qty 1

## 2018-03-02 MED ORDER — HYDROCORTISONE 1 % EX CREA
1.0000 "application " | TOPICAL_CREAM | Freq: Three times a day (TID) | CUTANEOUS | Status: DC | PRN
  Administered 2018-03-02 (×2): 1 via TOPICAL
  Filled 2018-03-02 (×2): qty 28

## 2018-03-02 MED ORDER — CEFDINIR 300 MG PO CAPS
300.0000 mg | ORAL_CAPSULE | Freq: Two times a day (BID) | ORAL | Status: DC
  Administered 2018-03-02 – 2018-03-04 (×5): 300 mg via ORAL
  Filled 2018-03-02 (×5): qty 1

## 2018-03-02 MED ORDER — LISINOPRIL 10 MG PO TABS
10.0000 mg | ORAL_TABLET | Freq: Every day | ORAL | Status: DC
  Administered 2018-03-03 – 2018-03-04 (×2): 10 mg via ORAL
  Filled 2018-03-02 (×2): qty 1

## 2018-03-02 MED ORDER — IBUPROFEN 200 MG PO TABS
600.0000 mg | ORAL_TABLET | Freq: Four times a day (QID) | ORAL | Status: DC | PRN
  Administered 2018-03-03 – 2018-03-04 (×3): 600 mg via ORAL
  Filled 2018-03-02 (×3): qty 3

## 2018-03-02 MED ORDER — HYDRALAZINE HCL 20 MG/ML IJ SOLN
5.0000 mg | Freq: Once | INTRAMUSCULAR | Status: AC
  Administered 2018-03-02: 5 mg via INTRAVENOUS
  Filled 2018-03-02: qty 1

## 2018-03-02 NOTE — Progress Notes (Addendum)
LCSW following for inpatient psych placement.   [facility]   LCSW will continue to follow for dc needs.

## 2018-03-02 NOTE — Progress Notes (Signed)
PROGRESS NOTE [facility]   SuperiorMinnie Murphy   ZOX:[id number] DOB: [birth year]  DOA: 02/27/2018 PCP: Patient, No Pcp Per   Brief Narrative:  Shirley Murphy is a 32 year old female with history of hypertension, hep C and polysubstance abuse presented to the emergency department after having a drug overdose with heroine.  Patient was requesting admission as she was trying to kill herself with heroine.  UDS positive for opiates, cocaine and benzos.  EtOH 13 with normal LFTs.  Patient was given Narcan and admitted for further observation.  Subjective: Patient seen and examined, c/o a lower back itchy rash, erythematous towards flank area. Denies difficulty swallowing, no SOB or sore throat. Patient was started on Augmentin yesterday for OM.  Assessment & Plan: Heroin overdose with intent of suicide Treated with Narcan  Suicidal attempt Continue sitter 1:1 Patient IVC, psych recommending inpatient admission.   Depression with suicidal idea Continue management per psychiatry. Abilify, Neurontin, Zoloft and Trazodone.   Polysubstance/Alcohol abuse No signs of alcohol withdrawal at this time. Continue Librium taper  UDS + for cocaine, opiates and cocaine   Hypothyroidism Continue Synthroid Check TSH as outpatient   Otitis media Switch Augmentin for Cedinir x 5 days   Drug Allergic reaction  Likely penicillin allergy, will add benadryl PRN, no airway compromise   Patient is medically clear for psych inpatient admission.   DVT prophylaxis: Lovenox Code Status: Full code Family Communication: None at bedside Disposition Plan: Inpatient psych when bed available   Consultants:   Psych   Procedures:   None   Antimicrobials:  Augmentin    Objective: Vitals:   03/01/18 0625 03/01/18 1501 03/01/18 2047 03/02/18 0605  BP: (!) 142/72 (!) 141/80 (!) 146/92 122/81  Pulse: 82 89 83 70  Resp: 20 (!) 22 20   Temp: 97.9 F (36.6 C) 98.7 F (37.1 C) 98.7 F (37.1 C)  97.6 F (36.4 C)  TempSrc: Oral Oral Oral Oral  SpO2: 98% 97% 97% 96%  Weight:    121.9 kg (268 lb 12.8 oz)  Height:        Intake/Output Summary (Last 24 hours) at 03/02/2018 1058 Last data filed at 03/02/2018 0731 Gross per 24 hour  Intake 840 ml  Output 0 ml  Net 840 ml   Filed Weights   02/28/18 0044 03/01/18 0623 03/02/18 0605  Weight: 111.1 kg (245 lb) 121.2 kg (267 lb 1.6 oz) 121.9 kg (268 lb 12.8 oz)    Examination:  General: Pt is alert, awake, not in acute distress Cardiovascular: RRR, S1/S2 +, no rubs, no gallops Respiratory: CTA bilaterally, no wheezing, no rhonchi Abdominal: Soft, NT, ND, bowel sounds + Extremities: no edema Skin: Erythematous rash, some wheels/hives itchy on left flank    Data Reviewed: I have personally reviewed following labs and imaging studies  CBC: Recent Labs  Lab 02/23/18 1314 02/28/18 0515  WBC 8.1 13.3*  HGB 10.6* 10.5*  HCT 34.4* 34.1*  MCV 82.7 83.0  PLT 302 247   Basic Metabolic Panel: Recent Labs  Lab 02/23/18 1314 02/27/18 2323 02/28/18 0515  NA 138 142 140  K 3.4* 4.0 4.6  CL 106 108 105  CO2 22 26 26   GLUCOSE 102* 113* 106*  BUN 10 13 15   CREATININE 0.79 0.88 0.79  CALCIUM 9.1 9.0 8.9  MG 1.9  --   --    GFR: Estimated Creatinine Clearance: 125.6 mL/min (by C-G formula based on SCr of 0.79 mg/dL). Liver Function Tests: Recent Labs  Lab 02/23/18 1314  02/27/18 2323 02/28/18 0515  AST 22 31 32  ALT 21 27 29   ALKPHOS 58 43 43  BILITOT 0.3 0.3 0.2*  PROT 6.8 6.9 6.7  ALBUMIN 3.7 3.7 3.6   No results for input(s): LIPASE, AMYLASE in the last 168 hours. No results for input(s): AMMONIA in the last 168 hours. Coagulation Profile: No results for input(s): INR, PROTIME in the last 168 hours. Cardiac Enzymes: No results for input(s): CKTOTAL, CKMB, CKMBINDEX, TROPONINI in the last 168 hours. BNP (last 3 results) No results for input(s): PROBNP in the last 8760 hours. HbA1C: No results for input(s):  HGBA1C in the last 72 hours. CBG: Recent Labs  Lab 02/23/18 1628  GLUCAP 88   Lipid Profile: No results for input(s): CHOL, HDL, LDLCALC, TRIG, CHOLHDL, LDLDIRECT in the last 72 hours. Thyroid Function Tests: No results for input(s): TSH, T4TOTAL, FREET4, T3FREE, THYROIDAB in the last 72 hours. Anemia Panel: No results for input(s): VITAMINB12, FOLATE, FERRITIN, TIBC, IRON, RETICCTPCT in the last 72 hours. Sepsis Labs: No results for input(s): PROCALCITON, LATICACIDVEN in the last 168 hours.  No results found for this or any previous visit (from the past 240 hour(s)).    Radiology Studies: No results found.    Scheduled Meds: . ARIPiprazole  5 mg Oral Daily  . cefdinir  300 mg Oral Q12H  . chlordiazePOXIDE  5 mg Oral TID   Followed by  . [START ON 03/03/2018] chlordiazePOXIDE  5 mg Oral BID   Followed by  . [START ON 03/04/2018] chlordiazePOXIDE  5 mg Oral Daily  . enoxaparin (LOVENOX) injection  55 mg Subcutaneous Daily  . gabapentin  300 mg Oral QID  . levothyroxine  25 mcg Oral QAC breakfast  . nicotine  21 mg Transdermal Daily  . polyethylene glycol  17 g Oral Daily  . sertraline  50 mg Oral Daily   Continuous Infusions:   LOS: 0 days    Time spent: Total of 15 minutes spent with pt, greater than 50% of which was spent in discussion of  treatment, counseling and coordination of care   Latrelle Dodrill, MD Pager: Text Page via [email or web address]   If 7PM-7AM, please contact night-coverage [email or web address] 03/02/2018, 10:58 AM   Note - This record has been created using [employer]. Chart creation errors have been sought, but may not always have been located. Such creation errors do not reflect on the standard of medical care.

## 2018-03-02 NOTE — Clinical Social Work Note (Signed)
Clinical Social Work Assessment  Patient Details  Name: Shirley Murphy MRN: [id number] Date of Birth: 09/21/1985  Date of referral:  03/02/18               Reason for consult:  Facility Placement                Permission sought to share information with:  Other Permission granted to share information::  Yes, Verbal Permission Granted  Name::     Environmental manager::  Psych facilities  Relationship::  Significant Other  Contact Information:     Housing/Transportation Living arrangements for the past 2 months:  Apartment Source of Information:  Patient, Event organiser Patient Interpreter Needed:  None Criminal Activity/Legal Involvement Pertinent to Current Situation/Hospitalization:  No - Comment as needed Significant Relationships:  Significant Other Lives with:  Self Do you feel safe going back to the place where you live?  No Need for family participation in patient care:  Yes (Comment)  Care giving concerns:  Patient states she recently lost her housing, she is unemployed and has no insurance.    Social Worker assessment / plan:  LCSW consulted for inpatient psych placement.   LCSW met at bedside with patient. Patient has a Actuary for safety.   Patient reports that she was recently hospitalized at [facility]. She states she was not ready to dc and attempted once dc'd from [facility]. Patient listed several social stressors such as homelessness, unemployment and lack of insurance.   Patient reports that she has an appointment with [facility] on 7/19 and is concerned about not having scripts upon entering [facility]. Patient reports that she has bene hospitalized on multiple occassions.   Patient is originally from [place] and list her boyfriend as her support. She reports that he takes care of her, but shows her tough love.   Patient is voluntary at the time of assessment.   Employment status:  Unemployed Forensic scientist:  Self Pay (Medicaid Pending) PT Recommendations:     Information / Referral to community resources:     Patient/Family's Response to care:  Patient is thankful for LCSW visit and coordination with facility placement.   Patient/Family's Understanding of and Emotional Response to Diagnosis, Current Treatment, and Prognosis:  Patient is realistic about her current situation. Patient is familiar with the inpatient psych process and ask LCSW " where you sending me". Patient states she had success at [facility] in the past. LCSW explained that she would be faxed out to all facilities and updated when a bed is available.   Emotional Assessment Appearance:    Attitude/Demeanor/Rapport:    Affect (typically observed):  Accepting, Calm Orientation:  Oriented to Self, Oriented to Place, Oriented to  Time, Oriented to Situation Alcohol / Substance use:  Illicit Drugs Psych involvement (Current and /or in the community):  No (Comment)  Discharge Needs  Concerns to be addressed:  Employment/School Concerns, Homelessness, Mental Health Concerns Readmission within the last 30 days:  Yes Current discharge risk:  None Barriers to Discharge:  Psych Bed not available   Servando Snare, LCSW 03/02/2018, 3:35 PM

## 2018-03-03 DIAGNOSIS — T50904A Poisoning by unspecified drugs, medicaments and biological substances, undetermined, initial encounter: Secondary | ICD-10-CM

## 2018-03-03 LAB — CBC
HEMATOCRIT: 31.7 % — AB (ref 36.0–46.0)
HEMOGLOBIN: 9.7 g/dL — AB (ref 12.0–15.0)
MCH: 25.5 pg — AB (ref 26.0–34.0)
MCHC: 30.6 g/dL (ref 30.0–36.0)
MCV: 83.2 fL (ref 78.0–100.0)
Platelets: 263 10*3/uL (ref 150–400)
RBC: 3.81 MIL/uL — ABNORMAL LOW (ref 3.87–5.11)
RDW: 16.2 % — AB (ref 11.5–15.5)
WBC: 5.2 10*3/uL (ref 4.0–10.5)

## 2018-03-03 MED ORDER — FLUCONAZOLE 150 MG PO TABS
150.0000 mg | ORAL_TABLET | Freq: Once | ORAL | Status: AC
  Administered 2018-03-03: 150 mg via ORAL
  Filled 2018-03-03: qty 1

## 2018-03-03 NOTE — Progress Notes (Signed)
PROGRESS NOTE [facility]   [name]   ZOX:[id number] DOB: [birth year]  DOA: 02/27/2018 PCP: Patient, No Pcp Per   Brief Narrative:  Shirley Murphy is a 32 year old female with history of hypertension, hep C and polysubstance abuse presented to the emergency department after having a drug overdose with heroine.  Patient was requesting admission as she was trying to kill herself with heroine.  UDS positive for opiates, cocaine and benzos.  EtOH 13 with normal LFTs.  Patient was given Narcan and admitted for further observation.  Subjective:  Awake alert rash not much better in groin no fever no chills still a little anxious and wants to walk around  Assessment & Plan: Heroin overdose with intent of suicide Treated with Narcan  Suicidal attempt Continue sitter 1:1 Patient IVC, psych recommending inpatient admissio-psych last consulted on the patient 7/15 made recommendations we will reconsult them   [facility]  Depression with suicidal idea Continue management per psychiatry. Abilify, Neurontin, Zoloft and Trazodone.   Polysubstance/Alcohol abuse No signs of alcohol withdrawal at this time. Continue Librium taper and possibly discontinue the same 7/18 is at will be 5 days out from her last drink UDS + for cocaine, opiates and cocaine   Hypothyroidism Continue Synthroid Check TSH as outpatient to 4 weeks when stable  Hypertension- blood pressure is elevated continue lisinopril 10 daily-we will augment the same in 2 to 3 weeks as an outpatient if still elevated UDS positive for cocaine which probably contributing as well  Otitis media Switch Augmentin for Cedinir x 5 days   Drug Allergic reaction versus fungal rash Likely penicillin allergy, discontinue cortisone, giving Benadryl, give Diflucan 150 p.o. x1 and repeat in 2 weeks and can use fungal cream  Patient is medically clear for psych inpatient admission.   DVT prophylaxis:  Lovenox Code Status: Full code Family Communication: None at bedside Disposition Plan: Inpatient psych when bed available   Consultants:   Psych   Procedures:   None   Antimicrobials:  Augmentin    Objective: Vitals:   03/02/18 2007 03/02/18 2124 03/03/18 0658 03/03/18 1257  BP: (!) 164/91 (!) 152/79 (!) 162/90 (!) 162/97  Pulse: 77  79 75  Resp: 20  18 18   Temp: 97.9 F (36.6 C)  98.8 F (37.1 C) 97.6 F (36.4 C)  TempSrc: Axillary  Oral Oral  SpO2: 99%  98% 99%  Weight:      Height:        Intake/Output Summary (Last 24 hours) at 03/03/2018 1428 Last data filed at 03/02/2018 1619 Gross per 24 hour  Intake 240 ml  Output -  Net 240 ml   Filed Weights   02/28/18 0044 03/01/18 0623 03/02/18 0605  Weight: 111.1 kg (245 lb) 121.2 kg (267 lb 1.6 oz) 121.9 kg (268 lb 12.8 oz)    Examination:  General: Pt is alert, awake, not in acute distress Cardiovascular: RRR, S1/S2 +, no rubs, no gallops Respiratory: CTA bilaterally, no wheezing, no rhonchi Abdominal: Soft, NT, ND, bowel sounds + Extremities: no edema Skin: Erythematous rash, some wheels/hives itchy on left flank    Data Reviewed: I have personally reviewed following labs and imaging studies  CBC: Recent Labs  Lab 02/28/18 0515 03/03/18 0804  WBC 13.3* 5.2  HGB 10.5* 9.7*  HCT 34.1* 31.7*  MCV 83.0 83.2  PLT 247 263   Basic Metabolic Panel: Recent Labs  Lab 02/27/18 2323 02/28/18 0515  NA 142 140  K  4.0 4.6  CL 108 105  CO2 26 26  GLUCOSE 113* 106*  BUN 13 15  CREATININE 0.88 0.79  CALCIUM 9.0 8.9   GFR: Estimated Creatinine Clearance: 125.6 mL/min (by C-G formula based on SCr of 0.79 mg/dL). Liver Function Tests: Recent Labs  Lab 02/27/18 2323 02/28/18 0515  AST 31 32  ALT 27 29  ALKPHOS 43 43  BILITOT 0.3 0.2*  PROT 6.9 6.7  ALBUMIN 3.7 3.6   No results for input(s): LIPASE, AMYLASE in the last 168 hours. No results for input(s): AMMONIA in the last 168  hours. Coagulation Profile: No results for input(s): INR, PROTIME in the last 168 hours. Cardiac Enzymes: No results for input(s): CKTOTAL, CKMB, CKMBINDEX, TROPONINI in the last 168 hours. BNP (last 3 results) No results for input(s): PROBNP in the last 8760 hours. HbA1C: No results for input(s): HGBA1C in the last 72 hours. CBG: No results for input(s): GLUCAP in the last 168 hours. Lipid Profile: No results for input(s): CHOL, HDL, LDLCALC, TRIG, CHOLHDL, LDLDIRECT in the last 72 hours. Thyroid Function Tests: No results for input(s): TSH, T4TOTAL, FREET4, T3FREE, THYROIDAB in the last 72 hours. Anemia Panel: No results for input(s): VITAMINB12, FOLATE, FERRITIN, TIBC, IRON, RETICCTPCT in the last 72 hours. Sepsis Labs: No results for input(s): PROCALCITON, LATICACIDVEN in the last 168 hours.  No results found for this or any previous visit (from the past 240 hour(s)).    Radiology Studies: No results found.    Scheduled Meds: . ARIPiprazole  5 mg Oral Daily  . cefdinir  300 mg Oral Q12H  . chlordiazePOXIDE  5 mg Oral BID   Followed by  . [START ON 03/04/2018] chlordiazePOXIDE  5 mg Oral Daily  . fluconazole  150 mg Oral Once  . gabapentin  300 mg Oral QID  . levothyroxine  25 mcg Oral QAC breakfast  . lisinopril  10 mg Oral Daily  . nicotine  21 mg Transdermal Daily  . polyethylene glycol  17 g Oral Daily  . sertraline  50 mg Oral Daily   Continuous Infusions:   LOS: 1 day    Time spent: 15 minutes  [name], MD Pager: Text Page via [email or web address]   If 7PM-7AM, please contact night-coverage [email or web address] 03/03/2018, 2:28 PM

## 2018-03-03 NOTE — Progress Notes (Signed)
Requested psych re-evaluation. [facility]. Patient indicates that she is no longer SI. Patient concerned about having 30 day supply of meds for admission to [facility].   Beulah GandyBernette Ginelle Bays, LSCW [facility] [phone]

## 2018-03-03 NOTE — Progress Notes (Signed)
[facility] under [facility].  Shirley GandyBernette Kierah Goatley, LSCW [facility] [phone]

## 2018-03-04 DIAGNOSIS — F17210 Nicotine dependence, cigarettes, uncomplicated: Secondary | ICD-10-CM

## 2018-03-04 DIAGNOSIS — Z91410 Personal history of adult physical and sexual abuse: Secondary | ICD-10-CM

## 2018-03-04 DIAGNOSIS — G4700 Insomnia, unspecified: Secondary | ICD-10-CM

## 2018-03-04 DIAGNOSIS — T43202A Poisoning by unspecified antidepressants, intentional self-harm, initial encounter: Secondary | ICD-10-CM

## 2018-03-04 DIAGNOSIS — F329 Major depressive disorder, single episode, unspecified: Secondary | ICD-10-CM

## 2018-03-04 DIAGNOSIS — F1490 Cocaine use, unspecified, uncomplicated: Secondary | ICD-10-CM

## 2018-03-04 DIAGNOSIS — F1994 Other psychoactive substance use, unspecified with psychoactive substance-induced mood disorder: Secondary | ICD-10-CM

## 2018-03-04 DIAGNOSIS — F1290 Cannabis use, unspecified, uncomplicated: Secondary | ICD-10-CM

## 2018-03-04 DIAGNOSIS — Z818 Family history of other mental and behavioral disorders: Secondary | ICD-10-CM

## 2018-03-04 DIAGNOSIS — F419 Anxiety disorder, unspecified: Secondary | ICD-10-CM

## 2018-03-04 DIAGNOSIS — T1491XA Suicide attempt, initial encounter: Secondary | ICD-10-CM

## 2018-03-04 MED ORDER — TRAZODONE HCL 100 MG PO TABS: 100.0000 mg | ORAL_TABLET | Freq: Every evening | ORAL | 0 refills | Status: DC | PRN

## 2018-03-04 MED ORDER — SERTRALINE HCL 50 MG PO TABS: 50.0000 mg | ORAL_TABLET | Freq: Every day | ORAL | 0 refills | Status: DC

## 2018-03-04 MED ORDER — LEVOTHYROXINE SODIUM 25 MCG PO TABS: 25.0000 ug | ORAL_TABLET | Freq: Every day | ORAL | 0 refills | Status: DC

## 2018-03-04 MED ORDER — CEFDINIR 300 MG PO CAPS: 300.0000 mg | ORAL_CAPSULE | Freq: Two times a day (BID) | ORAL | 0 refills | Status: DC

## 2018-03-04 MED ORDER — LIP MEDEX EX OINT
TOPICAL_OINTMENT | CUTANEOUS | Status: AC
  Administered 2018-03-04: 1
  Filled 2018-03-04: qty 7

## 2018-03-04 MED ORDER — PRAZOSIN HCL 1 MG PO CAPS: 1.0000 mg | ORAL_CAPSULE | Freq: Every day | ORAL | 0 refills | Status: DC

## 2018-03-04 MED ORDER — LISINOPRIL 10 MG PO TABS: 10.0000 mg | ORAL_TABLET | Freq: Every day | ORAL | 0 refills | Status: DC

## 2018-03-04 MED ORDER — ARIPIPRAZOLE 5 MG PO TABS: 5.0000 mg | ORAL_TABLET | Freq: Every day | ORAL | 0 refills | Status: DC

## 2018-03-04 MED ORDER — NICOTINE 21 MG/24HR TD PT24: 21.0000 mg | MEDICATED_PATCH | Freq: Every day | TRANSDERMAL | 0 refills | Status: DC

## 2018-03-04 MED ORDER — GABAPENTIN 300 MG PO CAPS: 300.0000 mg | ORAL_CAPSULE | Freq: Four times a day (QID) | ORAL | 0 refills | Status: DC
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
## 2018-03-04 NOTE — Discharge Summary (Signed)
Physician Discharge Summary  Shirley Murphy XLK:[id number] DOB: 12-May-1986 DOA: 02/27/2018  PCP: Patient, No Pcp Per  Admit date: 02/27/2018 Discharge date: 03/04/2018  Time spent: 30 minutes  Recommendations for Outpatient Follow-up:  1. [facility] and scripts given for 1 week supply of meds  Discharge Diagnoses:  Principal Problem:   Overdose Active Problems:   Suicidal ideation   Drug overdose   Discharge Condition: improved  Diet recommendation: hh low salt  Filed Weights   03/01/18 0623 03/02/18 0605 03/04/18 0651  Weight: 121.2 kg (267 lb 1.6 oz) 121.9 kg (268 lb 12.8 oz) 120.2 kg (264 lb 14.4 oz)    History of present illness:  32 year old Caucasian female hep C polysubstance abuse hypertension drug overdose with heroin had suicidal ideation UDS positive for cocaine opiates benzos patient given Narcan  Hospital Course:  Suicidal ideation-patient had one-to-one sitter patient was IVC and was seen again on 7/18 and cleared as contracted for safety 1 week supply of all medications were given  Heroin overdose-stable at this time  Polysubstance and alcohol abuse no withdrawal-needs outpatient management [facility] and will be going there  Hypothyroidism continue Synthroid-outpatient TSH needed  Hypertension given small amount of lisinopril on discharge will need outpatient management  Drug allergy versus fungal rash-given Diflucan X1 can use over-the-counter fungal powder to complete outpatient management  Otitis media finish cefdinir on 7/20    Discharge Exam: Vitals:   03/04/18 0350 03/04/18 1323  BP: 120/70 135/78  Pulse: 69 79  Resp: 18   Temp: 97.7 F (36.5 C) 98.1 F (36.7 C)  SpO2: 96% 97%    General: Alert pleasant no distress Cardiovascular: S1-S2 no murmur Respiratory: Clinically clear no added sound Abdomen soft nontender no rebound no guarding Neurologically intact Psychiatric stable  Discharge  Instructions   Discharge Instructions    Diet - low sodium heart healthy   Complete by:  As directed    Discharge instructions   Complete by:  As directed    Please finish omnicef for ear infection You will be given 1 week of meds and then you will have to follow up further with [facility] Social work will get you a list of mental health providers and MD's that you can visit   Increase activity slowly   Complete by:  As directed      Allergies as of 03/04/2018      Reactions   Other Other (See Comments)   benzoyl peroxide - used topically for acne cause facial and tongue swelling      Medication List    STOP taking these medications   acamprosate 333 MG tablet Commonly known as:  CAMPRAL   hydrOXYzine 25 MG tablet Commonly known as:  ATARAX/VISTARIL   ibuprofen 600 MG tablet Commonly known as:  ADVIL,MOTRIN   MULTIVITAMIN ADULT PO   VITAMIN B1 PO     TAKE these medications   ARIPiprazole 5 MG tablet Commonly known as:  ABILIFY Take 1 tablet (5 mg total) by mouth daily.   cefdinir 300 MG capsule Commonly known as:  OMNICEF Take 1 capsule (300 mg total) by mouth every 12 (twelve) hours.   gabapentin 300 MG capsule Commonly known as:  NEURONTIN Take 1 capsule (300 mg total) by mouth 4 (four) times daily. For withdrawal symptoms   levothyroxine 25 MCG tablet Commonly known as:  SYNTHROID, LEVOTHROID Take 1 tablet (25 mcg total) by mouth daily before breakfast.   lisinopril 10 MG  tablet Commonly known as:  PRINIVIL,ZESTRIL Take 1 tablet (10 mg total) by mouth daily.   nicotine 21 mg/24hr patch Commonly known as:  NICODERM CQ - dosed in mg/24 hours Place 1 patch (21 mg total) onto the skin daily. Start taking on:  03/05/2018   prazosin 1 MG capsule Commonly known as:  MINIPRESS Take 1 capsule (1 mg total) by mouth at bedtime.   sertraline 50 MG tablet Commonly known as:  ZOLOFT Take 1 tablet (50 mg total) by mouth daily.   traZODone 100 MG tablet Commonly  known as:  DESYREL Take 1 tablet (100 mg total) by mouth at bedtime as needed for sleep. What changed:    medication strength  how much to take      Allergies  Allergen Reactions  . Other Other (See Comments)    benzoyl peroxide - used topically for acne cause facial and tongue swelling   Follow-up Information     [facility] Follow up.   Why:  Please call on Monday March 08, 2018 for a hospital follow up appointment. Contact information: [address] [place] [address] [phone]           The results of significant diagnostics from this hospitalization (including imaging, microbiology, ancillary and laboratory) are listed below for reference.    Significant Diagnostic Studies: No results found.  Microbiology: No results found for this or any previous visit (from the past 240 hour(s)).   Labs: Basic Metabolic Panel: Recent Labs  Lab 02/27/18 2323 02/28/18 0515  NA 142 140  K 4.0 4.6  CL 108 105  CO2 26 26  GLUCOSE 113* 106*  BUN 13 15  CREATININE 0.88 0.79  CALCIUM 9.0 8.9   Liver Function Tests: Recent Labs  Lab 02/27/18 2323 02/28/18 0515  AST 31 32  ALT 27 29  ALKPHOS 43 43  BILITOT 0.3 0.2*  PROT 6.9 6.7  ALBUMIN 3.7 3.6   No results for input(s): LIPASE, AMYLASE in the last 168 hours. No results for input(s): AMMONIA in the last 168 hours. CBC: Recent Labs  Lab 02/28/18 0515 03/03/18 0804  WBC 13.3* 5.2  HGB 10.5* 9.7*  HCT 34.1* 31.7*  MCV 83.0 83.2  PLT 247 263   Cardiac Enzymes: No results for input(s): CKTOTAL, CKMB, CKMBINDEX, TROPONINI in the last 168 hours. BNP: BNP (last 3 results) No results for input(s): BNP in the last 8760 hours.  ProBNP (last 3 results) No results for input(s): PROBNP in the last 8760 hours.  CBG: No results for input(s): GLUCAP in the last 168 hours.     Signed:  Rhetta Mura MD   [facility] 03/04/2018, 3:40 PM

## 2018-08-12 ENCOUNTER — Observation Stay (HOSPITAL_COMMUNITY)
Admission: EM | Admit: 2018-08-12 | Discharge: 2018-08-15 | Disposition: A | Discharge status: Home / Self Care | Payer: Self-pay | Attending: Internal Medicine | Admitting: Internal Medicine

## 2018-08-12 ENCOUNTER — Encounter (HOSPITAL_COMMUNITY): Payer: Self-pay

## 2018-08-12 ENCOUNTER — Other Ambulatory Visit: Payer: Self-pay

## 2018-08-12 ENCOUNTER — Emergency Department (HOSPITAL_COMMUNITY): Payer: Self-pay

## 2018-08-12 DIAGNOSIS — I10 Essential (primary) hypertension: Secondary | ICD-10-CM | POA: Insufficient documentation

## 2018-08-12 DIAGNOSIS — R7989 Other specified abnormal findings of blood chemistry: Secondary | ICD-10-CM | POA: Insufficient documentation

## 2018-08-12 DIAGNOSIS — F1410 Cocaine abuse, uncomplicated: Secondary | ICD-10-CM | POA: Insufficient documentation

## 2018-08-12 DIAGNOSIS — F332 Major depressive disorder, recurrent severe without psychotic features: Secondary | ICD-10-CM

## 2018-08-12 DIAGNOSIS — B182 Chronic viral hepatitis C: Secondary | ICD-10-CM | POA: Insufficient documentation

## 2018-08-12 DIAGNOSIS — F1110 Opioid abuse, uncomplicated: Secondary | ICD-10-CM | POA: Insufficient documentation

## 2018-08-12 DIAGNOSIS — Z6841 Body Mass Index (BMI) 40.0 and over, adult: Secondary | ICD-10-CM | POA: Insufficient documentation

## 2018-08-12 DIAGNOSIS — F329 Major depressive disorder, single episode, unspecified: Secondary | ICD-10-CM | POA: Insufficient documentation

## 2018-08-12 DIAGNOSIS — R112 Nausea with vomiting, unspecified: Secondary | ICD-10-CM | POA: Diagnosis present

## 2018-08-12 DIAGNOSIS — F1910 Other psychoactive substance abuse, uncomplicated: Secondary | ICD-10-CM | POA: Insufficient documentation

## 2018-08-12 DIAGNOSIS — F17210 Nicotine dependence, cigarettes, uncomplicated: Secondary | ICD-10-CM | POA: Insufficient documentation

## 2018-08-12 DIAGNOSIS — K759 Inflammatory liver disease, unspecified: Secondary | ICD-10-CM

## 2018-08-12 DIAGNOSIS — Z8249 Family history of ischemic heart disease and other diseases of the circulatory system: Secondary | ICD-10-CM | POA: Insufficient documentation

## 2018-08-12 DIAGNOSIS — B179 Acute viral hepatitis, unspecified: Principal | ICD-10-CM | POA: Diagnosis present

## 2018-08-12 HISTORY — DX: Calculus of gallbladder without cholecystitis without obstruction: K80.20

## 2018-08-12 LAB — COMPREHENSIVE METABOLIC PANEL
ALBUMIN: 3.3 g/dL — AB (ref 3.5–5.0)
ALT: 852 U/L — AB (ref 0–44)
AST: 589 U/L — AB (ref 15–41)
Alkaline Phosphatase: 85 U/L (ref 38–126)
Anion gap: 7 (ref 5–15)
BILIRUBIN TOTAL: 0.5 mg/dL (ref 0.3–1.2)
BUN: 13 mg/dL (ref 6–20)
CO2: 23 mmol/L (ref 22–32)
CREATININE: 0.85 mg/dL (ref 0.44–1.00)
Calcium: 8.6 mg/dL — ABNORMAL LOW (ref 8.9–10.3)
Chloride: 108 mmol/L (ref 98–111)
GFR calc Af Amer: >60 mL/min (ref >60)
GLUCOSE: 115 mg/dL — AB (ref 70–99)
POTASSIUM: 4.4 mmol/L (ref 3.5–5.1)
Sodium: 138 mmol/L (ref 135–145)
TOTAL PROTEIN: 6.9 g/dL (ref 6.5–8.1)

## 2018-08-12 LAB — URINALYSIS, ROUTINE W REFLEX MICROSCOPIC
BILIRUBIN URINE: NEGATIVE
Glucose, UA: NEGATIVE mg/dL
HGB URINE DIPSTICK: NEGATIVE
KETONES UR: NEGATIVE mg/dL
Leukocytes, UA: NEGATIVE
NITRITE: NEGATIVE
PH: 6 (ref 5.0–8.0)
Protein, ur: NEGATIVE mg/dL
SPECIFIC GRAVITY, URINE: 1.018 (ref 1.005–1.030)

## 2018-08-12 LAB — APTT: APTT: 27 s (ref 24–36)

## 2018-08-12 LAB — PROTIME-INR
INR: 0.98
Prothrombin Time: 12.9 seconds (ref 11.4–15.2)

## 2018-08-12 LAB — CBC
HEMATOCRIT: 35.2 % — AB (ref 36.0–46.0)
HEMOGLOBIN: 10.2 g/dL — AB (ref 12.0–15.0)
MCH: 23.1 pg — AB (ref 26.0–34.0)
MCHC: 29 g/dL — ABNORMAL LOW (ref 30.0–36.0)
MCV: 79.6 fL — ABNORMAL LOW (ref 80.0–100.0)
Platelets: 290 10*3/uL (ref 150–400)
RBC: 4.42 MIL/uL (ref 3.87–5.11)
RDW: 15.6 % — AB (ref 11.5–15.5)
WBC: 5.1 10*3/uL (ref 4.0–10.5)
nRBC: 0 % (ref 0.0–0.2)

## 2018-08-12 LAB — I-STAT BETA HCG BLOOD, ED (MC, WL, AP ONLY): I-stat hCG, quantitative: <5 m[IU]/mL (ref <5)

## 2018-08-12 LAB — LIPASE, BLOOD: Lipase: 34 U/L (ref 11–51)

## 2018-08-12 LAB — ACETAMINOPHEN LEVEL

## 2018-08-12 LAB — AMMONIA: AMMONIA: 34 umol/L (ref 9–35)

## 2018-08-12 LAB — TROPONIN I

## 2018-08-12 MED ORDER — PROMETHAZINE HCL 25 MG/ML IJ SOLN
25.0000 mg | Freq: Once | INTRAMUSCULAR | Status: AC
  Administered 2018-08-12: 25 mg via INTRAVENOUS
  Filled 2018-08-12: qty 1

## 2018-08-12 MED ORDER — NICOTINE 21 MG/24HR TD PT24
21.0000 mg | MEDICATED_PATCH | Freq: Once | TRANSDERMAL | Status: AC
  Administered 2018-08-12: 21 mg via TRANSDERMAL
  Filled 2018-08-12: qty 1

## 2018-08-12 MED ORDER — SODIUM CHLORIDE 0.9 % IV BOLUS
1000.0000 mL | Freq: Once | INTRAVENOUS | Status: AC
  Administered 2018-08-12: 1000 mL via INTRAVENOUS

## 2018-08-12 NOTE — ED Triage Notes (Signed)
Patient c/o vomiting and abdominal pain. Patient reports a history of gallstones and Hepatitis C. Patient states she is also having itching and urine is very dark in color.

## 2018-08-12 NOTE — ED Provider Notes (Addendum)
[facility] DEPT Provider Note   CSN: [id number] Arrival date & time: 08/12/18  1820     History   Chief Complaint Chief Complaint  Patient presents with  . Emesis  . Abdominal Pain    HPI Shirley Murphy is a 32 y.o. female with past medical history of chronic hepatitis C, polysubstance abuse, major depressive disorder, alcohol abuse, suicide attempt, who presents today for evaluation of emesis and abdominal pain.  She reports that for approximately 1 week she has had generalized severe abdominal pain when she eats with nausea vomiting and a few episodes of diarrhea.  She tells me that she has been clean for a few months other than recently relapsing on cocaine.    Chart review shows that normally her LFTs are not elevated.  She reports that she has been feeling itchy, weak and generally unwell like she normally does when her liver "has issues."  She denies any recent alcohol use.    HPI  Past Medical History:  Diagnosis Date  . Chest pain   . Gallstones   . Hepatitis C   . Hypertension   . Irregular heart beat     Patient Active Problem List   Diagnosis Date Noted  . Overdose 02/28/2018  . Drug overdose 02/28/2018  . Severe recurrent major depression without psychotic features ([place]) 02/24/2018  . Chronic hepatitis C without hepatic coma ([place]) 02/23/2018  . Alcohol dependence with alcohol-induced mood disorder ([place]) 02/02/2018  . MDD (major depressive disorder), recurrent severe, without psychosis ([place]) 02/01/2018  . Moderate cocaine use disorder ([place]) 06/12/2017  . Alcohol abuse with alcohol-induced mood disorder ([place]) 06/12/2017  . Substance induced mood disorder ([place]) 03/04/2017  . MDD (major depressive disorder), recurrent episode ([place]) 03/02/2017  . Suicide attempt ([place]) 10/15/2016  . HTN (hypertension), benign 10/15/2016  . Irregular heart beat 10/15/2016  . Suicidal ideation 10/15/2016  . Alcoholic intoxication without  complication [facility])     Past Surgical History:  Procedure Laterality Date  . c sections    . TUBAL LIGATION       OB History   No obstetric history on file.      Home Medications    Prior to Admission medications   Medication Sig Start Date End Date Taking? Authorizing Provider  ARIPiprazole (ABILIFY) 5 MG tablet Take 1 tablet (5 mg total) by mouth daily. 03/04/18  Yes Nita Sells, MD  gabapentin (NEURONTIN) 300 MG capsule Take 1 capsule (300 mg total) by mouth 4 (four) times daily. For withdrawal symptoms 03/04/18  Yes Nita Sells, MD  levothyroxine (SYNTHROID, LEVOTHROID) 25 MCG tablet Take 1 tablet (25 mcg total) by mouth daily before breakfast. 03/04/18  Yes Nita Sells, MD  lisinopril (PRINIVIL,ZESTRIL) 10 MG tablet Take 1 tablet (10 mg total) by mouth daily. 03/04/18  Yes Nita Sells, MD  Multiple Vitamins-Minerals (MULTIVITAMIN WITH MINERALS) tablet Take 1 tablet by mouth daily.   Yes [provider]  nicotine (NICODERM CQ - DOSED IN MG/24 HOURS) 21 mg/24hr patch Place 1 patch (21 mg total) onto the skin daily. 03/05/18  Yes Nita Sells, MD  prazosin (MINIPRESS) 1 MG capsule Take 1 capsule (1 mg total) by mouth at bedtime. 03/04/18  Yes Nita Sells, MD  sertraline (ZOLOFT) 50 MG tablet Take 1 tablet (50 mg total) by mouth daily. 03/04/18  Yes Nita Sells, MD  traZODone (DESYREL) 100 MG tablet Take 1 tablet (100 mg total) by mouth at bedtime as needed for sleep. 03/04/18  Yes Samtani,  Jai-Gurmukh, MD  cefdinir (OMNICEF) 300 MG capsule Take 1 capsule (300 mg total) by mouth every 12 (twelve) hours. Patient not taking: Reported on 08/12/2018 03/04/18   Nita Sells, MD    Family History Family History  Problem Relation Age of Onset  . Hypertension Father     Social History Social History   Tobacco Use  . Smoking status: Current Every Day Smoker    Packs/day: 1.00    Types: Cigarettes  . Smokeless  tobacco: Never Used  Substance Use Topics  . Alcohol use: Not Currently  . Drug use: Yes    Types: Cocaine, Marijuana    Comment: Used cocaine 2 days ago     Allergies   Other   Review of Systems Review of Systems  Constitutional: Positive for fatigue. Negative for chills and fever.  Respiratory: Negative for chest tightness and shortness of breath.   Cardiovascular: Negative for chest pain.  Gastrointestinal: Positive for abdominal pain, diarrhea, nausea and vomiting.  Genitourinary: Negative for dysuria and urgency.  Musculoskeletal: Negative for back pain and neck pain.  Skin: Negative for color change.  All other systems reviewed and are negative.    Physical Exam Updated Vital Signs BP (!) 145/97 (BP Location: Left Arm)   Pulse 62   Temp 98.3 F (36.8 C) (Oral)   Resp 15   Ht '5\' 2"'  (1.575 m)   Wt 116.4 kg   LMP 07/29/2018 (Approximate)   SpO2 100%   BMI 46.93 kg/m   Physical Exam Vitals signs and nursing note reviewed.  Constitutional:      General: She is not in acute distress.    Appearance: She is well-developed.  HENT:     Head: Normocephalic and atraumatic.  Eyes:     General: Scleral icterus present.     Conjunctiva/sclera: Conjunctivae normal.  Neck:     Musculoskeletal: Neck supple.  Cardiovascular:     Rate and Rhythm: Normal rate and regular rhythm.     Heart sounds: Normal heart sounds. No murmur.  Pulmonary:     Effort: Pulmonary effort is normal. No respiratory distress.     Breath sounds: Normal breath sounds.  Abdominal:     General: Abdomen is flat. Bowel sounds are normal.     Palpations: Abdomen is soft.     Tenderness: There is abdominal tenderness in the right upper quadrant and epigastric area. There is no guarding or rebound.  Skin:    General: Skin is warm and dry.     Coloration: Skin is not jaundiced.  Neurological:     Mental Status: She is alert.      ED Treatments / Results  Labs (all labs ordered are listed,  but only abnormal results are displayed) Labs Reviewed  COMPREHENSIVE METABOLIC PANEL - Abnormal; Notable for the following components:      Result Value   Glucose, Bld 115 (*)    Calcium 8.6 (*)    Albumin 3.3 (*)    AST 589 (*)    ALT 852 (*)    All other components within normal limits  CBC - Abnormal; Notable for the following components:   Hemoglobin 10.2 (*)    HCT 35.2 (*)    MCV 79.6 (*)    MCH 23.1 (*)    MCHC 29.0 (*)    RDW 15.6 (*)    All other components within normal limits  ACETAMINOPHEN LEVEL - Abnormal; Notable for the following components:   Acetaminophen (Tylenol), Serum <10 (*)  All other components within normal limits  LIPASE, BLOOD  URINALYSIS, ROUTINE W REFLEX MICROSCOPIC  PROTIME-INR  APTT  TROPONIN I  AMMONIA  I-STAT BETA HCG BLOOD, ED (MC, WL, AP ONLY)    EKG None  Radiology US Abdomen Limited Ruq  Result Date: 08/13/2018 CLINICAL DATA:  Hepatitis EXAM: ULTRASOUND ABDOMEN LIMITED RIGHT UPPER QUADRANT COMPARISON:  None. FINDINGS: Gallbladder: No gallstones or wall thickening visualized. No sonographic Murphy sign noted by sonographer. Common bile duct: Diameter: 1.8 mm Liver: No focal lesion identified. Within normal limits in parenchymal echogenicity. Portal vein is patent on color Doppler imaging with normal direction of blood flow towards the liver. IMPRESSION: No acute abnormality noted. Electronically Signed   By: Inez Catalina M.D.   On: 08/13/2018 00:30    Procedures Procedures (including critical care time)  Medications Ordered in ED Medications  nicotine (NICODERM CQ - dosed in mg/24 hours) patch 21 mg (21 mg Transdermal Patch Applied 08/12/18 2148)  diphenhydrAMINE (BENADRYL) injection 25 mg (has no administration in time range)  sodium chloride 0.9 % bolus 1,000 mL (0 mLs Intravenous Stopped 08/12/18 2248)  promethazine (PHENERGAN) injection 25 mg (25 mg Intravenous Given 08/12/18 2311)     Initial Impression / Assessment and  Plan / ED Course  I have reviewed the triage vital signs and the nursing notes.  Pertinent labs & imaging results that were available during my care of the patient were reviewed by me and considered in my medical decision making (see chart for details).    Patient presents today for evaluation of abdominal pain.  She has a history of hepatitis C, however chart review shows that normally she has normal LFTs.  Clinically she reports symptoms consistent with liver failure including pruritus, and has mild scleral icterus.  Her LFTs are significantly elevated with an AST of 589 and an ALT of 852.  Her alk phos is not elevated.  Right upper quadrant ultrasound was performed which did not show evidence of gallbladder wall thickening or common bile duct dilation.  Her Tylenol level is undetectable.  Her PT/INR and PTT are both normal.  Her troponin is not elevated she does not have evidence of urinary tract infection.  Her lipase is not elevated.  Her symptoms were treated in the emergency room with Phenergan, and Benadryl along with fluids.  She wishes to avoid narcotics currently due to her history.   Spoke with Dr. Hal Hope who agreed to admit patient.   Final Clinical Impressions(s) / ED Diagnoses   Final diagnoses:  Hepatitis    ED Discharge Orders    None       Lorin Glass, PA-C 08/13/18 0046    Lorin Glass, PA-C 08/13/18 0050    Pattricia Boss, MD 08/17/18 [phone]

## 2018-08-13 ENCOUNTER — Observation Stay (HOSPITAL_COMMUNITY): Payer: Self-pay

## 2018-08-13 ENCOUNTER — Encounter (HOSPITAL_COMMUNITY): Payer: Self-pay | Admitting: Internal Medicine

## 2018-08-13 DIAGNOSIS — R112 Nausea with vomiting, unspecified: Secondary | ICD-10-CM | POA: Diagnosis present

## 2018-08-13 DIAGNOSIS — B179 Acute viral hepatitis, unspecified: Secondary | ICD-10-CM | POA: Diagnosis present

## 2018-08-13 LAB — PROTIME-INR
INR: 1.03
PROTHROMBIN TIME: 13.4 s (ref 11.4–15.2)

## 2018-08-13 LAB — HEPATIC FUNCTION PANEL
ALBUMIN: 2.8 g/dL — AB (ref 3.5–5.0)
ALK PHOS: 65 U/L (ref 38–126)
ALT: 660 U/L — ABNORMAL HIGH (ref 0–44)
AST: 450 U/L — ABNORMAL HIGH (ref 15–41)
Bilirubin, Direct: 0.2 mg/dL (ref 0.0–0.2)
Indirect Bilirubin: 0.3 mg/dL (ref 0.3–0.9)
Total Bilirubin: 0.5 mg/dL (ref 0.3–1.2)
Total Protein: 5.7 g/dL — ABNORMAL LOW (ref 6.5–8.1)

## 2018-08-13 LAB — RAPID URINE DRUG SCREEN, HOSP PERFORMED
Amphetamines: NOT DETECTED
Barbiturates: NOT DETECTED
Benzodiazepines: NOT DETECTED
Cocaine: POSITIVE — AB
Opiates: POSITIVE — AB
TETRAHYDROCANNABINOL: NOT DETECTED

## 2018-08-13 MED ORDER — IOHEXOL 300 MG/ML  SOLN
100.0000 mL | Freq: Once | INTRAMUSCULAR | Status: AC | PRN
  Administered 2018-08-13: 100 mL via INTRAVENOUS

## 2018-08-13 MED ORDER — NICOTINE 21 MG/24HR TD PT24: 21.0000 mg | MEDICATED_PATCH | Freq: Once | TRANSDERMAL | Status: DC

## 2018-08-13 MED ORDER — NICOTINE 21 MG/24HR TD PT24
21.0000 mg | MEDICATED_PATCH | Freq: Every day | TRANSDERMAL | Status: DC
  Administered 2018-08-13 – 2018-08-14 (×2): 21 mg via TRANSDERMAL
  Filled 2018-08-13 (×2): qty 1

## 2018-08-13 MED ORDER — POLYETHYLENE GLYCOL 3350 17 G PO PACK
17.0000 g | PACK | Freq: Every day | ORAL | Status: DC | PRN
  Administered 2018-08-14: 17 g via ORAL
  Filled 2018-08-13: qty 1

## 2018-08-13 MED ORDER — MORPHINE SULFATE (PF) 2 MG/ML IV SOLN
1.0000 mg | INTRAVENOUS | Status: DC | PRN
  Administered 2018-08-13 – 2018-08-14 (×5): 1 mg via INTRAVENOUS
  Filled 2018-08-13 (×5): qty 1

## 2018-08-13 MED ORDER — SODIUM CHLORIDE (PF) 0.9 % IJ SOLN
INTRAMUSCULAR | Status: AC
  Filled 2018-08-13: qty 50

## 2018-08-13 MED ORDER — ONDANSETRON HCL 4 MG PO TABS
4.0000 mg | ORAL_TABLET | Freq: Four times a day (QID) | ORAL | Status: DC | PRN
  Administered 2018-08-13: 4 mg via ORAL
  Filled 2018-08-13: qty 1

## 2018-08-13 MED ORDER — ONDANSETRON HCL 4 MG/2ML IJ SOLN
4.0000 mg | Freq: Four times a day (QID) | INTRAMUSCULAR | Status: DC | PRN
  Administered 2018-08-14 (×3): 4 mg via INTRAVENOUS
  Filled 2018-08-13 (×3): qty 2

## 2018-08-13 MED ORDER — SENNOSIDES-DOCUSATE SODIUM 8.6-50 MG PO TABS
1.0000 | ORAL_TABLET | Freq: Two times a day (BID) | ORAL | Status: DC
  Administered 2018-08-13 – 2018-08-14 (×3): 1 via ORAL
  Filled 2018-08-13 (×5): qty 1

## 2018-08-13 MED ORDER — ADULT MULTIVITAMIN W/MINERALS CH
1.0000 | ORAL_TABLET | Freq: Every day | ORAL | Status: DC
  Administered 2018-08-14: 1 via ORAL
  Filled 2018-08-13: qty 1

## 2018-08-13 MED ORDER — BOOST / RESOURCE BREEZE PO LIQD CUSTOM
1.0000 | Freq: Three times a day (TID) | ORAL | Status: DC
  Administered 2018-08-14: 1 via ORAL

## 2018-08-13 MED ORDER — SODIUM CHLORIDE 0.9 % IV SOLN
INTRAVENOUS | Status: AC
  Administered 2018-08-13: 12:00:00 via INTRAVENOUS

## 2018-08-13 MED ORDER — DIPHENHYDRAMINE HCL 50 MG/ML IJ SOLN
25.0000 mg | Freq: Once | INTRAMUSCULAR | Status: AC
  Administered 2018-08-13: 25 mg via INTRAVENOUS
  Filled 2018-08-13: qty 1

## 2018-08-13 NOTE — Progress Notes (Signed)
Initial Nutrition Assessment  DOCUMENTATION CODES:   Morbid obesity  INTERVENTION:  Boost Breeze po TID, each supplement provides 250 kcal and 9 grams of protein MVI Recommend stool softener d/t patient without BM x 4days  NUTRITION DIAGNOSIS:   Inadequate oral intake related to altered GI function, nausea, vomiting(acute hepatitis) as evidenced by estimated needs, energy intake < 75% for > 7 days(CL diet).  GOAL:   Patient will meet greater than or equal to 90% of their needs   MONITOR:   Diet advancement, PO intake, Supplement acceptance, I & O's  REASON FOR ASSESSMENT:   Malnutrition Screening Tool    ASSESSMENT:   32 year old patient with history of Hep C that has not been treated, polysubstance abuse who presented to ED with complaints of rt upper quadrant pain, n/v x 1 week. Patient admitted with acute hepatitis with positive drug screen for opiates and cocaine  Patient reports just finishing with CL tray without GI discomfort at RD visit. Patient stated she was starving and she enjoyed the broth and had 2 cups coffee. Patient has not had BM in 4 days and hoping the coffee would help to get things moving.   Patient reports recent relapse; admitting to cocaine use over the past week. Patient was very addiment and denies opiate use. She is questioning if the cocaine had been mixed with something else and reports experiencing RLS last night and n/v prior to ED arrival.  Patient reports UBW of 230- 240lbs with recent wt gains due to being a recovering alcoholic and more recent UBW of 260lb. She shared that the wt gains are what tempted her to use cocaine again. She reports family members weighing 500-600lbs and did not want that for herself.   Patient works at [employer] 9-10hrs/day and 1 meal/day is typical for her. She does not eat at work because they charge for meals and waits until she gets home where she eats leftovers (alfredo, meatloaf, chicken and veggies)   Medications  reviewed and include: morphine 0.9% NaCl @ 16825mL/hr   Labs: AST 450, ALT 660  NUTRITION - FOCUSED PHYSICAL EXAM:    Most Recent Value  Orbital Region  Mild depletion  Upper Arm Region  No depletion  Thoracic and Lumbar Region  No depletion  Buccal Region  No depletion  Temple Region  No depletion  Clavicle Bone Region  No depletion  Clavicle and Acromion Bone Region  No depletion  Scapular Bone Region  No depletion  Dorsal Hand  Mild depletion  Patellar Region  No depletion  Anterior Thigh Region  No depletion  Posterior Calf Region  No depletion  Edema (RD Assessment)  None  Hair  Reviewed  Eyes  Reviewed  Mouth  Reviewed  Skin  Reviewed  Nails  Reviewed       Diet Order:   Diet Order            Diet clear liquid Room service appropriate? Yes; Fluid consistency: Thin  Diet effective now              EDUCATION NEEDS:   No education needs have been identified at this time  Skin:  Skin Assessment: Reviewed RN Assessment  Last BM:  08/10/2018  Height:   Ht Readings from Last 1 Encounters:  08/12/18 5\' 2"  (1.575 m)    Weight:   Wt Readings from Last 1 Encounters:  08/13/18 116.5 kg    Ideal Body Weight:  50 kg  BMI:  Body mass  index is 46.98 kg/m.  Estimated Nutritional Needs:   Kcal:  [id number] (MSJ 1.2-1.3)  Protein:  60-75g (g/kg/IBW)  Fluid:  </=2L/day    Lars MassonSuzanne Nairobi Gustafson, RD, LDN  After Hours/Weekend Pager: [phone]

## 2018-08-13 NOTE — ED Notes (Signed)
ED TO INPATIENT HANDOFF REPORT  Name/Age/Gender Princella IonMinnie Reaser 32 y.o. female  Code Status Code Status History    Date Active Date Inactive Code Status Order ID Comments User Context   02/28/2018 0128 03/04/2018 1953 Full Code [id number]  Pearson GrippeKim, James, MD ED   02/24/2018 0118 02/27/2018 1944 Full Code [id number]  Jackelyn PolingBerry, Jason A, NP Inpatient   02/23/2018 1607 02/24/2018 0046 Full Code [id number]  Arthor CaptainHarris, Abigail, PA-C ED   02/01/2018 2159 02/07/2018 1447 Full Code [id number]  Oneta RackLewis, Tanika N, NP Inpatient   02/01/2018 0558 02/01/2018 2149 Full Code [id number]  Gilda CreasePollina, Christopher J, MD ED   09/01/2017 0350 09/01/2017 2017 Full Code [id number]  Dione BoozeGlick, David, MD ED   08/04/2017 0536 08/06/2017 1147 Full Code [id number]  Garlon HatchetSanders, Lisa M, PA-C ED   07/05/2017 0030 07/05/2017 1944 Full Code [id number]  Shon BatonHorton, Courtney F, MD ED   06/12/2017 0229 06/12/2017 1659 Full Code [id number]  Ward, Layla MawKristen N, DO ED   03/02/2017 1544 03/05/2017 1534 Full Code [id number]  Thermon Leylandavis, Laura A, NP HOV   02/11/2017 1129 02/19/2017 0927 Full Code [id number]  Laveda AbbeParks, Laurie Britton, NP Inpatient   02/11/2017 0145 02/11/2017 1015 Full Code [id number]  Shon BatonHorton, Courtney F, MD ED   10/17/2016 1643 10/21/2016 2033 Full Code [id number]  Laveda AbbeParks, Laurie Britton, NP Inpatient   10/15/2016 0724 10/17/2016 1532 Full Code [id number]  Richarda OverlieAbrol, Nayana, MD ED      Home/SNF/Other Home  Chief Complaint vomiting   Level of Care/Admitting Diagnosis ED Disposition    ED Disposition Condition Comment   Admit  [facility] [[id number]]  Level of Care: Med-Surg [16]  Diagnosis: Acute hepatitis [270350][[id number]]  Admitting Physician: Eduard ClosKAKRAKANDY, ARSHAD N [phone][[id number]]  Attending Physician: Eduard ClosKAKRAKANDY, ARSHAD N [[id number]]  PT Class (Do Not Modify): Observation [104]  PT Acc Code (Do Not Modify): Observation [10022]       Medical History Past Medical History:  Diagnosis Date  . Chest pain   . Gallstones   . Hepatitis C   . Hypertension   . Irregular  heart beat     Allergies Allergies  Allergen Reactions  . Other Other (See Comments)    benzoyl peroxide - used topically for acne cause facial and tongue swelling    IV Location/Drains/Wounds Patient Lines/Drains/Airways Status   Active Line/Drains/Airways    Name:   Placement date:   Placement time:   Site:   Days:   Peripheral IV 08/12/18 Right Forearm   08/12/18    2135    Forearm   1          Labs/Imaging Results for orders placed or performed during the hospital encounter of 08/12/18 (from the past 48 hour(s))  Lipase, blood     Status: None   Collection Time: 08/12/18  6:46 PM  Result Value Ref Range   Lipase 34 11 - 51 U/L    Comment: Performed at [facility], [address] [place], [place] [address]  Comprehensive metabolic panel     Status: Abnormal   Collection Time: 08/12/18  6:46 PM  Result Value Ref Range   Sodium 138 135 - 145 mmol/L   Potassium 4.4 3.5 - 5.1 mmol/L   Chloride 108 98 - 111 mmol/L   CO2 23 22 - 32 mmol/L   Glucose, Bld 115 (H) 70 - 99 mg/dL   BUN 13 6 - 20 mg/dL   Creatinine, Ser 3.710.85 0.44 - 1.00 mg/dL   Calcium 8.6 (L) 8.9 - 10.3  mg/dL   Total Protein 6.9 6.5 - 8.1 g/dL   Albumin 3.3 (L) 3.5 - 5.0 g/dL   AST 161 (H) 15 - 41 U/L   ALT 852 (H) 0 - 44 U/L   Alkaline Phosphatase 85 38 - 126 U/L   Total Bilirubin 0.5 0.3 - 1.2 mg/dL   GFR calc non Af Amer >60 >60 mL/min   GFR calc Af Amer >60 >60 mL/min   Anion gap 7 5 - 15    Comment: Performed at [facility], [address], [place], [place] [address]  CBC     Status: Abnormal   Collection Time: 08/12/18  6:46 PM  Result Value Ref Range   WBC 5.1 4.0 - 10.5 K/uL   RBC 4.42 3.87 - 5.11 MIL/uL   Hemoglobin 10.2 (L) 12.0 - 15.0 g/dL   HCT 54.0 (L) 98.1 - 19.1 %   MCV 79.6 (L) 80.0 - 100.0 fL   MCH 23.1 (L) 26.0 - 34.0 pg   MCHC 29.0 (L) 30.0 - 36.0 g/dL   RDW 47.8 (H) 29.5 - 62.1 %   Platelets 290 150 - 400 K/uL   nRBC 0.0 0.0 - 0.2 %     Comment: Performed at [facility], [address] [place], [place] [address]  I-Stat beta hCG blood, ED     Status: None   Collection Time: 08/12/18  6:50 PM  Result Value Ref Range   I-stat hCG, quantitative <5.0 <5 mIU/mL   Comment 3            Comment:   GEST. AGE      CONC.  (mIU/mL)   <=1 WEEK        5 - 50     2 WEEKS       50 - 500     3 WEEKS       100 - 10,000     4 WEEKS     1,000 - 30,000        FEMALE AND NON-PREGNANT FEMALE:     LESS THAN 5 mIU/mL   Urinalysis, Routine w reflex microscopic     Status: None   Collection Time: 08/12/18  7:29 PM  Result Value Ref Range   Color, Urine YELLOW YELLOW   APPearance CLEAR CLEAR   Specific Gravity, Urine 1.018 1.005 - 1.030   pH 6.0 5.0 - 8.0   Glucose, UA NEGATIVE NEGATIVE mg/dL   Hgb urine dipstick NEGATIVE NEGATIVE   Bilirubin Urine NEGATIVE NEGATIVE   Ketones, ur NEGATIVE NEGATIVE mg/dL   Protein, ur NEGATIVE NEGATIVE mg/dL   Nitrite NEGATIVE NEGATIVE   Leukocytes, UA NEGATIVE NEGATIVE    Comment: Performed at [facility], [address], [place], [place] [address]  Protime-INR     Status: None   Collection Time: 08/12/18  9:14 PM  Result Value Ref Range   Prothrombin Time 12.9 11.4 - 15.2 seconds   INR 0.98     Comment: Performed at [facility], [address], [place], [place] [address]  APTT     Status: None   Collection Time: 08/12/18  9:14 PM  Result Value Ref Range   aPTT 27 24 - 36 seconds    Comment: Performed at [facility], [address] [place], [place] [address]  Troponin I - Once     Status: None   Collection Time: 08/12/18  9:14 PM  Result Value Ref Range  Troponin I <0.03 <0.03 ng/mL    Comment: Performed at [facility], [address] [place], [place] [address]  Ammonia     Status: None   Collection Time: 08/12/18  9:14 PM  Result Value Ref Range   Ammonia 34 9 - 35 umol/L    Comment:  Performed at [facility], [address] [place], [place] [address]  Acetaminophen level     Status: Abnormal   Collection Time: 08/12/18  9:14 PM  Result Value Ref Range   Acetaminophen (Tylenol), Serum <10 (L) 10 - 30 ug/mL    Comment: (NOTE) Therapeutic concentrations vary significantly. A range of 10-30 ug/mL  may be an effective concentration for many patients. However, some  are best treated at concentrations outside of this range. Acetaminophen concentrations >150 ug/mL at 4 hours after ingestion  and >50 ug/mL at 12 hours after ingestion are often associated with  toxic reactions. Performed at [facility], [address] [place], [place] [address]    [place] Abdomen Limited Ruq  Result Date: 08/13/2018 CLINICAL DATA:  Hepatitis EXAM: ULTRASOUND ABDOMEN LIMITED RIGHT UPPER QUADRANT COMPARISON:  None. FINDINGS: Gallbladder: No gallstones or wall thickening visualized. No sonographic Murphy sign noted by sonographer. Common bile duct: Diameter: 1.8 mm Liver: No focal lesion identified. Within normal limits in parenchymal echogenicity. Portal vein is patent on color Doppler imaging with normal direction of blood flow towards the liver. IMPRESSION: No acute abnormality noted. Electronically Signed   By: Alcide CleverMark  Lukens M.D.   On: 08/13/2018 00:30    Pending Labs Unresulted Labs (From admission, onward)   None      Vitals/Pain Today's Vitals   08/12/18 2109 08/12/18 2303 08/13/18 0000 08/13/18 0100  BP: (!) 149/88 (!) 145/97 (!) 163/75 139/74  Pulse: 67 62 73 79  Resp: 15 15 (!) 22 20  Temp:      TempSrc:      SpO2: 100% 100% 100% 100%  Weight:      Height:      PainSc:        Isolation Precautions No active isolations  Medications Medications  nicotine (NICODERM CQ - dosed in mg/24 hours) patch 21 mg (21 mg Transdermal Patch Applied 08/12/18 2148)  diphenhydrAMINE (BENADRYL) injection 25 mg (has no administration in time range)   sodium chloride 0.9 % bolus 1,000 mL (0 mLs Intravenous Stopped 08/12/18 2248)  promethazine (PHENERGAN) injection 25 mg (25 mg Intravenous Given 08/12/18 2311)    Mobility walks

## 2018-08-13 NOTE — Progress Notes (Signed)
Patient ID: Shirley Murphy, female   DOB: May 01, 1986, 32 y.o.   MRN: [id number] Patient was admitted early this morning for abdominal pain, nausea and vomiting.  She was found to have elevated LFTs.  Right upper quadrant ultrasound was negative for anything acute.  Patient seen and examined at bedside.  Plan of care discussed with her.  Urine drug screen is positive for opiates and cocaine.  Follow CT scan of the abdomen.  Repeat a.m. labs.

## 2018-08-13 NOTE — H&P (Signed)
History and Physical    Shirley Murphy BMW:[id number] DOB: [birth year] DOA: 08/12/2018  PCP: Patient, No Pcp Per  Patient coming from: Home.  Chief Complaint: Abdominal pain nausea vomiting.  HPI: Shirley Murphy is a 32 y.o. female with history of previous hepatitis C has not been treated as per the patient and history of polysubstance abuse has not had any alcohol for at least 3 months presents to the ER with complaints of persistent right upper quadrant pain with multiple episodes of nausea vomiting for last 1 week.  Denies any fever chills.  Denies any diarrhea.  Has not consumed any Tylenol but has taken ibuprofen for the pain.  Denies any blood in the vomitus vomitus is usually biliary in nature.  Denies any recent travel or sick contacts.  ED Course: In the ER patient had labs drawn which showed LFTs markedly elevated at AST of 89 ALT of 852 albumin was 3.3 globin was higher.  Lipase was normal right upper quadrant ultrasound was negative for anything acute.  On exam patient has tenderness in the right upper quadrant.  Patient admitted for acute hepatitis of unknown etiology with nausea vomiting.  Review of Systems: As per HPI, rest all negative.   Past Medical History:  Diagnosis Date  . Chest pain   . Gallstones   . Hepatitis C   . Hypertension   . Irregular heart beat     Past Surgical History:  Procedure Laterality Date  . c sections    . TUBAL LIGATION       reports that she has been smoking cigarettes. She has been smoking about 1.00 pack per day. She has never used smokeless tobacco. She reports previous alcohol use. She reports current drug use. Drugs: Cocaine and Marijuana.  Allergies  Allergen Reactions  . Other Other (See Comments)    benzoyl peroxide - used topically for acne cause facial and tongue swelling    Family History  Problem Relation Age of Onset  . Hypertension Father   . Pancreatic cancer Father     Prior to Admission medications     Medication Sig Start Date End Date Taking? Authorizing Provider  ARIPiprazole (ABILIFY) 5 MG tablet Take 1 tablet (5 mg total) by mouth daily. 03/04/18  Yes Rhetta Mura, MD  gabapentin (NEURONTIN) 300 MG capsule Take 1 capsule (300 mg total) by mouth 4 (four) times daily. For withdrawal symptoms 03/04/18  Yes Rhetta Mura, MD  levothyroxine (SYNTHROID, LEVOTHROID) 25 MCG tablet Take 1 tablet (25 mcg total) by mouth daily before breakfast. 03/04/18  Yes Rhetta Mura, MD  lisinopril (PRINIVIL,ZESTRIL) 10 MG tablet Take 1 tablet (10 mg total) by mouth daily. 03/04/18  Yes Rhetta Mura, MD  Multiple Vitamins-Minerals (MULTIVITAMIN WITH MINERALS) tablet Take 1 tablet by mouth daily.   Yes [provider]  nicotine (NICODERM CQ - DOSED IN MG/24 HOURS) 21 mg/24hr patch Place 1 patch (21 mg total) onto the skin daily. 03/05/18  Yes Rhetta Mura, MD  prazosin (MINIPRESS) 1 MG capsule Take 1 capsule (1 mg total) by mouth at bedtime. 03/04/18  Yes Rhetta Mura, MD  sertraline (ZOLOFT) 50 MG tablet Take 1 tablet (50 mg total) by mouth daily. 03/04/18  Yes Rhetta Mura, MD  traZODone (DESYREL) 100 MG tablet Take 1 tablet (100 mg total) by mouth at bedtime as needed for sleep. 03/04/18  Yes Rhetta Mura, MD  cefdinir (OMNICEF) 300 MG capsule Take 1 capsule (300 mg total) by mouth every 12 (twelve) hours. Patient not  taking: Reported on 08/12/2018 03/04/18   Rhetta MuraSamtani, Jai-Gurmukh, MD    Physical Exam: Vitals:   08/13/18 0000 08/13/18 0100 08/13/18 0200 08/13/18 0258  BP: (!) 163/75 139/74 (!) 143/93 138/89  Pulse: 73 79 66 60  Resp: (!) 22 20 14 18   Temp:    97.7 F (36.5 C)  TempSrc:    Oral  SpO2: 100% 100% 100% 100%  Weight:      Height:          Constitutional: Moderately built and nourished. Vitals:   08/13/18 0000 08/13/18 0100 08/13/18 0200 08/13/18 0258  BP: (!) 163/75 139/74 (!) 143/93 138/89  Pulse: 73 79 66 60  Resp: (!)  22 20 14 18   Temp:    97.7 F (36.5 C)  TempSrc:    Oral  SpO2: 100% 100% 100% 100%  Weight:      Height:       Eyes: Anicteric no pallor. ENMT: No discharge from the ears eyes nose or mouth. Neck: No mass felt.  No neck rigidity. Respiratory: No rhonchi or crepitations. Cardiovascular: S1-S2 heard. Abdomen: Right upper quadrant tenderness no guarding or rigidity. Musculoskeletal: No edema. Skin: No rash. Neurologic: Alert awake oriented to time place and person.  Moves all extremities. Psychiatric: Appears normal.   Labs on Admission: I have personally reviewed following labs and imaging studies  CBC: Recent Labs  Lab 08/12/18 1846  WBC 5.1  HGB 10.2*  HCT 35.2*  MCV 79.6*  PLT 290   Basic Metabolic Panel: Recent Labs  Lab 08/12/18 1846  NA 138  K 4.4  CL 108  CO2 23  GLUCOSE 115*  BUN 13  CREATININE 0.85  CALCIUM 8.6*   GFR: Estimated Creatinine Clearance: 116 mL/min (by C-G formula based on SCr of 0.85 mg/dL). Liver Function Tests: Recent Labs  Lab 08/12/18 1846  AST 589*  ALT 852*  ALKPHOS 85  BILITOT 0.5  PROT 6.9  ALBUMIN 3.3*   Recent Labs  Lab 08/12/18 1846  LIPASE 34   Recent Labs  Lab 08/12/18 2114  AMMONIA 34   Coagulation Profile: Recent Labs  Lab 08/12/18 2114  INR 0.98   Cardiac Enzymes: Recent Labs  Lab 08/12/18 2114  TROPONINI <0.03   BNP (last 3 results) No results for input(s): PROBNP in the last 8760 hours. HbA1C: No results for input(s): HGBA1C in the last 72 hours. CBG: No results for input(s): GLUCAP in the last 168 hours. Lipid Profile: No results for input(s): CHOL, HDL, LDLCALC, TRIG, CHOLHDL, LDLDIRECT in the last 72 hours. Thyroid Function Tests: No results for input(s): TSH, T4TOTAL, FREET4, T3FREE, THYROIDAB in the last 72 hours. Anemia Panel: No results for input(s): VITAMINB12, FOLATE, FERRITIN, TIBC, IRON, RETICCTPCT in the last 72 hours. Urine analysis:    Component Value Date/Time    COLORURINE YELLOW 08/12/2018 1929   APPEARANCEUR CLEAR 08/12/2018 1929   LABSPEC 1.018 08/12/2018 1929   PHURINE 6.0 08/12/2018 1929   GLUCOSEU NEGATIVE 08/12/2018 1929   HGBUR NEGATIVE 08/12/2018 1929   BILIRUBINUR NEGATIVE 08/12/2018 1929   KETONESUR NEGATIVE 08/12/2018 1929   PROTEINUR NEGATIVE 08/12/2018 1929   NITRITE NEGATIVE 08/12/2018 1929   LEUKOCYTESUR NEGATIVE 08/12/2018 1929   Sepsis Labs: @LABRCNTIP (procalcitonin:4,lacticidven:4) )No results found for this or any previous visit (from the past 240 hour(s)).   Radiological Exams on Admission: Kore[place] Abdomen Limited Ruq  Result Date: 08/13/2018 CLINICAL DATA:  Hepatitis EXAM: ULTRASOUND ABDOMEN LIMITED RIGHT UPPER QUADRANT COMPARISON:  None. FINDINGS: Gallbladder: No gallstones or wall  thickening visualized. No sonographic Murphy sign noted by sonographer. Common bile duct: Diameter: 1.8 mm Liver: No focal lesion identified. Within normal limits in parenchymal echogenicity. Portal vein is patent on color Doppler imaging with normal direction of blood flow towards the liver. IMPRESSION: No acute abnormality noted. Electronically Signed   By: Alcide CleverMark  Lukens M.D.   On: 08/13/2018 00:30    EKG: Independently reviewed.  Normal sinus rhythm.  Assessment/Plan Principal Problem:   Acute hepatitis Active Problems:   Nausea & vomiting    1. Acute hepatitis with right upper quadrant tenderness with nausea vomiting -we will check acute hepatitis panel.  Check ANA to rule out any autoimmune acute hepatitis I also check antimitochondrial antibodies.  Patient denies drinking alcohol or using any Tylenol.  I have ordered CT abdomen pelvis.  We will keep patient on clear liquid diet.  Follow LFTs. 2. Nausea vomiting -could be from gastroenteritis.  Continue with hydration follow LFTs intake output.   DVT prophylaxis: SCDs. Code Status: Full code. Family Communication: Discussed with patient. Disposition Plan: Home. Consults called:  None. Admission status: Observation.   Eduard ClosArshad N Marcas Bowsher MD [facility] Pager [phone].  If 7PM-7AM, please contact night-coverage [email or web address] Password [email or web address]  08/13/2018, 3:24 AM

## 2018-08-14 DIAGNOSIS — F1910 Other psychoactive substance abuse, uncomplicated: Secondary | ICD-10-CM

## 2018-08-14 DIAGNOSIS — R945 Abnormal results of liver function studies: Secondary | ICD-10-CM

## 2018-08-14 LAB — COMPREHENSIVE METABOLIC PANEL
ALT: 660 U/L — ABNORMAL HIGH (ref 0–44)
AST: 412 U/L — ABNORMAL HIGH (ref 15–41)
Albumin: 3.1 g/dL — ABNORMAL LOW (ref 3.5–5.0)
Alkaline Phosphatase: 74 U/L (ref 38–126)
Anion gap: 9 (ref 5–15)
BUN: 10 mg/dL (ref 6–20)
CO2: 21 mmol/L — ABNORMAL LOW (ref 22–32)
Calcium: 8.7 mg/dL — ABNORMAL LOW (ref 8.9–10.3)
Chloride: 108 mmol/L (ref 98–111)
Creatinine, Ser: 0.9 mg/dL (ref 0.44–1.00)
GFR calc Af Amer: >60 mL/min (ref >60)
GFR calc non Af Amer: >60 mL/min (ref >60)
Glucose, Bld: 111 mg/dL — ABNORMAL HIGH (ref 70–99)
POTASSIUM: 4.2 mmol/L (ref 3.5–5.1)
Sodium: 138 mmol/L (ref 135–145)
Total Bilirubin: 0.5 mg/dL (ref 0.3–1.2)
Total Protein: 6.3 g/dL — ABNORMAL LOW (ref 6.5–8.1)

## 2018-08-14 LAB — ANA: Anti Nuclear Antibody(ANA): NEGATIVE

## 2018-08-14 LAB — CBC WITH DIFFERENTIAL/PLATELET
Abs Immature Granulocytes: 0.02 10*3/uL (ref 0.00–0.07)
Basophils Absolute: 0 10*3/uL (ref 0.0–0.1)
Basophils Relative: 0 %
Eosinophils Absolute: 0.2 10*3/uL (ref 0.0–0.5)
Eosinophils Relative: 4 %
HCT: 33.1 % — ABNORMAL LOW (ref 36.0–46.0)
Hemoglobin: 9.5 g/dL — ABNORMAL LOW (ref 12.0–15.0)
Immature Granulocytes: 0 %
Lymphocytes Relative: 41 %
Lymphs Abs: 1.9 10*3/uL (ref 0.7–4.0)
MCH: 22.9 pg — AB (ref 26.0–34.0)
MCHC: 28.7 g/dL — ABNORMAL LOW (ref 30.0–36.0)
MCV: 80 fL (ref 80.0–100.0)
Monocytes Absolute: 0.5 10*3/uL (ref 0.1–1.0)
Monocytes Relative: 12 %
Neutro Abs: 1.9 10*3/uL (ref 1.7–7.7)
Neutrophils Relative %: 43 %
Platelets: 253 10*3/uL (ref 150–400)
RBC: 4.14 MIL/uL (ref 3.87–5.11)
RDW: 15.7 % — ABNORMAL HIGH (ref 11.5–15.5)
WBC: 4.5 10*3/uL (ref 4.0–10.5)
nRBC: 0 % (ref 0.0–0.2)

## 2018-08-14 LAB — HEPATITIS PANEL, ACUTE
HCV Ab: >11 s/co ratio — ABNORMAL HIGH (ref 0.0–0.9)
HEP B S AG: NEGATIVE
Hep A IgM: NEGATIVE
Hep B C IgM: NEGATIVE

## 2018-08-14 LAB — MAGNESIUM: Magnesium: 2.2 mg/dL (ref 1.7–2.4)

## 2018-08-14 MED ORDER — FAMOTIDINE 20 MG PO TABS: 20.0000 mg | ORAL_TABLET | Freq: Two times a day (BID) | ORAL | 0 refills | Status: DC

## 2018-08-14 MED ORDER — FAMOTIDINE IN NACL 20-0.9 MG/50ML-% IV SOLN
20.0000 mg | Freq: Two times a day (BID) | INTRAVENOUS | Status: DC
  Administered 2018-08-14 (×2): 20 mg via INTRAVENOUS
  Filled 2018-08-14 (×4): qty 50

## 2018-08-14 MED ORDER — ALUM & MAG HYDROXIDE-SIMETH 200-200-20 MG/5ML PO SUSP
30.0000 mL | ORAL | Status: DC | PRN
  Administered 2018-08-14: 30 mL via ORAL
  Filled 2018-08-14: qty 30

## 2018-08-14 MED ORDER — ALUM & MAG HYDROXIDE-SIMETH 200-200-20 MG/5ML PO SUSP: 15.0000 mL | Freq: Four times a day (QID) | ORAL | 0 refills | Status: DC | PRN

## 2018-08-14 MED ORDER — ONDANSETRON HCL 4 MG PO TABS: 4.0000 mg | ORAL_TABLET | Freq: Four times a day (QID) | ORAL | 0 refills | Status: DC | PRN

## 2018-08-14 NOTE — Discharge Summary (Signed)
Physician Discharge Summary  Shirley Murphy ZOX:[id number] DOB: 20-Feb-1986 DOA: 08/12/2018  PCP: Patient, No Pcp Per  Admit date: 08/12/2018 Discharge date: 08/14/2018  Admitted From: Home Disposition:  Home  Recommendations for Outpatient Follow-up:  1. Follow up with PCP in 1 week with repeat CBC/CMP 2. Follow-up in the ED if symptoms worsen or new appear 3. Abstain from illicit drug use   Home Health: No Equipment/Devices: None  Discharge Condition: Stable CODE STATUS: Full Diet recommendation: Regular  Brief/Interim Summary: 32 year old female with history of untreated hepatitis C, polysubstance abuse presented with multiple episodes of nausea with vomiting along with abdominal pain.  She was found to have markedly elevated LFTs.  She was started on intravenous fluids.  Right upper quadrant ultrasound and CT of the abdomen and pelvis was negative for any acute abnormality.  Her symptoms improved.  She is currently not vomiting.  Has tolerated liquid diet.  If she tolerates soft diet today, she will be discharged home.  Discharge Diagnoses:  Principal Problem:   Acute hepatitis Active Problems:   Nausea & vomiting  Elevated LFTs along with abdominal pain with nausea and vomiting -Questionable cause.  Might be related to drug abuse as well.  Improving.  Hepatitis panel is positive for hepatitis C antibody.  Patient has known history of untreated hepatitis C.  ANA is negative.  Tylenol level was less than 10 on admission. -Treated with intravenous fluids. -Symptoms are improving.  She has tolerated full liquid diet.  If she tolerates soft diet today, she will be discharged home.  She might have underlying gastritis as well, will discharge on oral Pepcid. -Will need repeat CMP within a week. -Might need outpatient gastroenterology evaluation  Polysubstance abuse -Urine drug screen was positive for cocaine and opiates.  Counseled about abstinence. -Social worker  consult  Morbid obesity -Outpatient follow-up    Discharge Instructions  Discharge Instructions    Ambulatory referral to Psychiatry   Complete by:  As directed    Depression   Call MD for:  difficulty breathing, headache or visual disturbances   Complete by:  As directed    Call MD for:  extreme fatigue   Complete by:  As directed    Call MD for:  hives   Complete by:  As directed    Call MD for:  persistant dizziness or light-headedness   Complete by:  As directed    Call MD for:  persistant nausea and vomiting   Complete by:  As directed    Call MD for:  severe uncontrolled pain   Complete by:  As directed    Call MD for:  temperature >100.4   Complete by:  As directed    Diet - low sodium heart healthy   Complete by:  As directed    Increase activity slowly   Complete by:  As directed      Allergies as of 08/14/2018      Reactions   Other Other (See Comments)   benzoyl peroxide - used topically for acne cause facial and tongue swelling      Medication List    STOP taking these medications   cefdinir 300 MG capsule Commonly known as:  OMNICEF   lisinopril 10 MG tablet Commonly known as:  PRINIVIL,ZESTRIL     TAKE these medications   alum & mag hydroxide-simeth 200-200-20 MG/5ML suspension Commonly known as:  MAALOX/MYLANTA Take 15 mLs by mouth every 6 (six) hours as needed for indigestion or heartburn.   ARIPiprazole  5 MG tablet Commonly known as:  ABILIFY Take 1 tablet (5 mg total) by mouth daily.   famotidine 20 MG tablet Commonly known as:  PEPCID Take 1 tablet (20 mg total) by mouth 2 (two) times daily.   gabapentin 300 MG capsule Commonly known as:  NEURONTIN Take 1 capsule (300 mg total) by mouth 4 (four) times daily. For withdrawal symptoms   levothyroxine 25 MCG tablet Commonly known as:  SYNTHROID, LEVOTHROID Take 1 tablet (25 mcg total) by mouth daily before breakfast.   multivitamin with minerals tablet Take 1 tablet by mouth  daily.   nicotine 21 mg/24hr patch Commonly known as:  NICODERM CQ - dosed in mg/24 hours Place 1 patch (21 mg total) onto the skin daily.   ondansetron 4 MG tablet Commonly known as:  ZOFRAN Take 1 tablet (4 mg total) by mouth every 6 (six) hours as needed for nausea.   prazosin 1 MG capsule Commonly known as:  MINIPRESS Take 1 capsule (1 mg total) by mouth at bedtime.   sertraline 50 MG tablet Commonly known as:  ZOLOFT Take 1 tablet (50 mg total) by mouth daily.   traZODone 100 MG tablet Commonly known as:  DESYREL Take 1 tablet (100 mg total) by mouth at bedtime as needed for sleep.      Follow-up Information    PCP. Schedule an appointment as soon as possible for a visit in 1 week(s).        Psychiatrist. Schedule an appointment as soon as possible for a visit in 1 week(s).          Allergies  Allergen Reactions  . Other Other (See Comments)    benzoyl peroxide - used topically for acne cause facial and tongue swelling    Consultations:  None   Procedures/Studies: Ct Abdomen Pelvis W Contrast  Result Date: 08/13/2018 CLINICAL DATA:  Abdominal pain. History of hepatitis-C. Right upper quadrant pain, nausea, vomiting. EXAM: CT ABDOMEN AND PELVIS WITH CONTRAST TECHNIQUE: Multidetector CT imaging of the abdomen and pelvis was performed using the standard protocol following bolus administration of intravenous contrast. CONTRAST:  100mL OMNIPAQUE IOHEXOL 300 MG/ML  SOLN COMPARISON:  Ultrasound 08/12/2018 FINDINGS: Lower chest: Linear scarring in the right middle lobe. Calcified lower mediastinal and right hilar lymph nodes compatible with old granulomatous disease. Calcified granuloma in the right lower lobe. No acute abnormality. Hepatobiliary: Diffuse low-density throughout the liver compatible with fatty infiltration. No focal abnormality. Gallbladder unremarkable. Pancreas: No focal abnormality or ductal dilatation. Spleen: Spleen is mildly enlarged with a  craniocaudal length of 13.5 cm. No focal abnormality. Adrenals/Urinary Tract: No adrenal abnormality. No focal renal abnormality. No stones or hydronephrosis. Urinary bladder is unremarkable. Stomach/Bowel: Normal appendix. Stomach, large and small bowel grossly unremarkable. Vascular/Lymphatic: No evidence of aneurysm or adenopathy. Reproductive: Uterus and adnexa unremarkable.  No mass. Other: No free fluid or free air. Musculoskeletal: No acute bony abnormality. IMPRESSION: Fatty infiltration of the liver. Mild splenomegaly. Evidence of old granulomatous disease in the lower chest. Electronically Signed   By: Charlett NoseKevin  Dover M.D.   On: 08/13/2018 10:19   [place] Abdomen Limited Ruq  Result Date: 08/13/2018 CLINICAL DATA:  Hepatitis EXAM: ULTRASOUND ABDOMEN LIMITED RIGHT UPPER QUADRANT COMPARISON:  None. FINDINGS: Gallbladder: No gallstones or wall thickening visualized. No sonographic Murphy sign noted by sonographer. Common bile duct: Diameter: 1.8 mm Liver: No focal lesion identified. Within normal limits in parenchymal echogenicity. Portal vein is patent on color Doppler imaging with normal direction of blood flow  towards the liver. IMPRESSION: No acute abnormality noted. Electronically Signed   By: Alcide CleverMark  Lukens M.D.   On: 08/13/2018 00:30     Subjective: Patient seen and examined at bedside.  She feels better.  She has tolerated full liquid diet.  She thinks she would be okay for discharge but wants solid food before she goes home.  She has intermittent nausea.  No overnight fever or vomiting.  Discharge Exam: Vitals:   08/14/18 0608 08/14/18 1402  BP: (!) 102/59 125/78  Pulse: 64 63  Resp: 20 20  Temp: 98.4 F (36.9 C) 98.7 F (37.1 C)  SpO2: 98% 100%   Vitals:   08/13/18 1348 08/13/18 2022 08/14/18 0608 08/14/18 1402  BP: 128/79 139/85 (!) 102/59 125/78  Pulse: 62 80 64 63  Resp: 18 16 20 20   Temp: 98.2 F (36.8 C) 98.4 F (36.9 C) 98.4 F (36.9 C) 98.7 F (37.1 C)  TempSrc:  Oral  Oral Oral  SpO2: 99% 100% 98% 100%  Weight:      Height:        General: Pt is alert, awake, not in acute distress Cardiovascular: rate controlled, S1/S2 + Respiratory: bilateral decreased breath sounds at bases Abdominal: Soft, morbidly obese, mild epigastric tenderness, no rebound tenderness, ND, bowel sounds + Extremities: no edema, no cyanosis    The results of significant diagnostics from this hospitalization (including imaging, microbiology, ancillary and laboratory) are listed below for reference.     Microbiology: No results found for this or any previous visit (from the past 240 hour(s)).   Labs: BNP (last 3 results) No results for input(s): BNP in the last 8760 hours. Basic Metabolic Panel: Recent Labs  Lab 08/12/18 1846 08/14/18 0532  NA 138 138  K 4.4 4.2  CL 108 108  CO2 23 21*  GLUCOSE 115* 111*  BUN 13 10  CREATININE 0.85 0.90  CALCIUM 8.6* 8.7*  MG  --  2.2   Liver Function Tests: Recent Labs  Lab 08/12/18 1846 08/13/18 0553 08/14/18 0532  AST 589* 450* 412*  ALT 852* 660* 660*  ALKPHOS 85 65 74  BILITOT 0.5 0.5 0.5  PROT 6.9 5.7* 6.3*  ALBUMIN 3.3* 2.8* 3.1*   Recent Labs  Lab 08/12/18 1846  LIPASE 34   Recent Labs  Lab 08/12/18 2114  AMMONIA 34   CBC: Recent Labs  Lab 08/12/18 1846 08/14/18 0532  WBC 5.1 4.5  NEUTROABS  --  1.9  HGB 10.2* 9.5*  HCT 35.2* 33.1*  MCV 79.6* 80.0  PLT 290 253   Cardiac Enzymes: Recent Labs  Lab 08/12/18 2114  TROPONINI <0.03   BNP: Invalid input(s): POCBNP CBG: No results for input(s): GLUCAP in the last 168 hours. D-Dimer No results for input(s): DDIMER in the last 72 hours. Hgb A1c No results for input(s): HGBA1C in the last 72 hours. Lipid Profile No results for input(s): CHOL, HDL, LDLCALC, TRIG, CHOLHDL, LDLDIRECT in the last 72 hours. Thyroid function studies No results for input(s): TSH, T4TOTAL, T3FREE, THYROIDAB in the last 72 hours.  Invalid input(s): FREET3 Anemia  work up No results for input(s): VITAMINB12, FOLATE, FERRITIN, TIBC, IRON, RETICCTPCT in the last 72 hours. Urinalysis    Component Value Date/Time   COLORURINE YELLOW 08/12/2018 1929   APPEARANCEUR CLEAR 08/12/2018 1929   LABSPEC 1.018 08/12/2018 1929   PHURINE 6.0 08/12/2018 1929   GLUCOSEU NEGATIVE 08/12/2018 1929   HGBUR NEGATIVE 08/12/2018 1929   BILIRUBINUR NEGATIVE 08/12/2018 1929   KETONESUR NEGATIVE  08/12/2018 1929   PROTEINUR NEGATIVE 08/12/2018 1929   NITRITE NEGATIVE 08/12/2018 1929   LEUKOCYTESUR NEGATIVE 08/12/2018 1929   Sepsis Labs Invalid input(s): PROCALCITONIN,  WBC,  LACTICIDVEN Microbiology No results found for this or any previous visit (from the past 240 hour(s)).   Time coordinating discharge: 35 minutes  SIGNED:   Glade Lloyd, MD  [facility] 08/14/2018, 2:05 PM Pager: [phone]  If 7PM-7AM, please contact night-coverage [email or web address] Password [email or web address]

## 2018-08-15 LAB — MITOCHONDRIAL ANTIBODIES: Mitochondrial M2 Ab, IgG: <20 Units (ref 0.0–20.0)

## 2018-08-15 MED ORDER — FAMOTIDINE 20 MG PO TABS: 20.0000 mg | ORAL_TABLET | Freq: Two times a day (BID) | ORAL | Status: DC

## 2018-08-15 NOTE — Progress Notes (Signed)
PHARMACIST - PHYSICIAN COMMUNICATION  DR:  Hanley BenAlekh  CONCERNING: IV to Oral Route Change Policy  RECOMMENDATION: This patient is receiving Pepcid by the intravenous route.  Based on criteria approved by the Pharmacy and Therapeutics Committee, the intravenous medication(s) is/are being converted to the equivalent oral dose form(s).   DESCRIPTION: These criteria include:  The patient is eating (either orally or via tube) and/or has been taking other orally administered medications for a least 24 hours  The patient has no evidence of active gastrointestinal bleeding or impaired GI absorption (gastrectomy, short bowel, patient on TNA or NPO).  If you have questions about this conversion, please contact the Pharmacy Department  []   [phone]( [phone] )  [facility] []   [phone]( [phone] )  [facility] []   [phone]( [phone]  [facility] []   [phone]( [phone] )  [facility] [x]   [phone]( [phone] )  [facility]   Shirley Murphy, Shirley Murphy, [facility] Center[facility]PH 08/15/2018 7:36 AM

## 2018-08-15 NOTE — Progress Notes (Signed)
Patient ID: Shirley IonMinnie Paff, female   DOB: [birth year], 32 y.o.   MRN: [id number] Patient was supposed to be discharged on 08/14/2018 but complained of nausea and did not feel well after oral intake.  Subsequently, her symptoms have improved.  She is now tolerating diet.  No vomiting.  Patient seen and examined at bedside.  She will be discharged home today.  Please refer to the discharge summary done by me on 08/14/2018.

## 2018-09-17 ENCOUNTER — Emergency Department (HOSPITAL_COMMUNITY)
Admission: EM | Admit: 2018-09-17 | Discharge: 2018-09-18 | Disposition: A | Discharge status: Home / Self Care | Payer: Self-pay | Attending: Emergency Medicine | Admitting: Emergency Medicine

## 2018-09-17 ENCOUNTER — Encounter (HOSPITAL_COMMUNITY): Payer: Self-pay | Admitting: Emergency Medicine

## 2018-09-17 ENCOUNTER — Other Ambulatory Visit: Payer: Self-pay

## 2018-09-17 DIAGNOSIS — F329 Major depressive disorder, single episode, unspecified: Secondary | ICD-10-CM | POA: Insufficient documentation

## 2018-09-17 DIAGNOSIS — Z79899 Other long term (current) drug therapy: Secondary | ICD-10-CM | POA: Insufficient documentation

## 2018-09-17 DIAGNOSIS — I10 Essential (primary) hypertension: Secondary | ICD-10-CM | POA: Insufficient documentation

## 2018-09-17 DIAGNOSIS — F1123 Opioid dependence with withdrawal: Secondary | ICD-10-CM | POA: Insufficient documentation

## 2018-09-17 DIAGNOSIS — R45851 Suicidal ideations: Secondary | ICD-10-CM | POA: Insufficient documentation

## 2018-09-17 DIAGNOSIS — F10239 Alcohol dependence with withdrawal, unspecified: Secondary | ICD-10-CM | POA: Insufficient documentation

## 2018-09-17 DIAGNOSIS — F10230 Alcohol dependence with withdrawal, uncomplicated: Secondary | ICD-10-CM

## 2018-09-17 DIAGNOSIS — F10930 Alcohol use, unspecified with withdrawal, uncomplicated: Secondary | ICD-10-CM

## 2018-09-17 DIAGNOSIS — F17210 Nicotine dependence, cigarettes, uncomplicated: Secondary | ICD-10-CM | POA: Insufficient documentation

## 2018-09-17 DIAGNOSIS — F1193 Opioid use, unspecified with withdrawal: Secondary | ICD-10-CM

## 2018-09-17 LAB — CBC WITH DIFFERENTIAL/PLATELET
Abs Immature Granulocytes: 0.01 10*3/uL (ref 0.00–0.07)
BASOS ABS: 0 10*3/uL (ref 0.0–0.1)
Basophils Relative: 0 %
Eosinophils Absolute: 0.1 10*3/uL (ref 0.0–0.5)
Eosinophils Relative: 2 %
HEMATOCRIT: 31.6 % — AB (ref 36.0–46.0)
Hemoglobin: 8.7 g/dL — ABNORMAL LOW (ref 12.0–15.0)
IMMATURE GRANULOCYTES: 0 %
LYMPHS ABS: 2 10*3/uL (ref 0.7–4.0)
Lymphocytes Relative: 38 %
MCH: 20.9 pg — ABNORMAL LOW (ref 26.0–34.0)
MCHC: 27.5 g/dL — ABNORMAL LOW (ref 30.0–36.0)
MCV: 76 fL — AB (ref 80.0–100.0)
Monocytes Absolute: 0.4 10*3/uL (ref 0.1–1.0)
Monocytes Relative: 8 %
Neutro Abs: 2.6 10*3/uL (ref 1.7–7.7)
Neutrophils Relative %: 52 %
Platelets: 245 10*3/uL (ref 150–400)
RBC: 4.16 MIL/uL (ref 3.87–5.11)
RDW: 15.7 % — ABNORMAL HIGH (ref 11.5–15.5)
WBC: 5.2 10*3/uL (ref 4.0–10.5)
nRBC: 0 % (ref 0.0–0.2)

## 2018-09-17 LAB — URINALYSIS, ROUTINE W REFLEX MICROSCOPIC
BILIRUBIN URINE: NEGATIVE
Glucose, UA: NEGATIVE mg/dL
Ketones, ur: 5 mg/dL — AB
Leukocytes, UA: NEGATIVE
Nitrite: NEGATIVE
Protein, ur: NEGATIVE mg/dL
Specific Gravity, Urine: 1.031 — ABNORMAL HIGH (ref 1.005–1.030)
pH: 5 (ref 5.0–8.0)

## 2018-09-17 LAB — COMPREHENSIVE METABOLIC PANEL
ALT: 79 U/L — ABNORMAL HIGH (ref 0–44)
AST: 29 U/L (ref 15–41)
Albumin: 3.2 g/dL — ABNORMAL LOW (ref 3.5–5.0)
Alkaline Phosphatase: 50 U/L (ref 38–126)
Anion gap: 8 (ref 5–15)
BUN: 14 mg/dL (ref 6–20)
CALCIUM: 8.9 mg/dL (ref 8.9–10.3)
CO2: 25 mmol/L (ref 22–32)
Chloride: 107 mmol/L (ref 98–111)
Creatinine, Ser: 0.83 mg/dL (ref 0.44–1.00)
GFR calc Af Amer: >60 mL/min (ref >60)
GFR calc non Af Amer: >60 mL/min (ref >60)
Glucose, Bld: 110 mg/dL — ABNORMAL HIGH (ref 70–99)
Potassium: 4 mmol/L (ref 3.5–5.1)
Sodium: 140 mmol/L (ref 135–145)
Total Bilirubin: 0.4 mg/dL (ref 0.3–1.2)
Total Protein: 6.5 g/dL (ref 6.5–8.1)

## 2018-09-17 LAB — ACETAMINOPHEN LEVEL: Acetaminophen (Tylenol), Serum: <10 ug/mL — ABNORMAL LOW (ref 10–30)

## 2018-09-17 LAB — RAPID URINE DRUG SCREEN, HOSP PERFORMED
Amphetamines: NOT DETECTED
Barbiturates: NOT DETECTED
Benzodiazepines: NOT DETECTED
Cocaine: POSITIVE — AB
Opiates: NOT DETECTED
Tetrahydrocannabinol: NOT DETECTED

## 2018-09-17 LAB — I-STAT BETA HCG BLOOD, ED (MC, WL, AP ONLY): I-stat hCG, quantitative: <5 m[IU]/mL (ref <5)

## 2018-09-17 LAB — SALICYLATE LEVEL: Salicylate Lvl: <7 mg/dL (ref 2.8–30.0)

## 2018-09-17 LAB — ETHANOL: Alcohol, Ethyl (B): <10 mg/dL (ref <10)

## 2018-09-17 MED ORDER — ACETAMINOPHEN 325 MG PO TABS
650.0000 mg | ORAL_TABLET | Freq: Four times a day (QID) | ORAL | Status: DC | PRN
  Administered 2018-09-17: 650 mg via ORAL
  Filled 2018-09-17: qty 2

## 2018-09-17 MED ORDER — LORAZEPAM 1 MG PO TABS
0.0000 mg | ORAL_TABLET | Freq: Four times a day (QID) | ORAL | Status: DC
  Administered 2018-09-17: 1 mg via ORAL
  Administered 2018-09-17: 2 mg via ORAL
  Filled 2018-09-17: qty 2
  Filled 2018-09-17: qty 1

## 2018-09-17 MED ORDER — ONDANSETRON 4 MG PO TBDP
4.0000 mg | ORAL_TABLET | Freq: Three times a day (TID) | ORAL | Status: DC | PRN
  Administered 2018-09-17: 4 mg via ORAL
  Filled 2018-09-17: qty 1

## 2018-09-17 MED ORDER — VITAMIN B-1 100 MG PO TABS
100.0000 mg | ORAL_TABLET | Freq: Every day | ORAL | Status: DC
  Administered 2018-09-17: 100 mg via ORAL
  Filled 2018-09-17: qty 1

## 2018-09-17 MED ORDER — LORAZEPAM 2 MG/ML IJ SOLN: 0.0000 mg | Freq: Four times a day (QID) | INTRAMUSCULAR | Status: DC

## 2018-09-17 MED ORDER — THIAMINE HCL 100 MG/ML IJ SOLN: 100.0000 mg | Freq: Every day | INTRAMUSCULAR | Status: DC

## 2018-09-17 MED ORDER — LORAZEPAM 1 MG PO TABS: 0.0000 mg | ORAL_TABLET | Freq: Two times a day (BID) | ORAL | Status: DC

## 2018-09-17 MED ORDER — LORAZEPAM 2 MG/ML IJ SOLN: 0.0000 mg | Freq: Two times a day (BID) | INTRAMUSCULAR | Status: DC

## 2018-09-17 NOTE — Progress Notes (Signed)
Lorretta has been accepted for admission to [facility], room 307-1 under the services of Dr. Jola Babinski. Report can be called to [phone]. Yocelyne can arrive via [place] as soon as possible.

## 2018-09-17 NOTE — ED Triage Notes (Signed)
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 Pt states she also drinks a 12 pack of beer a day. Pt said she has not drank alcohol today, her last alcoholic beverage was ;last night at 9pm. Pt also said she has not used any drugs today. Pt states when she tries to detox she becomes suicidal. Pt states she does not have a set plan but said she would either overdose on pills or heroin to kill herself. Pt stated she got a new job, she wants a new beginning and wants to go to an [employer]. Pt said her AA supporters brought her to the hospital for help. After pt was sober for 3 months she moved in with her sister who also uses drugs and her relapse started. Pt tearful during triage.

## 2018-09-17 NOTE — ED Notes (Signed)
Pt's belongings inventoried by NT and placed in locker 2, valuables with security.

## 2018-09-17 NOTE — ED Notes (Signed)
ED Provider at bedside. 

## 2018-09-17 NOTE — ED Provider Notes (Signed)
[facility] EMERGENCY DEPARTMENT Provider Note   CSN: [id number] Arrival date & time: 09/17/18  1519     History   Chief Complaint No chief complaint on file.   HPI Shirley Murphy is a 33 y.o. female.  HPI   Shirley Murphy is a 33 y.o. female with PMH of chest pain, gallstones, HCV, hypertension, irregular heartbeat who presents with suicidal ideation without a plan and requesting detox from multiple substances.  She reports that she has been clean for about 3 months but about 2 to 3 months ago she moved back in with her sister and recently started using drugs again.  Has used IV and subcutaneous cocaine and heroin for the first time about 2 to 3 months ago and has used almost every day since that time.  Last used cocaine yesterday.  Drinks about 12 beers per day and last had alcohol at 9 PM last night.  Has a mild tremor at this time.  Has a history of withdrawal seizures from alcohol in the past.  She reports congestion and rhinorrhea with myalgias arthralgias and feeling intermittently mild flushed and mildly diaphoretic at times with some nausea.  Feels that she might be going through opiate withdrawal.  No objective fevers at home and no recent falls or trauma.  No emesis.  Eating and drinking without difficulty.  No urinary symptoms.  Would like to be reevaluated and treated for withdrawal in place.  Past Medical History:  Diagnosis Date  . Chest pain   . Gallstones   . Hepatitis C   . Hypertension   . Irregular heart beat     Patient Active Problem List   Diagnosis Date Noted  . Acute hepatitis 08/13/2018  . Nausea & vomiting 08/13/2018  . Overdose 02/28/2018  . Drug overdose 02/28/2018  . Severe recurrent major depression without psychotic features (HCC) 02/24/2018  . Chronic hepatitis C without hepatic coma (HCC) 02/23/2018  . Alcohol dependence with alcohol-induced mood disorder (HCC) 02/02/2018  . MDD (major depressive disorder), recurrent  severe, without psychosis (HCC) 02/01/2018  . Moderate cocaine use disorder (HCC) 06/12/2017  . Alcohol abuse with alcohol-induced mood disorder (HCC) 06/12/2017  . Substance induced mood disorder (HCC) 03/04/2017  . MDD (major depressive disorder), recurrent episode (HCC) 03/02/2017  . Suicide attempt (HCC) 10/15/2016  . HTN (hypertension), benign 10/15/2016  . Irregular heart beat 10/15/2016  . Suicidal ideation 10/15/2016  . Alcoholic intoxication without complication [facility])     Past Surgical History:  Procedure Laterality Date  . c sections    . TUBAL LIGATION       OB History   No obstetric history on file.      Home Medications    Prior to Admission medications   Medication Sig Start Date End Date Taking? Authorizing Provider  alum & mag hydroxide-simeth (MAALOX/MYLANTA) 200-200-20 MG/5ML suspension Take 15 mLs by mouth every 6 (six) hours as needed for indigestion or heartburn. 08/14/18  Yes Glade Lloyd, MD  Biotin 2500 MCG CAPS Take 1 capsule by mouth daily.   Yes [provider]  ibuprofen (ADVIL,MOTRIN) 200 MG tablet Take 800 mg by mouth daily as needed for moderate pain.   Yes [provider]  Multiple Vitamins-Minerals (MULTIVITAMIN WITH MINERALS) tablet Take 1 tablet by mouth daily.   Yes [provider]  ARIPiprazole (ABILIFY) 5 MG tablet Take 1 tablet (5 mg total) by mouth daily. Patient not taking: Reported on 09/17/2018 03/04/18   Rhetta Mura, MD  famotidine (  PEPCID) 20 MG tablet Take 1 tablet (20 mg total) by mouth 2 (two) times daily. 08/14/18 09/13/18  Glade LloydAlekh, Kshitiz, MD  gabapentin (NEURONTIN) 300 MG capsule Take 1 capsule (300 mg total) by mouth 4 (four) times daily. For withdrawal symptoms Patient not taking: Reported on 09/17/2018 03/04/18   Rhetta MuraSamtani, Jai-Gurmukh, MD  levothyroxine (SYNTHROID, LEVOTHROID) 25 MCG tablet Take 1 tablet (25 mcg total) by mouth daily before breakfast. Patient not taking: Reported on 09/17/2018  03/04/18   Rhetta MuraSamtani, Jai-Gurmukh, MD  nicotine (NICODERM CQ - DOSED IN MG/24 HOURS) 21 mg/24hr patch Place 1 patch (21 mg total) onto the skin daily. Patient not taking: Reported on 09/17/2018 03/05/18   Rhetta MuraSamtani, Jai-Gurmukh, MD  ondansetron (ZOFRAN) 4 MG tablet Take 1 tablet (4 mg total) by mouth every 6 (six) hours as needed for nausea. Patient not taking: Reported on 09/17/2018 08/14/18   Glade LloydAlekh, Kshitiz, MD  prazosin (MINIPRESS) 1 MG capsule Take 1 capsule (1 mg total) by mouth at bedtime. Patient not taking: Reported on 09/17/2018 03/04/18   Rhetta MuraSamtani, Jai-Gurmukh, MD  sertraline (ZOLOFT) 50 MG tablet Take 1 tablet (50 mg total) by mouth daily. Patient not taking: Reported on 09/17/2018 03/04/18   Rhetta MuraSamtani, Jai-Gurmukh, MD  traZODone (DESYREL) 100 MG tablet Take 1 tablet (100 mg total) by mouth at bedtime as needed for sleep. Patient not taking: Reported on 09/17/2018 03/04/18   Rhetta MuraSamtani, Jai-Gurmukh, MD    Family History Family History  Problem Relation Age of Onset  . Hypertension Father   . Pancreatic cancer Father     Social History Social History   Tobacco Use  . Smoking status: Current Every Day Smoker    Packs/day: 1.00    Types: Cigarettes  . Smokeless tobacco: Never Used  Substance Use Topics  . Alcohol use: Not Currently  . Drug use: Yes    Types: Cocaine, Marijuana    Comment: Used cocaine 2 days ago     Allergies   Other   Review of Systems Review of Systems  Constitutional: Positive for diaphoresis (mild). Negative for chills and fever.  HENT: Positive for congestion, postnasal drip and rhinorrhea. Negative for ear pain and sore throat.   Eyes: Negative for pain and visual disturbance.  Respiratory: Negative for cough and shortness of breath.   Cardiovascular: Negative for chest pain and palpitations.  Gastrointestinal: Positive for nausea. Negative for abdominal pain, blood in stool, constipation, diarrhea and vomiting.  Genitourinary: Negative for difficulty  urinating, dysuria, flank pain and hematuria.  Musculoskeletal: Positive for arthralgias and myalgias. Negative for back pain.  Skin: Negative for color change and rash.  Neurological: Positive for tremors and headaches. Negative for seizures and syncope.  Psychiatric/Behavioral: Positive for dysphoric mood and suicidal ideas. Negative for sleep disturbance. The patient is nervous/anxious.   All other systems reviewed and are negative.    Physical Exam Updated Vital Signs BP (!) 149/78 (BP Location: Right Arm)   Pulse 94   Temp 98 F (36.7 C) (Oral)   Resp 19   SpO2 98%   Physical Exam Vitals signs and nursing note reviewed.  Constitutional:      General: She is not in acute distress.    Appearance: She is well-developed.  HENT:     Head: Normocephalic and atraumatic.  Eyes:     Conjunctiva/sclera: Conjunctivae normal.  Neck:     Musculoskeletal: Neck supple.  Cardiovascular:     Rate and Rhythm: Normal rate and regular rhythm.     Heart sounds: No  murmur.  Pulmonary:     Effort: Pulmonary effort is normal. No respiratory distress.     Breath sounds: Normal breath sounds.  Abdominal:     Palpations: Abdomen is soft.     Tenderness: There is no abdominal tenderness.  Skin:    General: Skin is warm and dry.  Neurological:     General: No focal deficit present.     Mental Status: She is alert and oriented to person, place, and time.     GCS: GCS eye subscore is 4. GCS verbal subscore is 5. GCS motor subscore is 6.     Cranial Nerves: Cranial nerves are intact.     Sensory: Sensation is intact.     Motor: Tremor present.     Coordination: Coordination is intact.  Psychiatric:        Attention and Perception: Attention normal.        Mood and Affect: Mood is anxious.        Speech: Speech normal.        Behavior: Behavior normal.        Thought Content: Thought content includes suicidal ideation. Thought content does not include homicidal ideation. Thought content does  not include homicidal or suicidal plan.      ED Treatments / Results  Labs (all labs ordered are listed, but only abnormal results are displayed) Labs Reviewed  COMPREHENSIVE METABOLIC PANEL - Abnormal; Notable for the following components:      Result Value   Glucose, Bld 110 (*)    Albumin 3.2 (*)    ALT 79 (*)    All other components within normal limits  RAPID URINE DRUG SCREEN, HOSP PERFORMED - Abnormal; Notable for the following components:   Cocaine POSITIVE (*)    All other components within normal limits  CBC WITH DIFFERENTIAL/PLATELET - Abnormal; Notable for the following components:   Hemoglobin 8.7 (*)    HCT 31.6 (*)    MCV 76.0 (*)    MCH 20.9 (*)    MCHC 27.5 (*)    RDW 15.7 (*)    All other components within normal limits  ACETAMINOPHEN LEVEL - Abnormal; Notable for the following components:   Acetaminophen (Tylenol), Serum <10 (*)    All other components within normal limits  URINALYSIS, ROUTINE W REFLEX MICROSCOPIC - Abnormal; Notable for the following components:   APPearance HAZY (*)    Specific Gravity, Urine 1.031 (*)    Hgb urine dipstick SMALL (*)    Ketones, ur 5 (*)    Bacteria, UA FEW (*)    All other components within normal limits  ETHANOL  SALICYLATE LEVEL  I-STAT BETA HCG BLOOD, ED ([facility], [facility], [facility] ONLY)  I-STAT BETA HCG BLOOD, ED ([facility], [facility], [facility] ONLY)    EKG None  Radiology No results found.  Procedures Procedures (including critical care time)  Medications Ordered in ED Medications  LORazepam (ATIVAN) injection 0-4 mg ( Intravenous See Alternative 09/17/18 2148)    Or  LORazepam (ATIVAN) tablet 0-4 mg (2 mg Oral Given 09/17/18 2148)  LORazepam (ATIVAN) injection 0-4 mg (has no administration in time range)    Or  LORazepam (ATIVAN) tablet 0-4 mg (has no administration in time range)  thiamine (VITAMIN B-1) tablet 100 mg (100 mg Oral Given 09/17/18 1611)    Or  thiamine (B-1) injection 100 mg ( Intravenous See Alternative 09/17/18 1611)    ondansetron (ZOFRAN-ODT) disintegrating tablet 4 mg (4 mg Oral Given 09/17/18 2100)  acetaminophen (TYLENOL) tablet  650 mg (650 mg Oral Given 09/17/18 2148)     Initial Impression / Assessment and Plan / ED Course  I have reviewed the triage vital signs and the nursing notes.  Pertinent labs & imaging results that were available during my care of the patient were reviewed by me and considered in my medical decision making (see chart for details).     MDM:  Imaging: None indicated at this time.    ED Provider Interpretation of EKG: None indicated at this time.    Labs: UA with few bacteria otherwise normal, cocaine positive on UDS otherwise negative, Tylenol negative, salicylate negative, CBC with a hemoglobin of 8.7 which is near her baseline otherwise unremarkable, ethanol negative, CMP unremarkable  with exception of mild elevation of ALT at 79, hCG negative  On initial evaluation, patient appears intermittently tearful but smiling. Afebrile and hemodynamically stable. Alert and oriented x4, pleasant, and cooperative.  Presents with passive suicidal ideation without a plan in the setting of withdrawal and detox as detailed above.  She is optimistic about her prospects.  On exam, patient has no focal abnormality.  Mildly labile in her mood but largely happy and optimistic at the prospect of getting clean again.  She does endorse passive suicidal ideation without a plan at this time.  Does appear to have evidence for opiate withdrawal and alcohol withdrawal with a mild tremor present at this time but no other neurologic deficits.  No evidence at this time for alcohol hallucinosis or delirium tremens.  She wants to stay in the ED and continue to be evaluated for possible placement for detoxification and further treatment.  She was placed on alcohol withdrawal/CIWA protocol in the ED.  Placed on opiate withdrawal regimen with clonidine.  Remained vitally stable.  Labs unremarkable with exception  of acute on chronic anemia.  Significant change from baseline.  Medically clear.  TTS consulted and evaluated patient at the bedside.  They recommended admission and [facility]  Awaiting transfer to inpatient bed at time of signout.  See their notes for additional details.   The plan for this patient was discussed with Dr. Ethelda ChickJacubowitz who voiced agreement and who oversaw evaluation and treatment of this patient.   The patient was fully informed and involved with the history taking, evaluation, workup including labs/images, and plan. The patient's concerns and questions were addressed to the patient's satisfaction and she expressed agreement with the plan.    Final Clinical Impressions(s) / ED Diagnoses   Final diagnoses:  Opiate withdrawal (HCC)  Alcohol withdrawal syndrome without complication (HCC)  Passive suicidal ideations    ED Discharge Orders    None       Berish Bohman, Sherryle LisJames F II, MD 09/18/18 [id number]    Doug SouJacubowitz, Sam, MD 09/18/18 [phone]

## 2018-09-17 NOTE — ED Notes (Signed)
[facility] called for transport

## 2018-09-17 NOTE — Progress Notes (Signed)
Pt meets inpatient criteria per Reola Calkins, NP. Referral information has been sent to the following hospitals for review:  [facility]  [facility]  [facility]  [facility]  [facility]  [facility]  [facility]  [facility]   Disposition will continue to assist with inpatient placement needs.   Wells Guiles, LCSW, LCAS Disposition CSW [facility]/TTS [phone]

## 2018-09-17 NOTE — ED Notes (Signed)
wanded by security 

## 2018-09-17 NOTE — ED Notes (Signed)
Pt has nicotine patch on left arm that she placed today at 1545

## 2018-09-17 NOTE — ED Provider Notes (Signed)
She reports she last used alcohol, heroin and cocaine last night.  Complains of feeling sweaty presently she reports feeling suicidal earlier today.  She is presently alert Glasgow Coma Score 15.  Non-tremulous.  Answers questions appropriately.   Doug Sou, MD 09/17/18 2010

## 2018-09-17 NOTE — ED Notes (Signed)
Pt [facility], very nausea.  Spoke to provider, ordered Zofran, given.
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
## 2018-09-18 ENCOUNTER — Inpatient Hospital Stay (HOSPITAL_COMMUNITY)
Admission: AD | Admit: 2018-09-18 | Discharge: 2018-09-22 | DRG: 885 | Disposition: A | Discharge status: Home / Self Care | Payer: Federal, State, Local not specified - Other | Source: Intra-hospital | Attending: Psychiatry | Admitting: Psychiatry

## 2018-09-18 ENCOUNTER — Encounter (HOSPITAL_COMMUNITY): Payer: Self-pay

## 2018-09-18 ENCOUNTER — Other Ambulatory Visit: Payer: Self-pay

## 2018-09-18 DIAGNOSIS — Z915 Personal history of self-harm: Secondary | ICD-10-CM

## 2018-09-18 DIAGNOSIS — F4310 Post-traumatic stress disorder, unspecified: Secondary | ICD-10-CM | POA: Diagnosis present

## 2018-09-18 DIAGNOSIS — F17210 Nicotine dependence, cigarettes, uncomplicated: Secondary | ICD-10-CM | POA: Diagnosis present

## 2018-09-18 DIAGNOSIS — B182 Chronic viral hepatitis C: Secondary | ICD-10-CM | POA: Diagnosis present

## 2018-09-18 DIAGNOSIS — F332 Major depressive disorder, recurrent severe without psychotic features: Secondary | ICD-10-CM | POA: Diagnosis not present

## 2018-09-18 DIAGNOSIS — Z811 Family history of alcohol abuse and dependence: Secondary | ICD-10-CM

## 2018-09-18 DIAGNOSIS — Z79890 Hormone replacement therapy (postmenopausal): Secondary | ICD-10-CM

## 2018-09-18 DIAGNOSIS — Z888 Allergy status to other drugs, medicaments and biological substances status: Secondary | ICD-10-CM

## 2018-09-18 DIAGNOSIS — Z818 Family history of other mental and behavioral disorders: Secondary | ICD-10-CM

## 2018-09-18 DIAGNOSIS — R45851 Suicidal ideations: Secondary | ICD-10-CM | POA: Diagnosis present

## 2018-09-18 DIAGNOSIS — I10 Essential (primary) hypertension: Secondary | ICD-10-CM | POA: Diagnosis present

## 2018-09-18 DIAGNOSIS — G4700 Insomnia, unspecified: Secondary | ICD-10-CM | POA: Diagnosis present

## 2018-09-18 DIAGNOSIS — Z8249 Family history of ischemic heart disease and other diseases of the circulatory system: Secondary | ICD-10-CM | POA: Diagnosis not present

## 2018-09-18 DIAGNOSIS — Z800 Family history of malignant neoplasm of digestive organs: Secondary | ICD-10-CM

## 2018-09-18 HISTORY — DX: Major depressive disorder, single episode, unspecified: F32.9

## 2018-09-18 HISTORY — DX: Anxiety disorder, unspecified: F41.9

## 2018-09-18 HISTORY — DX: Depression, unspecified: F32.A

## 2018-09-18 MED ORDER — LORAZEPAM 1 MG PO TABS
1.0000 mg | ORAL_TABLET | Freq: Four times a day (QID) | ORAL | Status: DC | PRN
  Administered 2018-09-19 – 2018-09-20 (×3): 1 mg via ORAL
  Filled 2018-09-18 (×3): qty 1

## 2018-09-18 MED ORDER — MAGNESIUM HYDROXIDE 400 MG/5ML PO SUSP
30.0000 mL | Freq: Every day | ORAL | Status: DC | PRN
  Administered 2018-09-19 – 2018-09-20 (×2): 30 mL via ORAL
  Filled 2018-09-18 (×2): qty 30

## 2018-09-18 MED ORDER — METHOCARBAMOL 500 MG PO TABS
500.0000 mg | ORAL_TABLET | Freq: Three times a day (TID) | ORAL | Status: DC | PRN
  Administered 2018-09-18 – 2018-09-20 (×4): 500 mg via ORAL
  Filled 2018-09-18 (×4): qty 1

## 2018-09-18 MED ORDER — ALUM & MAG HYDROXIDE-SIMETH 200-200-20 MG/5ML PO SUSP: 30.0000 mL | ORAL | Status: DC | PRN

## 2018-09-18 MED ORDER — LORAZEPAM 1 MG PO TABS: 1.0000 mg | ORAL_TABLET | Freq: Three times a day (TID) | ORAL | Status: DC

## 2018-09-18 MED ORDER — LORAZEPAM 1 MG PO TABS
1.0000 mg | ORAL_TABLET | Freq: Four times a day (QID) | ORAL | Status: DC
  Administered 2018-09-18 (×2): 1 mg via ORAL
  Filled 2018-09-18 (×2): qty 1

## 2018-09-18 MED ORDER — NAPROXEN 500 MG PO TABS
500.0000 mg | ORAL_TABLET | Freq: Two times a day (BID) | ORAL | Status: DC | PRN
  Administered 2018-09-18 – 2018-09-21 (×7): 500 mg via ORAL
  Filled 2018-09-18 (×5): qty 1
  Filled 2018-09-18: qty 14
  Filled 2018-09-18 (×2): qty 1

## 2018-09-18 MED ORDER — ADULT MULTIVITAMIN W/MINERALS CH
1.0000 | ORAL_TABLET | Freq: Every day | ORAL | Status: DC
  Administered 2018-09-18 – 2018-09-22 (×5): 1 via ORAL
  Filled 2018-09-18 (×8): qty 1

## 2018-09-18 MED ORDER — LORAZEPAM 1 MG PO TABS: 1.0000 mg | ORAL_TABLET | Freq: Every day | ORAL | Status: DC

## 2018-09-18 MED ORDER — DICYCLOMINE HCL 20 MG PO TABS
20.0000 mg | ORAL_TABLET | Freq: Four times a day (QID) | ORAL | Status: DC | PRN
  Administered 2018-09-19 – 2018-09-21 (×3): 20 mg via ORAL
  Filled 2018-09-18 (×3): qty 1

## 2018-09-18 MED ORDER — SERTRALINE HCL 25 MG PO TABS
25.0000 mg | ORAL_TABLET | Freq: Every day | ORAL | Status: DC
  Administered 2018-09-18 – 2018-09-22 (×5): 25 mg via ORAL
  Filled 2018-09-18 (×8): qty 1

## 2018-09-18 MED ORDER — FOLIC ACID 1 MG PO TABS
1.0000 mg | ORAL_TABLET | Freq: Every day | ORAL | Status: DC
  Administered 2018-09-18 – 2018-09-22 (×5): 1 mg via ORAL
  Filled 2018-09-18 (×8): qty 1

## 2018-09-18 MED ORDER — TRAZODONE HCL 50 MG PO TABS
50.0000 mg | ORAL_TABLET | Freq: Every evening | ORAL | Status: DC | PRN
  Administered 2018-09-18 (×2): 50 mg via ORAL
  Filled 2018-09-18: qty 1

## 2018-09-18 MED ORDER — LOPERAMIDE HCL 2 MG PO CAPS: 2.0000 mg | ORAL_CAPSULE | ORAL | Status: AC | PRN

## 2018-09-18 MED ORDER — VITAMIN B-1 100 MG PO TABS
100.0000 mg | ORAL_TABLET | Freq: Every day | ORAL | Status: DC
  Administered 2018-09-19 – 2018-09-22 (×4): 100 mg via ORAL
  Filled 2018-09-18 (×6): qty 1

## 2018-09-18 MED ORDER — TRAZODONE HCL 50 MG PO TABS
50.0000 mg | ORAL_TABLET | Freq: Every evening | ORAL | Status: DC | PRN
  Administered 2018-09-18 – 2018-09-21 (×4): 50 mg via ORAL
  Filled 2018-09-18: qty 1
  Filled 2018-09-18: qty 14
  Filled 2018-09-18 (×2): qty 1

## 2018-09-18 MED ORDER — ACETAMINOPHEN 325 MG PO TABS: 650.0000 mg | ORAL_TABLET | Freq: Four times a day (QID) | ORAL | Status: DC | PRN

## 2018-09-18 MED ORDER — LORAZEPAM 1 MG PO TABS: 1.0000 mg | ORAL_TABLET | Freq: Two times a day (BID) | ORAL | Status: DC

## 2018-09-18 MED ORDER — ONDANSETRON 4 MG PO TBDP
4.0000 mg | ORAL_TABLET | Freq: Four times a day (QID) | ORAL | Status: DC | PRN
  Administered 2018-09-18 – 2018-09-20 (×3): 4 mg via ORAL
  Filled 2018-09-18 (×3): qty 1

## 2018-09-18 MED ORDER — HYDROXYZINE HCL 25 MG PO TABS
25.0000 mg | ORAL_TABLET | Freq: Four times a day (QID) | ORAL | Status: AC | PRN
  Administered 2018-09-18 – 2018-09-20 (×5): 25 mg via ORAL
  Filled 2018-09-18 (×5): qty 1

## 2018-09-18 MED ORDER — NICOTINE 21 MG/24HR TD PT24
21.0000 mg | MEDICATED_PATCH | Freq: Every day | TRANSDERMAL | Status: DC
  Administered 2018-09-18 – 2018-09-21 (×4): 21 mg via TRANSDERMAL
  Filled 2018-09-18 (×9): qty 1
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
## 2018-09-18 NOTE — Plan of Care (Signed)
  Problem: Activity: Goal: Interest or engagement in activities will improve Outcome: Not Progressing   Problem: Coping: Goal: Ability to verbalize frustrations and anger appropriately will improve Outcome: Progressing   D: Pt alert and oriented on the unit. Pt denies SI/HI, A/VH. Pt was isolated to her room for most of the day but did not attend afternoon groups and unit activities. Pt is pleasant and cooperative. A: Education, support and encouragement provided, q15 minute safety checks remain in effect. Medications administered per MD orders. R: No reactions/side effects to medicine noted. Pt denies any concerns at this time, and verbally contracts for safety. Pt ambulating on the unit with no issues. Pt remains safe on and off the unit.

## 2018-09-18 NOTE — B[facility] Group Notes (Signed)
B[facility] Group Notes: (Clinical Social Work)   09/18/2018      Type of Therapy:  Group Therapy   Participation Level:  Did Not Attend despite MHT prompting   Ambrose MantleMareida Grossman-Orr, LCSW 09/18/2018, 12:18 PM

## 2018-09-18 NOTE — Progress Notes (Signed)
Patient did not attend the evening speaker AA meeting. Pt entered group but left before fifteen minutes and returned to her room.

## 2018-09-18 NOTE — Tx Team (Signed)
Initial Treatment Plan 09/18/2018 1:59 AM Shirley Murphy HXT:[id number]    PATIENT STRESSORS: Financial difficulties Medication change or noncompliance Substance abuse   PATIENT STRENGTHS: Ability for insight Average or above average intelligence Capable of independent living Communication skills Motivation for treatment/growth   PATIENT IDENTIFIED PROBLEMS:   " I have been using both cocaine and heroin IV for the past two months"  " Suicidal thoughts to overdose, I've done it 3 times in the past"   " I want to detox and go to an [employer]"               DISCHARGE CRITERIA:  Ability to meet basic life and health needs Adequate post-discharge living arrangements Reduction of life-threatening or endangering symptoms to within safe limits  PRELIMINARY DISCHARGE PLAN: Outpatient therapy Placement in alternative living arrangements  PATIENT/FAMILY INVOLVEMENT: This treatment plan has been presented to and reviewed with the patient, Shirley Murphy, and/or family member, .  The patient and family have been given the opportunity to ask questions and make suggestions.  Andres Ege, RN 09/18/2018, 1:59 AM

## 2018-09-18 NOTE — B[facility] Group Notes (Signed)
Adult Psychoeducational Group Note  Date:  09/18/2018 Time:  5:57 PM  Group Topic/Focus:  Healthy Communication:   The focus of this group is to discuss communication, barriers to communication, as well as healthy ways to communicate with others.  Participation Level:  Active  Participation Quality:  Appropriate  Affect:  Appropriate  Cognitive:  Appropriate  Insight: Appropriate  Engagement in Group:  Engaged  Modes of Intervention:  Activity, Discussion, Exploration, Rapport Building, Socialization and Support  Additional Comments:  Pt attended and participated during the group activity.  Dereon Corkery C 09/18/2018, 5:57 PM  

## 2018-09-18 NOTE — H&P (Signed)
Psychiatric Admission Assessment Adult  Patient Identification: Shirley Murphy  MRN:  [id number]  Date of Evaluation:  09/18/2018  Chief Complaint: Suicidal ideations.  Principal Diagnosis: Cocaine use disorder, dependence, Major depressive disorder. Diagnosis:   Patient Active Problem List   Diagnosis Date Noted  . Alcoholic intoxication without complication (HCC) [F10.920]     Priority: High  . Acute hepatitis [B17.9] 08/13/2018  . Nausea & vomiting [R11.2] 08/13/2018  . Overdose [T50.901A] 02/28/2018  . Drug overdose [T50.901A] 02/28/2018  . Severe recurrent major depression without psychotic features (HCC) [F33.2] 02/24/2018  . Chronic hepatitis C without hepatic coma (HCC) [B18.2] 02/23/2018  . Alcohol dependence with alcohol-induced mood disorder (HCC) [F10.24] 02/02/2018  . MDD (major depressive disorder), recurrent severe, without psychosis (HCC) [F33.2] 02/01/2018  . Moderate cocaine use disorder (HCC) [F14.20] 06/12/2017  . Alcohol abuse with alcohol-induced mood disorder (HCC) [F10.14] 06/12/2017  . Substance induced mood disorder (HCC) [F19.94] 03/04/2017  . MDD (major depressive disorder), recurrent episode (HCC) [F33.9] 03/02/2017  . Suicide attempt (HCC) [T14.91XA] 10/15/2016  . HTN (hypertension), benign [I10] 10/15/2016  . Irregular heart beat [I49.9] 10/15/2016  . Suicidal ideation [R45.851] 10/15/2016   History of Present Illness: (Per Md's admission SRA):Patient is a 33 year old female with a past psychiatric history significant for major depression, alcohol dependence, heroin dependence as well as hepatitis C who presented to the [facility] emergency department on 09/17/2018 with suicidal ideation.  The patient had [facility] psychiatric [facility] on 07/06/2018.  At that time the patient had been [place] [facility], got very depressed, wanted to drink, but used cocaine.  Her last psychiatric  hospitalization at our facility was in July 2019.  At that time she reported a history of posttraumatic stress disorder, and polysubstance use disorders.  She was discharged on Campral, Abilify, gabapentin, hydroxyzine, prazosin, levothyroxine, sertraline and trazodone.  Her dosage of sertraline when she was discharged from [facility] was 100 mg a day.  Patient stated she had been sober for 3 months and was living in a sober living house approximately 2-1/2 months ago.  She left that environment, and relapsed, and has been on heroin, cocaine, alcohol since that point.  She stated she has been using all 3 substances on a daily basis.  She is been using approximately a gram of heroin, 1.5 g of cocaine and 12 beers a day.  The patient stated she had attempted to get back into the [place] [facility] and self detox, but was unable to do so because of withdrawal symptoms.  She also claimed to have suicidal ideation with a plan to overdose.  She is had previous behavioral health hospital admissions as well as admissions to [facility] and [facility].  She has been staying with her sister who also is an addict.  She was admitted to the hospital for evaluation and stabilization.   Associated Signs/Symptoms:  Depression Symptoms:  depressed mood, insomnia, psychomotor agitation, anxiety, decreased appetite,  (Hypo) Manic Symptoms:  Does not endorse or present with at this time  Anxiety Symptoms:  Reports increased anxiety and some panic attacks  Psychotic Symptoms:  Denies   PTSD Symptoms: Reports history of PTSD stemming from witnessing a friend overdose and die in front of her (2012). Reports partial improvement overtime, but describes occasional nightmares and intrusive recollections.  Total Time spent with patient: 1 hour  Past Psychiatric History: Multiple psychiatric admissions since she was a teenager as above, had recently been  admitted to [facility] in  July 2019 for depression/substance abuse . Reports history of  suicide attempts by overdose and by trying to hang self in the past . Reports fleeting hallucinations at times during past  detoxifications from alcohol, but states no recent psychotic symptoms. History of PTSD but reports partial improvement overtime .Reports prior history of brief mood swings, mood instability, and states she has been diagnosed with Bipolar Disorder as a teenager. She states that her main issue is  long history of depression.   Is the patient at risk to self? Yes.    Has the patient been a risk to self in the past 6 months? Yes.    Has the patient been a risk to self within the distant past? Yes.    Is the patient a risk to others? No.  Has the patient been a risk to others in the past 6 months? No.  Has the patient been a risk to others within the distant past? No.   Prior Inpatient Therapy: Yes, [facility] x multiple times.  Prior Outpatient Therapy: Yes  Alcohol Screening: 1. How often do you have a drink containing alcohol?: 4 or more times a week 2. How many drinks containing alcohol do you have on a typical day when you are drinking?: 10 or more 3. How often do you have six or more drinks on one occasion?: Daily or almost daily AUDIT-C Score: 12 4. How often during the last year have you found that you were not able to stop drinking once you had started?: Weekly 5. How often during the last year have you failed to do what was normally expected from you becasue of drinking?: Weekly 6. How often during the last year have you needed a first drink in the morning to get yourself going after a heavy drinking session?: Less than monthly 7. How often during the last year have you had a feeling of guilt of remorse after drinking?: Weekly 8. How often during the last year have you been unable to remember what happened the night before because you had been drinking?: Never 9. Have you or someone else been injured as a result of your drinking?: No 10. Has a relative or friend or a doctor  or another health worker been concerned about your drinking or suggested you cut down?: Yes, during the last year Alcohol Use Disorder Identification Test Final Score (AUDIT): 26 Alcohol Brief Interventions/Follow-up: Alcohol Education  Substance Abuse History in the last 12 months: Yes,  reports history of alcohol , cocaine use disorder. Reports remote history of opiate abuse .   Consequences of Substance Abuse: Loss of relationships, financial and family distancing/strain. Reports history of alcohol WDL seizure. History of alcohol related blackout .   Previous Psychotropic Medications: Abilify, Neurontin, Minipress, Zoloft. States she tolerates these medications well, have been helpful .  Psychological Evaluations: No   Past Medical History: Reports history of HTN, Hepatitis C.    Past Medical History:  Diagnosis Date  . Anxiety   . Chest pain   . Depression   . Gallstones   . Hepatitis C   . Hypertension    patient reports only during detox  . Irregular heart beat     Past Surgical History:  Procedure Laterality Date  . c sections    . TUBAL LIGATION     Family History: Father passed away from pancreatic cancer in 2004, mother alive, has two sisters & one brother  Family Psychiatric  History: Father, sister  had history of alcohol dependence. One paternal uncle uncle committed suicide, states father attempted suicide .  Tobacco Screening:  Smokes 1 PPD   Social History: had been living alone, has three children (9,11,13) who were adopted out in the past. Was recently employed, but had lost job due to alcohol related issues.  Social History   Substance and Sexual Activity  Alcohol Use Yes  . Alcohol/week: 12.0 standard drinks  . Types: 12 Cans of beer per week   Comment: daily use recently     Social History   Substance and Sexual Activity  Drug Use Yes  . Types: Cocaine, Heroin   Comment: daily use of cocaine and heroin    Additional Social  History:  Allergies:   Allergies  Allergen Reactions  . Other Other (See Comments)    benzoyl peroxide - used topically for acne cause facial and tongue swelling   Lab Results:  Results for orders placed or performed during the hospital encounter of 09/17/18 (from the past 48 hour(s))  Urine rapid drug screen (hosp performed)     Status: Abnormal   Collection Time: 09/17/18  4:40 PM  Result Value Ref Range   Opiates NONE DETECTED NONE DETECTED   Cocaine POSITIVE (A) NONE DETECTED   Benzodiazepines NONE DETECTED NONE DETECTED   Amphetamines NONE DETECTED NONE DETECTED   Tetrahydrocannabinol NONE DETECTED NONE DETECTED   Barbiturates NONE DETECTED NONE DETECTED    Comment: (NOTE) DRUG SCREEN FOR MEDICAL PURPOSES ONLY.  IF CONFIRMATION IS NEEDED FOR ANY PURPOSE, NOTIFY LAB WITHIN 5 DAYS. LOWEST DETECTABLE LIMITS FOR URINE DRUG SCREEN Drug Class                     Cutoff (ng/mL) Amphetamine and metabolites    1000 Barbiturate and metabolites    200 Benzodiazepine                 200 Tricyclics and metabolites     300 Opiates and metabolites        300 Cocaine and metabolites        300 THC                            50 Performed at [facility] Lab, [address] [place], [place] [address]   Urinalysis, Routine w reflex microscopic     Status: Abnormal   Collection Time: 09/17/18  4:40 PM  Result Value Ref Range   Color, Urine YELLOW YELLOW   APPearance HAZY (A) CLEAR   Specific Gravity, Urine 1.031 (H) 1.005 - 1.030   pH 5.0 5.0 - 8.0   Glucose, UA NEGATIVE NEGATIVE mg/dL   Hgb urine dipstick SMALL (A) NEGATIVE   Bilirubin Urine NEGATIVE NEGATIVE   Ketones, ur 5 (A) NEGATIVE mg/dL   Protein, ur NEGATIVE NEGATIVE mg/dL   Nitrite NEGATIVE NEGATIVE   Leukocytes, UA NEGATIVE NEGATIVE   RBC / HPF 6-10 0 - 5 RBC/hpf   WBC, UA 0-5 0 - 5 WBC/hpf   Bacteria, UA FEW (A) NONE SEEN   Squamous Epithelial / LPF 0-5 0 - 5   Mucus PRESENT    Ca Oxalate Crys, UA PRESENT      Comment: Performed at [facility], [address], [place], [place] [address]  Comprehensive metabolic panel     Status: Abnormal   Collection Time: 09/17/18  4:52 PM  Result Value Ref Range   Sodium 140  135 - 145 mmol/L   Potassium 4.0 3.5 - 5.1 mmol/L   Chloride 107 98 - 111 mmol/L   CO2 25 22 - 32 mmol/L   Glucose, Bld 110 (H) 70 - 99 mg/dL   BUN 14 6 - 20 mg/dL   Creatinine, Ser 1.61 0.44 - 1.00 mg/dL   Calcium 8.9 8.9 - 09.6 mg/dL   Total Protein 6.5 6.5 - 8.1 g/dL   Albumin 3.2 (L) 3.5 - 5.0 g/dL   AST 29 15 - 41 U/L   ALT 79 (H) 0 - 44 U/L   Alkaline Phosphatase 50 38 - 126 U/L   Total Bilirubin 0.4 0.3 - 1.2 mg/dL   GFR calc non Af Amer >60 >60 mL/min   GFR calc Af Amer >60 >60 mL/min   Anion gap 8 5 - 15    Comment: Performed at [facility] Lab, [address], [place], [place] [address]  Ethanol     Status: None   Collection Time: 09/17/18  4:52 PM  Result Value Ref Range   Alcohol, Ethyl (B) <10 <10 mg/dL    Comment: (NOTE) Lowest detectable limit for serum alcohol is 10 mg/dL. For medical purposes only. Performed at [facility] Lab, [address], [place], [place] [address]   CBC with Diff     Status: Abnormal   Collection Time: 09/17/18  4:52 PM  Result Value Ref Range   WBC 5.2 4.0 - 10.5 K/uL   RBC 4.16 3.87 - 5.11 MIL/uL   Hemoglobin 8.7 (L) 12.0 - 15.0 g/dL    Comment: Reticulocyte Hemoglobin testing may be clinically indicated, consider ordering this additional test [id number]    HCT 31.6 (L) 36.0 - 46.0 %   MCV 76.0 (L) 80.0 - 100.0 fL   MCH 20.9 (L) 26.0 - 34.0 pg   MCHC 27.5 (L) 30.0 - 36.0 g/dL   RDW 21.3 (H) 08.6 - 57.8 %   Platelets 245 150 - 400 K/uL   nRBC 0.0 0.0 - 0.2 %   Neutrophils Relative % 52 %   Neutro Abs 2.6 1.7 - 7.7 K/uL   Lymphocytes Relative 38 %   Lymphs Abs 2.0 0.7 - 4.0 K/uL   Monocytes Relative 8 %   Monocytes Absolute 0.4 0.1 - 1.0 K/uL   Eosinophils Relative 2 %   Eosinophils Absolute 0.1 0.0 - 0.5 K/uL    Basophils Relative 0 %   Basophils Absolute 0.0 0.0 - 0.1 K/uL   Immature Granulocytes 0 %   Abs Immature Granulocytes 0.01 0.00 - 0.07 K/uL    Comment: Performed at [facility], [address], [place], [place] [address]  Salicylate level     Status: None   Collection Time: 09/17/18  4:52 PM  Result Value Ref Range   Salicylate Lvl <7.0 2.8 - 30.0 mg/dL    Comment: Performed at [facility], [address], [place], [place] [address]  Acetaminophen level     Status: Abnormal   Collection Time: 09/17/18  4:52 PM  Result Value Ref Range   Acetaminophen (Tylenol), Serum <10 (L) 10 - 30 ug/mL    Comment: (NOTE) Therapeutic concentrations vary significantly. A range of 10-30 ug/mL  may be an effective concentration for many patients. However, some  are best treated at concentrations outside of this range. Acetaminophen concentrations >150 ug/mL at 4 hours after ingestion  and >50 ug/mL at 12 hours after ingestion are often associated with  toxic reactions. Performed  at [facility], [address] [place], [place] [id number]   I-Stat beta hCG blood, ED     Status: None   Collection Time: 09/17/18  4:56 PM  Result Value Ref Range   I-stat hCG, quantitative <5.0 <5 mIU/mL   Comment 3            Comment:   GEST. AGE      CONC.  (mIU/mL)   <=1 WEEK        5 - 50     2 WEEKS       50 - 500     3 WEEKS       100 - 10,000     4 WEEKS     1,000 - 30,000        FEMALE AND NON-PREGNANT FEMALE:     LESS THAN 5 mIU/mL    Blood Alcohol level:  Lab Results  Component Value Date   ETH <10 09/17/2018   ETH 13 (H) 02/27/2018   Metabolic Disorder Labs:  No results found for: HGBA1C, MPG No results found for: PROLACTIN No results found for: CHOL, TRIG, HDL, CHOLHDL, VLDL, LDLCALC  Current Medications: Current Facility-Administered Medications  Medication Dose Route Frequency Provider Last Rate Last Dose  . acetaminophen (TYLENOL) tablet 650 mg  650 mg Oral Q6H PRN  Nira ConnBerry, Jason A, NP      . alum & mag hydroxide-simeth (MAALOX/MYLANTA) 200-200-20 MG/5ML suspension 30 mL  30 mL Oral Q4H PRN Nira ConnBerry, Jason A, NP      . hydrOXYzine (ATARAX/VISTARIL) tablet 25 mg  25 mg Oral Q6H PRN Nira ConnBerry, Jason A, NP   25 mg at 09/18/18 0136  . loperamide (IMODIUM) capsule 2-4 mg  2-4 mg Oral PRN Nira ConnBerry, Jason A, NP      . LORazepam (ATIVAN) tablet 1 mg  1 mg Oral Q6H PRN Nira ConnBerry, Jason A, NP      . LORazepam (ATIVAN) tablet 1 mg  1 mg Oral QID Nira ConnBerry, Jason A, NP   1 mg at 09/18/18 0835   Followed by  . [START ON 09/19/2018] LORazepam (ATIVAN) tablet 1 mg  1 mg Oral TID Jackelyn PolingBerry, Jason A, NP       Followed by  . [START ON 09/20/2018] LORazepam (ATIVAN) tablet 1 mg  1 mg Oral BID Jackelyn PolingBerry, Jason A, NP       Followed by  . [START ON 09/22/2018] LORazepam (ATIVAN) tablet 1 mg  1 mg Oral Daily Nira ConnBerry, Jason A, NP      . magnesium hydroxide (MILK OF MAGNESIA) suspension 30 mL  30 mL Oral Daily PRN Nira ConnBerry, Jason A, NP      . multivitamin with minerals tablet 1 tablet  1 tablet Oral Daily Nira ConnBerry, Jason A, NP   1 tablet at 09/18/18 0834  . ondansetron (ZOFRAN-ODT) disintegrating tablet 4 mg  4 mg Oral Q6H PRN Jackelyn PolingBerry, Jason A, NP      . Melene Muller[START ON 09/19/2018] thiamine (VITAMIN B-1) tablet 100 mg  100 mg Oral Daily Nira ConnBerry, Jason A, NP      . traZODone (DESYREL) tablet 50 mg  50 mg Oral QHS PRN Jackelyn PolingBerry, Jason A, NP   50 mg at 09/18/18 0136   PTA Medications: Medications Prior to Admission  Medication Sig Dispense Refill Last Dose  . alum & mag hydroxide-simeth (MAALOX/MYLANTA) 200-200-20 MG/5ML suspension Take 15 mLs by mouth every 6 (six) hours as needed for indigestion or heartburn. 355 mL 0 unk at prn  .  ARIPiprazole (ABILIFY) 5 MG tablet Take 1 tablet (5 mg total) by mouth daily. (Patient not taking: Reported on 09/17/2018) 7 tablet 0 Not Taking at Unknown time  . Biotin 2500 MCG CAPS Take 1 capsule by mouth daily.   09/16/2018 at Unknown time  . famotidine (PEPCID) 20 MG tablet Take 1 tablet (20 mg total) by  mouth 2 (two) times daily. 60 tablet 0   . gabapentin (NEURONTIN) 300 MG capsule Take 1 capsule (300 mg total) by mouth 4 (four) times daily. For withdrawal symptoms (Patient not taking: Reported on 09/17/2018) 20 capsule 0 Not Taking at Unknown time  . ibuprofen (ADVIL,MOTRIN) 200 MG tablet Take 800 mg by mouth daily as needed for moderate pain.   09/16/2018 at Unknown time  . levothyroxine (SYNTHROID, LEVOTHROID) 25 MCG tablet Take 1 tablet (25 mcg total) by mouth daily before breakfast. (Patient not taking: Reported on 09/17/2018) 7 tablet 0 Not Taking at Unknown time  . Multiple Vitamins-Minerals (MULTIVITAMIN WITH MINERALS) tablet Take 1 tablet by mouth daily.   09/16/2018 at Unknown time  . nicotine (NICODERM CQ - DOSED IN MG/24 HOURS) 21 mg/24hr patch Place 1 patch (21 mg total) onto the skin daily. (Patient not taking: Reported on 09/17/2018) 28 patch 0 Not Taking at Unknown time  . ondansetron (ZOFRAN) 4 MG tablet Take 1 tablet (4 mg total) by mouth every 6 (six) hours as needed for nausea. (Patient not taking: Reported on 09/17/2018) 20 tablet 0 Not Taking at Unknown time  . prazosin (MINIPRESS) 1 MG capsule Take 1 capsule (1 mg total) by mouth at bedtime. (Patient not taking: Reported on 09/17/2018) 7 capsule 0 Not Taking at Unknown time  . sertraline (ZOLOFT) 50 MG tablet Take 1 tablet (50 mg total) by mouth daily. (Patient not taking: Reported on 09/17/2018) 7 tablet 0 Not Taking at Unknown time  . traZODone (DESYREL) 100 MG tablet Take 1 tablet (100 mg total) by mouth at bedtime as needed for sleep. (Patient not taking: Reported on 09/17/2018) 7 tablet 0 Not Taking at Unknown time    Musculoskeletal: Strength & Muscle Tone: within normal limits- mild distal tremors, no psychomotor agitation or restlessness  Gait & Station: normal Patient leans: N/A  Psychiatric Specialty Exam: Physical Exam  Nursing note and vitals reviewed. Constitutional: She appears well-developed.  HENT:  Head:  Normocephalic.  Eyes: Pupils are equal, round, and reactive to light.  Neck: Normal range of motion.  Cardiovascular: Normal rate.  Respiratory: Effort normal.  GI: Soft.  Genitourinary:    Genitourinary Comments: Deferred   Musculoskeletal: Normal range of motion.  Neurological: She is alert.  Skin: Skin is warm.    Review of Systems  Constitutional: Negative.   HENT: Negative.   Eyes: Negative.   Respiratory: Negative.  Negative for cough and shortness of breath.   Cardiovascular: Negative.  Negative for chest pain and palpitations.  Gastrointestinal: Negative for abdominal pain, nausea and vomiting.  Genitourinary: Negative.   Musculoskeletal: Negative.   Skin: Negative.   Neurological: Positive for seizures.       Reports history of alcohol WDL related seizure in the past     Endo/Heme/Allergies: Negative.   Psychiatric/Behavioral: Positive for depression and substance abuse (UDS (+) for cocaine). Negative for hallucinations, memory loss and suicidal ideas. The patient is nervous/anxious and has insomnia.     Blood pressure 118/83, pulse 76, temperature 98.3 F (36.8 C), resp. rate 18, height 5\' 1"  (1.549 m), weight 114.8 kg, last menstrual period 09/18/2018.Body mass  index is 47.8 kg/m.  General Appearance: Fairly Groomed  Eye Contact:  Good  Speech:  Normal Rate  Volume:  Decreased  Mood:  depressed, anxious   Affect:  constricted, anxious   Thought Process:  Linear and Descriptions of Associations: Intact  Orientation:  Other:  fully alert and attentive   Thought Content:  no current hallucinations, no delusions, not internally preoccupied   Suicidal Thoughts:  No denies current suicidal or self injurious ideations, denies any homicidal ideations, and contracts for safety on unit   Homicidal Thoughts:  No  Memory:  recent and remote grossly intact   Judgement:  Fair  Insight:  Fair  Psychomotor Activity:  Normal- mild distal tremors, but no diaphoresis, no facial  flushing, not restless  Concentration:  Concentration: Good and Attention Span: Good  Recall:  Good  Fund of Knowledge:  Good  Language:  Good  Akathisia:  Negative  Handed:  Right  AIMS (if indicated):     Assets:  Communication Skills Desire for Improvement Resilience  ADL's:  Intact  Cognition:  WNL  Sleep:  Number of Hours: 3.5(new admit )   Treatment Plan Summary: Daily contact with patient to assess and evaluate symptoms and progress in treatment and Medication management  Observation Level/Precautions:  15 minute checks  Laboratory: Per ED  Psychotherapy: milieu, group therapy     Medications: See [facility]  Consultations: As needed   Discharge Concerns: safety, mood stability, maintaining sobriety.  Estimated LOS: 2-4days   Other: Admit to the [address]    Physician Treatment Plan for Primary Diagnosis:  Polysubstance Use Disorder.  Long Term Goal(s): Improvement in symptoms so as ready for discharge  Short Term Goals: Ability to identify changes in lifestyle to reduce recurrence of condition will improve, Ability to verbalize feelings will improve and Ability to demonstrate self-control will improve  Physician Treatment Plan for Secondary Diagnosis: MDD versus Alcohol Induced Mood Disorder   Long Term Goal(s): Improvement in symptoms so as ready for discharge  Short Term Goals: Ability to identify and develop effective coping behaviors will improve, Compliance with prescribed medications will improve and Ability to identify triggers associated with substance abuse/mental health issues will improve  I certify that inpatient services furnished can reasonably be expected to improve the patient's condition.    Armandina Stammer, NP, PMHNP, FNP-BC 2/1/20209:50 AM

## 2018-09-18 NOTE — ED Notes (Addendum)
Pt left w/ PELHAM.  Belongings to [facility] for safety.  RN gave pt her shoes minus the shoe laces to walk to [facility] (raining).  Pt signed consent for transfer and [facility] consent.

## 2018-09-18 NOTE — Progress Notes (Addendum)
Pt observe in the room, seen resting in bed with eyes open. Pt appears flat in affect and mood. Pt denies SI/HI/AVH at this time. Rates pain 8/10; Generalized. Pt c/o of restless legs and withdrawal s/s. Pt preoccupied with various somatic c/o's. Support offered. Will continue with POC.
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
## 2018-09-19 NOTE — [facility] Group Notes (Signed)
[facility] Group Notes: (Clinical Social Work)   09/19/2018      Type of Therapy:  Group Therapy   Participation Level:  Did Not Attend despite MHT prompting   Ambrose Mantle, LCSW 09/19/2018, 6:24 PM

## 2018-09-19 NOTE — Progress Notes (Signed)
[facility] MD Progress Note  09/19/2018 11:41 AM Shirley Murphy  MRN:  [id number]  Subjective: Shirley Murphy states "  I am not  doing well today my body aches all over."  Evaluation: patient observed standing at the nurses station.  Patient presents irritable, guarded and flat.  Reports chronic passive suicidal ideations as she reports she is detoxing from heroin for the past 2 years.  Reports taking Zoloft as prescribed and tolerating it well.  Rates her depression 8 out of 10 with 10 being the worst.  Patient reports she feels like she just needs to rest.  Patient encouraged continued increased hydration.  Reports plans to attend daily group sessions on today.  Reports a good appetite.  Reports interrupted sleep due to night sweats.  Support encouragement reassurance was provided.   History: Per assessment note: Patient is a 33 year old female with a past psychiatric history significant for major depression, alcohol dependence, heroin dependence as well as hepatitis C who presented to the [facility] emergency department on 09/17/2018 with suicidal ideation. The patient had her last psychiatric at [facility] on 07/06/2018. At that time the patient had been living in an [place] [facility], got very depressed, wanted to drink, but used cocaine. Her last psychiatric hospitalization at our facility was in July 2019. At that time she reported a history of posttraumatic stress disorder,and polysubstance use disorders. She was discharged on Campral, Abilify, gabapentin, hydroxyzine, prazosin, levothyroxine, sertraline and trazodone. Her dosageof sertraline when she was discharged from [facility] was 100 mg a day. Patient stated she had been sober for 3 months and was living in a sober living house approximately 2-1/2 months ago. She left that environment, and relapsed, and has been on heroin, cocaine, alcohol since that point. She stated she has been using all 3  substances on a daily basis. She is been using approximately a gram of heroin, 1.5 g of cocaine and 12 beers a day. The patient stated she had attempted to get back into the [facility] and self detox, but was unable to do so because of withdrawal symptoms. She also claimed to have suicidal ideation with a plan to overdose. She is had previous behavioral health hospital admissions as well as admissions to [facility] and [facility]  Principal Problem: <principal problem not specified> Diagnosis: Active Problems:   Severe recurrent major depression without psychotic features (HCC)  Total Time spent with patient: 15 minutes  Past Psychiatric History:   Past Medical History:  Past Medical History:  Diagnosis Date  . Anxiety   . Chest pain   . Depression   . Gallstones   . Hepatitis C   . Hypertension    patient reports only during detox  . Irregular heart beat     Past Surgical History:  Procedure Laterality Date  . c sections    . TUBAL LIGATION     Family History:  Family History  Problem Relation Age of Onset  . Hypertension Father   . Pancreatic cancer Father    Family Psychiatric  History:  Social History:  Social History   Substance and Sexual Activity  Alcohol Use Yes  . Alcohol/week: 12.0 standard drinks  . Types: 12 Cans of beer per week   Comment: daily use recently     Social History   Substance and Sexual Activity  Drug Use Yes  . Types: Cocaine, Heroin   Comment: daily use of cocaine and heroin    Social History  Socioeconomic History  . Marital status: Legally Separated    Spouse name: Not on file  . Number of children: Not on file  . Years of education: Not on file  . Highest education level: Not on file  Occupational History  . Not on file  Social Needs  . Financial resource strain: Not on file  . Food insecurity:    Worry: Not on file    Inability: Not on file  . Transportation needs:    Medical: Not on file    Non-medical: Not on file   Tobacco Use  . Smoking status: Current Every Day Smoker    Packs/day: 1.00    Types: Cigarettes  . Smokeless tobacco: Never Used  Substance and Sexual Activity  . Alcohol use: Yes    Alcohol/week: 12.0 standard drinks    Types: 12 Cans of beer per week    Comment: daily use recently  . Drug use: Yes    Types: Cocaine, Heroin    Comment: daily use of cocaine and heroin  . Sexual activity: Yes    Birth control/protection: None  Lifestyle  . Physical activity:    Days per week: Not on file    Minutes per session: Not on file  . Stress: Not on file  Relationships  . Social connections:    Talks on phone: Not on file    Gets together: Not on file    Attends religious service: Not on file    Active member of club or organization: Not on file    Attends meetings of clubs or organizations: Not on file    Relationship status: Not on file  Other Topics Concern  . Not on file  Social History Narrative  . Not on file   Additional Social History:    History of alcohol / drug use?: Yes Longest period of sobriety (when/how long): 3 months Negative Consequences of Use: Financial, Armed forces operational officer, Personal relationships, Work / School Withdrawal Symptoms: Irritability, Nausea / Vomiting, Tremors, Weakness, Sweats Name of Substance 1: ETOH 1 - Amount (size/oz): 12 beers 1 - Frequency: daily 1 - Duration: last 2 months Name of Substance 2: Heroin 2 - Amount (size/oz): 1 gram 2 - Frequency: daily 2 - Duration: last 2 months Name of Substance 3: Cocaine 3 - Amount (size/oz): 1.5 grams 3 - Frequency: daily 3 - Duration: last 2 months              Sleep: Fair  Appetite:  Fair  Current Medications: Current Facility-Administered Medications  Medication Dose Route Frequency Provider Last Rate Last Dose  . alum & mag hydroxide-simeth (MAALOX/MYLANTA) 200-200-20 MG/5ML suspension 30 mL  30 mL Oral Q4H PRN Nira Conn A, NP      . dicyclomine (BENTYL) tablet 20 mg  20 mg Oral Q6H PRN  Antonieta Pert, MD      . folic acid (FOLVITE) tablet 1 mg  1 mg Oral Daily Antonieta Pert, MD   1 mg at 09/19/18 1041  . hydrOXYzine (ATARAX/VISTARIL) tablet 25 mg  25 mg Oral Q6H PRN Jackelyn Poling, NP   25 mg at 09/18/18 2047  . loperamide (IMODIUM) capsule 2-4 mg  2-4 mg Oral PRN Nira Conn A, NP      . LORazepam (ATIVAN) tablet 1 mg  1 mg Oral Q6H PRN Nira Conn A, NP   1 mg at 09/19/18 1048  . magnesium hydroxide (MILK OF MAGNESIA) suspension 30 mL  30 mL Oral Daily PRN Nira Conn  A, NP      . methocarbamol (ROBAXIN) tablet 500 mg  500 mg Oral Q8H PRN Antonieta Pertlary, Greg Lawson, MD   500 mg at 09/18/18 2022  . multivitamin with minerals tablet 1 tablet  1 tablet Oral Daily Nira ConnBerry, Jason A, NP   1 tablet at 09/19/18 1041  . naproxen (NAPROSYN) tablet 500 mg  500 mg Oral BID PRN Antonieta Pertlary, Greg Lawson, MD   500 mg at 09/19/18 1044  . nicotine (NICODERM CQ - dosed in mg/24 hours) patch 21 mg  21 mg Transdermal Daily Oneta RackLewis, Garris Melhorn N, NP   Stopped at 09/19/18 1045  . ondansetron (ZOFRAN-ODT) disintegrating tablet 4 mg  4 mg Oral Q6H PRN Nira ConnBerry, Jason A, NP   4 mg at 09/18/18 1304  . sertraline (ZOLOFT) tablet 25 mg  25 mg Oral Daily Antonieta Pertlary, Greg Lawson, MD   25 mg at 09/19/18 1041  . thiamine (VITAMIN B-1) tablet 100 mg  100 mg Oral Daily Nira ConnBerry, Jason A, NP   100 mg at 09/19/18 1041  . traZODone (DESYREL) tablet 50 mg  50 mg Oral QHS PRN,MR X 1 Nira ConnBerry, Jason A, NP   50 mg at 09/18/18 2141    Lab Results:  Results for orders placed or performed during the hospital encounter of 09/17/18 (from the past 48 hour(s))  Urine rapid drug screen (hosp performed)     Status: Abnormal   Collection Time: 09/17/18  4:40 PM  Result Value Ref Range   Opiates NONE DETECTED NONE DETECTED   Cocaine POSITIVE (A) NONE DETECTED   Benzodiazepines NONE DETECTED NONE DETECTED   Amphetamines NONE DETECTED NONE DETECTED   Tetrahydrocannabinol NONE DETECTED NONE DETECTED   Barbiturates NONE DETECTED NONE DETECTED     Comment: (NOTE) DRUG SCREEN FOR MEDICAL PURPOSES ONLY.  IF CONFIRMATION IS NEEDED FOR ANY PURPOSE, NOTIFY LAB WITHIN 5 DAYS. LOWEST DETECTABLE LIMITS FOR URINE DRUG SCREEN Drug Class                     Cutoff (ng/mL) Amphetamine and metabolites    1000 Barbiturate and metabolites    200 Benzodiazepine                 200 Tricyclics and metabolites     300 Opiates and metabolites        300 Cocaine and metabolites        300 THC                            50 Performed at [facility], [address] [place], [place] [address]   Urinalysis, Routine w reflex microscopic     Status: Abnormal   Collection Time: 09/17/18  4:40 PM  Result Value Ref Range   Color, Urine YELLOW YELLOW   APPearance HAZY (A) CLEAR   Specific Gravity, Urine 1.031 (H) 1.005 - 1.030   pH 5.0 5.0 - 8.0   Glucose, UA NEGATIVE NEGATIVE mg/dL   Hgb urine dipstick SMALL (A) NEGATIVE   Bilirubin Urine NEGATIVE NEGATIVE   Ketones, ur 5 (A) NEGATIVE mg/dL   Protein, ur NEGATIVE NEGATIVE mg/dL   Nitrite NEGATIVE NEGATIVE   Leukocytes, UA NEGATIVE NEGATIVE   RBC / HPF 6-10 0 - 5 RBC/hpf   WBC, UA 0-5 0 - 5 WBC/hpf   Bacteria, UA FEW (A) NONE SEEN   Squamous Epithelial / LPF 0-5 0 - 5   Mucus PRESENT  Ca Oxalate Crys, UA PRESENT     Comment: Performed at [facility], [address] [place], [place] [address]  Comprehensive metabolic panel     Status: Abnormal   Collection Time: 09/17/18  4:52 PM  Result Value Ref Range   Sodium 140 135 - 145 mmol/L   Potassium 4.0 3.5 - 5.1 mmol/L   Chloride 107 98 - 111 mmol/L   CO2 25 22 - 32 mmol/L   Glucose, Bld 110 (H) 70 - 99 mg/dL   BUN 14 6 - 20 mg/dL   Creatinine, Ser 9.810.83 0.44 - 1.00 mg/dL   Calcium 8.9 8.9 - 19.110.3 mg/dL   Total Protein 6.5 6.5 - 8.1 g/dL   Albumin 3.2 (L) 3.5 - 5.0 g/dL   AST 29 15 - 41 U/L   ALT 79 (H) 0 - 44 U/L   Alkaline Phosphatase 50 38 - 126 U/L   Total Bilirubin 0.4 0.3 - 1.2 mg/dL   GFR calc non Af Amer >60 >60  mL/min   GFR calc Af Amer >60 >60 mL/min   Anion gap 8 5 - 15    Comment: Performed at [facility], [address] [place], [place] [address]  Ethanol     Status: None   Collection Time: 09/17/18  4:52 PM  Result Value Ref Range   Alcohol, Ethyl (B) <10 <10 mg/dL    Comment: (NOTE) Lowest detectable limit for serum alcohol is 10 mg/dL. For medical purposes only. Performed at [facility], [address] [place], [place] [address]   CBC with Diff     Status: Abnormal   Collection Time: 09/17/18  4:52 PM  Result Value Ref Range   WBC 5.2 4.0 - 10.5 K/uL   RBC 4.16 3.87 - 5.11 MIL/uL   Hemoglobin 8.7 (L) 12.0 - 15.0 g/dL    Comment: Reticulocyte Hemoglobin testing may be clinically indicated, consider ordering this additional test [id number]    HCT 31.6 (L) 36.0 - 46.0 %   MCV 76.0 (L) 80.0 - 100.0 fL   MCH 20.9 (L) 26.0 - 34.0 pg   MCHC 27.5 (L) 30.0 - 36.0 g/dL   RDW 96.215.7 (H) 95.211.5 - 84.115.5 %   Platelets 245 150 - 400 K/uL   nRBC 0.0 0.0 - 0.2 %   Neutrophils Relative % 52 %   Neutro Abs 2.6 1.7 - 7.7 K/uL   Lymphocytes Relative 38 %   Lymphs Abs 2.0 0.7 - 4.0 K/uL   Monocytes Relative 8 %   Monocytes Absolute 0.4 0.1 - 1.0 K/uL   Eosinophils Relative 2 %   Eosinophils Absolute 0.1 0.0 - 0.5 K/uL   Basophils Relative 0 %   Basophils Absolute 0.0 0.0 - 0.1 K/uL   Immature Granulocytes 0 %   Abs Immature Granulocytes 0.01 0.00 - 0.07 K/uL    Comment: Performed at [facility], [address] [place], [place] [address]  Salicylate level     Status: None   Collection Time: 09/17/18  4:52 PM  Result Value Ref Range   Salicylate Lvl <7.0 2.8 - 30.0 mg/dL    Comment: Performed at [facility], [address] [place], [place] [address]  Acetaminophen level     Status: Abnormal   Collection Time: 09/17/18  4:52 PM  Result Value Ref Range   Acetaminophen (Tylenol), Serum <10 (L) 10 - 30 ug/mL    Comment: (NOTE) Therapeutic concentrations vary  significantly. A  range of 10-30 ug/mL  may be an effective concentration for many patients. However, some  are best treated at concentrations outside of this range. Acetaminophen concentrations >150 ug/mL at 4 hours after ingestion  and >50 ug/mL at 12 hours after ingestion are often associated with  toxic reactions. Performed at [facility], [address] [place], [place] [address]   I-Stat beta hCG blood, ED     Status: None   Collection Time: 09/17/18  4:56 PM  Result Value Ref Range   I-stat hCG, quantitative <5.0 <5 mIU/mL   Comment 3            Comment:   GEST. AGE      CONC.  (mIU/mL)   <=1 WEEK        5 - 50     2 WEEKS       50 - 500     3 WEEKS       100 - 10,000     4 WEEKS     1,000 - 30,000        FEMALE AND NON-PREGNANT FEMALE:     LESS THAN 5 mIU/mL     Blood Alcohol level:  Lab Results  Component Value Date   ETH <10 09/17/2018   ETH 13 (H) 02/27/2018    Metabolic Disorder Labs: No results found for: HGBA1C, MPG No results found for: PROLACTIN No results found for: CHOL, TRIG, HDL, CHOLHDL, VLDL, LDLCALC  Physical Findings: AIMS: Facial and Oral Movements Muscles of Facial Expression: None, normal Lips and Perioral Area: None, normal Jaw: None, normal Tongue: None, normal,Extremity Movements Upper (arms, wrists, hands, fingers): None, normal Lower (legs, knees, ankles, toes): None, normal, Trunk Movements Neck, shoulders, hips: None, normal, Overall Severity Severity of abnormal movements (highest score from questions above): None, normal Incapacitation due to abnormal movements: None, normal Patient's awareness of abnormal movements (rate only patient's report): No Awareness, Dental Status Current problems with teeth and/or dentures?: No Does patient usually wear dentures?: No  CIWA:  CIWA-Ar Total: 4 COWS:  COWS Total Score: 7  Musculoskeletal: Strength & Muscle Tone: within normal limits Gait & Station: normal Patient leans:  N/A  Psychiatric Specialty Exam: Physical Exam  Review of Systems  Psychiatric/Behavioral: Positive for depression. Negative for hallucinations. The patient is nervous/anxious and has insomnia.   All other systems reviewed and are negative.   Blood pressure 123/80, pulse 100, temperature 98 F (36.7 C), resp. rate 18, height 5\' 1"  (1.549 m), weight 114.8 kg, last menstrual period 09/18/2018, SpO2 100 %.Body mass index is 47.8 kg/m.  General Appearance: Casual  Eye Contact:  Fair  Speech:  Clear and Coherent  Volume:  Normal  Mood:  Anxious and Depressed  Affect:  Congruent  Thought Process:  Coherent  Orientation:  Full (Time, Place, and Person)  Thought Content:  Logical  Suicidal Thoughts:  Yes.  with intent/plan  Homicidal Thoughts:  No  Memory:  Immediate;   Fair Recent;   Fair Remote;   Fair  Judgement:  Fair  Insight:  Fair  Psychomotor Activity:  Normal  Concentration:  Concentration: Fair  Recall:  [employer] of Knowledge:  Fair  Language:  Fair  Akathisia:  No  Handed:  Right  AIMS (if indicated):     Assets:  Communication Skills Desire for Improvement Resilience Social Support  ADL's:  Intact  Cognition:  WNL  Sleep:  Number of Hours: 6     Treatment Plan Summary:  Daily contact with patient to assess and evaluate symptoms and progress in treatment and Medication management   Continue with current treatment plan on 09/19/2018 as listed below except for noted  Medication management:  Increased Zoloft 25 mg to 50 mg  p.o. daily Continue CIWA: Protocol Continue trazodone 50 mg p.o. nightly  CSW to continue working on discharge disposition Patient encouraged to participate within the therapeutic milieu  Oneta Rack, NP 09/19/2018, 11:41 AM

## 2018-09-19 NOTE — [facility] Group Notes (Signed)
[facility] Group Notes:  (Nursing/MHT/Case Management/Adjunct)  Date:  09/19/2018  Time:  1:56 PM  Type of Therapy:  Psychoeducational Skills  Participation Level:  Active  Participation Quality:  Appropriate  Affect:  Appropriate  Cognitive:  Appropriate  Insight:  Appropriate  Engagement in Group:  Engaged  Modes of Intervention:  Problem-solving  Summary of Progress/Problems: Pt attended Psychoeducational group with the topic healthy support systems.   Jacquelyne Balint Shanta 09/19/2018, 1:56 PM

## 2018-09-19 NOTE — Plan of Care (Signed)
  Problem: Activity: Goal: Interest or engagement in activities will improve Outcome: Progressing   Problem: Coping: Goal: Ability to demonstrate self-control will improve Outcome: Progressing   D: Pt alert and oriented on the unit. Pt denies SI/HI, A/VH. Pt Pt's goal for the day is "getting numbers for [facility]." Pt sat in the day room around noon and watched television and talked on the telephone. Pt also participated during afternoon unit groups and activities. Pt cooperative on the unit. A: Education, support and encouragement provided, q15 minute safety checks remain in effect. Medications administered per MD orders. R: No reactions/side effects to medicine noted. Pt denies any concerns at this time, and verbally contracts for safety. Pt ambulating on the unit with no issues. Pt remains safe on and off the unit. -

## 2018-09-20 DIAGNOSIS — F332 Major depressive disorder, recurrent severe without psychotic features: Principal | ICD-10-CM

## 2018-09-20 MED ORDER — CHLORDIAZEPOXIDE HCL 25 MG PO CAPS
25.0000 mg | ORAL_CAPSULE | Freq: Three times a day (TID) | ORAL | Status: AC
  Administered 2018-09-21 (×3): 25 mg via ORAL
  Filled 2018-09-20 (×3): qty 1

## 2018-09-20 MED ORDER — ONDANSETRON 4 MG PO TBDP
4.0000 mg | ORAL_TABLET | Freq: Four times a day (QID) | ORAL | Status: DC | PRN
  Administered 2018-09-21: 4 mg via ORAL
  Filled 2018-09-20: qty 1

## 2018-09-20 MED ORDER — ONDANSETRON 8 MG PO TBDP
8.0000 mg | ORAL_TABLET | Freq: Three times a day (TID) | ORAL | Status: AC
  Administered 2018-09-20 (×2): 8 mg via ORAL
  Filled 2018-09-20: qty 2
  Filled 2018-09-20 (×2): qty 1

## 2018-09-20 MED ORDER — CHLORDIAZEPOXIDE HCL 25 MG PO CAPS
25.0000 mg | ORAL_CAPSULE | ORAL | Status: DC
  Administered 2018-09-22: 25 mg via ORAL
  Filled 2018-09-20: qty 1

## 2018-09-20 MED ORDER — CHLORDIAZEPOXIDE HCL 25 MG PO CAPS
25.0000 mg | ORAL_CAPSULE | Freq: Four times a day (QID) | ORAL | Status: AC
  Administered 2018-09-20 (×4): 25 mg via ORAL
  Filled 2018-09-20 (×3): qty 1

## 2018-09-20 MED ORDER — CLONIDINE HCL 0.1 MG PO TABS: 0.1000 mg | ORAL_TABLET | ORAL | Status: DC | PRN

## 2018-09-20 MED ORDER — POLYETHYLENE GLYCOL 3350 17 G PO PACK
17.0000 g | PACK | Freq: Every day | ORAL | Status: DC | PRN
  Filled 2018-09-20: qty 1

## 2018-09-20 MED ORDER — CHLORDIAZEPOXIDE HCL 25 MG PO CAPS
ORAL_CAPSULE | ORAL | Status: AC
  Filled 2018-09-20: qty 1

## 2018-09-20 MED ORDER — CHLORDIAZEPOXIDE HCL 25 MG PO CAPS: 25.0000 mg | ORAL_CAPSULE | Freq: Every day | ORAL | Status: DC

## 2018-09-20 MED ORDER — METHOCARBAMOL 500 MG PO TABS
1000.0000 mg | ORAL_TABLET | Freq: Three times a day (TID) | ORAL | Status: DC
  Administered 2018-09-20 – 2018-09-22 (×6): 1000 mg via ORAL
  Filled 2018-09-20 (×11): qty 2

## 2018-09-20 NOTE — Progress Notes (Signed)
DAR NOTE: Pt present with flat affect and depressed mood in the unit. Pt has been isolating most of am, got up for lunch and has been up. Pt complained of nausea, constipation and aches all over the body  . Pt took all her meds as scheduled. As per self inventory, pt had a poor night sleep, good appetite, low  energy, and poor concentration. Pt rate depression at 6, hopeless ness at 8, and anxiety at 10. Pt's safety ensured with 15 minute and environmental checks. Pt currently denies SI/HI and A/V hallucinations. Pt verbally agrees to seek staff if SI/HI or A/VH occurs and to consult with staff before acting on these thoughts. Will continue POC.

## 2018-09-20 NOTE — Progress Notes (Signed)
D: Patient observed isolative to room much of the night. Came out for medications and briefly when her visitor was here. Patient states, "I'm having a really bad detox this time. And I've been crying much of the day. But not as much as yesterday." Patient's affect blunted, mood anxious. Reports having a headache that is "pretty much ongoing" as well as sweats, muscle tension, agitation and anxiety. Patient also complains of constipation but states she is passing gas.  A: Medicated per orders, several prns requested and given - naproxen, robaxin, MOM, vistaril and trazadone. Medication education provided. Level III obs in place for safety. Emotional support offered. Patient encouraged to complete Suicide Safety Plan before discharge. Encouraged to attend and participate in unit programming.    R: Patient verbalizes understanding of POC. On reassess, patient resting in bed, symptoms somewhat improved. Patient denies SI/HI/AVH and remains safe on level III obs. Will continue to monitor throughout the night.

## 2018-09-20 NOTE — Progress Notes (Signed)
CSW met with patient at bedside to introduce self and role. Patient reports significant withdrawal symptoms.  CSW provided patient with a printed list of [place] in [place]. Patient appreciative, patient observed making phone calls from the [facility] following conversation.  Patient reports she has had prior [facility] admissions and has gone to [place] and [facility]. She intends to follow up with [facility] and outpatient treatment. She politely declines referrals for residential treatment saying "I've completed the programs and I understand what I need to do (to stay sober.)"  Stephanie Acre, [place] Social Worker

## 2018-09-20 NOTE — Tx Team (Signed)
Interdisciplinary Treatment and Diagnostic Plan Update  09/20/2018 Time of Session: 9:00am Shirley Murphy MRN: [id number]  Principal Diagnosis: Severe recurrent major depression without psychotic features [facility])  Secondary Diagnoses: Principal Problem:   Severe recurrent major depression without psychotic features (HCC)   Current Medications:  Current Facility-Administered Medications  Medication Dose Route Frequency Provider Last Rate Last Dose  . alum & mag hydroxide-simeth (MAALOX/MYLANTA) 200-200-20 MG/5ML suspension 30 mL  30 mL Oral Q4H PRN Nira Conn A, NP      . chlordiazePOXIDE (LIBRIUM) 25 MG capsule           . chlordiazePOXIDE (LIBRIUM) capsule 25 mg  25 mg Oral QID Malvin Johns, MD   25 mg at 09/20/18 1011   Followed by  . [START ON 09/21/2018] chlordiazePOXIDE (LIBRIUM) capsule 25 mg  25 mg Oral TID Malvin Johns, MD       Followed by  . [START ON 09/22/2018] chlordiazePOXIDE (LIBRIUM) capsule 25 mg  25 mg Oral Nicki Guadalajara, MD       Followed by  . [START ON 09/23/2018] chlordiazePOXIDE (LIBRIUM) capsule 25 mg  25 mg Oral Daily Malvin Johns, MD      . cloNIDine (CATAPRES) tablet 0.1 mg  0.1 mg Oral Q4H PRN Malvin Johns, MD      . dicyclomine (BENTYL) tablet 20 mg  20 mg Oral Q6H PRN Antonieta Pert, MD   20 mg at 09/19/18 1309  . folic acid (FOLVITE) tablet 1 mg  1 mg Oral Daily Antonieta Pert, MD   1 mg at 09/20/18 3748  . hydrOXYzine (ATARAX/VISTARIL) tablet 25 mg  25 mg Oral Q6H PRN Jackelyn Poling, NP   25 mg at 09/19/18 2112  . loperamide (IMODIUM) capsule 2-4 mg  2-4 mg Oral PRN Nira Conn A, NP      . magnesium hydroxide (MILK OF MAGNESIA) suspension 30 mL  30 mL Oral Daily PRN Nira Conn A, NP   30 mL at 09/19/18 2115  . methocarbamol (ROBAXIN) tablet 1,000 mg  1,000 mg Oral TID Malvin Johns, MD      . multivitamin with minerals tablet 1 tablet  1 tablet Oral Daily Nira Conn A, NP   1 tablet at 09/20/18 0817  . naproxen (NAPROSYN) tablet 500 mg   500 mg Oral BID PRN Antonieta Pert, MD   500 mg at 09/20/18 2707  . nicotine (NICODERM CQ - dosed in mg/24 hours) patch 21 mg  21 mg Transdermal Daily Oneta Rack, NP   21 mg at 09/19/18 1308  . ondansetron (ZOFRAN-ODT) disintegrating tablet 4 mg  4 mg Oral Q6H PRN Malvin Johns, MD      . ondansetron (ZOFRAN-ODT) disintegrating tablet 8 mg  8 mg Oral TID Malvin Johns, MD      . sertraline (ZOLOFT) tablet 25 mg  25 mg Oral Daily Antonieta Pert, MD   25 mg at 09/20/18 0817  . thiamine (VITAMIN B-1) tablet 100 mg  100 mg Oral Daily Nira Conn A, NP   100 mg at 09/20/18 0817  . traZODone (DESYREL) tablet 50 mg  50 mg Oral QHS PRN,MR X 1 Nira Conn A, NP   50 mg at 09/19/18 2113   PTA Medications: Medications Prior to Admission  Medication Sig Dispense Refill Last Dose  . alum & mag hydroxide-simeth (MAALOX/MYLANTA) 200-200-20 MG/5ML suspension Take 15 mLs by mouth every 6 (six) hours as needed for indigestion or heartburn. 355 mL 0 unk at prn  .  ARIPiprazole (ABILIFY) 5 MG tablet Take 1 tablet (5 mg total) by mouth daily. (Patient not taking: Reported on 09/17/2018) 7 tablet 0 Not Taking at Unknown time  . Biotin 2500 MCG CAPS Take 1 capsule by mouth daily.   09/16/2018 at Unknown time  . famotidine (PEPCID) 20 MG tablet Take 1 tablet (20 mg total) by mouth 2 (two) times daily. 60 tablet 0   . gabapentin (NEURONTIN) 300 MG capsule Take 1 capsule (300 mg total) by mouth 4 (four) times daily. For withdrawal symptoms (Patient not taking: Reported on 09/17/2018) 20 capsule 0 Not Taking at Unknown time  . ibuprofen (ADVIL,MOTRIN) 200 MG tablet Take 800 mg by mouth daily as needed for moderate pain.   09/16/2018 at Unknown time  . levothyroxine (SYNTHROID, LEVOTHROID) 25 MCG tablet Take 1 tablet (25 mcg total) by mouth daily before breakfast. (Patient not taking: Reported on 09/17/2018) 7 tablet 0 Not Taking at Unknown time  . Multiple Vitamins-Minerals (MULTIVITAMIN WITH MINERALS) tablet Take 1  tablet by mouth daily.   09/16/2018 at Unknown time  . nicotine (NICODERM CQ - DOSED IN MG/24 HOURS) 21 mg/24hr patch Place 1 patch (21 mg total) onto the skin daily. (Patient not taking: Reported on 09/17/2018) 28 patch 0 Not Taking at Unknown time  . ondansetron (ZOFRAN) 4 MG tablet Take 1 tablet (4 mg total) by mouth every 6 (six) hours as needed for nausea. (Patient not taking: Reported on 09/17/2018) 20 tablet 0 Not Taking at Unknown time  . prazosin (MINIPRESS) 1 MG capsule Take 1 capsule (1 mg total) by mouth at bedtime. (Patient not taking: Reported on 09/17/2018) 7 capsule 0 Not Taking at Unknown time  . sertraline (ZOLOFT) 50 MG tablet Take 1 tablet (50 mg total) by mouth daily. (Patient not taking: Reported on 09/17/2018) 7 tablet 0 Not Taking at Unknown time  . traZODone (DESYREL) 100 MG tablet Take 1 tablet (100 mg total) by mouth at bedtime as needed for sleep. (Patient not taking: Reported on 09/17/2018) 7 tablet 0 Not Taking at Unknown time    Patient Stressors: Financial difficulties Medication change or noncompliance Substance abuse  Patient Strengths: Ability for insight Average or above average intelligence Capable of independent living Manufacturing systems engineer Motivation for treatment/growth  Treatment Modalities: Medication Management, Group therapy, Case management,  1 to 1 session with clinician, Psychoeducation, Recreational therapy.   Physician Treatment Plan for Primary Diagnosis: Severe recurrent major depression without psychotic features (HCC) Long Term Goal(s): Improvement in symptoms so as ready for discharge Improvement in symptoms so as ready for discharge   Short Term Goals: Ability to identify changes in lifestyle to reduce recurrence of condition will improve Ability to verbalize feelings will improve Ability to demonstrate self-control will improve Ability to identify and develop effective coping behaviors will improve Compliance with prescribed medications  will improve Ability to identify triggers associated with substance abuse/mental health issues will improve  Medication Management: Evaluate patient's response, side effects, and tolerance of medication regimen.  Therapeutic Interventions: 1 to 1 sessions, Unit Group sessions and Medication administration.  Evaluation of Outcomes: Progressing  Physician Treatment Plan for Secondary Diagnosis: Principal Problem:   Severe recurrent major depression without psychotic features (HCC)  Long Term Goal(s): Improvement in symptoms so as ready for discharge Improvement in symptoms so as ready for discharge   Short Term Goals: Ability to identify changes in lifestyle to reduce recurrence of condition will improve Ability to verbalize feelings will improve Ability to demonstrate self-control will improve Ability  to identify and develop effective coping behaviors will improve Compliance with prescribed medications will improve Ability to identify triggers associated with substance abuse/mental health issues will improve     Medication Management: Evaluate patient's response, side effects, and tolerance of medication regimen.  Therapeutic Interventions: 1 to 1 sessions, Unit Group sessions and Medication administration.  Evaluation of Outcomes: Progressing   RN Treatment Plan for Primary Diagnosis: Severe recurrent major depression without psychotic features (HCC) Long Term Goal(s): Knowledge of disease and therapeutic regimen to maintain health will improve  Short Term Goals: Ability to demonstrate self-control, Ability to participate in decision making will improve, Ability to identify and develop effective coping behaviors will improve and Compliance with prescribed medications will improve  Medication Management: RN will administer medications as ordered by provider, will assess and evaluate patient's response and provide education to patient for prescribed medication. RN will report any  adverse and/or side effects to prescribing provider.  Therapeutic Interventions: 1 on 1 counseling sessions, Psychoeducation, Medication administration, Evaluate responses to treatment, Monitor vital signs and CBGs as ordered, Perform/monitor CIWA, COWS, AIMS and Fall Risk screenings as ordered, Perform wound care treatments as ordered.  Evaluation of Outcomes: Progressing   LCSW Treatment Plan for Primary Diagnosis: Severe recurrent major depression without psychotic features (HCC) Long Term Goal(s): Safe transition to appropriate next level of care at discharge, Engage patient in therapeutic group addressing interpersonal concerns.  Short Term Goals: Engage patient in aftercare planning with referrals and resources, Increase social support, Identify triggers associated with mental health/substance abuse issues and Increase skills for wellness and recovery  Therapeutic Interventions: Assess for all discharge needs, 1 to 1 time with Social worker, Explore available resources and support systems, Assess for adequacy in community support network, Educate family and significant other(s) on suicide prevention, Complete Psychosocial Assessment, Interpersonal group therapy.  Evaluation of Outcomes: Progressing   Progress in Treatment: Attending groups: No. Participating in groups: No. Taking medication as prescribed: Yes. Toleration medication: Yes. Family/Significant other contact made: No, will contact:  declines consents. SPE completed with patient. Patient understands diagnosis: Yes. Discussing patient identified problems/goals with staff: Yes. Medical problems stabilized or resolved: No. Denies suicidal/homicidal ideation: Yes. Issues/concerns per patient self-inventory: Yes.  New problem(s) identified: Yes, Describe:  lives with cousin but states the home environment is not safe, Wants to go to an [facility] at discharge  New Short Term/Long Term Goal(s): detox, medication management  for mood stabilization; elimination of SI thoughts; development of comprehensive mental wellness/sobriety plan.  Patient Goals:  "I want to detox and go to an [facility]"  Discharge Plan or Barriers: [facility]AG pamphlet, Mobile Crisis information, and AA/NA information provided to patient for additional community support and resources.   Reason for Continuation of Hospitalization: Anxiety Depression Withdrawal symptoms  Estimated Length of Stay: 3-5 days  Attendees: Patient: 09/20/2018 11:49 AM  Physician:  09/20/2018 11:49 AM  Nursing:  09/20/2018 11:49 AM  RN Care Manager: 09/20/2018 11:49 AM  Social Worker: Enid Cutterharlotte Kellye Mizner, LCSWA 09/20/2018 11:49 AM  Recreational Therapist:  09/20/2018 11:49 AM  Other:  09/20/2018 11:49 AM  Other:  09/20/2018 11:49 AM  Other: 09/20/2018 11:49 AM    Scribe for Treatment Team: Darreld Mcleanharlotte C Hien Perreira, LCSWA 09/20/2018 11:49 AM

## 2018-09-20 NOTE — Progress Notes (Signed)
[facility] MD Progress Note  09/20/2018 9:42 AM Shirley Murphy  MRN:  [id number] Subjective:    Patient is in bed she is complaining of nausea and vomiting she is complaining of cramping and general malaise and withdrawal symptoms. 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 Anxiety   . Chest pain   . Depression   . Gallstones   . Hepatitis C   . Hypertension    patient reports only during detox  . Irregular heart beat     Past Surgical History:  Procedure Laterality Date  . c sections    . TUBAL LIGATION     Family History:  Family History  Problem Relation Age of Onset  . Hypertension Father   . Pancreatic cancer Father   Social History:  Social History   Substance and Sexual Activity  Alcohol Use Yes  . Alcohol/week: 12.0 standard drinks  . Types: 12 Cans of beer per week   Comment: daily use recently     Social History   Substance and Sexual Activity  Drug Use Yes  . Types: Cocaine, Heroin   Comment: daily use of cocaine and heroin    Social History   Socioeconomic History  . Marital status: Legally Separated    Spouse name: Not on file  . Number of children: Not on file  . Years of education: Not on file  . Highest education level: Not on file  Occupational History  . Not on file  Social Needs  . Financial resource strain: Not on file  . Food insecurity:    Worry: Not on file    Inability: Not on file  . Transportation needs:   Medical: Not on file    Non-medical: Not on file  Tobacco Use  . Smoking status: Current Every Day Smoker    Packs/day: 1.00    Types: Cigarettes  . Smokeless tobacco: Never Used  Substance and Sexual Activity  . Alcohol use: Yes    Alcohol/week: 12.0 standard drinks    Types: 12 Cans of beer per week    Comment: daily use recently  . Drug use: Yes    Types: Cocaine, Heroin    Comment: daily use of cocaine and heroin  . Sexual activity: Yes    Birth control/protection: None  Lifestyle  . Physical activity:    Days per week: Not on file    Minutes per session: Not on file  . Stress: Not on file  Relationships  . Social connections:    Talks on phone: Not on file    Gets together: Not on file    Attends religious service: Not on file    Active member of club or organization: Not on file    Attends meetings of clubs or organizations: Not on file    Relationship status: Not on file  Other Topics Concern  . Not on file  Social History Narrative  . Not on file   Additional Social History:    History of alcohol /  drug use?: Yes Longest period of sobriety (when/how long): 3 months Negative Consequences of Use: Financial, Legal, Personal relationships, Work / School Withdrawal Symptoms: Irritability, Nausea / Vomiting, Tremors, Weakness, Sweats Name of Substance 1: ETOH 1 - Amount (size/oz): 12 beers 1 - Frequency: daily 1 - Duration: last 2 months Name of Substance 2: Heroin 2 - Amount (size/oz): 1 gram 2 - Frequency: daily 2 - Duration: last 2 months Name of Substance 3: Cocaine 3 - Amount (size/oz): 1.5 grams 3 - Frequency: daily 3 - Duration: last 2 months              Sleep: Fair  Appetite:  Good  Current Medications: Current Facility-Administered Medications  Medication Dose Route Frequency Provider Last Rate Last Dose  . alum & mag hydroxide-simeth (MAALOX/MYLANTA) 200-200-20 MG/5ML suspension 30 mL  30 mL Oral Q4H PRN Nira Conn A, NP      .  chlordiazePOXIDE (LIBRIUM) capsule 25 mg  25 mg Oral QID Malvin Johns, MD       Followed by  . [START ON 09/21/2018] chlordiazePOXIDE (LIBRIUM) capsule 25 mg  25 mg Oral TID Malvin Johns, MD       Followed by  . [START ON 09/22/2018] chlordiazePOXIDE (LIBRIUM) capsule 25 mg  25 mg Oral Nicki Guadalajara, MD       Followed by  . [START ON 09/23/2018] chlordiazePOXIDE (LIBRIUM) capsule 25 mg  25 mg Oral Daily Malvin Johns, MD      . cloNIDine (CATAPRES) tablet 0.1 mg  0.1 mg Oral Q4H PRN Malvin Johns, MD      . dicyclomine (BENTYL) tablet 20 mg  20 mg Oral Q6H PRN Antonieta Pert, MD   20 mg at 09/19/18 1309  . folic acid (FOLVITE) tablet 1 mg  1 mg Oral Daily Antonieta Pert, MD   1 mg at 09/20/18 7262  . hydrOXYzine (ATARAX/VISTARIL) tablet 25 mg  25 mg Oral Q6H PRN Jackelyn Poling, NP   25 mg at 09/19/18 2112  . loperamide (IMODIUM) capsule 2-4 mg  2-4 mg Oral PRN Nira Conn A, NP      . magnesium hydroxide (MILK OF MAGNESIA) suspension 30 mL  30 mL Oral Daily PRN Nira Conn A, NP   30 mL at 09/19/18 2115  . methocarbamol (ROBAXIN) tablet 1,000 mg  1,000 mg Oral TID Malvin Johns, MD      . multivitamin with minerals tablet 1 tablet  1 tablet Oral Daily Nira Conn A, NP   1 tablet at 09/20/18 0817  . naproxen (NAPROSYN) tablet 500 mg  500 mg Oral BID PRN Antonieta Pert, MD   500 mg at 09/20/18 0355  . nicotine (NICODERM CQ - dosed in mg/24 hours) patch 21 mg  21 mg Transdermal Daily Oneta Rack, NP   21 mg at 09/19/18 1308  . ondansetron (ZOFRAN-ODT) disintegrating tablet 4 mg  4 mg Oral Q6H PRN Malvin Johns, MD      . ondansetron (ZOFRAN-ODT) disintegrating tablet 8 mg  8 mg Oral TID Malvin Johns, MD      . sertraline (ZOLOFT) tablet 25 mg  25 mg Oral Daily Antonieta Pert, MD   25 mg at 09/20/18 0817  . thiamine (VITAMIN B-1) tablet 100 mg  100 mg Oral Daily Nira Conn A, NP   100 mg at 09/20/18 0817  . traZODone (DESYREL) tablet 50 mg  50 mg Oral QHS PRN,MR X 1 Nira Conn A, NP   50 mg  at 09/19/18 2113    Lab Results: No results found for this or any previous visit (from the past 48 hour(s)).  Blood Alcohol level:  Lab Results  Component Value Date   ETH <10 09/17/2018   ETH 13 (H) 02/27/2018    Metabolic Disorder Labs: No results found for: HGBA1C, MPG No results found for: PROLACTIN No results found for: CHOL, TRIG, HDL, CHOLHDL, VLDL, LDLCALC  Physical Findings: AIMS: Facial and Oral Movements Muscles of Facial Expression: None, normal Lips and Perioral Area: None, normal Jaw: None, normal Tongue: None, normal,Extremity Movements Upper (arms, wrists, hands, fingers): None, normal Lower (legs, knees, ankles, toes): None, normal, Trunk Movements Neck, shoulders, hips: None, normal, Overall Severity Severity of abnormal movements (highest score from questions above): None, normal Incapacitation due to abnormal movements: None, normal Patient's awareness of abnormal movements (rate only patient's report): No Awareness, Dental Status Current problems with teeth and/or dentures?: No Does patient usually wear dentures?: No  CIWA:  CIWA-Ar Total: 11 COWS:  COWS Total Score: 8  Musculoskeletal: Strength & Muscle Tone: within normal limits Gait & Station: normal Patient leans: N/A  Psychiatric Specialty Exam: Physical Exam  ROS  Blood pressure (!) 151/86, pulse (!) 102, temperature 97.7 F (36.5 C), temperature source Oral, resp. rate 18, height 5\' 1"  (1.549 m), weight 114.8 kg, last menstrual period 09/18/2018, SpO2 100 %.Body mass index is 47.8 kg/m.  General Appearance: Casual  Eye Contact:  Minimal  Speech:  Clear and Coherent  Volume:  Decreased  Mood:  Dysphoric  Affect:  Congruent  Thought Process:  Goal Directed  Orientation:  Full (Time, Place, and Person)  Thought Content:  Tangential  Suicidal Thoughts:  No  Homicidal Thoughts:  No  Memory:  Immediate;   Fair  Judgement:  Fair  Insight:  Good  Psychomotor Activity:   Normal  Concentration:  Concentration: Fair  Recall:  Fis[employer]Fair  Fund of Knowledge:  Fair  Language:  Good  Akathisia:  Negative  Handed:  Right  AIMS (if indicated):     Assets:  Leisure Time Physical Health Resilience  ADL's:  Intact  Cognition:  WNL  Sleep:  Number of Hours: 5.75     Treatment Plan Summary: Daily contact with patient to assess and evaluate symptoms and progress in treatment, Medication management and Plan Continue current measures continue current precautions no change in group therapies but begin scheduled detox regimen with Librium add Catapres for blood pressure issues escalate Robaxin and add Zofran standing  Stephen Baruch, MD 09/20/2018, 9:42 AM

## 2018-09-20 NOTE — Progress Notes (Signed)
Recreation Therapy Notes  Date:  2.3.20 Time: 0930 Location: [address] Dayroom  Group Topic: Stress Management  Goal Area(s) Addresses:  Patient will identify positive stress management techniques. Patient will identify benefits of using stress management post d/c.  Intervention:  Stress Management  Activity :  Guided Imagery.  LRT introduced the stress management technique of guided imagery.  LRT read a script that took patients on a journey to the beach at sunrise.  Patients were to listen and follow along as script was read by LRT to engage in activity.  Education:  Stress Management, Discharge Planning.   Education Outcome: Acknowledges Education  Clinical Observations/Feedback: Pt did not attend group.      Caroll Rancher, LRT/CTRS         Caroll Rancher A 09/20/2018 10:53 AM

## 2018-09-21 NOTE — Progress Notes (Signed)
[facility] MD Progress Note  09/21/2018 8:59 AM Shirley Murphy  MRN:  [id number] Subjective:    Patient seen states that she is finally out of bed is pleased with her progress she is appreciative of the new detox regimen does not have thoughts of harming self but states she is generally weak and sluggish would like to wait 1 more day to finish a detox regimen and then go to the [place] [facility].  Contracting here No involuntary movements vital stable no tremors no psychosis no seizure activity Principal Problem: Severe recurrent major depression without psychotic features (HCC) Diagnosis: Principal Problem:   Severe recurrent major depression without psychotic features (HCC)  Total Time spent with patient: 20 minutes   Past Medical History:  Past Medical History:  Diagnosis Date  . Anxiety   . Chest pain   . Depression   . Gallstones   . Hepatitis C   . Hypertension    patient reports only during detox  . Irregular heart beat     Past Surgical History:  Procedure Laterality Date  . c sections    . TUBAL LIGATION     Family History:  Family History  Problem Relation Age of Onset  . Hypertension Father   . Pancreatic cancer Father   Social History:  Social History   Substance and Sexual Activity  Alcohol Use Yes  . Alcohol/week: 12.0 standard drinks  . Types: 12 Cans of beer per week   Comment: daily use recently     Social History   Substance and Sexual Activity  Drug Use Yes  . Types: Cocaine, Heroin   Comment: daily use of cocaine and heroin    Social History   Socioeconomic History  . Marital status: Legally Separated    Spouse name: Not on file  . Number of children: Not on file  . Years of education: Not on file  . Highest education level: Not on file  Occupational History  . Not on file  Social Needs  . Financial resource strain: Not on file  . Food insecurity:    Worry: Not on file    Inability: Not on file  . Transportation needs:    Medical: Not on  file    Non-medical: Not on file  Tobacco Use  . Smoking status: Current Every Day Smoker    Packs/day: 1.00    Types: Cigarettes  . Smokeless tobacco: Never Used  Substance and Sexual Activity  . Alcohol use: Yes    Alcohol/week: 12.0 standard drinks    Types: 12 Cans of beer per week    Comment: daily use recently  . Drug use: Yes    Types: Cocaine, Heroin    Comment: daily use of cocaine and heroin  . Sexual activity: Yes    Birth control/protection: None  Lifestyle  . Physical activity:    Days per week: Not on file    Minutes per session: Not on file  . Stress: Not on file  Relationships  . Social connections:    Talks on phone: Not on file    Gets together: Not on file    Attends religious service: Not on file    Active member of club or organization: Not on file    Attends meetings of clubs or organizations: Not on file    Relationship status: Not on file  Other Topics Concern  . Not on file  Social History Narrative  . Not on file   Additional Social History:  History of alcohol / drug use?: Yes Longest period of sobriety (when/how long): 3 months Negative Consequences of Use: Financial, Legal, Personal relationships, Work / School Withdrawal Symptoms: Irritability, Nausea / Vomiting, Tremors, Weakness, Sweats Name of Substance 1: ETOH 1 - Amount (size/oz): 12 beers 1 - Frequency: daily 1 - Duration: last 2 months Name of Substance 2: Heroin 2 - Amount (size/oz): 1 gram 2 - Frequency: daily 2 - Duration: last 2 months Name of Substance 3: Cocaine 3 - Amount (size/oz): 1.5 grams 3 - Frequency: daily 3 - Duration: last 2 months              Sleep: Fair  Appetite:  Fair  Current Medications: Current Facility-Administered Medications  Medication Dose Route Frequency Provider Last Rate Last Dose  . alum & mag hydroxide-simeth (MAALOX/MYLANTA) 200-200-20 MG/5ML suspension 30 mL  30 mL Oral Q4H PRN Nira Conn A, NP      . chlordiazePOXIDE  (LIBRIUM) capsule 25 mg  25 mg Oral TID Malvin Johns, MD   25 mg at 09/21/18 0759   Followed by  . [START ON 09/22/2018] chlordiazePOXIDE (LIBRIUM) capsule 25 mg  25 mg Oral Nicki Guadalajara, MD       Followed by  . [START ON 09/23/2018] chlordiazePOXIDE (LIBRIUM) capsule 25 mg  25 mg Oral Daily Malvin Johns, MD      . cloNIDine (CATAPRES) tablet 0.1 mg  0.1 mg Oral Q4H PRN Malvin Johns, MD      . dicyclomine (BENTYL) tablet 20 mg  20 mg Oral Q6H PRN Antonieta Pert, MD   20 mg at 09/20/18 1618  . folic acid (FOLVITE) tablet 1 mg  1 mg Oral Daily Antonieta Pert, MD   1 mg at 09/21/18 0757  . magnesium hydroxide (MILK OF MAGNESIA) suspension 30 mL  30 mL Oral Daily PRN Nira Conn A, NP   30 mL at 09/20/18 1617  . methocarbamol (ROBAXIN) tablet 1,000 mg  1,000 mg Oral TID Malvin Johns, MD   1,000 mg at 09/21/18 0758  . multivitamin with minerals tablet 1 tablet  1 tablet Oral Daily Nira Conn A, NP   1 tablet at 09/21/18 0758  . naproxen (NAPROSYN) tablet 500 mg  500 mg Oral BID PRN Antonieta Pert, MD   500 mg at 09/20/18 2104  . nicotine (NICODERM CQ - dosed in mg/24 hours) patch 21 mg  21 mg Transdermal Daily Oneta Rack, NP   21 mg at 09/21/18 0757  . ondansetron (ZOFRAN-ODT) disintegrating tablet 4 mg  4 mg Oral Q6H PRN Malvin Johns, MD      . polyethylene glycol (MIRALAX / GLYCOLAX) packet 17 g  17 g Oral Daily PRN Nira Conn A, NP      . sertraline (ZOLOFT) tablet 25 mg  25 mg Oral Daily Antonieta Pert, MD   25 mg at 09/21/18 0758  . thiamine (VITAMIN B-1) tablet 100 mg  100 mg Oral Daily Nira Conn A, NP   100 mg at 09/21/18 0758  . traZODone (DESYREL) tablet 50 mg  50 mg Oral QHS PRN,MR X 1 Nira Conn A, NP   50 mg at 09/20/18 2157    Lab Results: No results found for this or any previous visit (from the past 48 hour(s)).  Blood Alcohol level:  Lab Results  Component Value Date   ETH <10 09/17/2018   ETH 13 (H) 02/27/2018    Metabolic Disorder  Labs: No results found for: HGBA1C, MPG  No results found for: PROLACTIN No results found for: CHOL, TRIG, HDL, CHOLHDL, VLDL, LDLCALC  Physical Findings: AIMS: Facial and Oral Movements Muscles of Facial Expression: None, normal Lips and Perioral Area: None, normal Jaw: None, normal Tongue: None, normal,Extremity Movements Upper (arms, wrists, hands, fingers): None, normal Lower (legs, knees, ankles, toes): None, normal, Trunk Movements Neck, shoulders, hips: None, normal, Overall Severity Severity of abnormal movements (highest score from questions above): None, normal Incapacitation due to abnormal movements: None, normal Patient's awareness of abnormal movements (rate only patient's report): No Awareness, Dental Status Current problems with teeth and/or dentures?: No Does patient usually wear dentures?: No  CIWA:  CIWA-Ar Total: 10 COWS:  COWS Total Score: 8  Musculoskeletal: Strength & Muscle Tone: within normal limits Gait & Station: normal Patient leans: N/A  Psychiatric Specialty Exam: Physical Exam  ROS  Blood pressure 128/86, pulse 78, temperature 98.6 F (37 C), temperature source Oral, resp. rate 18, height 5\' 1"  (1.549 m), weight 114.8 kg, last menstrual period 09/18/2018, SpO2 100 %.Body mass index is 47.8 kg/m.  General Appearance: Casual  Eye Contact:  Fair  Speech:  Clear and Coherent  Volume:  Normal  Mood:  Dysphoric  Affect:  Congruent  Thought Process:  Coherent  Orientation:  Full (Time, Place, and Person)  Thought Content:  Logical  Suicidal Thoughts:  No  Homicidal Thoughts:  No  Memory:  Immediate;   Fair  Judgement:  Fair  Insight:  Fair  Psychomotor Activity:  Normal  Concentration:  Concentration: Fair  Recall:  [employer]Fair  Fund of Knowledge:  Fair  Language:  Fair  Akathisia:  Negative  Handed:  Right  AIMS (if indicated):     Assets:  Resilience Social Support  ADL's:  Intact  Cognition:  WNL  Sleep:  Number of Hours: 6.5      Treatment Plan Summary: Daily contact with patient to assess and evaluate symptoms and progress in treatment, Medication management and Plan Continue current detox regimen probable [facility]  Darly Massi, MD 09/21/2018, 8:59 AM

## 2018-09-21 NOTE — Progress Notes (Signed)
D:Pt reports that she is improving with less detox symptoms. She reports mild nausea, aches and sweaty this morning. Pt says that she has been accepted at an [facility] and has a bed holding when she is discharged. A:Offered support, encouragement and 15 minute checks. R:Pt denies si and hi. Safety maintained on the unit.

## 2018-09-21 NOTE — Progress Notes (Signed)
Recreation Therapy Notes  Animal-Assisted Activity (AAA) Program Checklist/Progress Notes Patient Eligibility Criteria Checklist & Daily Group note for Rec Tx Intervention  Date: 2.4.20 Time: 1430 Location: 400 Hall Dayroom   AAA/T Program Assumption of Risk Form signed by Patient/ or Parent Legal Guardian  YES   Patient is free of allergies or sever asthma   YES   Patient reports no fear of animals  YES   Patient reports no history of cruelty to animals  YES   Patient understands his/her participation is voluntary  YES   Patient washes hands before animal contact  YES   Patient washes hands after animal contact  YES   Education: Hand Washing, Appropriate Animal Interaction   Education Outcome: Acknowledges understanding/In group clarification offered/Needs additional education.   Clinical Observations/Feedback: Pt did not attend group.    Shirley Murphy, LRT/CTRS         Shirley Murphy 09/21/2018 3:50 PM 

## 2018-09-21 NOTE — Progress Notes (Signed)
Adult Psychoeducational Group Note  Date:  09/21/2018 Time:  9:28 PM  Group Topic/Focus:  Wrap-Up Group:   The focus of this group is to help patients review their daily goal of treatment and discuss progress on daily workbooks.  Participation Level: Active  Participation Quality:  Appropriate  Affect:  Appropriate  Cognitive:  Appropriate  Insight: Appropriate  Engagement in Group:  Engaged  Modes of Intervention:  Discussion  Additional Comments:  Pt stated her goal was to feel better.  Pt stated she did meet her goal.  Pt rated the day at a 7/10.  Shirley Murphy 09/21/2018, 9:28 PM

## 2018-09-21 NOTE — Progress Notes (Signed)
D: Patient observed still isolative to room however comes to NS when needing something. Patient continues to state she feels poorly physically. Patient's affect anxious, mood congruent. Complains of headache of a 9/10, restlessness, sneezing, mild nausea, some sweating, anxiety.   A: Medicated per orders, prn trazadone and vistaril given for complaints. Medication education provided. Level III obs in place for safety. Emotional support offered. Patient encouraged to complete Suicide Safety Plan before discharge. Encouraged to attend and participate in unit programming.  Fall prevention plan in place and reviewed with patient as pt is a high fall risk due to detox.   R: Patient verbalizes understanding of POC, falls prevention education. On reassess, patient is asleep. Patient denies SI/HI/AVH and remains safe on level III obs. Will continue to monitor throughout the night.

## 2018-09-22 MED ORDER — NICOTINE 21 MG/24HR TD PT24: 21.0000 mg | MEDICATED_PATCH | Freq: Every day | TRANSDERMAL | 0 refills | Status: DC

## 2018-09-22 MED ORDER — TRAZODONE HCL 50 MG PO TABS: 50.0000 mg | ORAL_TABLET | Freq: Every evening | ORAL | 0 refills | Status: DC | PRN

## 2018-09-22 MED ORDER — NAPROXEN 500 MG PO TABS: 500.0000 mg | ORAL_TABLET | Freq: Two times a day (BID) | ORAL | 0 refills | Status: DC | PRN

## 2018-09-22 NOTE — Progress Notes (Signed)
Recreation Therapy Notes  Date:  2.5.Marland Kitchen20 Time: 0930 Location: 300 Hall Dayroom  Group Topic: Stress Management  Goal Area(s) Addresses:  Patient will identify positive stress management techniques. Patient will identify benefits of using stress management post d/c.  Behavioral Response:  Engaged  Intervention:  Stress Management  Activity :  Meditation.  LRT played a meditation focused on gratitude.  Patients were to follow along with meditation as it played to engage in the activity.  Education:  Stress Management, Discharge Planning.   Education Outcome: Acknowledges Education  Clinical Observations/Feedback:  Pt attended and participated in group.    Caroll Rancher, LRT/CTRS         Caroll Rancher A 09/22/2018 10:42 AM
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
## 2018-09-22 NOTE — [facility] Suicide Risk Assessment (Signed)
[facility]   Principal Problem: Severe recurrent major depression without psychotic features [facility](HCC) Discharge Diagnoses: Principal Problem:   Severe recurrent major depression without psychotic features (HCC)   Total Time spent with patient: 45 minutes  Currently alert oriented cooperative eager for rehab states she would like to be on no medications at the point of discharge no thoughts of harming self or others contracting fully  Mental Status Per Nursing Assessment::   On Admission:  Self-harm thoughts, Suicidal ideation indicated by patient  Demographic Factors:  Caucasian  Loss Factors: Decrease in vocational status  Historical Factors: NA  Risk Reduction Factors:   Positive therapeutic relationship  Continued Clinical Symptoms:  Alcohol/Substance Abuse/Dependencies  Cognitive Features That Contribute To Risk:  None    Suicide Risk:  Minimal: No identifiable suicidal ideation.  Patients presenting with no risk factors but with morbid ruminations; may be classified as minimal risk based on the severity of the depressive symptoms  Follow-up Information    [facility] Follow up on 09/28/2018.   Why:  Hospital follow up appointment is 2/11 at 8:30a. Please bring your photo ID, proof of insurance, SSN, current medications, and discharge paperwork from this hospitalization.  Contact information: [address] [place] [address] [phone]           Plan Of Care/Follow-up recommendations:  Activity:  full  Jerone Cudmore, MD 09/22/2018, 8:19 AM

## 2018-09-22 NOTE — Discharge Summary (Signed)
Physician Discharge Summary Note  Patient:  Shirley Murphy is an 33 y.o., female MRN:  [id number] DOB:  August 23, 1985 Patient phone:  [phone] (home)  Patient address:   [address] [place] [address],  Total Time spent with patient: 15 minutes  Date of Admission:  09/18/2018 Date of Discharge: 09/22/2018  Reason for Admission: suicidal ideation  Principal Problem: Severe recurrent major depression without psychotic features [facility]) Discharge Diagnoses: Principal Problem:   Severe recurrent major depression without psychotic features [facility])   Past Psychiatric History: From admission H&P: Multiple psychiatric admissions since she was a teenager as above, had recently been admitted to [facility] in  July 2019 for depression/substance abuse . Reports history of suicide attempts by overdose and by trying to hang self in the past . Reports fleeting hallucinations at times during past  detoxifications from alcohol, but states no recent psychotic symptoms. History of PTSD but reports partial improvement overtime .Reports prior history of brief mood swings, mood instability, and states she has been diagnosed with Bipolar Disorder as a teenager. She states that her main issue is  long history of depression.   Past Medical History:  Past Medical History:  Diagnosis Date  . Anxiety   . Chest pain   . Depression   . Gallstones   . Hepatitis C   . Hypertension    patient reports only during detox  . Irregular heart beat     Past Surgical History:  Procedure Laterality Date  . c sections    . TUBAL LIGATION     Family History:  Family History  Problem Relation Age of Onset  . Hypertension Father   . Pancreatic cancer Father    Family Psychiatric  History: Per admission H&P: Father, sister  had history of alcohol dependence. One paternal uncle uncle committed suicide, states father attempted suicide . Social History:  Social History   Substance and Sexual Activity  Alcohol  Use Yes  . Alcohol/week: 12.0 standard drinks  . Types: 12 Cans of beer per week   Comment: daily use recently     Social History   Substance and Sexual Activity  Drug Use Yes  . Types: Cocaine, Heroin   Comment: daily use of cocaine and heroin    Social History   Socioeconomic History  . Marital status: Legally Separated    Spouse name: Not on file  . Number of children: Not on file  . Years of education: Not on file  . Highest education level: Not on file  Occupational History  . Not on file  Social Needs  . Financial resource strain: Not on file  . Food insecurity:    Worry: Not on file    Inability: Not on file  . Transportation needs:    Medical: Not on file    Non-medical: Not on file  Tobacco Use  . Smoking status: Current Every Day Smoker    Packs/day: 1.00    Types: Cigarettes  . Smokeless tobacco: Never Used  Substance and Sexual Activity  . Alcohol use: Yes    Alcohol/week: 12.0 standard drinks    Types: 12 Cans of beer per week    Comment: daily use recently  . Drug use: Yes    Types: Cocaine, Heroin    Comment: daily use of cocaine and heroin  . Sexual activity: Yes    Birth control/protection: None  Lifestyle  . Physical activity:    Days per week: Not on file    Minutes  per session: Not on file  . Stress: Not on file  Relationships  . Social connections:    Talks on phone: Not on file    Gets together: Not on file    Attends religious service: Not on file    Active member of club or organization: Not on file    Attends meetings of clubs or organizations: Not on file    Relationship status: Not on file  Other Topics Concern  . Not on file  Social History Narrative  . Not on file    Hospital Course:  From admission SRA 09/18/2018: Patient is a 33 year old female with a past psychiatric history significant for major depression, alcohol dependence, heroin dependence as well as hepatitis C who presented to the [facility]  emergency department on 09/17/2018 with suicidal ideation.  The patient had her last psychiatric at [facility] on 07/06/2018.  At that time the patient had been living in an [place] house, got very depressed, wanted to drink, but used cocaine.  Her last psychiatric hospitalization at our facility was in July 2019.  At that time she reported a history of posttraumatic stress disorder, and polysubstance use disorders.  She was discharged on Campral, Abilify, gabapentin, hydroxyzine, prazosin, levothyroxine, sertraline and trazodone.  Her dosage of sertraline when she was discharged from [facility] was 100 mg a day.  Patient stated she had been sober for 3 months and was living in a sober living house approximately 2-1/2 months ago.  She left that environment, and relapsed, and has been on heroin, cocaine, alcohol since that point.  She stated she has been using all 3 substances on a daily basis.  She is been using approximately a gram of heroin, 1.5 g of cocaine and 12 beers a day.  The patient stated she had attempted to get back into the [facility] and self detox, but was unable to do so because of withdrawal symptoms.  She also claimed to have suicidal ideation with a plan to overdose.  She is had previous behavioral health hospital admissions as well as admissions to [facility] and [facility].  She has been staying with her sister who also is an addict.   Shirley Murphy was admitted for suicidal ideation in the context of recent relapse on heroin, cocaine, ETOH. She detoxed with CIWA and COWS protocols. She reported history of stable mood during periods of sobriety. She remained on the [facility] unit for 5 days. She stabilized with medication and therapy. Patient was discharged on the medications listed below. She has shown improvement with improved mood, affect, sleep, appetite, and interaction. She denies any SI/HI/AVH and contracts for safety. Patient agrees to follow up at [facility]. She is  discharging to [employer]. Patient is provided with prescriptions for their medications upon discharge.  Physical Findings: AIMS: Facial and Oral Movements Muscles of Facial Expression: None, normal Lips and Perioral Area: None, normal Jaw: None, normal Tongue: None, normal,Extremity Movements Upper (arms, wrists, hands, fingers): None, normal Lower (legs, knees, ankles, toes): None, normal, Trunk Movements Neck, shoulders, hips: None, normal, Overall Severity Severity of abnormal movements (highest score from questions above): None, normal Incapacitation due to abnormal movements: None, normal Patient's awareness of abnormal movements (rate only patient's report): No Awareness, Dental Status Current problems with teeth and/or dentures?: No Does patient usually wear dentures?: No  CIWA:  CIWA-Ar Total: 5 COWS:  COWS Total Score: 4  Musculoskeletal: Strength & Muscle Tone: within normal limits  Gait & Station: normal Patient leans: N/A  Psychiatric Specialty Exam: Physical Exam  Nursing note and vitals reviewed. Constitutional: She is oriented to person, place, and time. She appears well-developed and well-nourished.  Cardiovascular: Normal rate.  Respiratory: Effort normal.  Neurological: She is alert and oriented to person, place, and time.    Review of Systems  Constitutional: Negative.   Psychiatric/Behavioral: Positive for substance abuse (hx heroin, cocaine, ETOH). Negative for depression, hallucinations, memory loss and suicidal ideas. The patient is not nervous/anxious and does not have insomnia.     Blood pressure 120/69, pulse 75, temperature 98.7 F (37.1 C), resp. rate 20, height 5\' 1"  (1.549 m), weight 114.8 kg, last menstrual period 09/18/2018, SpO2 98 %.Body mass index is 47.8 kg/m.  General Appearance: Well Groomed  Eye Contact:  Fair  Speech:  Clear and Coherent and Normal Rate  Volume:  Normal  Mood:  Euthymic  Affect:  Congruent and Full Range  Thought  Process:  Coherent  Orientation:  Full (Time, Place, and Person)  Thought Content:  WDL  Suicidal Thoughts:  No  Homicidal Thoughts:  No  Memory:  Immediate;   Good Recent;   Good  Judgement:  Good  Insight:  Fair  Psychomotor Activity:  Normal  Concentration:  Concentration: Good  Recall:  Good  Fund of Knowledge:  Fair  Language:  Good  Akathisia:  No  Handed:  Right  AIMS (if indicated):     Assets:  Communication Skills Desire for Improvement Housing Resilience Social Support  ADL's:  Intact  Cognition:  WNL  Sleep:  Number of Hours: 5.75     Have you used any form of tobacco in the last 30 days? (Cigarettes, Smokeless Tobacco, Cigars, and/or Pipes): Yes  Has this patient used any form of tobacco in the last 30 days? (Cigarettes, Smokeless Tobacco, Cigars, and/or Pipes) Yes, a prescription for an FDA-approved medication for tobacco cessation was offered at discharge.   Blood Alcohol level:  Lab Results  Component Value Date   ETH <10 09/17/2018   ETH 13 (H) 02/27/2018    Metabolic Disorder Labs:  No results found for: HGBA1C, MPG No results found for: PROLACTIN No results found for: CHOL, TRIG, HDL, CHOLHDL, VLDL, LDLCALC  See Psychiatric Specialty Exam and Suicide Risk Assessment completed by Attending Physician prior to discharge.  Discharge destination:  Other:  [facility]  Is patient on multiple antipsychotic therapies at discharge:  No   Has Patient had three or more failed trials of antipsychotic monotherapy by history:  No  Recommended Plan for Multiple Antipsychotic Therapies: NA  Discharge Instructions    Discharge instructions   Complete by:  As directed    Patient is instructed to take all prescribed medications as recommended. Report any side effects or adverse reactions to your outpatient psychiatrist. Patient is instructed to abstain from alcohol and illegal drugs while on prescription medications. In the event of worsening symptoms,  patient is instructed to call the crisis hotline, 911, or go to the nearest emergency department for evaluation and treatment.     Allergies as of 09/22/2018      Reactions   Other Other (See Comments)   benzoyl peroxide - used topically for acne cause facial and tongue swelling      Medication List    STOP taking these medications   alum & mag hydroxide-simeth 200-200-20 MG/5ML suspension Commonly known as:  MAALOX/MYLANTA   ARIPiprazole 5 MG tablet Commonly known as:  ABILIFY  Biotin 2500 MCG Caps   famotidine 20 MG tablet Commonly known as:  PEPCID   gabapentin 300 MG capsule Commonly known as:  NEURONTIN   ibuprofen 200 MG tablet Commonly known as:  ADVIL,MOTRIN   levothyroxine 25 MCG tablet Commonly known as:  SYNTHROID, LEVOTHROID   ondansetron 4 MG tablet Commonly known as:  ZOFRAN   prazosin 1 MG capsule Commonly known as:  MINIPRESS   sertraline 50 MG tablet Commonly known as:  ZOLOFT     TAKE these medications     Indication  multivitamin with minerals tablet Take 1 tablet by mouth daily.  Indication:  Supplementation   naproxen 500 MG tablet Commonly known as:  NAPROSYN Take 1 tablet (500 mg total) by mouth 2 (two) times daily as needed (aching, pain, or discomfort).  Indication:  Pain   nicotine 21 mg/24hr patch Commonly known as:  NICODERM CQ - dosed in mg/24 hours Place 1 patch (21 mg total) onto the skin daily. For smoking cessation What changed:  additional instructions  Indication:  Nicotine Addiction   traZODone 50 MG tablet Commonly known as:  DESYREL Take 1 tablet (50 mg total) by mouth at bedtime as needed and may repeat dose one time if needed for sleep. What changed:    medication strength  how much to take  when to take this  Indication:  Trouble Sleeping      Follow-up Information    [facility] Follow up on 09/28/2018.   Why:  Hospital follow up appointment is 2/11 at 8:30a. Please bring your photo ID, proof of insurance,  SSN, current medications, and discharge paperwork from this hospitalization.    If you are unable to attend this appointment, please call and reschedule.  Contact information: [address] [place] [id number] [phone]           Follow-up recommendations: Activity as tolerated. Diet as recommended by primary care physician. Keep all scheduled follow-up appointments as recommended.   Comments:   Patient is instructed to take all prescribed medications as recommended. Report any side effects or adverse reactions to your outpatient psychiatrist. Patient is instructed to abstain from alcohol and illegal drugs while on prescription medications. In the event of worsening symptoms, patient is instructed to call the crisis hotline, 911, or go to the nearest emergency department for evaluation and treatment.  Signed: Aldean BakerJanet E Takhia Spoon, NP 09/22/2018, 9:28 AM

## 2018-09-22 NOTE — Progress Notes (Signed)
  [facility] Adult Case Management Discharge Plan :  Will you be returning to the same living situation after discharge:  No. Going to an [employer]. At discharge, do you have transportation home?: Yes,  bus pass Do you have the ability to pay for your medications: Yes,  [place] [employer], referred to [place].  Release of information consent forms completed and in the chart. Patient to Follow up at: Follow-up Information    [facility] Follow up on 09/28/2018.   Why:  Hospital follow up appointment is 2/11 at 8:30a. Please bring your photo ID, proof of insurance, SSN, current medications, and discharge paperwork from this hospitalization.  Contact information: [address] [place] [address] [phone]           Next level of care provider has access to [facility] Link:no  Safety Planning and Suicide Prevention discussed: Yes,  with patient  Have you used any form of tobacco in the last 30 days? (Cigarettes, Smokeless Tobacco, Cigars, and/or Pipes): Yes  Has patient been referred to the Quitline?: Patient refused referral  Patient has been referred for addiction treatment: Yes  Darreld Mclean, LCSWA 09/22/2018, 8:34 AM

## 2018-09-22 NOTE — Progress Notes (Signed)
Pt completed her daily assessment and on this she wrote she deneid Suicidal ideation today and she rated her depression, hopelessness and  anxiety " 2/1/7", respectively. DC teaching is completed by this Clinical research associate, pt verbalizes understanding and willingness to comply and all belongings are returned to pt ( including her $100.00 cash) . She is given cc dc instructions ( SRA, AVS, SSP and transition reocrd)..and escorted to bldg entance.

## 2018-11-13 ENCOUNTER — Emergency Department (HOSPITAL_COMMUNITY)
Admission: EM | Admit: 2018-11-13 | Discharge: 2018-11-13 | Disposition: A | Discharge status: Home / Self Care | Payer: Self-pay | Attending: Emergency Medicine | Admitting: Emergency Medicine

## 2018-11-13 ENCOUNTER — Other Ambulatory Visit: Payer: Self-pay

## 2018-11-13 ENCOUNTER — Encounter (HOSPITAL_COMMUNITY): Payer: Self-pay | Admitting: Emergency Medicine

## 2018-11-13 DIAGNOSIS — Z79899 Other long term (current) drug therapy: Secondary | ICD-10-CM | POA: Insufficient documentation

## 2018-11-13 DIAGNOSIS — F17210 Nicotine dependence, cigarettes, uncomplicated: Secondary | ICD-10-CM | POA: Insufficient documentation

## 2018-11-13 DIAGNOSIS — R112 Nausea with vomiting, unspecified: Secondary | ICD-10-CM | POA: Insufficient documentation

## 2018-11-13 DIAGNOSIS — R42 Dizziness and giddiness: Secondary | ICD-10-CM | POA: Insufficient documentation

## 2018-11-13 DIAGNOSIS — D649 Anemia, unspecified: Secondary | ICD-10-CM | POA: Insufficient documentation

## 2018-11-13 LAB — CBC
HCT: 34.4 % — ABNORMAL LOW (ref 36.0–46.0)
Hemoglobin: 9.4 g/dL — ABNORMAL LOW (ref 12.0–15.0)
MCH: 20.5 pg — ABNORMAL LOW (ref 26.0–34.0)
MCHC: 27.3 g/dL — ABNORMAL LOW (ref 30.0–36.0)
MCV: 75.1 fL — ABNORMAL LOW (ref 80.0–100.0)
NRBC: 0 % (ref 0.0–0.2)
Platelets: 277 10*3/uL (ref 150–400)
RBC: 4.58 MIL/uL (ref 3.87–5.11)
RDW: 17.9 % — AB (ref 11.5–15.5)
WBC: 5.1 10*3/uL (ref 4.0–10.5)

## 2018-11-13 LAB — COMPREHENSIVE METABOLIC PANEL
ALT: 18 U/L (ref 0–44)
AST: 18 U/L (ref 15–41)
Albumin: 3.6 g/dL (ref 3.5–5.0)
Alkaline Phosphatase: 45 U/L (ref 38–126)
Anion gap: 8 (ref 5–15)
BUN: 17 mg/dL (ref 6–20)
CO2: 20 mmol/L — ABNORMAL LOW (ref 22–32)
Calcium: 8.9 mg/dL (ref 8.9–10.3)
Chloride: 104 mmol/L (ref 98–111)
Creatinine, Ser: 0.89 mg/dL (ref 0.44–1.00)
GFR calc Af Amer: >60 mL/min (ref >60)
GFR calc non Af Amer: >60 mL/min (ref >60)
Glucose, Bld: 85 mg/dL (ref 70–99)
Potassium: 4.8 mmol/L (ref 3.5–5.1)
Sodium: 132 mmol/L — ABNORMAL LOW (ref 135–145)
Total Bilirubin: 0.4 mg/dL (ref 0.3–1.2)
Total Protein: 6.8 g/dL (ref 6.5–8.1)

## 2018-11-13 LAB — RAPID URINE DRUG SCREEN, HOSP PERFORMED
AMPHETAMINES: NOT DETECTED
Barbiturates: NOT DETECTED
Benzodiazepines: NOT DETECTED
Cocaine: POSITIVE — AB
OPIATES: NOT DETECTED
Tetrahydrocannabinol: NOT DETECTED

## 2018-11-13 MED ORDER — SODIUM CHLORIDE 0.9 % IV BOLUS
1000.0000 mL | Freq: Once | INTRAVENOUS | Status: AC
  Administered 2018-11-13: 1000 mL via INTRAVENOUS

## 2018-11-13 MED ORDER — ONDANSETRON 8 MG PO TBDP: 8.0000 mg | ORAL_TABLET | Freq: Three times a day (TID) | ORAL | 0 refills | Status: DC | PRN

## 2018-11-13 NOTE — ED Provider Notes (Signed)
[facility] EMERGENCY DEPARTMENT Provider Note   CSN: [id number] Arrival date & time: 11/13/18  1116    History   Chief Complaint Chief Complaint  Patient presents with  . Nausea  . Emesis    HPI Shirley Murphy is a 33 y.o. female.      HPI  33 year old female history of hep C, anxiety presents today.  She has had nausea and vomiting for the past 3 days.  She vomited multiple times each of the past several days.  She has not vomited today and has been keeping some ginger ale down.  She has felt generally weak and lightheaded.  She presents today due to the lightheadedness.  She has had some subjective fever and chills.  She has not had any diarrhea or abdominal pain she has had some nasal congestion and mild cough.  Cover with exposures and has not traveled to any hotspots. She has regular periods and has had a bilateral tubal ligation. Patient admits to cocaine use several nights ago.  Past Medical History:  Diagnosis Date  . Anxiety   . Chest pain   . Depression   . Gallstones   . Hepatitis C   . Hypertension    patient reports only during detox  . Irregular heart beat     Patient Active Problem List   Diagnosis Date Noted  . Acute hepatitis 08/13/2018  . Nausea & vomiting 08/13/2018  . Overdose 02/28/2018  . Drug overdose 02/28/2018  . Severe recurrent major depression without psychotic features (HCC) 02/24/2018  . Chronic hepatitis C without hepatic coma (HCC) 02/23/2018  . Alcohol dependence with alcohol-induced mood disorder (HCC) 02/02/2018  . MDD (major depressive disorder), recurrent severe, without psychosis (HCC) 02/01/2018  . Moderate cocaine use disorder (HCC) 06/12/2017  . Alcohol abuse with alcohol-induced mood disorder (HCC) 06/12/2017  . Substance induced mood disorder (HCC) 03/04/2017  . MDD (major depressive disorder), recurrent episode (HCC) 03/02/2017  . Suicide attempt (HCC) 10/15/2016  . HTN (hypertension), benign  10/15/2016  . Irregular heart beat 10/15/2016  . Suicidal ideation 10/15/2016  . Alcoholic intoxication without complication [facility])     Past Surgical History:  Procedure Laterality Date  . c sections    . TUBAL LIGATION       OB History   No obstetric history on file.      Home Medications    Prior to Admission medications   Medication Sig Start Date End Date Taking? Authorizing Provider  Multiple Vitamins-Minerals (MULTIVITAMIN WITH MINERALS) tablet Take 1 tablet by mouth daily.    [provider]  naproxen (NAPROSYN) 500 MG tablet Take 1 tablet (500 mg total) by mouth 2 (two) times daily as needed (aching, pain, or discomfort). 09/22/18   Aldean Baker, NP  nicotine (NICODERM CQ - DOSED IN MG/24 HOURS) 21 mg/24hr patch Place 1 patch (21 mg total) onto the skin daily. For smoking cessation 09/22/18   Aldean Baker, NP  traZODone (DESYREL) 50 MG tablet Take 1 tablet (50 mg total) by mouth at bedtime as needed and may repeat dose one time if needed for sleep. 09/22/18   Aldean Baker, NP    Family History Family History  Problem Relation Age of Onset  . Hypertension Father   . Pancreatic cancer Father     Social History Social History   Tobacco Use  . Smoking status: Current Every Day Smoker    Packs/day: 1.00    Types: Cigarettes  . Smokeless tobacco:  Never Used  Substance Use Topics  . Alcohol use: Yes    Alcohol/week: 12.0 standard drinks    Types: 12 Cans of beer per week    Comment: daily use recently  . Drug use: Yes    Types: Cocaine, Heroin    Comment: daily use of cocaine and heroin     Allergies   Other   Review of Systems Review of Systems  All other systems reviewed and are negative.    Physical Exam Updated Vital Signs BP (!) 128/91   Pulse 75   Temp 98.2 F (36.8 C) (Oral)   Resp 14   LMP 11/13/2018   SpO2 98%   Physical Exam Vitals signs and nursing note reviewed.  Constitutional:      Appearance: She is obese.  HENT:      Head: Normocephalic and atraumatic.     Right Ear: External ear normal.     Left Ear: External ear normal.     Nose: Nose normal.     Mouth/Throat:     Mouth: Mucous membranes are moist.     Pharynx: Oropharynx is clear.  Eyes:     Pupils: Pupils are equal, round, and reactive to light.  Neck:     Musculoskeletal: Normal range of motion.  Cardiovascular:     Rate and Rhythm: Normal rate and regular rhythm.     Pulses: Normal pulses.  Pulmonary:     Effort: Pulmonary effort is normal. No respiratory distress.     Breath sounds: No wheezing or rhonchi.  Abdominal:     General: Abdomen is flat.     Palpations: Abdomen is soft.  Skin:    Capillary Refill: Capillary refill takes less than 2 seconds.  Neurological:     General: No focal deficit present.     Mental Status: She is alert.      ED Treatments / Results  Labs (all labs ordered are listed, but only abnormal results are displayed) Labs Reviewed  CBC - Abnormal; Notable for the following components:      Result Value   Hemoglobin 9.4 (*)    HCT 34.4 (*)    MCV 75.1 (*)    MCH 20.5 (*)    MCHC 27.3 (*)    RDW 17.9 (*)    All other components within normal limits  COMPREHENSIVE METABOLIC PANEL - Abnormal; Notable for the following components:   Sodium 132 (*)    CO2 20 (*)    All other components within normal limits  RAPID URINE DRUG SCREEN, HOSP PERFORMED - Abnormal; Notable for the following components:   Cocaine POSITIVE (*)    All other components within normal limits    EKG EKG Interpretation  Date/Time:  Saturday November 13 2018 11:40:40 EDT Ventricular Rate:  66 PR Interval:    QRS Duration: 104 QT Interval:  392 QTC Calculation: 411 R Axis:   30 Text Interpretation:  Sinus rhythm Borderline short PR interval RSR' in V1 or V2, right VCD or RVH Confirmed by Margarita Grizzle [phone]) on 11/13/2018 1:44:11 PM   Radiology No results found.  Procedures Procedures (including critical care time)   Medications Ordered in ED Medications  sodium chloride 0.9 % bolus 1,000 mL (1,000 mLs Intravenous New Bag/Given 11/13/18 1222)     Initial Impression / Assessment and Plan / ED Course  I have reviewed the triage vital signs and the nursing notes.  Pertinent labs & imaging results that were available during my care of the  patient were reviewed by me and considered in my medical decision making (see chart for details).        33 yo female with ho substance abuse disorder presents today with nausea and vomiting with associated lightheadedness. Vitals:   11/13/18 1128 11/13/18 1130  BP: (!) 140/98 (!) 128/91  Pulse: 76 75  Resp: 16 14  Temp: 98.2 F (36.8 C)   SpO2: 100% 98%   Labs Patient is anemic but it is stable actually improved from previous hemoglobin Patient's orthostatics checked and patient blood pressure went up with standing.  She received IV fluids.  She has not vomited here and has tolerated p.o.  She appears stable for discharge.  Final Clinical Impressions(s) / ED Diagnoses   Final diagnoses:  Non-intractable vomiting with nausea, unspecified vomiting type  Anemia, unspecified type  Lightheadedness    ED Discharge Orders    None       Margarita Grizzle, MD 11/13/18 1354

## 2018-11-13 NOTE — ED Notes (Signed)
Pt verbalized understanding of d/c instructions and has no further questions, VSS, NAD.  

## 2018-11-13 NOTE — Discharge Instructions (Addendum)
Please consult her pharmacist regarding oral iron therapy.  You are anemic but has improved from prior.  Please discuss with your physician if you should be continued on ibuprofen. Please follow-up with your mental health providers to assist with your ongoing recovery from substance abuse A prescription for Zofran for nausea has been sent to the [facility] pharmacy on [facility].  Please use these with any ongoing nausea and vomiting.  Please use clear liquids and advance your diet as you tolerate

## 2018-12-04 ENCOUNTER — Emergency Department (HOSPITAL_COMMUNITY)
Admission: EM | Admit: 2018-12-04 | Discharge: 2018-12-05 | Disposition: A | Discharge status: Other Acute Inpatient Hospital | Payer: Self-pay | Attending: Emergency Medicine | Admitting: Emergency Medicine

## 2018-12-04 ENCOUNTER — Other Ambulatory Visit: Payer: Self-pay

## 2018-12-04 DIAGNOSIS — F1020 Alcohol dependence, uncomplicated: Secondary | ICD-10-CM | POA: Insufficient documentation

## 2018-12-04 DIAGNOSIS — F333 Major depressive disorder, recurrent, severe with psychotic symptoms: Secondary | ICD-10-CM | POA: Insufficient documentation

## 2018-12-04 DIAGNOSIS — R45850 Homicidal ideations: Secondary | ICD-10-CM

## 2018-12-04 DIAGNOSIS — Z79899 Other long term (current) drug therapy: Secondary | ICD-10-CM | POA: Insufficient documentation

## 2018-12-04 DIAGNOSIS — I10 Essential (primary) hypertension: Secondary | ICD-10-CM | POA: Insufficient documentation

## 2018-12-04 DIAGNOSIS — R45851 Suicidal ideations: Secondary | ICD-10-CM | POA: Insufficient documentation

## 2018-12-04 DIAGNOSIS — F17210 Nicotine dependence, cigarettes, uncomplicated: Secondary | ICD-10-CM | POA: Insufficient documentation

## 2018-12-04 LAB — RAPID URINE DRUG SCREEN, HOSP PERFORMED
Amphetamines: NOT DETECTED
Barbiturates: NOT DETECTED
Benzodiazepines: NOT DETECTED
Cocaine: POSITIVE — AB
Opiates: NOT DETECTED
Tetrahydrocannabinol: NOT DETECTED

## 2018-12-04 MED ORDER — THIAMINE HCL 100 MG/ML IJ SOLN: 100.0000 mg | Freq: Every day | INTRAMUSCULAR | Status: DC

## 2018-12-04 MED ORDER — ACETAMINOPHEN 325 MG PO TABS: 650.0000 mg | ORAL_TABLET | ORAL | Status: DC | PRN

## 2018-12-04 MED ORDER — GABAPENTIN 300 MG PO CAPS
300.0000 mg | ORAL_CAPSULE | Freq: Three times a day (TID) | ORAL | Status: DC
  Administered 2018-12-04 – 2018-12-05 (×2): 300 mg via ORAL
  Filled 2018-12-04 (×2): qty 1

## 2018-12-04 MED ORDER — ALUM & MAG HYDROXIDE-SIMETH 200-200-20 MG/5ML PO SUSP: 30.0000 mL | Freq: Four times a day (QID) | ORAL | Status: DC | PRN

## 2018-12-04 MED ORDER — LORAZEPAM 2 MG/ML IJ SOLN: 0.0000 mg | Freq: Two times a day (BID) | INTRAMUSCULAR | Status: DC

## 2018-12-04 MED ORDER — SERTRALINE HCL 100 MG PO TABS
100.0000 mg | ORAL_TABLET | Freq: Every day | ORAL | Status: DC
  Administered 2018-12-05: 100 mg via ORAL
  Filled 2018-12-04: qty 1

## 2018-12-04 MED ORDER — LORAZEPAM 1 MG PO TABS
0.0000 mg | ORAL_TABLET | Freq: Four times a day (QID) | ORAL | Status: DC
  Administered 2018-12-04: 2 mg via ORAL
  Administered 2018-12-05: 1 mg via ORAL
  Filled 2018-12-04: qty 1
  Filled 2018-12-04: qty 2

## 2018-12-04 MED ORDER — TRAZODONE HCL 50 MG PO TABS
50.0000 mg | ORAL_TABLET | Freq: Every evening | ORAL | Status: DC | PRN
  Administered 2018-12-04: 50 mg via ORAL
  Filled 2018-12-04: qty 1

## 2018-12-04 MED ORDER — LORAZEPAM 2 MG/ML IJ SOLN: 0.0000 mg | Freq: Four times a day (QID) | INTRAMUSCULAR | Status: DC

## 2018-12-04 MED ORDER — VITAMIN B-1 100 MG PO TABS
100.0000 mg | ORAL_TABLET | Freq: Every day | ORAL | Status: DC
  Administered 2018-12-04 – 2018-12-05 (×2): 100 mg via ORAL
  Filled 2018-12-04 (×2): qty 1

## 2018-12-04 MED ORDER — ONDANSETRON HCL 4 MG PO TABS: 4.0000 mg | ORAL_TABLET | Freq: Three times a day (TID) | ORAL | Status: DC | PRN

## 2018-12-04 MED ORDER — NICOTINE 14 MG/24HR TD PT24
14.0000 mg | MEDICATED_PATCH | Freq: Every day | TRANSDERMAL | Status: DC
  Administered 2018-12-04: 14 mg via TRANSDERMAL
  Filled 2018-12-04: qty 1

## 2018-12-04 MED ORDER — LORAZEPAM 1 MG PO TABS: 0.0000 mg | ORAL_TABLET | Freq: Two times a day (BID) | ORAL | Status: DC

## 2018-12-04 NOTE — ED Triage Notes (Signed)
Pt here for feeling suicidal and homicidial.  States she takes zoloft and missed last appointment 2 weeks ago, has not had any since then.  Pt tearful in triage. Hx of anxiety and depression.
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
## 2018-12-04 NOTE — ED Notes (Signed)
Patient is watching television and eating her tray that was brought to her

## 2018-12-04 NOTE — BH Assessment (Signed)
[facility] Assessment Progress Note    Per Reola Calkins, NP, patient meets inpatient admission criteria.  Patient is being reviewed for admission to [facility]

## 2018-12-04 NOTE — ED Notes (Signed)
Patient  Is resting in bed looking at television. Patient ate 100% of her meal

## 2018-12-04 NOTE — ED Notes (Signed)
Pt given paper scrubs and belongings bags.  Pt changing into scrubs.  All cords removed from room

## 2018-12-04 NOTE — ED Triage Notes (Signed)
TTS in progress 

## 2018-12-04 NOTE — ED Notes (Signed)
Patient is up talking with writer she is complaining of being hot because she is withdrawing
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
## 2018-12-04 NOTE — ED Notes (Addendum)
Called to pts room. Pt states that she is starting to have withdrawal symptoms. "See my hands shake?" States her last drink was at 1600 today. CIWA is 3 currently. Will speak with md.

## 2018-12-05 ENCOUNTER — Inpatient Hospital Stay (HOSPITAL_COMMUNITY)
Admission: AD | Admit: 2018-12-05 | Discharge: 2018-12-10 | DRG: 885 | Disposition: A | Discharge status: Home / Self Care | Payer: No Typology Code available for payment source | Source: Intra-hospital | Attending: Psychiatry | Admitting: Psychiatry

## 2018-12-05 ENCOUNTER — Encounter (HOSPITAL_COMMUNITY): Payer: Self-pay

## 2018-12-05 ENCOUNTER — Other Ambulatory Visit: Payer: Self-pay

## 2018-12-05 DIAGNOSIS — I10 Essential (primary) hypertension: Secondary | ICD-10-CM | POA: Diagnosis present

## 2018-12-05 DIAGNOSIS — F10239 Alcohol dependence with withdrawal, unspecified: Secondary | ICD-10-CM | POA: Diagnosis present

## 2018-12-05 DIAGNOSIS — Z915 Personal history of self-harm: Secondary | ICD-10-CM

## 2018-12-05 DIAGNOSIS — Z800 Family history of malignant neoplasm of digestive organs: Secondary | ICD-10-CM

## 2018-12-05 DIAGNOSIS — F1024 Alcohol dependence with alcohol-induced mood disorder: Secondary | ICD-10-CM

## 2018-12-05 DIAGNOSIS — F333 Major depressive disorder, recurrent, severe with psychotic symptoms: Secondary | ICD-10-CM | POA: Diagnosis present

## 2018-12-05 DIAGNOSIS — F4310 Post-traumatic stress disorder, unspecified: Secondary | ICD-10-CM | POA: Diagnosis present

## 2018-12-05 DIAGNOSIS — F1424 Cocaine dependence with cocaine-induced mood disorder: Secondary | ICD-10-CM

## 2018-12-05 DIAGNOSIS — Z8249 Family history of ischemic heart disease and other diseases of the circulatory system: Secondary | ICD-10-CM

## 2018-12-05 DIAGNOSIS — G4700 Insomnia, unspecified: Secondary | ICD-10-CM | POA: Diagnosis present

## 2018-12-05 DIAGNOSIS — F1410 Cocaine abuse, uncomplicated: Secondary | ICD-10-CM | POA: Diagnosis present

## 2018-12-05 DIAGNOSIS — Z79899 Other long term (current) drug therapy: Secondary | ICD-10-CM | POA: Diagnosis not present

## 2018-12-05 DIAGNOSIS — F17210 Nicotine dependence, cigarettes, uncomplicated: Secondary | ICD-10-CM | POA: Diagnosis present

## 2018-12-05 DIAGNOSIS — F39 Unspecified mood [affective] disorder: Secondary | ICD-10-CM | POA: Diagnosis present

## 2018-12-05 DIAGNOSIS — F319 Bipolar disorder, unspecified: Principal | ICD-10-CM | POA: Diagnosis present

## 2018-12-05 DIAGNOSIS — F1910 Other psychoactive substance abuse, uncomplicated: Secondary | ICD-10-CM

## 2018-12-05 DIAGNOSIS — Z818 Family history of other mental and behavioral disorders: Secondary | ICD-10-CM | POA: Diagnosis not present

## 2018-12-05 DIAGNOSIS — F419 Anxiety disorder, unspecified: Secondary | ICD-10-CM | POA: Diagnosis present

## 2018-12-05 DIAGNOSIS — Z791 Long term (current) use of non-steroidal anti-inflammatories (NSAID): Secondary | ICD-10-CM | POA: Diagnosis not present

## 2018-12-05 HISTORY — DX: Anemia, unspecified: D64.9

## 2018-12-05 MED ORDER — GABAPENTIN 300 MG PO CAPS
300.0000 mg | ORAL_CAPSULE | Freq: Three times a day (TID) | ORAL | Status: DC
  Administered 2018-12-05: 300 mg via ORAL
  Filled 2018-12-05 (×3): qty 1

## 2018-12-05 MED ORDER — VITAMIN B-1 100 MG PO TABS
100.0000 mg | ORAL_TABLET | Freq: Every day | ORAL | Status: DC
  Filled 2018-12-05: qty 1

## 2018-12-05 MED ORDER — VITAMIN B-1 100 MG PO TABS
100.0000 mg | ORAL_TABLET | Freq: Every day | ORAL | Status: DC
  Administered 2018-12-06 – 2018-12-10 (×5): 100 mg via ORAL
  Filled 2018-12-05 (×7): qty 1

## 2018-12-05 MED ORDER — ADULT MULTIVITAMIN W/MINERALS CH
1.0000 | ORAL_TABLET | Freq: Every day | ORAL | Status: DC
  Administered 2018-12-05: 1 via ORAL
  Filled 2018-12-05 (×3): qty 1

## 2018-12-05 MED ORDER — ONDANSETRON 4 MG PO TBDP
4.0000 mg | ORAL_TABLET | Freq: Four times a day (QID) | ORAL | Status: DC | PRN
  Administered 2018-12-05: 4 mg via ORAL
  Filled 2018-12-05: qty 1

## 2018-12-05 MED ORDER — NICOTINE 21 MG/24HR TD PT24
21.0000 mg | MEDICATED_PATCH | Freq: Every day | TRANSDERMAL | Status: DC
  Administered 2018-12-05 – 2018-12-10 (×6): 21 mg via TRANSDERMAL
  Filled 2018-12-05 (×9): qty 1

## 2018-12-05 MED ORDER — HYDROXYZINE HCL 25 MG PO TABS: 25.0000 mg | ORAL_TABLET | Freq: Four times a day (QID) | ORAL | Status: DC | PRN

## 2018-12-05 MED ORDER — LORAZEPAM 1 MG PO TABS
1.0000 mg | ORAL_TABLET | Freq: Four times a day (QID) | ORAL | Status: AC | PRN
  Administered 2018-12-05 – 2018-12-06 (×3): 1 mg via ORAL
  Filled 2018-12-05 (×3): qty 1

## 2018-12-05 MED ORDER — MAGNESIUM HYDROXIDE 400 MG/5ML PO SUSP
30.0000 mL | Freq: Every day | ORAL | Status: DC | PRN
  Administered 2018-12-06: 30 mL via ORAL
  Filled 2018-12-05: qty 30

## 2018-12-05 MED ORDER — HYDROXYZINE HCL 25 MG PO TABS: 25.0000 mg | ORAL_TABLET | Freq: Three times a day (TID) | ORAL | Status: DC | PRN

## 2018-12-05 MED ORDER — ACETAMINOPHEN 325 MG PO TABS
650.0000 mg | ORAL_TABLET | Freq: Four times a day (QID) | ORAL | Status: DC | PRN
  Administered 2018-12-05 – 2018-12-06 (×2): 650 mg via ORAL
  Filled 2018-12-05 (×2): qty 2

## 2018-12-05 MED ORDER — THIAMINE HCL 100 MG/ML IJ SOLN: 100.0000 mg | Freq: Once | INTRAMUSCULAR | Status: DC

## 2018-12-05 MED ORDER — CHLORDIAZEPOXIDE HCL 25 MG PO CAPS
25.0000 mg | ORAL_CAPSULE | Freq: Four times a day (QID) | ORAL | Status: DC | PRN
  Administered 2018-12-05: 25 mg via ORAL
  Filled 2018-12-05: qty 1

## 2018-12-05 MED ORDER — TRAZODONE HCL 50 MG PO TABS
50.0000 mg | ORAL_TABLET | Freq: Every evening | ORAL | Status: DC | PRN
  Administered 2018-12-05 – 2018-12-07 (×3): 50 mg via ORAL
  Filled 2018-12-05 (×3): qty 1

## 2018-12-05 MED ORDER — ADULT MULTIVITAMIN W/MINERALS CH
1.0000 | ORAL_TABLET | Freq: Every day | ORAL | Status: DC
  Administered 2018-12-06 – 2018-12-10 (×5): 1 via ORAL
  Filled 2018-12-05 (×8): qty 1

## 2018-12-05 MED ORDER — GABAPENTIN 100 MG PO CAPS
100.0000 mg | ORAL_CAPSULE | Freq: Three times a day (TID) | ORAL | Status: DC
  Administered 2018-12-06 (×2): 100 mg via ORAL
  Filled 2018-12-05 (×4): qty 1

## 2018-12-05 MED ORDER — ONDANSETRON 4 MG PO TBDP
4.0000 mg | ORAL_TABLET | Freq: Four times a day (QID) | ORAL | Status: AC | PRN
  Administered 2018-12-05 – 2018-12-08 (×2): 4 mg via ORAL
  Filled 2018-12-05 (×3): qty 1

## 2018-12-05 MED ORDER — LOPERAMIDE HCL 2 MG PO CAPS
2.0000 mg | ORAL_CAPSULE | ORAL | Status: DC | PRN
  Administered 2018-12-05: 17:00:00 4 mg via ORAL
  Filled 2018-12-05: qty 2

## 2018-12-05 MED ORDER — HYDROXYZINE HCL 25 MG PO TABS
25.0000 mg | ORAL_TABLET | Freq: Four times a day (QID) | ORAL | Status: AC | PRN
  Administered 2018-12-05 – 2018-12-08 (×8): 25 mg via ORAL
  Filled 2018-12-05 (×8): qty 1

## 2018-12-05 MED ORDER — LOPERAMIDE HCL 2 MG PO CAPS: 2.0000 mg | ORAL_CAPSULE | ORAL | Status: AC | PRN

## 2018-12-05 MED ORDER — ALUM & MAG HYDROXIDE-SIMETH 200-200-20 MG/5ML PO SUSP: 30.0000 mL | ORAL | Status: DC | PRN

## 2018-12-05 NOTE — ED Provider Notes (Signed)
33 year old female presents with SI. Vitals are normal. No complaints from nursing staff. She is currently alert and watching TV. She meets inpatient criteria and is awaiting placement.   Bethel Born, PA-C 12/05/18 6256    Linwood Dibbles, MD 12/05/18 1038

## 2018-12-05 NOTE — Progress Notes (Signed)
Patient meets criteria for inpatient treatment. No appropriate or available beds at [facility]. CSW faxed referrals to the following facilities for review:  [facility]  [facility]  [facility]  [facility]  [facility]  [facility]  [facility]  [facility]  [facility]  [facility]  [facility]   TTS will continue to seek bed placement.  Vilma Meckel. Algis Greenhouse, MSW, LCSW Clinical Social Work/Disposition Phone: [phone] Fax: [phone]

## 2018-12-05 NOTE — ED Notes (Signed)
Pt ate 100% of lunch tray. Resting watching tv now.

## 2018-12-05 NOTE — ED Notes (Signed)
Attempted to call report to [facility] - RN to return call.  

## 2018-12-05 NOTE — ED Notes (Signed)
Lip balm requested and given to pt. Calm and cooperative at this time.

## 2018-12-05 NOTE — [facility] Counselor (Signed)
Re-assessed:   Shirley Murphy initial assessed 12/04/2018 complaints of suicidal and homicidal as well as auditory hallucinations.   Shirley Murphy report continues to have suicidal ideations with no plan and denies homicidal ideations (at the moment). Shirley Murphy report Shirley Murphy has not experienced auditory hallucinations since being in the hospital. Shirley Murphy report when Shirley Murphy's not in the hospital Shirley Murphy alone, Shirley Murphy lives by herself.  Shirley Murphy report Shirley Murphy receives services at [facility] [place] but a few weeks ago Shirley Murphy missed her appointment to get her medications. Shirley Murphy states that Shirley Murphy works in [employer] and at the [employer].  Shirley Murphy states that Shirley Murphy is currently laid off from both jobs due to COVID 19. Shirley Murphy report since being laid off work Shirley Murphy does not have anyone to talk to. Denied having family or friends that are 'sober' which Shirley Murphy can engage.   Shirley Calkins, NP, Shirley Murphy continues to meet inpatient criteria

## 2018-12-05 NOTE — ED Notes (Signed)
Pt asleep.

## 2018-12-05 NOTE — [facility] Suicide Risk Assessment (Signed)
[facility] Admission Suicide Risk Assessment   Nursing information obtained from:  Patient Demographic factors:  Living alone, Caucasian, Unemployed, Low socioeconomic status Current Mental Status:  Suicidal ideation indicated by patient, Suicide plan, Self-harm thoughts, Self-harm behaviors, Plan includes specific time, place, or method, Suicidal ideation indicated by others, Belief that plan would result in death, Plan to harm others, Thoughts of violence towards others, Intention to act on suicide plan, Intention to act on plan to harm others Loss Factors:  Decrease in vocational status, Financial problems / change in socioeconomic status Historical Factors:  Impulsivity Risk Reduction Factors:  Positive therapeutic relationship  Total Time spent with patient: 45 minutes Principal Problem:  Alcohol Use Disorder, Cocaine Use Disorder, Substance Induced Mood Disorder  Diagnosis:  Active Problems:   MDD (major depressive disorder), recurrent, severe, with psychosis (HCC)  Subjective Data:   Continued Clinical Symptoms:  Alcohol Use Disorder Identification Test Final Score (AUDIT): 34 The "Alcohol Use Disorders Identification Test", Guidelines for Use in Primary Care, Second Edition.  World Science writer [facility]). Score between 0-7:  no or low risk or alcohol related problems. Score between 8-15:  moderate risk of alcohol related problems. Score between 16-19:  high risk of alcohol related problems. Score 20 or above:  warrants further diagnostic evaluation for alcohol dependence and treatment.   CLINICAL FACTORS:  33 year old female, known to our unit from prior admissions, history of alcohol/cocaine abuse, history of mood disorder . Presents for depression, neuro-vegetative symptoms of depression, suicidal ideations with thoughts of jumping off a roof, also some irritability and violent thoughts of assaulting her BF and his ex wife. She stopped taking her psychiatric medications ( Zoloft,  Neurontin ) 2 weeks ago ( states Zoloft has been very helpful to her and she feels " a lot better when I take it") and relapsed on alcohol and cocaine recently.    Psychiatric Specialty Exam: Physical Exam  ROS  Blood pressure 138/81, pulse 78, temperature 98.6 F (37 C), temperature source Oral, resp. rate 18, height 4\' 7"  (1.397 m), weight 114.8 kg, last menstrual period 11/13/2018.Body mass index is 58.8 kg/m.  See admit note MSE    COGNITIVE FEATURES THAT CONTRIBUTE TO RISK:  Closed-mindedness and Loss of executive function    SUICIDE RISK:   Moderate:  Frequent suicidal ideation with limited intensity, and duration, some specificity in terms of plans, no associated intent, good self-control, limited dysphoria/symptomatology, some risk factors present, and identifiable protective factors, including available and accessible social support.  PLAN OF CARE: Patient will be admitted to inpatient psychiatric unit for stabilization and safety. Will provide and encourage milieu participation. Provide medication management and maked adjustments as needed.  Will also provide medication management to address alcohol WDL as needed- Will follow daily.    I certify that inpatient services furnished can reasonably be expected to improve the patient's condition.   Craige Cotta, MD 12/05/2018, 6:45 PM

## 2018-12-05 NOTE — Progress Notes (Signed)
   12/05/18 1600  COVID-19 Daily Checkoff  Have you had a fever (temp > 37.80C/100F)  in the past 24 hours?  No  If you have had runny nose, nasal congestion, sneezing in the past 24 hours, has it worsened? No  COVID-19 EXPOSURE  Have you traveled outside the state in the past 14 days? No  Have you been in contact with someone with a confirmed diagnosis of COVID-19 or PUI in the past 14 days without wearing appropriate PPE? No  Have you been living in the same home as a person with confirmed diagnosis of COVID-19 or a PUI (household contact)? No  Have you been diagnosed with COVID-19? No

## 2018-12-05 NOTE — ED Notes (Signed)
Pt ambulated to restroom and back into room. Pt now resting watching tv.

## 2018-12-05 NOTE — ED Notes (Signed)
Breakfast tray ordered 

## 2018-12-05 NOTE — ED Notes (Signed)
Lying on bed watching tv. 

## 2018-12-05 NOTE — ED Notes (Signed)
Re-TTS completed.  

## 2018-12-05 NOTE — Progress Notes (Signed)
Per Steffanie Rainwater, pt has been accepted to [facility] bed 305-2. Accepting provider is Reola Calkins, NP. Attending provider is Dr. Jama Flavors, MD. Patient can arrive by 13:30. Number for report is [phone]. AC spoke with Kriste Basque, RN regarding disposition.   Vilma Meckel. Algis Greenhouse, MSW, LCSW Clinical Social Work/Disposition Phone: [phone] Fax: [phone]

## 2018-12-05 NOTE — ED Notes (Addendum)
Pt voiced understanding and agreement w/tx plan - accepted to [facility]. Pt signed consent form - copy faxed to [facility], copy sent to Medical Records, original placed in envelope for [facility].

## 2018-12-05 NOTE — ED Notes (Signed)
Pt ambulatory to bathroom and back to room w/o difficulty.  

## 2018-12-05 NOTE — ED Notes (Signed)
Awaiting orders from md. Pt states that she is getting frustrated and needs her nicotine patch.

## 2018-12-05 NOTE — ED Notes (Signed)
Pt given lunch tray.

## 2018-12-05 NOTE — ED Notes (Signed)
Pt ambulated to restroom. Pt given ginger-ale and ice upon request.

## 2018-12-05 NOTE — ED Notes (Addendum)
Pt given coffee upon request after making a phone call

## 2018-12-05 NOTE — ED Notes (Signed)
Lunch order placed

## 2018-12-05 NOTE — ED Notes (Signed)
Attempted to call report x 2. Pt noted to be sleeping at this time.

## 2018-12-05 NOTE — Tx Team (Signed)
Initial Treatment Plan 12/05/2018 6:29 PM [name] [id number]    PATIENT STRESSORS: Financial difficulties Marital or family conflict Substance abuse   PATIENT STRENGTHS: Ability for insight General fund of knowledge   PATIENT IDENTIFIED PROBLEMS: 1. "Get back my mental stability"  2. "Learn something different that can help" so I don't relapse                   DISCHARGE CRITERIA:  Improved stabilization in mood, thinking, and/or behavior Withdrawal symptoms are absent or subacute and managed without 24-hour nursing intervention  PRELIMINARY DISCHARGE PLAN: Attend 12-step recovery group Outpatient therapy  PATIENT/FAMILY INVOLVEMENT: This treatment plan has been presented to and reviewed with the patient, Shirley Murphy.  The patient has been given the opportunity to ask questions and make suggestions.  Kirstie Mirza, RN 12/05/2018, 6:29 PM

## 2018-12-05 NOTE — ED Notes (Addendum)
ALL Belongings - 2 labeled belongings bags and 1 valuables envelope - [place] - Pt aware. Pt wearing eyeglasses and stud to below left lower lip.

## 2018-12-05 NOTE — ED Notes (Signed)
Pt ate 100% of her breakfast, pt laying watching tv at this time.

## 2018-12-05 NOTE — ED Notes (Signed)
Spoke with Dr. Preston Fleeting about medication orders. Placing orders currently. Pt aware and relieved.

## 2018-12-05 NOTE — H&P (Addendum)
Psychiatric Admission Assessment Adult  Patient Identification: Shirley Murphy MRN:  [id number] Date of Evaluation:  12/05/2018 Chief Complaint: " I have been off my medications and I have not been myself " Principal Diagnosis: MDD versus Substance Induced Mood Disorder/Depressed  Diagnosis:  Active Problems:   MDD (major depressive disorder), recurrent, severe, with psychosis (HCC)  History of Present Illness: 33 year old female, known to our unit from prior psychiatric admissions, most recently in February 2020. At the time was admitted for substance abuse, depression.  She reports worsening depression and has been experiencing suicidal ideations, with recent thoughts of jumping off a roof.  Reports that a few days ago overdosed on several tablets of Seroquel, states " I just slept for like a day or two, that was it" She was also having vague HI/violent  Ideations.  Endorses neuro-vegetative symptoms of depression as below but also states she has been feeling vaguely irritable , easily angered.  She attributes worsening symptoms in part to being off her psychiatric medications x 2 weeks after she was unable to keep appointment for refills . She also reports she relapsed on alcohol and on cocaine about 2 weeks ago, following a period of about 2 months of sobriety. She states  " I have been off my medications over the last two weeks, I don't feel like myself  and I have gone downhill". She also has been upset because boyfriend's ex wife has been in town and they have been " talking to each other". States she has had intermittent violent thoughts ( but not HI)  towards ex wife and boyfriend, states " I would like to beat the .... out of them". She reports she has been drinking up to 12 beers daily, and has been using cocaine daily over the last week Admission UDS positive for cocaine, no BAL on admission  Patient is not currently presenting with significant symptoms of alcohol WDL- no tremors, no  diaphoresis, no restlessness or agitation, no facial flushing, BP 133/81, pulse 78.   Associated Signs/Symptoms: Depression Symptoms:  depressed mood, anhedonia, insomnia, suicidal thoughts with specific plan, loss of energy/fatigue, erratic appetite (Hypo) Manic Symptoms: reports intermittent anger, irritability Anxiety Symptoms:  Increased anxiety Psychotic Symptoms:  States she occasionally hears voices when using cocaine. Currently not internally preoccupied, no delusions expressed. PTSD Symptoms: Reports history of PTSD symptoms related to witnessing someone die in front of her in 2012. States symptoms have improved overtime . Reports occasional nightmares, but does not endorse intrusive recollections or avoidance  Total Time spent with patient: 45 minutes  Past Psychiatric History: history of multiple psychiatric admissions , starting as a teenager. Her most recent admission was in February 2020 for depression and substance abuse . She reports history of suicide attempt by hanging self a year ago. She reports history of PTSD as above, states symptoms have improved overtime. She reports history of chronic depression, but states " I feel a lot better when I am on my medication", and states Zoloft has been very helpful for her. Reports she has been diagnosed with Bipolar Disorder in the past, but does not describe any clear history of mania . She does report history of angry/ explosive outbursts of short duration, suggestive of Intermittent Explosive Disorder,   Is the patient at risk to self? Yes.    Has the patient been a risk to self in the past 6 months? Yes.    Has the patient been a risk to self within the distant past?  Yes.    Is the patient a risk to others? Yes.    Has the patient been a risk to others in the past 6 months? No.  Has the patient been a risk to others within the distant past? No.   Prior Inpatient Therapy:  as above  Prior Outpatient Therapy:  Was going to [facility] the [place]  Alcohol Screening: 1. How often do you have a drink containing alcohol?: 4 or more times a week 2. How many drinks containing alcohol do you have on a typical day when you are drinking?: 10 or more 3. How often do you have six or more drinks on one occasion?: Daily or almost daily AUDIT-C Score: 12 4. How often during the last year have you found that you were not able to stop drinking once you had started?: Daily or almost daily 5. How often during the last year have you failed to do what was normally expected from you becasue of drinking?: Weekly 6. How often during the last year have you needed a first drink in the morning to get yourself going after a heavy drinking session?: Daily or almost daily 7. How often during the last year have you had a feeling of guilt of remorse after drinking?: Daily or almost daily 8. How often during the last year have you been unable to remember what happened the night before because you had been drinking?: Weekly 9. Have you or someone else been injured as a result of your drinking?: No 10. Has a relative or friend or a doctor or another health worker been concerned about your drinking or suggested you cut down?: Yes, during the last year Alcohol Use Disorder Identification Test Final Score (AUDIT): 34 Alcohol Brief Interventions/Follow-up: Alcohol Education Substance Abuse History in the last 12 months:   History of alcohol use disorder, cocaine use disorder. Reports relapse on alcohol 2 weeks ago, cocaine one week ago. Remote history of opiate abuse, but denies any recent opiate use .  Consequences of Substance Abuse: Reports past history of alcohol WDL seizures . History of alcohol related blackouts Previous Psychotropic Medications:  Zoloft 100 mgrs QDAY, Neurontin 30 mgrs TID, Trazodone 100 mgrs QHS Psychological Evaluations:  No  Past Medical History: history of Hep C .   Past Medical History:  Diagnosis Date  . Anemia   .  Anxiety   . Chest pain   . Depression   . Gallstones   . Hepatitis C   . Hypertension    patient reports only during detox  . Irregular heart beat     Past Surgical History:  Procedure Laterality Date  . c sections    . TUBAL LIGATION     Family History: father died in Jan 12, 2003 ( pancreatic cancer), mother alive, reports distant relationship with her, has three siblings  Family History  Problem Relation Age of Onset  . Hypertension Father   . Pancreatic cancer Father    Family Psychiatric  History: strong family history of alcohol dependence- father, sister, brother have alcohol abuse history. One paternal uncle committed suicide . Father had history of depression.  Tobacco Screening: Have you used any form of tobacco in the last 30 days? (Cigarettes, Smokeless Tobacco, Cigars, and/or Pipes): Yes Tobacco use, Select all that apply: 5 or more cigarettes per day Are you interested in Tobacco Cessation Medications?: Yes, will notify MD for an order Counseled patient on smoking cessation including recognizing danger situations, developing coping skills and basic information  about quitting provided: Yes Social History:  Social History   Substance and Sexual Activity  Alcohol Use Yes  . Alcohol/week: 24.0 standard drinks  . Types: 24 Cans of beer per week   Comment: daily use recently     Social History   Substance and Sexual Activity  Drug Use Yes  . Types: Heroin, "Crack" cocaine   Comment: daily use of crack cocaine    Additional Social History:  Allergies:   Allergies  Allergen Reactions  . Benzoyl Peroxide Swelling   Lab Results:  Results for orders placed or performed during the hospital encounter of 12/04/18 (from the past 48 hour(s))  Rapid urine drug screen (hospital performed)     Status: Abnormal   Collection Time: 12/04/18  5:29 PM  Result Value Ref Range   Opiates NONE DETECTED NONE DETECTED   Cocaine POSITIVE (A) NONE DETECTED   Benzodiazepines NONE DETECTED  NONE DETECTED   Amphetamines NONE DETECTED NONE DETECTED   Tetrahydrocannabinol NONE DETECTED NONE DETECTED   Barbiturates NONE DETECTED NONE DETECTED    Comment: (NOTE) DRUG SCREEN FOR MEDICAL PURPOSES ONLY.  IF CONFIRMATION IS NEEDED FOR ANY PURPOSE, NOTIFY LAB WITHIN 5 DAYS. LOWEST DETECTABLE LIMITS FOR URINE DRUG SCREEN Drug Class                     Cutoff (ng/mL) Amphetamine and metabolites    1000 Barbiturate and metabolites    200 Benzodiazepine                 200 Tricyclics and metabolites     300 Opiates and metabolites        300 Cocaine and metabolites        300 THC                            50 Performed at [facility], [address] [place], [place] [address]     Blood Alcohol level:  Lab Results  Component Value Date   ETH <10 09/17/2018   ETH 13 (H) 02/27/2018    Metabolic Disorder Labs:  No results found for: HGBA1C, MPG No results found for: PROLACTIN No results found for: CHOL, TRIG, HDL, CHOLHDL, VLDL, LDLCALC  Current Medications: Current Facility-Administered Medications  Medication Dose Route Frequency Provider Last Rate Last Dose  . acetaminophen (TYLENOL) tablet 650 mg  650 mg Oral Q6H PRN Money, Shirley Burdockravis B, FNP   650 mg at 12/05/18 1714  . alum & mag hydroxide-simeth (MAALOX/MYLANTA) 200-200-20 MG/5ML suspension 30 mL  30 mL Oral Q4H PRN Money, Shirley Burdockravis B, FNP      . chlordiazePOXIDE (LIBRIUM) capsule 25 mg  25 mg Oral Q6H PRN Money, Shirley Burdockravis B, FNP   25 mg at 12/05/18 1714  . gabapentin (NEURONTIN) capsule 300 mg  300 mg Oral TID Money, Shirley Burdockravis B, FNP   300 mg at 12/05/18 1713  . [START ON 12/08/2018] hydrOXYzine (ATARAX/VISTARIL) tablet 25 mg  25 mg Oral TID PRN Money, Shirley Burdockravis B, FNP      . hydrOXYzine (ATARAX/VISTARIL) tablet 25 mg  25 mg Oral Q6H PRN Money, Shirley Burdockravis B, FNP   25 mg at 12/05/18 1714  . loperamide (IMODIUM) capsule 2-4 mg  2-4 mg Oral PRN Money, Shirley Burdockravis B, FNP   4 mg at 12/05/18 1713  . magnesium hydroxide (MILK OF MAGNESIA)  suspension 30 mL  30 mL Oral Daily PRN Money, Shirley Burdockravis B, FNP      .  multivitamin with minerals tablet 1 tablet  1 tablet Oral Daily Money, Shirley Burdock, FNP   1 tablet at 12/05/18 1713  . nicotine (NICODERM CQ - dosed in mg/24 hours) patch 21 mg  21 mg Transdermal Daily Mattea Seger A, MD      . ondansetron (ZOFRAN-ODT) disintegrating tablet 4 mg  4 mg Oral Q6H PRN Money, Shirley Burdock, FNP   4 mg at 12/05/18 1712  . [START ON 12/06/2018] thiamine (VITAMIN B-1) tablet 100 mg  100 mg Oral Daily Money, Feliz Beam B, FNP      . traZODone (DESYREL) tablet 50 mg  50 mg Oral QHS PRN Money, Shirley Burdock, FNP       PTA Medications: Medications Prior to Admission  Medication Sig Dispense Refill Last Dose  . ferrous sulfate 325 (65 FE) MG EC tablet Take 325 mg by mouth daily with breakfast.     . ibuprofen (ADVIL) 800 MG tablet Take 800 mg by mouth 3 (three) times daily as needed.     . traZODone (DESYREL) 150 MG tablet Take 150 mg by mouth at bedtime as needed for sleep.     Marland Kitchen gabapentin (NEURONTIN) 300 MG capsule Take 300 mg by mouth 3 (three) times daily.   Past Month at Unknown time  . naproxen (NAPROSYN) 500 MG tablet Take 1 tablet (500 mg total) by mouth 2 (two) times daily as needed (aching, pain, or discomfort). (Patient not taking: Reported on 12/04/2018) 20 tablet 0 Completed Course at Unknown time  . nicotine (NICODERM CQ - DOSED IN MG/24 HOURS) 21 mg/24hr patch Place 1 patch (21 mg total) onto the skin daily. For smoking cessation (Patient not taking: Reported on 12/04/2018) 28 patch 0 Not Taking at Unknown time  . ondansetron (ZOFRAN ODT) 8 MG disintegrating tablet Take 1 tablet (8 mg total) by mouth every 8 (eight) hours as needed for nausea or vomiting. (Patient not taking: Reported on 12/04/2018) 20 tablet 0 Completed Course at Unknown time  . sertraline (ZOLOFT) 100 MG tablet Take 100 mg by mouth daily.   Past Month at Unknown time  . traZODone (DESYREL) 50 MG tablet Take 1 tablet (50 mg total) by mouth at  bedtime as needed and may repeat dose one time if needed for sleep. 30 tablet 0 Past Month at Unknown time    Musculoskeletal: Strength & Muscle Tone: within normal limits no tremors, no diaphoresis, no restlessness or agitation Gait & Station: normal Patient leans: N/A  Psychiatric Specialty Exam: Physical Exam  Review of Systems  Constitutional: Negative for chills and fever.  HENT: Negative.   Eyes: Negative.   Respiratory: Negative for cough and shortness of breath.   Cardiovascular: Negative.  Negative for chest pain.  Gastrointestinal: Positive for diarrhea and nausea. Negative for vomiting.  Genitourinary: Negative.   Musculoskeletal: Negative.   Skin: Negative.   Neurological: Positive for seizures and headaches.       Reports history of an alcohol WDL seizure 2 years ago  Endo/Heme/Allergies: Negative.   Psychiatric/Behavioral: Positive for depression, substance abuse and suicidal ideas.  All other systems reviewed and are negative.   Blood pressure 138/81, pulse 78, temperature 98.6 F (37 C), temperature source Oral, resp. rate 18, height  (1.397 m), weight 114.8 kg, last menstrual period 11/13/2018.Body mass index is 58.8 kg/m.  General Appearance: Fairly Groomed  Eye Contact:  Fair  Speech:  Normal Rate  Volume:  Decreased  Mood:  depressed, dysphoric, reports mood as 3/10, with 10 being best  Affect:  constricted, subtly irritable  Thought Process:  Linear and Descriptions of Associations: Intact  Orientation:  Full (Time, Place, and Person)  Thought Content:  currently does not express delusions,  currently denies hallucinations, does not appear internally preoccupied   Suicidal Thoughts:  No  Currently denies any suicidal or self injurious ideations, and contracts for safety on unit   Homicidal Thoughts:  Yes.  without intent/plan reports she has been having vague HI towards no one specific, and states has been " wanting to beat the ... out of BF and BF's  exwife "  Memory:  recent and remote grossly intact   Judgement:  Other:  limited   Insight:  Fair  Psychomotor Activity:  Normal currently no significant distal tremors or diaphoresis, no psychomotor agitation  Concentration:  Concentration: Fair and Attention Span: Fair  Recall:  Good  Fund of Knowledge:  Good  Language:  Good  Akathisia:  Negative  Handed:  Right  AIMS (if indicated):     Assets:  Desire for Improvement Resilience  ADL's:  Intact  Cognition:  WNL  Sleep:       Treatment Plan Summary: Daily contact with patient to assess and evaluate symptoms and progress in treatment, Medication management, Plan inpatient treatment and medications as below   Observation Level/Precautions:  15 minute checks  Laboratory:  as needed CMP, CBC, Urine Pregnancy  Psychotherapy:  Milieu, group therapy   Medications:  Patient reports that Ativan detox has been better tolerated than Librium in the past. She also has history of liver disease and elevated transaminases although they had trended down on most recent bloodwork Will start Ativan detox protocol. Restart Zoloft 100 mgrs QDAY, restart Neurontin at 100 mgrs TID initially, restart Trazodone at 50 mgrs QHS PRN  Consultations:  As needed   Discharge Concerns:  -  Estimated LOS: 5 days   Other:     Physician Treatment Plan for Primary Diagnosis:  Alcohol /Cocaine Use Disorder  Long Term Goal(s): Improvement in symptoms so as ready for discharge  Short Term Goals: Ability to identify triggers associated with substance abuse/mental health issues will improve  Physician Treatment Plan for Secondary Diagnosis: Alcohol Induced Mood Disorder versus MDD Long Term Goal(s): Improvement in symptoms so as ready for discharge  Short Term Goals: Ability to identify changes in lifestyle to reduce recurrence of condition will improve, Ability to verbalize feelings will improve, Ability to disclose and discuss suicidal ideas, Ability to  demonstrate self-control will improve, Ability to identify and develop effective coping behaviors will improve and Ability to maintain clinical measurements within normal limits will improve  I certify that inpatient services furnished can reasonably be expected to improve the patient's condition.    Craige Cotta, MD 4/19/20205:55 PM

## 2018-12-05 NOTE — ED Notes (Signed)
Dinner order placed 

## 2018-12-05 NOTE — Progress Notes (Signed)
D   Pt has kept to herself this evening and has had limited interaction with others    She endorses depression and anxiety and irritability A   Verbal support given   Medications offered   Q 15 min checks R   Pt is safe at this time  [place] NOVEL CORONAVIRUS (COVID-19) DAILY CHECK-OFF SYMPTOMS - answer yes or no to each - every day NO YES  Have you had a fever in the past 24 hours?  . Fever (Temp > 37.80C / 100F) X   Have you had any of these symptoms in the past 24 hours? . New Cough .  Sore Throat  .  Shortness of Breath .  Difficulty Breathing .  Unexplained Body Aches   X   Have you had any one of these symptoms in the past 24 hours not related to allergies?   . Runny Nose .  Nasal Congestion .  Sneezing   X   If you have had runny nose, nasal congestion, sneezing in the past 24 hours, has it worsened?  X   EXPOSURES - check yes or no X   Have you traveled outside the state in the past 14 days?  X   Have you been in contact with someone with a confirmed diagnosis of COVID-19 or PUI in the past 14 days without wearing appropriate PPE?  X   Have you been living in the same home as a person with confirmed diagnosis of COVID-19 or a PUI (household contact)?    X   Have you been diagnosed with COVID-19?    X              What to do next: Answered NO to all: Answered YES to anything:   Proceed with unit schedule Follow the [facility] Inpatient Flowsheet.
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
## 2018-12-05 NOTE — ED Notes (Signed)
Attempted to call report - was advised RN will return call.

## 2018-12-05 NOTE — ED Provider Notes (Signed)
Pt has been accepted at [facility].  She is stable for transfer.   Jacalyn Lefevre, MD 12/05/18 1451

## 2018-12-05 NOTE — ED Notes (Addendum)
Awaiting orders from MD for medications.

## 2018-12-05 NOTE — ED Notes (Signed)
Pt given ginger-ale and crackers&cheese for snack.

## 2018-12-06 LAB — CBC WITH DIFFERENTIAL/PLATELET
Abs Immature Granulocytes: 0.02 10*3/uL (ref 0.00–0.07)
Basophils Absolute: 0 10*3/uL (ref 0.0–0.1)
Basophils Relative: 0 %
Eosinophils Absolute: 0.2 10*3/uL (ref 0.0–0.5)
Eosinophils Relative: 3 %
HCT: 37.4 % (ref 36.0–46.0)
Hemoglobin: 10.7 g/dL — ABNORMAL LOW (ref 12.0–15.0)
Immature Granulocytes: 0 %
Lymphocytes Relative: 33 %
Lymphs Abs: 2 10*3/uL (ref 0.7–4.0)
MCH: 23.1 pg — ABNORMAL LOW (ref 26.0–34.0)
MCHC: 28.6 g/dL — ABNORMAL LOW (ref 30.0–36.0)
MCV: 80.8 fL (ref 80.0–100.0)
Monocytes Absolute: 0.6 10*3/uL (ref 0.1–1.0)
Monocytes Relative: 10 %
Neutro Abs: 3.3 10*3/uL (ref 1.7–7.7)
Neutrophils Relative %: 54 %
Platelets: 283 10*3/uL (ref 150–400)
RBC: 4.63 MIL/uL (ref 3.87–5.11)
RDW: 21.2 % — ABNORMAL HIGH (ref 11.5–15.5)
WBC: 6.1 10*3/uL (ref 4.0–10.5)
nRBC: 0 % (ref 0.0–0.2)

## 2018-12-06 LAB — COMPREHENSIVE METABOLIC PANEL
ALT: 18 U/L (ref 0–44)
AST: 13 U/L — ABNORMAL LOW (ref 15–41)
Albumin: 3.9 g/dL (ref 3.5–5.0)
Alkaline Phosphatase: 48 U/L (ref 38–126)
Anion gap: 6 (ref 5–15)
BUN: 17 mg/dL (ref 6–20)
CO2: 24 mmol/L (ref 22–32)
Calcium: 9.3 mg/dL (ref 8.9–10.3)
Chloride: 108 mmol/L (ref 98–111)
Creatinine, Ser: 0.96 mg/dL (ref 0.44–1.00)
GFR calc Af Amer: >60 mL/min (ref >60)
GFR calc non Af Amer: >60 mL/min (ref >60)
Glucose, Bld: 94 mg/dL (ref 70–99)
Potassium: 4.5 mmol/L (ref 3.5–5.1)
Sodium: 138 mmol/L (ref 135–145)
Total Bilirubin: 0.5 mg/dL (ref 0.3–1.2)
Total Protein: 7.2 g/dL (ref 6.5–8.1)

## 2018-12-06 LAB — PREGNANCY, URINE: Preg Test, Ur: NEGATIVE

## 2018-12-06 MED ORDER — SERTRALINE HCL 100 MG PO TABS
100.0000 mg | ORAL_TABLET | Freq: Every day | ORAL | Status: DC
  Administered 2018-12-06 – 2018-12-10 (×5): 100 mg via ORAL
  Filled 2018-12-06 (×8): qty 1

## 2018-12-06 MED ORDER — IBUPROFEN 400 MG PO TABS
400.0000 mg | ORAL_TABLET | Freq: Four times a day (QID) | ORAL | Status: DC | PRN
  Administered 2018-12-06 – 2018-12-10 (×9): 400 mg via ORAL
  Filled 2018-12-06 (×9): qty 1

## 2018-12-06 MED ORDER — GABAPENTIN 300 MG PO CAPS
300.0000 mg | ORAL_CAPSULE | Freq: Two times a day (BID) | ORAL | Status: DC
  Administered 2018-12-07 – 2018-12-08 (×3): 300 mg via ORAL
  Filled 2018-12-06 (×5): qty 1
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
## 2018-12-06 NOTE — Progress Notes (Signed)
Recreation Therapy Notes  Date:  4.20.20 Time: 0930 Location: [address] Dayroom  Group Topic: Stress Management  Goal Area(s) Addresses:  Patient will identify positive stress management techniques. Patient will identify benefits of using stress management post d/c.  Behavioral Response:  Engaged  Intervention: Stress Management  Activity :  Meditation.  LRT introduced the stress management technique of meditation.  LRT played a meditation that focused on making the most of the day and the possibilities that are available.  Patients were to listen and follow along as meditation was played to engage in activity.  Education:  Stress Management, Discharge Planning.   Education Outcome: Acknowledges Education  Clinical Observations/Feedback: Pt attended and participated in group.     Caroll Rancher, LRT/CTRS         Lillia Abed, Haywood Meinders A 12/06/2018 11:00 AM

## 2018-12-06 NOTE — Progress Notes (Signed)
Adult Psychoeducational Group Note  Date:  12/06/2018 Time:  6:58 PM  Group Topic/Focus:  Goals Group:   The focus of this group is to help patients establish daily goals to achieve during treatment and discuss how the patient can incorporate goal setting into their daily lives to aide in recovery.  Participation Level:  Active  Participation Quality:  Appropriate  Affect:  Appropriate  Cognitive:  Appropriate  Insight: Appropriate  Engagement in Group:  Engaged  Modes of Intervention:  Discussion  Additional Comments:  Pt attended group and participated in discussion.  Pranavi Aure R Eduardo Wurth 12/06/2018, 6:58 PM

## 2018-12-06 NOTE — Progress Notes (Signed)
Patient did not attend wrap up group. 

## 2018-12-06 NOTE — Progress Notes (Signed)
D   Pt is appropriate and pleasant   She endorses depression and anxiety    She said she has been in her room most of the day because she feels irritable A   Verbal support given   Medications administered and effectiveness monitored    Q 15 min checks R    Pt is safe at this time   NOVEL CORONAVIRUS (COVID-19) DAILY CHECK-OFF SYMPTOMS - answer yes or no to each - every day NO YES  Have you had a fever in the past 24 hours?  . Fever (Temp > 37.80C / 100F) X   Have you had any of these symptoms in the past 24 hours? . New Cough .  Sore Throat  .  Shortness of Breath .  Difficulty Breathing .  Unexplained Body Aches   X   Have you had any one of these symptoms in the past 24 hours not related to allergies?   . Runny Nose .  Nasal Congestion .  Sneezing   X   If you have had runny nose, nasal congestion, sneezing in the past 24 hours, has it worsened?  X   EXPOSURES - check yes or no X   Have you traveled outside the state in the past 14 days?  X   Have you been in contact with someone with a confirmed diagnosis of COVID-19 or PUI in the past 14 days without wearing appropriate PPE?  X   Have you been living in the same home as a person with confirmed diagnosis of COVID-19 or a PUI (household contact)?    X   Have you been diagnosed with COVID-19?    X              What to do next: Answered NO to all: Answered YES to anything:   Proceed with unit schedule Follow the [facility] Inpatient Flowsheet.

## 2018-12-06 NOTE — [facility] Group Notes (Signed)
Adult Psychoeducational Nursing Group Note  Date:  12/06/2018 Time:  4:00 PM  Group Topic/Focus: Emotional First Aid Emotional Education:   The focus of this group is to discuss what feelings/emotions are, and how they are experienced, and how to cope.  Participation Level:  Did Not Attend  Additional Comments:  Patient was invited but declined to attend group.  Marchelle Folks A Freedom Peddy 12/06/2018, 5:00 PM
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
## 2018-12-06 NOTE — Plan of Care (Signed)
Nursing Progress Note [id number]  On initial approach, patient is seen sitting up in the dayroom laughing and playing cards with peers. Upon interaction, patient states she is "anxious and psychotic". Patient appears to have pressured speech and several somatic complaints including anxiety, back pain and constipation. Patient compliant with scheduled medications. Patient is seen attending groups and visible in the milieu. Patient currently denies SI/HI/AVH.   Patient is educated about and provided medication per provider's orders. Patient safety maintained with q15 min safety checks and fall risk precautions. Emotional support given, 1:1 interaction, and active listening provided. Patient encouraged to attend meals, groups, and work on treatment plan and goals. Labs, vital signs and patient behavior monitored throughout shift.   Patient contracts for safety with staff. Patient remains safe on the unit at this time and agrees to come to staff with any issues/concerns. Patient is interacting with peers appropriately on the unit. Will continue to support and monitor.   Patient's self-inventory sheet Rated Energy Level  Low  Rated Sleep  Poor  Rated Appetite  Fair  Rated Anxiety (0-10)  8  Rated Hopelessness (0-10)  8  Rated Depression (0-10)  7  Daily Goal  "think about how I really feel"  Any Additional Comments:      Problem: Medication: Goal: Compliance with prescribed medication regimen will improve Outcome: Progressing   Problem: Education: Goal: Knowledge of [place] [facility] Education information/materials will improve Outcome: Progressing   Problem: Activity: Goal: Interest or engagement in activities will improve Outcome: Progressing   Problem: Health Behavior/Discharge Planning: Goal: Compliance with treatment plan for underlying cause of condition will improve Outcome: Progressing   Problem: Safety: Goal: Periods of time without injury will increase Outcome:  Progressing    [place] NOVEL CORONAVIRUS (COVID-19) DAILY CHECK-OFF SYMPTOMS - answer yes or no to each - every day NO YES  Have you had a fever in the past 24 hours?  Fever (Temp > 37.80C / 100F) X   Have you had any of these symptoms in the past 24 hours? New Cough  Sore Throat   Shortness of Breath  Difficulty Breathing  Unexplained Body Aches   X   Have you had any one of these symptoms in the past 24 hours not related to allergies?   Runny Nose  Nasal Congestion  Sneezing   X   If you have had runny nose, nasal congestion, sneezing in the past 24 hours, has it worsened?  X   EXPOSURES - check yes or no X   Have you traveled outside the state in the past 14 days?  X   Have you been in contact with someone with a confirmed diagnosis of COVID-19 or PUI in the past 14 days without wearing appropriate PPE?  X   Have you been living in the same home as a person with confirmed diagnosis of COVID-19 or a PUI (household contact)?    X   Have you been diagnosed with COVID-19?    X              What to do next: Answered NO to all: Answered YES to anything:   Proceed with unit schedule Follow the [facility] Inpatient Flowsheet.

## 2018-12-06 NOTE — Tx Team (Signed)
Interdisciplinary Treatment and Diagnostic Plan Update  12/06/2018 Time of Session: 9:00am Shirley Murphy MRN: [id number]  Principal Diagnosis: <principal problem not specified>  Secondary Diagnoses: Active Problems:   MDD (major depressive disorder), recurrent, severe, with psychosis (HCC)   Current Medications:  Current Facility-Administered Medications  Medication Dose Route Frequency Provider Last Rate Last Dose  . acetaminophen (TYLENOL) tablet 650 mg  650 mg Oral Q6H PRN Money, Gerlene Burdock, FNP   650 mg at 12/06/18 0819  . alum & mag hydroxide-simeth (MAALOX/MYLANTA) 200-200-20 MG/5ML suspension 30 mL  30 mL Oral Q4H PRN Money, Feliz Beam B, FNP      . gabapentin (NEURONTIN) capsule 100 mg  100 mg Oral TID Cobos, Rockey Situ, MD   100 mg at 12/06/18 0816  . hydrOXYzine (ATARAX/VISTARIL) tablet 25 mg  25 mg Oral Q6H PRN Money, Gerlene Burdock, FNP   25 mg at 12/06/18 8657  . ibuprofen (ADVIL) tablet 400 mg  400 mg Oral Q6H PRN Cobos, Rockey Situ, MD   400 mg at 12/06/18 0928  . loperamide (IMODIUM) capsule 2-4 mg  2-4 mg Oral PRN Cobos, Rockey Situ, MD      . LORazepam (ATIVAN) tablet 1 mg  1 mg Oral Q6H PRN Cobos, Rockey Situ, MD   1 mg at 12/06/18 0819  . magnesium hydroxide (MILK OF MAGNESIA) suspension 30 mL  30 mL Oral Daily PRN Money, Feliz Beam B, FNP   30 mL at 12/06/18 0929  . multivitamin with minerals tablet 1 tablet  1 tablet Oral Daily Cobos, Rockey Situ, MD   1 tablet at 12/06/18 0816  . nicotine (NICODERM CQ - dosed in mg/24 hours) patch 21 mg  21 mg Transdermal Daily Cobos, Rockey Situ, MD   21 mg at 12/06/18 0815  . ondansetron (ZOFRAN-ODT) disintegrating tablet 4 mg  4 mg Oral Q6H PRN Cobos, Rockey Situ, MD   4 mg at 12/05/18 2334  . sertraline (ZOLOFT) tablet 100 mg  100 mg Oral Daily Cobos, Rockey Situ, MD   100 mg at 12/06/18 0927  . thiamine (B-1) injection 100 mg  100 mg Intramuscular Once Cobos, Fernando A, MD      . thiamine (VITAMIN B-1) tablet 100 mg  100 mg Oral Daily Cobos,  Rockey Situ, MD   100 mg at 12/06/18 0816  . traZODone (DESYREL) tablet 50 mg  50 mg Oral QHS PRN Money, Gerlene Burdock, FNP   50 mg at 12/05/18 2334   PTA Medications: Medications Prior to Admission  Medication Sig Dispense Refill Last Dose  . ferrous sulfate 325 (65 FE) MG EC tablet Take 325 mg by mouth daily with breakfast.     . ibuprofen (ADVIL) 800 MG tablet Take 800 mg by mouth 3 (three) times daily as needed.     . traZODone (DESYREL) 150 MG tablet Take 150 mg by mouth at bedtime as needed for sleep.     Marland Kitchen gabapentin (NEURONTIN) 300 MG capsule Take 300 mg by mouth 3 (three) times daily.   Past Month at Unknown time  . naproxen (NAPROSYN) 500 MG tablet Take 1 tablet (500 mg total) by mouth 2 (two) times daily as needed (aching, pain, or discomfort). (Patient not taking: Reported on 12/04/2018) 20 tablet 0 Completed Course at Unknown time  . nicotine (NICODERM CQ - DOSED IN MG/24 HOURS) 21 mg/24hr patch Place 1 patch (21 mg total) onto the skin daily. For smoking cessation (Patient not taking: Reported on 12/04/2018) 28 patch 0 Not Taking at Unknown time  .  ondansetron (ZOFRAN ODT) 8 MG disintegrating tablet Take 1 tablet (8 mg total) by mouth every 8 (eight) hours as needed for nausea or vomiting. (Patient not taking: Reported on 12/04/2018) 20 tablet 0 Completed Course at Unknown time  . sertraline (ZOLOFT) 100 MG tablet Take 100 mg by mouth daily.   Past Month at Unknown time  . traZODone (DESYREL) 50 MG tablet Take 1 tablet (50 mg total) by mouth at bedtime as needed and may repeat dose one time if needed for sleep. 30 tablet 0 Past Month at Unknown time    Patient Stressors: Financial difficulties Marital or family conflict Substance abuse  Patient Strengths: Ability for insight General fund of knowledge  Treatment Modalities: Medication Management, Group therapy, Case management,  1 to 1 session with clinician, Psychoeducation, Recreational therapy.   Physician Treatment Plan for  Primary Diagnosis: <principal problem not specified> Long Term Goal(s): Improvement in symptoms so as ready for discharge Improvement in symptoms so as ready for discharge   Short Term Goals: Ability to identify triggers associated with substance abuse/mental health issues will improve Ability to identify changes in lifestyle to reduce recurrence of condition will improve Ability to verbalize feelings will improve Ability to disclose and discuss suicidal ideas Ability to demonstrate self-control will improve Ability to identify and develop effective coping behaviors will improve Ability to maintain clinical measurements within normal limits will improve  Medication Management: Evaluate patient's response, side effects, and tolerance of medication regimen.  Therapeutic Interventions: 1 to 1 sessions, Unit Group sessions and Medication administration.  Evaluation of Outcomes: Progressing  Physician Treatment Plan for Secondary Diagnosis: Active Problems:   MDD (major depressive disorder), recurrent, severe, with psychosis (HCC)  Long Term Goal(s): Improvement in symptoms so as ready for discharge Improvement in symptoms so as ready for discharge   Short Term Goals: Ability to identify triggers associated with substance abuse/mental health issues will improve Ability to identify changes in lifestyle to reduce recurrence of condition will improve Ability to verbalize feelings will improve Ability to disclose and discuss suicidal ideas Ability to demonstrate self-control will improve Ability to identify and develop effective coping behaviors will improve Ability to maintain clinical measurements within normal limits will improve     Medication Management: Evaluate patient's response, side effects, and tolerance of medication regimen.  Therapeutic Interventions: 1 to 1 sessions, Unit Group sessions and Medication administration.  Evaluation of Outcomes: Progressing   RN Treatment  Plan for Primary Diagnosis: <principal problem not specified> Long Term Goal(s): Knowledge of disease and therapeutic regimen to maintain health will improve  Short Term Goals: Ability to demonstrate self-control, Ability to identify and develop effective coping behaviors will improve and Compliance with prescribed medications will improve  Medication Management: RN will administer medications as ordered by provider, will assess and evaluate patient's response and provide education to patient for prescribed medication. RN will report any adverse and/or side effects to prescribing provider.  Therapeutic Interventions: 1 on 1 counseling sessions, Psychoeducation, Medication administration, Evaluate responses to treatment, Monitor vital signs and CBGs as ordered, Perform/monitor CIWA, COWS, AIMS and Fall Risk screenings as ordered, Perform wound care treatments as ordered.  Evaluation of Outcomes: Progressing   LCSW Treatment Plan for Primary Diagnosis: <principal problem not specified> Long Term Goal(s): Safe transition to appropriate next level of care at discharge, Engage patient in therapeutic group addressing interpersonal concerns.  Short Term Goals: Engage patient in aftercare planning with referrals and resources, Increase social support, Identify triggers associated with mental health/substance abuse  issues and Increase skills for wellness and recovery  Therapeutic Interventions: Assess for all discharge needs, 1 to 1 time with Social worker, Explore available resources and support systems, Assess for adequacy in community support network, Educate family and significant other(s) on suicide prevention, Complete Psychosocial Assessment, Interpersonal group therapy.  Evaluation of Outcomes: Progressing   Progress in Treatment: Attending groups: No. Participating in groups: No. Taking medication as prescribed: Yes. Toleration medication: Yes. Family/Significant other contact made: No,  will contact:  supports if consents are granted Patient understands diagnosis: Yes. Discussing patient identified problems/goals with staff: Yes. Medical problems stabilized or resolved: No. Denies suicidal/homicidal ideation: No. Issues/concerns per patient self-inventory: Yes.  New problem(s) identified: Yes, Describe:  limited social supports, limited financial resources, voicing plans to harm her ex-boyfriend  New Short Term/Long Term Goal(s): detox, medication management for mood stabilization; elimination of SI thoughts; development of comprehensive mental wellness/sobriety plan.  Patient Goals: "Get back my mental stability"  Discharge Plan or Barriers: CSW continuing to assess for appropriate referrals.   Reason for Continuation of Hospitalization: Anxiety Depression Medication stabilization Suicidal ideation Withdrawal symptoms  Estimated Length of Stay: 3-5 days  Attendees: Patient: Shirley Murphy 12/06/2018 10:39 AM  Physician:  12/06/2018 10:39 AM  Nursing:  12/06/2018 10:39 AM  RN Care Manager: 12/06/2018 10:39 AM  Social Worker: Enid Cutter, [place] 12/06/2018 10:39 AM  Recreational Therapist:  12/06/2018 10:39 AM  Other:  12/06/2018 10:39 AM  Other:  12/06/2018 10:39 AM  Other: 12/06/2018 10:39 AM    Scribe for Treatment Team: Darreld Mclean, LCSWA 12/06/2018 10:39 AM

## 2018-12-07 MED ORDER — ARIPIPRAZOLE 5 MG PO TABS
5.0000 mg | ORAL_TABLET | Freq: Every day | ORAL | Status: DC
  Administered 2018-12-07 – 2018-12-09 (×3): 5 mg via ORAL
  Filled 2018-12-07 (×5): qty 1
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
## 2018-12-07 NOTE — Progress Notes (Signed)
Adult Psychoeducational Group Note  Date:  12/07/2018 Time:  9:37 PM  Group Topic/Focus:  Wrap-Up Group:   The focus of this group is to help patients review their daily goal of treatment and discuss progress on daily workbooks.  Participation Level:  Active  Participation Quality:  Appropriate  Affect:  Appropriate  Cognitive:  Appropriate  Insight: Appropriate  Engagement in Group:  Engaged  Modes of Intervention:  Discussion  Additional Comments:  Patient attended group and participated  Dawayne Ohair W Tysheem Accardo 12/07/2018, 9:37 PM
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
## 2018-12-07 NOTE — [facility] Suicide Risk Assessment (Signed)
[facility] INPATIENT:  Family/Significant Other Suicide Prevention Education  Suicide Prevention Education:  Patient Refusal for Family/Significant Other Suicide Prevention Education: The patient Shirley Murphy has refused to provide written consent for family/significant other to be provided Family/Significant Other Suicide Prevention Education during admission and/or prior to discharge.  Physician notified.  SPE completed with patient, as patient refused to consent to family contact. SPI pamphlet provided to pt and pt was encouraged to share information with support network, ask questions, and talk about any concerns relating to SPE. Patient denies access to guns/firearms and verbalized understanding of information provided. Mobile Crisis information also provided to patient.    [name] 12/07/2018, 1:18 PM

## 2018-12-07 NOTE — Plan of Care (Signed)
Nursing Progress Note [id number]  On initial approach, patient is seen up in the milieu. Patient presents calm and cooperative during initial encounter but became irate with Clinical research associate when she requested PRN Ativan. Patient does not appear to be in withdrawal and did not meet criteria for Ativan; patient informed of this and began to cuss. Patient was provided PRN Vistaril and Ibuprofen as requested and was compliant with scheduled medications. Patient also expressed she was upset her Gabapentin was only ordered twice a day. Patient observed then going into the dayroom and began to make statements such as, "if they want crazy, I'll show them crazy. Fuck that fucking doctor, I need my medicine". Patient then returned to her room and slammed the door. Patient currently denies SI/HI/AVH today. Patient has not been attending groups on the unit.  Patient is educated about and provided medication per provider's orders. Patient safety maintained with q15 min safety checks and low fall risk precautions. Emotional support given, 1:1 interaction, and active listening provided. Patient encouraged to attend meals, groups, and work on treatment plan and goals. Labs, vital signs and patient behavior monitored throughout shift.   Patient contracts for safety with staff. Will continue to support and monitor.   Patient's self-inventory sheet Rated Energy Level  Low  Rated Sleep  Poor  Rated Appetite  Fair  Rated Anxiety (0-10)  5 "I'm angry"  Rated Hopelessness (0-10)  5  Rated Depression (0-10)  6  Daily Goal  "talk to the doctor"  Any Additional Comments:      Problem: Coping: Goal: Coping ability will improve Outcome: Not Progressing   Problem: Safety: Goal: Periods of time without injury will increase Outcome: Progressing   [place] NOVEL CORONAVIRUS (COVID-19) DAILY CHECK-OFF SYMPTOMS - answer yes or no to each - every day NO YES  Have you had a fever in the past 24 hours?  Fever (Temp > 37.80C /  100F) X   Have you had any of these symptoms in the past 24 hours? New Cough  Sore Throat   Shortness of Breath  Difficulty Breathing  Unexplained Body Aches   X   Have you had any one of these symptoms in the past 24 hours not related to allergies?   Runny Nose  Nasal Congestion  Sneezing   X   If you have had runny nose, nasal congestion, sneezing in the past 24 hours, has it worsened?  X   EXPOSURES - check yes or no X   Have you traveled outside the state in the past 14 days?  X   Have you been in contact with someone with a confirmed diagnosis of COVID-19 or PUI in the past 14 days without wearing appropriate PPE?  X   Have you been living in the same home as a person with confirmed diagnosis of COVID-19 or a PUI (household contact)?    X   Have you been diagnosed with COVID-19?    X              What to do next: Answered NO to all: Answered YES to anything:   Proceed with unit schedule Follow the [facility] Inpatient Flowsheet.

## 2018-12-07 NOTE — [facility] Counselor (Signed)
CSW attempted to engage with the patient and complete PSA, however the patient was asleep in her room. CSW called the patient's name in an attempt to awake her multiple times, however she did not respond.   CSW will attempt to complete PSA at a later time.    Baldo Daub, MSW, LCSWA Clinical Social Worker [facility]  Phone: [phone]

## 2018-12-07 NOTE — [facility] Group Notes (Signed)
Pt did not attend morning group.

## 2018-12-08 LAB — HEPATIC FUNCTION PANEL
ALT: 18 U/L (ref 0–44)
AST: 18 U/L (ref 15–41)
Albumin: 4.2 g/dL (ref 3.5–5.0)
Alkaline Phosphatase: 47 U/L (ref 38–126)
Bilirubin, Direct: <0.1 mg/dL (ref 0.0–0.2)
Total Bilirubin: 0.4 mg/dL (ref 0.3–1.2)
Total Protein: 7.4 g/dL (ref 6.5–8.1)

## 2018-12-08 LAB — LIPASE, BLOOD: Lipase: 42 U/L (ref 11–51)

## 2018-12-08 LAB — TSH: TSH: 2.418 u[IU]/mL (ref 0.350–4.500)

## 2018-12-08 LAB — AMYLASE: Amylase: 120 U/L — ABNORMAL HIGH (ref 28–100)

## 2018-12-08 MED ORDER — ONDANSETRON 4 MG PO TBDP
4.0000 mg | ORAL_TABLET | Freq: Four times a day (QID) | ORAL | Status: DC | PRN
  Administered 2018-12-08 – 2018-12-09 (×2): 4 mg via ORAL
  Filled 2018-12-08: qty 1

## 2018-12-08 MED ORDER — GABAPENTIN 300 MG PO CAPS
300.0000 mg | ORAL_CAPSULE | Freq: Three times a day (TID) | ORAL | Status: DC
  Administered 2018-12-08 – 2018-12-10 (×6): 300 mg via ORAL
  Filled 2018-12-08 (×12): qty 1

## 2018-12-08 MED ORDER — LOPERAMIDE HCL 2 MG PO CAPS
2.0000 mg | ORAL_CAPSULE | ORAL | Status: DC | PRN
  Administered 2018-12-08: 21:00:00 4 mg via ORAL
  Filled 2018-12-08: qty 2

## 2018-12-08 MED ORDER — TRAZODONE HCL 100 MG PO TABS
100.0000 mg | ORAL_TABLET | Freq: Every evening | ORAL | Status: DC | PRN
  Administered 2018-12-08: 100 mg via ORAL
  Filled 2018-12-08: qty 1

## 2018-12-08 MED ORDER — HYDROXYZINE HCL 25 MG PO TABS
25.0000 mg | ORAL_TABLET | Freq: Four times a day (QID) | ORAL | Status: DC | PRN
  Administered 2018-12-08 – 2018-12-09 (×3): 25 mg via ORAL
  Filled 2018-12-08 (×3): qty 1

## 2018-12-08 NOTE — Progress Notes (Signed)
[facility] MD Progress Note  12/08/2018 9:48 AM Shirley Murphy  MRN:  [id number] Subjective:  33 year old female, known to our unit from prior admissions, history of alcohol/cocaine abuse, history of mood disorder . Presents for depression, neuro-vegetative symptoms of depression, suicidal ideations with thoughts of jumping off a roof, also some irritability and violent thoughts of assaulting her BF and his ex wife. She stopped taking her psychiatric medications ( Zoloft, Neurontin ) 2 weeks ago ( states Zoloft has been very helpful to her and she feels " a lot better when I take it") and relapsed on alcohol and cocaine recently.  Objective: Patient is seen and examined.  Patient is a 33 year old female with the above-stated past psychiatric history who is seen in follow-up.  She remains irritable.  I re-added her Abilify last night, but it does not appear to have decreased this.  She also complains of nausea this morning, and that may be secondary to her withdrawal syndrome, but also because of abnormal liver function enzymes or the possibility of pancreatitis.  We discussed that this morning.  She does have Zofran already on her schedule to be able to take as needed for nausea.  We discussed rechecking her liver function enzymes as well as her pancreatic enzymes.  She stated she still feels significantly depressed, and we discussed the fact that with the substances that would probably be 6 to 8 weeks before things began to improve.  We also discussed the fact that she had been off her medications, and it would take 3 to 6 weeks for the Zoloft to really help.  Her vital signs are stable, she is afebrile.  Nursing reports show that she slept 6.75 hours last night, but the patient stated it was interrupted sleep and requested an increase in trazodone.  She stated she continues to have fleeting suicidal ideation, but no psychotic symptoms.  Principal Problem: <principal problem not specified> Diagnosis: Active  Problems:   MDD (major depressive disorder), recurrent, severe, with psychosis (HCC)  Total Time spent with patient: 15 minutes  Past Psychiatric History: See admission H&P  Past Medical History:  Past Medical History:  Diagnosis Date  . Anemia   . Anxiety   . Chest pain   . Depression   . Gallstones   . Hepatitis C   . Hypertension    patient reports only during detox  . Irregular heart beat     Past Surgical History:  Procedure Laterality Date  . c sections    . TUBAL LIGATION     Family History:  Family History  Problem Relation Age of Onset  . Hypertension Father   . Pancreatic cancer Father    Family Psychiatric  History: See admission H&P Social History:  Social History   Substance and Sexual Activity  Alcohol Use Yes  . Alcohol/week: 24.0 standard drinks  . Types: 24 Cans of beer per week   Comment: daily use recently     Social History   Substance and Sexual Activity  Drug Use Yes  . Types: Heroin, "Crack" cocaine   Comment: daily use of crack cocaine    Social History   Socioeconomic History  . Marital status: Legally Separated    Spouse name: Not on file  . Number of children: Not on file  . Years of education: Not on file  . Highest education level: Not on file  Occupational History  . Not on file  Social Needs  . Financial resource strain: Not on  file  . Food insecurity:    Worry: Not on file    Inability: Not on file  . Transportation needs:    Medical: Not on file    Non-medical: Not on file  Tobacco Use  . Smoking status: Current Every Day Smoker    Packs/day: 1.00    Types: Cigarettes  . Smokeless tobacco: Never Used  Substance and Sexual Activity  . Alcohol use: Yes    Alcohol/week: 24.0 standard drinks    Types: 24 Cans of beer per week    Comment: daily use recently  . Drug use: Yes    Types: Heroin, "Crack" cocaine    Comment: daily use of crack cocaine  . Sexual activity: Yes    Birth control/protection: None   Lifestyle  . Physical activity:    Days per week: Not on file    Minutes per session: Not on file  . Stress: Not on file  Relationships  . Social connections:    Talks on phone: Not on file    Gets together: Not on file    Attends religious service: Not on file    Active member of club or organization: Not on file    Attends meetings of clubs or organizations: Not on file    Relationship status: Not on file  Other Topics Concern  . Not on file  Social History Narrative  . Not on file   Additional Social History:                         Sleep: Fair  Appetite:  Fair  Current Medications: Current Facility-Administered Medications  Medication Dose Route Frequency Provider Last Rate Last Dose  . acetaminophen (TYLENOL) tablet 650 mg  650 mg Oral Q6H PRN Money, Gerlene Burdockravis B, FNP   650 mg at 12/06/18 0819  . alum & mag hydroxide-simeth (MAALOX/MYLANTA) 200-200-20 MG/5ML suspension 30 mL  30 mL Oral Q4H PRN Money, Gerlene Burdockravis B, FNP      . ARIPiprazole (ABILIFY) tablet 5 mg  5 mg Oral QHS Antonieta Pertlary, Aaryav Hopfensperger Lawson, MD   5 mg at 12/07/18 2138  . gabapentin (NEURONTIN) capsule 300 mg  300 mg Oral BID Cobos, Rockey SituFernando A, MD   300 mg at 12/08/18 0814  . hydrOXYzine (ATARAX/VISTARIL) tablet 25 mg  25 mg Oral Q6H PRN Money, Gerlene Burdockravis B, FNP   25 mg at 12/08/18 0814  . ibuprofen (ADVIL) tablet 400 mg  400 mg Oral Q6H PRN Cobos, Rockey SituFernando A, MD   400 mg at 12/08/18 0813  . loperamide (IMODIUM) capsule 2-4 mg  2-4 mg Oral PRN Cobos, Rockey SituFernando A, MD      . LORazepam (ATIVAN) tablet 1 mg  1 mg Oral Q6H PRN Cobos, Rockey SituFernando A, MD   1 mg at 12/06/18 2208  . magnesium hydroxide (MILK OF MAGNESIA) suspension 30 mL  30 mL Oral Daily PRN Money, Feliz Beamravis B, FNP   30 mL at 12/06/18 0929  . multivitamin with minerals tablet 1 tablet  1 tablet Oral Daily Cobos, Rockey SituFernando A, MD   1 tablet at 12/08/18 0814  . nicotine (NICODERM CQ - dosed in mg/24 hours) patch 21 mg  21 mg Transdermal Daily Cobos, Rockey SituFernando A, MD   21 mg at  12/07/18 0801  . ondansetron (ZOFRAN-ODT) disintegrating tablet 4 mg  4 mg Oral Q6H PRN Cobos, Rockey SituFernando A, MD   4 mg at 12/08/18 0813  . sertraline (ZOLOFT) tablet 100 mg  100 mg Oral Daily  Cobos, Rockey Situ, MD   100 mg at 12/08/18 1191  . thiamine (B-1) injection 100 mg  100 mg Intramuscular Once Cobos, Fernando A, MD      . thiamine (VITAMIN B-1) tablet 100 mg  100 mg Oral Daily Cobos, Rockey Situ, MD   100 mg at 12/08/18 0814  . traZODone (DESYREL) tablet 100 mg  100 mg Oral QHS PRN Antonieta Pert, MD        Lab Results: No results found for this or any previous visit (from the past 48 hour(s)).  Blood Alcohol level:  Lab Results  Component Value Date   ETH <10 09/17/2018   ETH 13 (H) 02/27/2018    Metabolic Disorder Labs: No results found for: HGBA1C, MPG No results found for: PROLACTIN No results found for: CHOL, TRIG, HDL, CHOLHDL, VLDL, LDLCALC  Physical Findings: AIMS: Facial and Oral Movements Muscles of Facial Expression: None, normal Lips and Perioral Area: None, normal Jaw: None, normal Tongue: None, normal,Extremity Movements Upper (arms, wrists, hands, fingers): None, normal Lower (legs, knees, ankles, toes): None, normal, Trunk Movements Neck, shoulders, hips: None, normal, Overall Severity Severity of abnormal movements (highest score from questions above): None, normal Incapacitation due to abnormal movements: None, normal Patient's awareness of abnormal movements (rate only patient's report): No Awareness, Dental Status Current problems with teeth and/or dentures?: No Does patient usually wear dentures?: No  CIWA:  CIWA-Ar Total: 0 COWS:  COWS Total Score: 4  Musculoskeletal: Strength & Muscle Tone: within normal limits Gait & Station: normal Patient leans: N/A  Psychiatric Specialty Exam: Physical Exam  Nursing note and vitals reviewed. Constitutional: She is oriented to person, place, and time. She appears well-developed and well-nourished.   HENT:  Head: Normocephalic and atraumatic.  Respiratory: Effort normal.  Neurological: She is alert and oriented to person, place, and time.    ROS  Blood pressure 124/70, pulse 78, temperature 98.3 F (36.8 C), temperature source Oral, resp. rate 20, height  (1.397 m), weight 114.8 kg, last menstrual period 11/13/2018.Body mass index is 58.8 kg/m.  General Appearance: Disheveled  Eye Contact:  Fair  Speech:  Normal Rate  Volume:  Normal  Mood:  Irritable  Affect:  Congruent  Thought Process:  Coherent and Descriptions of Associations: Intact  Orientation:  Full (Time, Place, and Person)  Thought Content:  Logical  Suicidal Thoughts:  Yes.  without intent/plan  Homicidal Thoughts:  No  Memory:  Immediate;   Fair Recent;   Fair Remote;   Fair  Judgement:  Intact  Insight:  Lacking  Psychomotor Activity:  Increased  Concentration:  Concentration: Fair and Attention Span: Fair  Recall:  [employer] of Knowledge:  Fair  Language:  Good  Akathisia:  Negative  Handed:  Right  AIMS (if indicated):     Assets:  Desire for Improvement Resilience  ADL's:  Intact  Cognition:  WNL  Sleep:  Number of Hours: 6.75     Treatment Plan Summary: Daily contact with patient to assess and evaluate symptoms and progress in treatment, Medication management and Plan : Patient is seen and examined.  Patient is a 33 year old female with the above-stated past psychiatric history who is seen in follow-up.   Diagnosis #1 cocaine dependence, #2 substance-induced mood disorder, #3 history of posttraumatic stress disorder, #4 reported history of bipolar disorder, #5 unspecified history of thyroid issues, #6 history of alcohol use disorder/dependence.  Patient remains significantly irritable, but a little bit less so than yesterday.  Her  vital signs are stable and she is afebrile.  Her CIWA is been low, and it does not appear as though the irritability is necessarily due to withdrawal.  We will  continue her medications as is.  She does have a reported history of liver problems, and I am going to recheck her liver function enzymes as well as her pancreatic enzymes in case she is developing some form pancreatitis.  She did request an increase in her trazodone for sleep, and I will increase that up to 100 mg p.o. daily.  It does not appear that her TSH has been processed, so I will re-request that.  No other changes in her current medications. 1.  Increase Neurontin to 300 mg p.o. 3 times daily for mood stability, chronic pain and anxiety. 2.  Continue hydroxyzine 25 mg p.o. every 6 hours as needed anxiety. 3.  Continue lorazepam 1 mg p.o. every 6 hours PRN CIWA greater than 10. 4.  Continue sertraline 100 mg p.o. daily for anxiety and depression. 5.  Continue thiamine 100 mg p.o. daily for nutritional supplementation. 6.  Increase trazodone 200 mg p.o. nightly as needed insomnia. 7.  Check LFTs, TSH and amylase and lipase. 8.  Disposition planning-in progress.  Antonieta Pert, MD 12/08/2018, 9:48 AM

## 2018-12-08 NOTE — Progress Notes (Signed)
Adult Psychoeducational Group Note  Date:  12/08/2018 Time:  11:53 PM  Group Topic/Focus:  Wrap-Up Group:   The focus of this group is to help patients review their daily goal of treatment and discuss progress on daily workbooks.  Participation Level:  Active  Participation Quality:  Appropriate  Affect:  Appropriate  Cognitive:  Appropriate  Insight: Appropriate  Engagement in Group:  Engaged  Modes of Intervention:  Discussion  Additional Comments:  Pt stated his goal was to stay awake at least half of the day.  Pt stated she did meet her goal.  Pt stated she enjoyed going outside, playing football and doing push ups.  Pt rated the day at a 10/10.  Shirley Murphy 12/08/2018, 11:53 PM

## 2018-12-08 NOTE — Progress Notes (Signed)
Recreation Therapy Notes  Date:  4.22.20 Time: 0930 Location: [address] Dayroom  Group Topic: Stress Management  Goal Area(s) Addresses:  Patient will identify positive stress management techniques. Patient will identify benefits of using stress management post d/c.  Intervention:  Stress Management  Activity :  Guided Imagery.  LRT introduced the stress management technique of guided imagery.  LRT read a script that allowed patients to envision their peaceful place.  Patients were to listen and follow as script was read to engage in activity.  Education:  Stress Management, Discharge Planning.   Education Outcome: Acknowledges Education  Clinical Observations/Feedback:  Pt did not attend group.     Kamille Toomey, LRT/CTRS          Rhea Thrun A 12/08/2018 10:35 AM 

## 2018-12-08 NOTE — Progress Notes (Signed)
Dar Note: Patient presents with irritable affect and mood.  Denies suicidal thoughts, auditory and visual hallucinations.  Medication given as prescribed.  Continues to request Ativan but does not presents with any withdrawal symptoms.  Routine safety checks maintained every 15 minutes.  Patient is safe on the unit.

## 2018-12-08 NOTE — Progress Notes (Signed)
D    Pt complained of nausea vomitting diarrhea and body aches and pains   Her temp was 99.5    All of her PRN protocal medications were discontinued    She is pleasant on approach and cooperative   She is visible on the milieu and her interactions and behavior are appropriate A   Verbal support given    Medications administered and effectiveness monitored   Q 15 min checks  Received orders for symptoms mentioned above R   Pt remains safe and did get some relief for her symptoms  [place] NOVEL CORONAVIRUS (COVID-19) DAILY CHECK-OFF SYMPTOMS - answer yes or no to each - every day NO YES  Have you had a fever in the past 24 hours?  . Fever (Temp > 37.80C / 100F) X   Have you had any of these symptoms in the past 24 hours? . New Cough .  Sore Throat  .  Shortness of Breath .  Difficulty Breathing .  Unexplained Body Aches   X   Have you had any one of these symptoms in the past 24 hours not related to allergies?   . Runny Nose .  Nasal Congestion .  Sneezing   X   If you have had runny nose, nasal congestion, sneezing in the past 24 hours, has it worsened?  X   EXPOSURES - check yes or no X   Have you traveled outside the state in the past 14 days?  X   Have you been in contact with someone with a confirmed diagnosis of COVID-19 or PUI in the past 14 days without wearing appropriate PPE?  X   Have you been living in the same home as a person with confirmed diagnosis of COVID-19 or a PUI (household contact)?    X   Have you been diagnosed with COVID-19?    X              What to do next: Answered NO to all: Answered YES to anything:   Proceed with unit schedule Follow the [facility] Inpatient Flowsheet.

## 2018-12-09 MED ORDER — TRAZODONE HCL 150 MG PO TABS
150.0000 mg | ORAL_TABLET | Freq: Every evening | ORAL | Status: DC | PRN
  Administered 2018-12-09: 22:00:00 150 mg via ORAL
  Filled 2018-12-09: qty 1

## 2018-12-09 NOTE — Progress Notes (Signed)
[facility] MD Progress Note  12/09/2018 8:13 AM Shirley Murphy  MRN:  [id number] Subjective:  33 year old female, known to our unit from prior admissions, history of alcohol/cocaine abuse, history of mood disorder . Presents for depression, neuro-vegetative symptoms of depression, suicidal ideations with thoughts of jumping off a roof, also some irritability and violent thoughts of assaulting her BF and his ex wife. She stopped taking her psychiatric medications ( Zoloft, Neurontin ) 2 weeks ago ( states Zoloft has been very helpful to her and she feels " a lot better when I take it") and relapsed on alcohol and cocaine recently.  Objective: Patient is seen and examined.  Patient is a 33 year old female with the above-stated past psychiatric history seen in follow-up.  She is slowly improving.  She is much less irritable this morning.  She slept better last night.  She had significant nausea yesterday, but she stated that was getting better.  Her laboratories revealed a mildly increased amylase and essentially normal lipase.  Her liver function enzymes were normal.  Her blood pressure stable at 115/56, pulse is 86, and she is afebrile.  She slept approximately 6 hours last night.  She denied any suicidal or homicidal ideation.  No auditory or visual hallucinations.  Principal Problem: <principal problem not specified> Diagnosis: Active Problems:   MDD (major depressive disorder), recurrent, severe, with psychosis (HCC)  Total Time spent with patient: 15 minutes  Past Psychiatric History: See admission H&P  Past Medical History:  Past Medical History:  Diagnosis Date  . Anemia   . Anxiety   . Chest pain   . Depression   . Gallstones   . Hepatitis C   . Hypertension    patient reports only during detox  . Irregular heart beat     Past Surgical History:  Procedure Laterality Date  . c sections    . TUBAL LIGATION     Family History:  Family History  Problem Relation Age of Onset  .  Hypertension Father   . Pancreatic cancer Father    Family Psychiatric  History: See admission H&P Social History:  Social History   Substance and Sexual Activity  Alcohol Use Yes  . Alcohol/week: 24.0 standard drinks  . Types: 24 Cans of beer per week   Comment: daily use recently     Social History   Substance and Sexual Activity  Drug Use Yes  . Types: Heroin, "Crack" cocaine   Comment: daily use of crack cocaine    Social History   Socioeconomic History  . Marital status: Legally Separated    Spouse name: Not on file  . Number of children: Not on file  . Years of education: Not on file  . Highest education level: Not on file  Occupational History  . Not on file  Social Needs  . Financial resource strain: Not on file  . Food insecurity:    Worry: Not on file    Inability: Not on file  . Transportation needs:    Medical: Not on file    Non-medical: Not on file  Tobacco Use  . Smoking status: Current Every Day Smoker    Packs/day: 1.00    Types: Cigarettes  . Smokeless tobacco: Never Used  Substance and Sexual Activity  . Alcohol use: Yes    Alcohol/week: 24.0 standard drinks    Types: 24 Cans of beer per week    Comment: daily use recently  . Drug use: Yes    Types: Heroin, "  Crack" cocaine    Comment: daily use of crack cocaine  . Sexual activity: Yes    Birth control/protection: None  Lifestyle  . Physical activity:    Days per week: Not on file    Minutes per session: Not on file  . Stress: Not on file  Relationships  . Social connections:    Talks on phone: Not on file    Gets together: Not on file    Attends religious service: Not on file    Active member of club or organization: Not on file    Attends meetings of clubs or organizations: Not on file    Relationship status: Not on file  Other Topics Concern  . Not on file  Social History Narrative  . Not on file   Additional Social History:                         Sleep:  Fair  Appetite:  Fair  Current Medications: Current Facility-Administered Medications  Medication Dose Route Frequency Provider Last Rate Last Dose  . acetaminophen (TYLENOL) tablet 650 mg  650 mg Oral Q6H PRN Money, Gerlene Burdock, FNP   650 mg at 12/06/18 0819  . alum & mag hydroxide-simeth (MAALOX/MYLANTA) 200-200-20 MG/5ML suspension 30 mL  30 mL Oral Q4H PRN Money, Gerlene Burdock, FNP      . ARIPiprazole (ABILIFY) tablet 5 mg  5 mg Oral QHS Antonieta Pert, MD   5 mg at 12/08/18 2159  . gabapentin (NEURONTIN) capsule 300 mg  300 mg Oral TID Antonieta Pert, MD   300 mg at 12/09/18 0810  . hydrOXYzine (ATARAX/VISTARIL) tablet 25 mg  25 mg Oral Q6H PRN Kerry Hough, PA-C   25 mg at 12/08/18 2041  . ibuprofen (ADVIL) tablet 400 mg  400 mg Oral Q6H PRN Cobos, Rockey Situ, MD   400 mg at 12/09/18 2778  . loperamide (IMODIUM) capsule 2-4 mg  2-4 mg Oral PRN Donell Sievert E, PA-C   4 mg at 12/08/18 2041  . magnesium hydroxide (MILK OF MAGNESIA) suspension 30 mL  30 mL Oral Daily PRN Money, Feliz Beam B, FNP   30 mL at 12/06/18 0929  . multivitamin with minerals tablet 1 tablet  1 tablet Oral Daily Cobos, Rockey Situ, MD   1 tablet at 12/09/18 0810  . nicotine (NICODERM CQ - dosed in mg/24 hours) patch 21 mg  21 mg Transdermal Daily Cobos, Rockey Situ, MD   21 mg at 12/09/18 0811  . ondansetron (ZOFRAN-ODT) disintegrating tablet 4 mg  4 mg Oral Q6H PRN Kerry Hough, PA-C   4 mg at 12/09/18 2423  . sertraline (ZOLOFT) tablet 100 mg  100 mg Oral Daily Cobos, Rockey Situ, MD   100 mg at 12/09/18 0810  . thiamine (B-1) injection 100 mg  100 mg Intramuscular Once Cobos, Fernando A, MD      . thiamine (VITAMIN B-1) tablet 100 mg  100 mg Oral Daily Cobos, Rockey Situ, MD   100 mg at 12/09/18 0810  . traZODone (DESYREL) tablet 100 mg  100 mg Oral QHS PRN Antonieta Pert, MD   100 mg at 12/08/18 2159    Lab Results:  Results for orders placed or performed during the hospital encounter of 12/05/18 (from the  past 48 hour(s))  Hepatic function panel     Status: None   Collection Time: 12/08/18  6:08 PM  Result Value Ref Range   Total Protein  7.4 6.5 - 8.1 g/dL   Albumin 4.2 3.5 - 5.0 g/dL   AST 18 15 - 41 U/L   ALT 18 0 - 44 U/L   Alkaline Phosphatase 47 38 - 126 U/L   Total Bilirubin 0.4 0.3 - 1.2 mg/dL   Bilirubin, Direct <1.6<0.1 0.0 - 0.2 mg/dL   Indirect Bilirubin NOT CALCULATED 0.3 - 0.9 mg/dL    Comment: Performed at [facility], [address] [place], [place] [address]  Amylase     Status: Abnormal   Collection Time: 12/08/18  6:08 PM  Result Value Ref Range   Amylase 120 (H) 28 - 100 U/L    Comment: Performed at [facility], [address] [place], [place] [address]  Lipase, blood     Status: None   Collection Time: 12/08/18  6:08 PM  Result Value Ref Range   Lipase 42 11 - 51 U/L    Comment: Performed at [facility], [address] [place], [place] [address]  TSH     Status: None   Collection Time: 12/08/18  6:08 PM  Result Value Ref Range   TSH 2.418 0.350 - 4.500 uIU/mL    Comment: Performed by a 3rd Generation assay with a functional sensitivity of <=0.01 uIU/mL. Performed at [facility], [address] [place], [place] [address]     Blood Alcohol level:  Lab Results  Component Value Date   ETH <10 09/17/2018   ETH 13 (H) 02/27/2018    Metabolic Disorder Labs: No results found for: HGBA1C, MPG No results found for: PROLACTIN No results found for: CHOL, TRIG, HDL, CHOLHDL, VLDL, LDLCALC  Physical Findings: AIMS: Facial and Oral Movements Muscles of Facial Expression: None, normal Lips and Perioral Area: None, normal Jaw: None, normal Tongue: None, normal,Extremity Movements Upper (arms, wrists, hands, fingers): None, normal Lower (legs, knees, ankles, toes): None, normal, Trunk Movements Neck, shoulders, hips: None, normal, Overall Severity Severity of abnormal movements  (highest score from questions above): None, normal Incapacitation due to abnormal movements: None, normal Patient's awareness of abnormal movements (rate only patient's report): No Awareness, Dental Status Current problems with teeth and/or dentures?: No Does patient usually wear dentures?: No  CIWA:  CIWA-Ar Total: 0 COWS:  COWS Total Score: 4  Musculoskeletal: Strength & Muscle Tone: within normal limits Gait & Station: normal Patient leans: N/A  Psychiatric Specialty Exam: Physical Exam  Nursing note and vitals reviewed. Constitutional: She is oriented to person, place, and time. She appears well-developed and well-nourished.  HENT:  Head: Normocephalic and atraumatic.  Respiratory: Effort normal.  Neurological: She is alert and oriented to person, place, and time.    ROS  Blood pressure (!) 115/56, pulse 86, temperature 98.8 F (37.1 C), temperature source Oral, resp. rate 20, height 4\' 7"  (1.397 m), weight 114.8 kg, last menstrual period 11/13/2018, SpO2 99 %.Body mass index is 58.8 kg/m.  General Appearance: Casual  Eye Contact:  Fair  Speech:  Normal Rate  Volume:  Normal  Mood:  Anxious and Dysphoric  Affect:  Congruent  Thought Process:  Coherent and Descriptions of Associations: Intact  Orientation:  Full (Time, Place, and Person)  Thought Content:  Logical  Suicidal Thoughts:  No  Homicidal Thoughts:  No  Memory:  Immediate;   Fair Recent;   Fair Remote;   Fair  Judgement:  Intact  Insight:  Fair  Psychomotor Activity:  Normal  Concentration:  Concentration: Fair and Attention Span: Fair  Recall:  Jennelle Human of Knowledge:  Fair  Language:  Fair  Akathisia:  Negative  Handed:  Right  AIMS (if indicated):     Assets:  Desire for Improvement Resilience  ADL's:  Intact  Cognition:  WNL  Sleep:  Number of Hours: 5.75     Treatment Plan Summary: Daily contact with patient to assess and evaluate symptoms and progress in treatment, Medication management  and Plan : Patient is seen and examined.  Patient is a 33 year old female with the above-stated past psychiatric history was seen in follow-up.   Diagnosis: #1 cocaine dependence, #2 substance-induced mood disorder, #3 history of posttraumatic stress disorder, #4 reported history of bipolar disorder, #5 unspecified history of thyroid issues, #6 history of alcohol use disorder/dependence.  Patient is much less irritable today, and her nausea is improving.  She is pleasant and able to smile and engage.  Her sleep was a bit better last night.  No change in her current medications.  Hopefully she will continue to improve.  Her LFTs were normal, and her pancreatic enzymes were slightly elevated.  With regard to her thyroid, her TSH came back at 2.418.  This is within normal limits. 1.  Continue Abilify 5 mg p.o. nightly for mood stability. 2.  Continue gabapentin 300 mg p.o. 3 times daily for chronic pain and mood stability. 3.  Continue Zoloft 100 mg p.o. daily for anxiety and mood. 4.  Continue thiamine 100 mg p.o. daily for nutritional supplementation. 5.  Increase trazodone 250 mg p.o. nightly as needed insomnia. 6.  Disposition planning-in progress.  Antonieta Pert, MD 12/09/2018, 8:13 AM

## 2018-12-09 NOTE — Progress Notes (Signed)
DAR NOTE: Patient presents less irritable but with anxious affect and mood.  Denies suicidal thoughts, auditory and visual hallucinations.  Described energy level as low and concentration as poor.  Reports withdrawal symptoms of diarrhea, cravings, runny nose, chilling, cramping, nausea and irritability on self inventory form.  Rates depression at 6, hopelessness at 6, and anxiety at 8.  Maintained on routine safety checks.  Medications given as prescribed.  Support and encouragement offered as needed.  Attended group and participated.  States goal for today is "be positive."  Patient observed socializing with peers in the dayroom.  Patient is safe on the unit.

## 2018-12-09 NOTE — Progress Notes (Signed)
Adult Psychoeducational Group Note  Date:  12/09/2018 Time:  8:46 PM  Group Topic/Focus:  Wrap-Up Group:   The focus of this group is to help patients review their daily goal of treatment and discuss progress on daily workbooks.  Participation Level:  Active  Participation Quality:  Appropriate  Affect:  Appropriate  Cognitive:  Appropriate  Insight: Appropriate  Engagement in Group:  Engaged  Modes of Intervention:  Discussion  Additional Comments:  Pt stated her goal was to be more positive and pt did meet her goal.  Pt stated she was able to go to they gym and exercise which really helped with staying positive.  Pt rated the day at a 10/10.  Kristine Linea 12/09/2018, 8:46 PM

## 2018-12-09 NOTE — [facility] Group Notes (Signed)
LCSW Group Therapy Note  12/09/2018 2:18 PM  Type of Therapy/Topic: Group Therapy: Feelings about Diagnosis  Participation Level: Active   Description of Group:  This group will allow patients to explore their thoughts and feelings about diagnoses they have received. Patients will be guided to explore their level of understanding and acceptance of these diagnoses. Facilitator will encourage patients to process their thoughts and feelings about the reactions of others to their diagnosis and will guide patients in identifying ways to discuss their diagnosis with significant others in their lives. This group will be process-oriented, with patients participating in exploration of their own experiences, giving and receiving support, and processing challenge from other group members.  Therapeutic Goals: 1. Patient will demonstrate understanding of diagnosis as evidenced by identifying two or more symptoms of the disorder 2. Patient will be able to express two feelings regarding the diagnosis 3. Patient will demonstrate their ability to communicate their needs through discussion and/or role play  Summary of Patient Progress: Patient remained present and attentive throughout the entire group. Patient shared that having a mental health or substance use diagnosis can feel disappointing in regard to relapse(s). She shared that she feels she is only able to maintain sobriety by "keeping busy" or being in a residential facility. Patient shared that her boyfriend is supportive of her seeking treatment for her mental health needs, but not substance use treatment. She expressed interest in entering residential treatment "on her own," specifically [facility] in [place].   Therapeutic Modalities:  Cognitive Behavioral Therapy Brief Therapy Feelings Identification   Enid Cutter, MSW, [employer] Clinical Social Worker

## 2018-12-10 DIAGNOSIS — F1910 Other psychoactive substance abuse, uncomplicated: Secondary | ICD-10-CM

## 2018-12-10 MED ORDER — HYDROXYZINE HCL 25 MG PO TABS: 25.0000 mg | ORAL_TABLET | Freq: Four times a day (QID) | ORAL | 0 refills | Status: AC | PRN

## 2018-12-10 MED ORDER — TRAZODONE HCL 150 MG PO TABS: 150.0000 mg | ORAL_TABLET | Freq: Every evening | ORAL | 0 refills | Status: AC | PRN

## 2018-12-10 MED ORDER — ARIPIPRAZOLE 5 MG PO TABS: 5.0000 mg | ORAL_TABLET | Freq: Every day | ORAL | 0 refills | Status: AC

## 2018-12-10 MED ORDER — GABAPENTIN 300 MG PO CAPS: 300.0000 mg | ORAL_CAPSULE | Freq: Three times a day (TID) | ORAL | 0 refills | Status: AC

## 2018-12-10 MED ORDER — NICOTINE 21 MG/24HR TD PT24: 21.0000 mg | MEDICATED_PATCH | Freq: Every day | TRANSDERMAL | 0 refills | Status: AC

## 2018-12-10 MED ORDER — SERTRALINE HCL 100 MG PO TABS: 100.0000 mg | ORAL_TABLET | Freq: Every day | ORAL | 0 refills | Status: AC

## 2018-12-10 MED ORDER — ADULT MULTIVITAMIN W/MINERALS CH: 1.0000 | ORAL_TABLET | Freq: Every day | ORAL | Status: AC

## 2018-12-10 NOTE — [facility] Suicide Risk Assessment (Signed)
[facility] Discharge Suicide Risk Assessment   Principal Problem: <principal problem not specified> Discharge Diagnoses: Active Problems:   MDD (major depressive disorder), recurrent, severe, with psychosis (HCC)   Total Time spent with patient: 15 minutes  Musculoskeletal: Strength & Muscle Tone: within normal limits Gait & Station: normal Patient leans: N/A  Psychiatric Specialty Exam: Review of Systems  All other systems reviewed and are negative.   Blood pressure 104/81, pulse 75, temperature 98.4 F (36.9 C), temperature source Oral, resp. rate 20, height 4\' 7"  (1.397 m), weight 114.8 kg, last menstrual period 11/13/2018, SpO2 99 %.Body mass index is 58.8 kg/m.  General Appearance: Casual  Eye Contact::  Good  Speech:  Normal Rate409  Volume:  Normal  Mood:  Euthymic  Affect:  Congruent  Thought Process:  Coherent and Descriptions of Associations: Intact  Orientation:  Full (Time, Place, and Person)  Thought Content:  Logical  Suicidal Thoughts:  No  Homicidal Thoughts:  No  Memory:  Immediate;   Fair Recent;   Fair Remote;   Fair  Judgement:  Intact  Insight:  Fair  Psychomotor Activity:  Normal  Concentration:  Fair  Recall:  [employer] of Knowledge:Negative  Language: Good  Akathisia:  Negative  Handed:  Right  AIMS (if indicated):     Assets:  Desire for Improvement Resilience  Sleep:  Number of Hours: 5.75  Cognition: WNL  ADL's:  Intact   Mental Status Per Nursing Assessment::   On Admission:  Suicidal ideation indicated by patient, Suicide plan, Self-harm thoughts, Self-harm behaviors, Plan includes specific time, place, or method, Suicidal ideation indicated by others, Belief that plan would result in death, Plan to harm others, Thoughts of violence towards others, Intention to act on suicide plan, Intention to act on plan to harm others  Demographic Factors:  Divorced or widowed, Caucasian, Low socioeconomic status, Living alone and Unemployed  Loss  Factors: Financial problems/change in socioeconomic status  Historical Factors: Impulsivity  Risk Reduction Factors:   Positive coping skills or problem solving skills  Continued Clinical Symptoms:  Depression:   Comorbid alcohol abuse/dependence Impulsivity Alcohol/Substance Abuse/Dependencies  Cognitive Features That Contribute To Risk:  None    Suicide Risk:  Minimal: No identifiable suicidal ideation.  Patients presenting with no risk factors but with morbid ruminations; may be classified as minimal risk based on the severity of the depressive symptoms  Follow-up Information    [facility] Follow up.   Specialty:  Professional Counselor Why:  Please complete intake paperwork online and email it to [email or web address]. Go to For Clients, and Intake Forms. Office will contact you for a medication management appointment. Contact information: [facility] [address] [place] [address] [phone]        [facility] Follow up on 12/16/2018.   Why:  Hospital follow up appointment is Thursday, 4/30 at 9:00a.  The appointment will be held over the phone. The provider will contact you.  Contact information: [address] [place] [address] [phone]           Plan Of Care/Follow-up recommendations:  Activity:  ad lib  Antonieta Pert, MD 12/10/2018, 9:30 AM

## 2018-12-10 NOTE — Tx Team (Signed)
Interdisciplinary Treatment and Diagnostic Plan Update  12/10/2018 Time of Session: 9:00am Shirley Murphy MRN: [id number]  Principal Diagnosis: <principal problem not specified>  Secondary Diagnoses: Active Problems:   MDD (major depressive disorder), recurrent, severe, with psychosis (HCC)   Current Medications:  Current Facility-Administered Medications  Medication Dose Route Frequency Provider Last Rate Last Dose  . acetaminophen (TYLENOL) tablet 650 mg  650 mg Oral Q6H PRN Money, Gerlene Burdock, FNP   650 mg at 12/06/18 0819  . alum & mag hydroxide-simeth (MAALOX/MYLANTA) 200-200-20 MG/5ML suspension 30 mL  30 mL Oral Q4H PRN Money, Gerlene Burdock, FNP      . ARIPiprazole (ABILIFY) tablet 5 mg  5 mg Oral QHS Antonieta Pert, MD   5 mg at 12/09/18 2129  . gabapentin (NEURONTIN) capsule 300 mg  300 mg Oral TID Antonieta Pert, MD   300 mg at 12/09/18 1809  . hydrOXYzine (ATARAX/VISTARIL) tablet 25 mg  25 mg Oral Q6H PRN Kerry Hough, PA-C   25 mg at 12/09/18 1810  . ibuprofen (ADVIL) tablet 400 mg  400 mg Oral Q6H PRN Cobos, Rockey Situ, MD   400 mg at 12/09/18 2214  . loperamide (IMODIUM) capsule 2-4 mg  2-4 mg Oral PRN Donell Sievert E, PA-C   4 mg at 12/08/18 2041  . magnesium hydroxide (MILK OF MAGNESIA) suspension 30 mL  30 mL Oral Daily PRN Money, Feliz Beam B, FNP   30 mL at 12/06/18 0929  . multivitamin with minerals tablet 1 tablet  1 tablet Oral Daily Cobos, Rockey Situ, MD   1 tablet at 12/09/18 0810  . nicotine (NICODERM CQ - dosed in mg/24 hours) patch 21 mg  21 mg Transdermal Daily Cobos, Rockey Situ, MD   21 mg at 12/09/18 0811  . ondansetron (ZOFRAN-ODT) disintegrating tablet 4 mg  4 mg Oral Q6H PRN Kerry Hough, PA-C   4 mg at 12/09/18 7782  . sertraline (ZOLOFT) tablet 100 mg  100 mg Oral Daily Cobos, Rockey Situ, MD   100 mg at 12/09/18 0810  . thiamine (B-1) injection 100 mg  100 mg Intramuscular Once Cobos, Fernando A, MD      . thiamine (VITAMIN B-1) tablet 100 mg  100  mg Oral Daily Cobos, Rockey Situ, MD   100 mg at 12/09/18 0810  . traZODone (DESYREL) tablet 150 mg  150 mg Oral QHS PRN Antonieta Pert, MD   150 mg at 12/09/18 2215   PTA Medications: Medications Prior to Admission  Medication Sig Dispense Refill Last Dose  . ferrous sulfate 325 (65 FE) MG EC tablet Take 325 mg by mouth daily with breakfast.     . ibuprofen (ADVIL) 800 MG tablet Take 800 mg by mouth 3 (three) times daily as needed.     . traZODone (DESYREL) 150 MG tablet Take 150 mg by mouth at bedtime as needed for sleep.     Marland Kitchen gabapentin (NEURONTIN) 300 MG capsule Take 300 mg by mouth 3 (three) times daily.   Past Month at Unknown time  . naproxen (NAPROSYN) 500 MG tablet Take 1 tablet (500 mg total) by mouth 2 (two) times daily as needed (aching, pain, or discomfort). (Patient not taking: Reported on 12/04/2018) 20 tablet 0 Completed Course at Unknown time  . nicotine (NICODERM CQ - DOSED IN MG/24 HOURS) 21 mg/24hr patch Place 1 patch (21 mg total) onto the skin daily. For smoking cessation (Patient not taking: Reported on 12/04/2018) 28 patch 0 Not Taking at Unknown  time  . ondansetron (ZOFRAN ODT) 8 MG disintegrating tablet Take 1 tablet (8 mg total) by mouth every 8 (eight) hours as needed for nausea or vomiting. (Patient not taking: Reported on 12/04/2018) 20 tablet 0 Completed Course at Unknown time  . sertraline (ZOLOFT) 100 MG tablet Take 100 mg by mouth daily.   Past Month at Unknown time  . traZODone (DESYREL) 50 MG tablet Take 1 tablet (50 mg total) by mouth at bedtime as needed and may repeat dose one time if needed for sleep. 30 tablet 0 Past Month at Unknown time    Patient Stressors: Financial difficulties Marital or family conflict Substance abuse  Patient Strengths: Ability for insight General fund of knowledge  Treatment Modalities: Medication Management, Group therapy, Case management,  1 to 1 session with clinician, Psychoeducation, Recreational therapy.   Physician  Treatment Plan for Primary Diagnosis: <principal problem not specified> Long Term Goal(s): Improvement in symptoms so as ready for discharge Improvement in symptoms so as ready for discharge   Short Term Goals: Ability to identify triggers associated with substance abuse/mental health issues will improve Ability to identify changes in lifestyle to reduce recurrence of condition will improve Ability to verbalize feelings will improve Ability to disclose and discuss suicidal ideas Ability to demonstrate self-control will improve Ability to identify and develop effective coping behaviors will improve Ability to maintain clinical measurements within normal limits will improve  Medication Management: Evaluate patient's response, side effects, and tolerance of medication regimen.  Therapeutic Interventions: 1 to 1 sessions, Unit Group sessions and Medication administration.  Evaluation of Outcomes: Adequate for Discharge  Physician Treatment Plan for Secondary Diagnosis: Active Problems:   MDD (major depressive disorder), recurrent, severe, with psychosis (HCC)  Long Term Goal(s): Improvement in symptoms so as ready for discharge Improvement in symptoms so as ready for discharge   Short Term Goals: Ability to identify triggers associated with substance abuse/mental health issues will improve Ability to identify changes in lifestyle to reduce recurrence of condition will improve Ability to verbalize feelings will improve Ability to disclose and discuss suicidal ideas Ability to demonstrate self-control will improve Ability to identify and develop effective coping behaviors will improve Ability to maintain clinical measurements within normal limits will improve     Medication Management: Evaluate patient's response, side effects, and tolerance of medication regimen.  Therapeutic Interventions: 1 to 1 sessions, Unit Group sessions and Medication administration.  Evaluation of Outcomes:  Adequate for Discharge   RN Treatment Plan for Primary Diagnosis: <principal problem not specified> Long Term Goal(s): Knowledge of disease and therapeutic regimen to maintain health will improve  Short Term Goals: Ability to demonstrate self-control, Ability to identify and develop effective coping behaviors will improve and Compliance with prescribed medications will improve  Medication Management: RN will administer medications as ordered by provider, will assess and evaluate patient's response and provide education to patient for prescribed medication. RN will report any adverse and/or side effects to prescribing provider.  Therapeutic Interventions: 1 on 1 counseling sessions, Psychoeducation, Medication administration, Evaluate responses to treatment, Monitor vital signs and CBGs as ordered, Perform/monitor CIWA, COWS, AIMS and Fall Risk screenings as ordered, Perform wound care treatments as ordered.  Evaluation of Outcomes: Adequate for Discharge   LCSW Treatment Plan for Primary Diagnosis: <principal problem not specified> Long Term Goal(s): Safe transition to appropriate next level of care at discharge, Engage patient in therapeutic group addressing interpersonal concerns.  Short Term Goals: Engage patient in aftercare planning with referrals and resources, Increase  social support, Identify triggers associated with mental health/substance abuse issues and Increase skills for wellness and recovery  Therapeutic Interventions: Assess for all discharge needs, 1 to 1 time with Social worker, Explore available resources and support systems, Assess for adequacy in community support network, Educate family and significant other(s) on suicide prevention, Complete Psychosocial Assessment, Interpersonal group therapy.  Evaluation of Outcomes: Adequate for Discharge   Progress in Treatment: Attending groups: Yes. Participating in groups: Yes. Taking medication as prescribed:  Yes. Toleration medication: Yes. Family/Significant other contact made: Yes, individual(s) contacted:  patient declined consents, SPE reviewed with patient. Patient understands diagnosis: Yes. Discussing patient identified problems/goals with staff: Yes. Medical problems stabilized or resolved: Yes. Denies suicidal/homicidal ideation: Yes. Issues/concerns per patient self-inventory: Yes.  New problem(s) identified: Yes, Describe:  limited social supports, limited financial resources, voicing plans to harm her ex-boyfriend  New Short Term/Long Term Goal(s): detox, medication management for mood stabilization; elimination of SI thoughts; development of comprehensive mental wellness/sobriety plan.  Patient Goals: "Get back my mental stability"  Discharge Plan or Barriers: Return home, follow up with outpatient providers.  Reason for Continuation of Hospitalization: Anxiety Depression  Estimated Length of Stay: discharge today   Attendees: Patient: Shirley Murphy 12/10/2018 8:51 AM  Physician:  12/10/2018 8:51 AM  Nursing:  12/10/2018 8:51 AM  RN Care Manager: 12/10/2018 8:51 AM  Social Worker: Enid Cutterharlotte Ashly Yepez, Theresia MajorsLCSWA 12/10/2018 8:51 AM  Recreational Therapist:  12/10/2018 8:51 AM  Other:  12/10/2018 8:51 AM  Other:  12/10/2018 8:51 AM  Other: 12/10/2018 8:51 AM    Scribe for Treatment Team: Darreld Mcleanharlotte C Jakyri Brunkhorst, LCSWA 12/10/2018 8:51 AM

## 2018-12-10 NOTE — Progress Notes (Signed)
Recreation Therapy Notes  Date:  4.24.20 Time: 0930 Location: [address] Dayroom  Group Topic: Stress Management  Goal Area(s) Addresses:  Patient will identify positive stress management techniques. Patient will identify benefits of using stress management post d/c.  Behavioral Response:  Engaged  Intervention:  Stress Management  Activity :  Progressive Muscle Relaxation.  LRT introduced the stress management technique of progressive muscle relaxation.  LRT read a script that talked patients through tensing and relaxing each muscle group individually.  Patients were to follow along as script was read to participate in activity.  Education:  Stress Management, Discharge Planning.   Education Outcome: Acknowledges Education  Clinical Observations/Feedback: Pt attended and participated in group.    Caroll Rancher, LRT/CTRS         Caroll Rancher A 12/10/2018 10:35 AM

## 2018-12-10 NOTE — Progress Notes (Signed)
Pt discharged to lobby. Pt was stable and appreciative at that time. All papers and prescriptions were given and valuables returned. Verbal understanding expressed. Denies SI/HI and A/VH. Pt given opportunity to express concerns and ask questions.  

## 2018-12-10 NOTE — Progress Notes (Signed)
[place] NOVEL CORONAVIRUS (COVID-19) DAILY CHECK-OFF SYMPTOMS - answer yes or no to each - every day NO YES  Have you had a fever in the past 24 hours?  . Fever (Temp > 37.80C / 100F) X   Have you had any of these symptoms in the past 24 hours? . New Cough .  Sore Throat  .  Shortness of Breath .  Difficulty Breathing .  Unexplained Body Aches   X   Have you had any one of these symptoms in the past 24 hours not related to allergies?   . Runny Nose .  Nasal Congestion .  Sneezing   X   If you have had runny nose, nasal congestion, sneezing in the past 24 hours, has it worsened?  X   EXPOSURES - check yes or no X   Have you traveled outside the state in the past 14 days?  X   Have you been in contact with someone with a confirmed diagnosis of COVID-19 or PUI in the past 14 days without wearing appropriate PPE?  X   Have you been living in the same home as a person with confirmed diagnosis of COVID-19 or a PUI (household contact)?    X   Have you been diagnosed with COVID-19?    X              What to do next: Answered NO to all: Answered YES to anything:   Proceed with unit schedule Follow the [facility] Inpatient Flowsheet.   

## 2018-12-10 NOTE — Progress Notes (Signed)
  [facility] Adult Case Management Discharge Plan :  Will you be returning to the same living situation after discharge:  Yes,  home At discharge, do you have transportation home?: Yes,  bus Do you have the ability to pay for your medications: No. Referred to [facility].  Release of information consent forms completed and in the chart. Letter on chart.  Patient to Follow up at: Follow-up Information    [facility] Follow up.   Specialty:  Professional Counselor Why:  Please complete intake paperwork online and email it to [email or web address]. Go to For Clients, and Intake Forms. Office will contact you for a medication management appointment. Contact information: [facility] [address] [place] [address] [phone]        [facility] Follow up on 12/16/2018.   Why:  Hospital follow up appointment is Thursday, 4/30 at 9:00a.  The appointment will be held over the phone. The provider will contact you.  Contact information: [address] [place] [address] [phone]           Next level of care provider has access to [facility] Link:no  Safety Planning and Suicide Prevention discussed: Yes,  with patient.  Have you used any form of tobacco in the last 30 days? (Cigarettes, Smokeless Tobacco, Cigars, and/or Pipes): Yes  Has patient been referred to the Quitline?: Patient refused referral  Patient has been referred for addiction treatment: Yes  Darreld Mclean, LCSWA 12/10/2018, 8:50 AM
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
## 2018-12-18 ENCOUNTER — Emergency Department (HOSPITAL_COMMUNITY)
Admission: EM | Admit: 2018-12-18 | Discharge: 2018-12-19 | Disposition: A | Discharge status: Home / Self Care | Payer: Medicaid - Out of State | Attending: Emergency Medicine | Admitting: Emergency Medicine

## 2018-12-18 ENCOUNTER — Other Ambulatory Visit: Payer: Self-pay

## 2018-12-18 ENCOUNTER — Encounter (HOSPITAL_COMMUNITY): Payer: Self-pay

## 2018-12-18 DIAGNOSIS — F1910 Other psychoactive substance abuse, uncomplicated: Secondary | ICD-10-CM | POA: Insufficient documentation

## 2018-12-18 DIAGNOSIS — I10 Essential (primary) hypertension: Secondary | ICD-10-CM | POA: Insufficient documentation

## 2018-12-18 DIAGNOSIS — R443 Hallucinations, unspecified: Secondary | ICD-10-CM

## 2018-12-18 DIAGNOSIS — F17210 Nicotine dependence, cigarettes, uncomplicated: Secondary | ICD-10-CM | POA: Insufficient documentation

## 2018-12-18 DIAGNOSIS — F1994 Other psychoactive substance use, unspecified with psychoactive substance-induced mood disorder: Secondary | ICD-10-CM | POA: Diagnosis present

## 2018-12-18 DIAGNOSIS — R441 Visual hallucinations: Secondary | ICD-10-CM | POA: Insufficient documentation

## 2018-12-18 DIAGNOSIS — R440 Auditory hallucinations: Secondary | ICD-10-CM | POA: Insufficient documentation

## 2018-12-18 NOTE — ED Triage Notes (Signed)
Pt states that two days ago she had meth and last night had smoked 60$ worth of crack and patients states that she started to hallucinate and see shadows and saying she hears voices saying they are going to kill her and how to they are going to kill her.

## 2018-12-19 ENCOUNTER — Other Ambulatory Visit: Payer: Self-pay

## 2018-12-19 DIAGNOSIS — R441 Visual hallucinations: Secondary | ICD-10-CM

## 2018-12-19 DIAGNOSIS — R440 Auditory hallucinations: Secondary | ICD-10-CM

## 2018-12-19 DIAGNOSIS — F14988 Cocaine use, unspecified with other cocaine-induced disorder: Secondary | ICD-10-CM

## 2018-12-19 LAB — I-STAT BETA HCG BLOOD, ED (MC, WL, AP ONLY)
I-stat hCG, quantitative: <5 m[IU]/mL (ref <5)
I-stat hCG, quantitative: <5 m[IU]/mL (ref <5)

## 2018-12-19 LAB — CBC
HCT: 34.4 % — ABNORMAL LOW (ref 36.0–46.0)
Hemoglobin: 10.3 g/dL — ABNORMAL LOW (ref 12.0–15.0)
MCH: 23.7 pg — ABNORMAL LOW (ref 26.0–34.0)
MCHC: 29.9 g/dL — ABNORMAL LOW (ref 30.0–36.0)
MCV: 79.1 fL — ABNORMAL LOW (ref 80.0–100.0)
Platelets: 329 10*3/uL (ref 150–400)
RBC: 4.35 MIL/uL (ref 3.87–5.11)
RDW: 21.2 % — ABNORMAL HIGH (ref 11.5–15.5)
WBC: 7.2 10*3/uL (ref 4.0–10.5)
nRBC: 0 % (ref 0.0–0.2)

## 2018-12-19 LAB — COMPREHENSIVE METABOLIC PANEL
ALT: 24 U/L (ref 0–44)
AST: 26 U/L (ref 15–41)
Albumin: 3.8 g/dL (ref 3.5–5.0)
Alkaline Phosphatase: 47 U/L (ref 38–126)
Anion gap: 9 (ref 5–15)
BUN: 15 mg/dL (ref 6–20)
CO2: 22 mmol/L (ref 22–32)
Calcium: 8.9 mg/dL (ref 8.9–10.3)
Chloride: 106 mmol/L (ref 98–111)
Creatinine, Ser: 0.91 mg/dL (ref 0.44–1.00)
GFR calc Af Amer: >60 mL/min (ref >60)
GFR calc non Af Amer: >60 mL/min (ref >60)
Glucose, Bld: 89 mg/dL (ref 70–99)
Potassium: 3.8 mmol/L (ref 3.5–5.1)
Sodium: 137 mmol/L (ref 135–145)
Total Bilirubin: 0.3 mg/dL (ref 0.3–1.2)
Total Protein: 6.9 g/dL (ref 6.5–8.1)

## 2018-12-19 LAB — RAPID URINE DRUG SCREEN, HOSP PERFORMED
Amphetamines: POSITIVE — AB
Barbiturates: NOT DETECTED
Benzodiazepines: NOT DETECTED
Cocaine: POSITIVE — AB
Opiates: NOT DETECTED
Tetrahydrocannabinol: NOT DETECTED

## 2018-12-19 LAB — ETHANOL: Alcohol, Ethyl (B): <10 mg/dL (ref <10)

## 2018-12-19 MED ORDER — NICOTINE 21 MG/24HR TD PT24: 21.0000 mg | MEDICATED_PATCH | Freq: Once | TRANSDERMAL | Status: DC

## 2018-12-19 NOTE — Progress Notes (Signed)
Received Shirley Murphy from the main ED alert and not happy about being her on the unit. She wanted to go home.She was told she would be reassessed in the morning. She received a snack and afterwards the lights were dimmed in her room. She was verbally responding to verbal stimuli. Initially she refused to talk with TTS  she talke with TTS in depth. She eventually drifted off to sleep at 0200 hrs and slept throughout until 0630. She is calm this am and no noted internal verbal stimuli.

## 2018-12-19 NOTE — ED Provider Notes (Signed)
Pt evaluated this am.  Pt denies any complaints.  She wants to leave.  Pt states she is not suicidal. Vitals stable    Elson Areas, [place] 12/19/18 0831    Derwood Kaplan, MD 12/20/18 [phone]

## 2018-12-19 NOTE — ED Notes (Signed)
Breakfast ordered 

## 2018-12-19 NOTE — ED Notes (Signed)
Pt stuff has been inventoried and pt valubles in security and pt has been wand

## 2018-12-19 NOTE — Consult Note (Signed)
Telepsych Consultation   Reason for Consult:  hallucinations Referring Physician:  EDP Location of Patient:  Location of Provider: Behavioral Health TTS Department  Patient Identification: Shirley Murphy MRN:  [id number] Principal Diagnosis: Substance induced mood disorder (HCC) Diagnosis:  Principal Problem:   Substance induced mood disorder (HCC)   Total Time spent with patient: 30 minutes  Subjective:   Shirley Murphy is a 33 y.o. female patient reports that she is feeling much better today. She states that 2 days ago she was using "ICE" and cocaine together and started having visual and auditory hallucinations. Her boyfriend became concerned and thought she was having a mental break down and requested that she come and get checked out. She denies any suicidal or homicidal ideations and denies any current hallucinations. She states that she will be starting a new job tonight and doesn't want to miss it. She states that she was unaware that methamphetamines and cocaine can cause hallucinations.   HPI:   TTS Assessment 01/08/19: Clinician reviewed note by Roxy Horseman, PA.  Patient presents to the emergency department with a chief complaint of auditory hallucinations. She states that she was using cocaine and meth this week. Reports that she has been hearing her neighbors telling her that they are going to kill her. She states that they tell her that they are going to cut her up into little pieces and put her into jars. She states that she was hearing these things even on her ride to the hospital tonight. She reports having some visual hallucinations, which she describes as seeing shadowy figures. She denies any suicidal thoughts or homicidal thoughts. Patient says her boyfriend brought her to the hospital because of her hearing voices in her apartment.  Patient says that she had been living at an [place] house and got into this apartment.  She said that she complains about two of the  neighbors.  She has been hearing them say they are going to kill her and cut her and put her into jars.  Patient also sees shadow people at times.  Patient says she does not hear voices while she is at hospital. Patient denies any SI or HI.  She admits to using methamphetamine (smoking and snorting) two days ago.  She also has used crack recently.  Patient says she will drink occasionally.   Patient will talk to herself some during the assessment.  She will respond to internal stimuli, saying "what?" when nothing has been said to her.  Patient does not want to come in to inpatient care.  She reports that she was supposed to hear back from [facility] this past week (after having been at [facility]) but missed the call. Patient has fair eye contact.  She says she has not slept in three days and attributes much of this to being tired.   Patient has been at [facility] April 19-24, '20 and in 09/2018 and at [facility] on 06/2018.  She has been to [facility] [place] also. -Clinician discussed patient care with Nira Conn, FNP.  He recommended patient be observed overnight and have an AM psych eval.  Clinician informed Tad Moore.  She noted that patient's BP was very high and observation may be better at [facility].  Clinician informed Roxy Horseman, PA of disposition.  On evaluation today: Patient is seen by me via tele-psych and I have consulted with Dr. Lucianne Muss. The patient denies any suicidal or homicidal ideations and denies any hallucinations. She is a known chronic substance abuser.  She has multiple ED visits and admissions. She presents today with logical conversation and sitting up in her bed and is alert and oriented. She is ready for discharge. She stated that she has follow up resources already and does not need anymore. She was discharged form [facility] on 12/10/18 with these resources. At this time the patient does not meet inpatient criteria and is psychiatrically cleared. I have contacted karen Sofia PA-C and notified her  of the recommendations.   Past Psychiatric History: history of multiple psychiatric admissions , starting as a teenager. Her most recent admission was in April 2020 for depression and substance abuse . She reports history of suicide attempt by hanging self a year ago. She reports history of PTSD as above, states symptoms have improved overtime. She reports history of chronic depression, but states " I feel a lot better when I am on my medication", and states Zoloft has been very helpful for her. Reports she has been diagnosed with Bipolar Disorder in the past, but does not describe any clear history of mania . She does report history of angry/ explosive outbursts of short duration, suggestive of Intermittent Explosive Disorder,   Risk to Self: Suicidal Ideation: No-Not Currently/Within Last 6 Months Suicidal Intent: No Is patient at risk for suicide?: No Suicidal Plan?: No-Not Currently/Within Last 6 Months Specify Current Suicidal Plan: None Access to Means: No Specify Access to Suicidal Means: None What has been your use of drugs/alcohol within the last 12 months?: Meth, crack cocaine How many times?: (Multiple) Other Self Harm Risks: None Triggers for Past Attempts: None known Intentional Self Injurious Behavior: None Comment - Self Injurious Behavior: None current Risk to Others: Homicidal Ideation: No Thoughts of Harm to Others: No Comment - Thoughts of Harm to Others: None Current Homicidal Intent: No Current Homicidal Plan: No Describe Current Homicidal Plan: None reported Access to Homicidal Means: No Describe Access to Homicidal Means: None Identified Victim: No one History of harm to others?: No Assessment of Violence: None Noted Violent Behavior Description: None reported Does patient have access to weapons?: No Criminal Charges Pending?: No Does patient have a court date: No Prior Inpatient Therapy: Prior Inpatient Therapy: Yes Prior Therapy Dates: 11/2018; 02/20207/2019,  05/2018, 09/2018 and 10/2018 Prior Therapy Facilty/Provider(s): [facility] and [facility] Reason for Treatment: depression and SA issues Prior Outpatient Therapy: Prior Outpatient Therapy: Yes Prior Therapy Dates: current Prior [facility] Reason for Treatment: depression Does patient have an ACCT team?: No Does patient have Intensive In-House Services?  : No Does patient have [facility] services? : Unknown Does patient have P4CC services?: No  Past Medical History:  Past Medical History:  Diagnosis Date  . Anemia   . Anxiety   . Chest pain   . Depression   . Gallstones   . Hepatitis C   . Hypertension    patient reports only during detox  . Irregular heart beat     Past Surgical History:  Procedure Laterality Date  . c sections    . TUBAL LIGATION     Family History:  Family History  Problem Relation Age of Onset  . Hypertension Father   . Pancreatic cancer Father    Family Psychiatric  History: strong family history of alcohol dependence- father, sister, brother have alcohol abuse history. One paternal uncle committed suicide . Father had history of depression Social History:  Social History   Substance and Sexual Activity  Alcohol Use Yes  . Alcohol/week: 24.0 standard  drinks  . Types: 24 Cans of beer per week   Comment: 2 40s      Social History   Substance and Sexual Activity  Drug Use Yes  . Types: Heroin, "Crack" cocaine   Comment: crack recently    Social History   Socioeconomic History  . Marital status: Legally Separated    Spouse name: Not on file  . Number of children: Not on file  . Years of education: Not on file  . Highest education level: Not on file  Occupational History  . Not on file  Social Needs  . Financial resource strain: Not on file  . Food insecurity:    Worry: Not on file    Inability: Not on file  . Transportation needs:    Medical: Not on file    Non-medical: Not on file  Tobacco Use  .  Smoking status: Current Every Day Smoker    Packs/day: 1.00    Types: Cigarettes  . Smokeless tobacco: Never Used  Substance and Sexual Activity  . Alcohol use: Yes    Alcohol/week: 24.0 standard drinks    Types: 24 Cans of beer per week    Comment: 2 40s   . Drug use: Yes    Types: Heroin, "Crack" cocaine    Comment: crack recently  . Sexual activity: Yes    Birth control/protection: None  Lifestyle  . Physical activity:    Days per week: Not on file    Minutes per session: Not on file  . Stress: Not on file  Relationships  . Social connections:    Talks on phone: Not on file    Gets together: Not on file    Attends religious service: Not on file    Active member of club or organization: Not on file    Attends meetings of clubs or organizations: Not on file    Relationship status: Not on file  Other Topics Concern  . Not on file  Social History Narrative  . Not on file   Additional Social History:    Allergies:   Allergies  Allergen Reactions  . Benzoyl Peroxide Swelling    Labs:  Results for orders placed or performed during the hospital encounter of 12/18/18 (from the past 48 hour(s))  Comprehensive metabolic panel     Status: None   Collection Time: 12/18/18 11:35 PM  Result Value Ref Range   Sodium 137 135 - 145 mmol/L   Potassium 3.8 3.5 - 5.1 mmol/L   Chloride 106 98 - 111 mmol/L   CO2 22 22 - 32 mmol/L   Glucose, Bld 89 70 - 99 mg/dL   BUN 15 6 - 20 mg/dL   Creatinine, Ser 1.470.91 0.44 - 1.00 mg/dL   Calcium 8.9 8.9 - 82.910.3 mg/dL   Total Protein 6.9 6.5 - 8.1 g/dL   Albumin 3.8 3.5 - 5.0 g/dL   AST 26 15 - 41 U/L   ALT 24 0 - 44 U/L   Alkaline Phosphatase 47 38 - 126 U/L   Total Bilirubin 0.3 0.3 - 1.2 mg/dL   GFR calc non Af Amer >60 >60 mL/min   GFR calc Af Amer >60 >60 mL/min   Anion gap 9 5 - 15    Comment: Performed at [facility], [address] [place], [place] [address]  Ethanol     Status: None   Collection Time: 12/18/18 11:35 PM   Result Value Ref Range   Alcohol, Ethyl (B) <10 <  10 mg/dL    Comment: (NOTE) Lowest detectable limit for serum alcohol is 10 mg/dL. For medical purposes only. Performed at [facility], [address] [place], [place] [address]   cbc     Status: Abnormal   Collection Time: 12/18/18 11:35 PM  Result Value Ref Range   WBC 7.2 4.0 - 10.5 K/uL   RBC 4.35 3.87 - 5.11 MIL/uL   Hemoglobin 10.3 (L) 12.0 - 15.0 g/dL   HCT 09.2 (L) 33.0 - 07.6 %   MCV 79.1 (L) 80.0 - 100.0 fL   MCH 23.7 (L) 26.0 - 34.0 pg   MCHC 29.9 (L) 30.0 - 36.0 g/dL   RDW 22.6 (H) 33.3 - 54.5 %   Platelets 329 150 - 400 K/uL   nRBC 0.0 0.0 - 0.2 %    Comment: Performed at [facility], [address] [place], [place] [address]  I-Stat beta hCG blood, ED     Status: None   Collection Time: 12/18/18 11:49 PM  Result Value Ref Range   I-stat hCG, quantitative <5.0 <5 mIU/mL   Comment 3            Comment:   GEST. AGE      CONC.  (mIU/mL)   <=1 WEEK        5 - 50     2 WEEKS       50 - 500     3 WEEKS       100 - 10,000     4 WEEKS     1,000 - 30,000        FEMALE AND NON-PREGNANT FEMALE:     LESS THAN 5 mIU/mL   Rapid urine drug screen (hospital performed)     Status: Abnormal   Collection Time: 12/19/18 12:41 AM  Result Value Ref Range   Opiates NONE DETECTED NONE DETECTED   Cocaine POSITIVE (A) NONE DETECTED   Benzodiazepines NONE DETECTED NONE DETECTED   Amphetamines POSITIVE (A) NONE DETECTED   Tetrahydrocannabinol NONE DETECTED NONE DETECTED   Barbiturates NONE DETECTED NONE DETECTED    Comment: (NOTE) DRUG SCREEN FOR MEDICAL PURPOSES ONLY.  IF CONFIRMATION IS NEEDED FOR ANY PURPOSE, NOTIFY LAB WITHIN 5 DAYS. LOWEST DETECTABLE LIMITS FOR URINE DRUG SCREEN Drug Class                     Cutoff (ng/mL) Amphetamine and metabolites    1000 Barbiturate and metabolites    200 Benzodiazepine                 200 Tricyclics and metabolites     300 Opiates and metabolites        300 Cocaine  and metabolites        300 THC                            50 Performed at [facility], [address], [place], [place] [address]   I-Stat beta hCG blood, ED     Status: None   Collection Time: 12/19/18  2:18 AM  Result Value Ref Range   I-stat hCG, quantitative <5.0 <5 mIU/mL   Comment 3            Comment:   GEST. AGE      CONC.  (mIU/mL)   <=1 WEEK        5 - 50  2 WEEKS       50 - 500     3 WEEKS       100 - 10,000     4 WEEKS     1,000 - 30,000        FEMALE AND NON-PREGNANT FEMALE:     LESS THAN 5 mIU/mL     Medications:  Current Facility-Administered Medications  Medication Dose Route Frequency Provider Last Rate Last Dose  . nicotine (NICODERM CQ - dosed in mg/24 hours) patch 21 mg  21 mg Transdermal Once Roxy Horseman, PA-C       Current Outpatient Medications  Medication Sig Dispense Refill  . ARIPiprazole (ABILIFY) 5 MG tablet Take 1 tablet (5 mg total) by mouth at bedtime. For mood 30 tablet 0  . gabapentin (NEURONTIN) 300 MG capsule Take 1 capsule (300 mg total) by mouth 3 (three) times daily. 90 capsule 0  . hydrOXYzine (ATARAX/VISTARIL) 25 MG tablet Take 1 tablet (25 mg total) by mouth every 6 (six) hours as needed for anxiety. 30 tablet 0  . Multiple Vitamin (MULTIVITAMIN WITH MINERALS) TABS tablet Take 1 tablet by mouth daily.    . nicotine (NICODERM CQ - DOSED IN MG/24 HOURS) 21 mg/24hr patch Place 1 patch (21 mg total) onto the skin daily. 28 patch 0  . sertraline (ZOLOFT) 100 MG tablet Take 1 tablet (100 mg total) by mouth daily. For mood 30 tablet 0  . traZODone (DESYREL) 150 MG tablet Take 1 tablet (150 mg total) by mouth at bedtime as needed for sleep. 30 tablet 0    Musculoskeletal: Strength & Muscle Tone: within normal limits Gait & Station: normal Patient leans: N/A  Psychiatric Specialty Exam: Physical Exam  Nursing note and vitals reviewed. Constitutional: She is oriented to person, place, and time. She appears well-developed and  well-nourished.  Cardiovascular: Normal rate.  Respiratory: Effort normal.  Musculoskeletal: Normal range of motion.  Neurological: She is alert and oriented to person, place, and time.    Review of Systems  Constitutional: Negative.   HENT: Negative.   Eyes: Negative.   Respiratory: Negative.   Cardiovascular: Negative.   Gastrointestinal: Negative.   Genitourinary: Negative.   Musculoskeletal: Negative.   Skin: Negative.   Neurological: Negative.   Endo/Heme/Allergies: Negative.   Psychiatric/Behavioral: Positive for substance abuse.    Blood pressure 119/74, pulse 65, temperature 97.7 F (36.5 C), temperature source Oral, resp. rate 18, height  (1.549 m), weight 113.4 kg, SpO2 99 %.Body mass index is 47.24 kg/m.  General Appearance: Casual  Eye Contact:  Good  Speech:  Clear and Coherent and Normal Rate  Volume:  Normal  Mood:  Euthymic  Affect:  Congruent  Thought Process:  Coherent and Descriptions of Associations: Intact  Orientation:  Full (Time, Place, and Person)  Thought Content:  WDL  Suicidal Thoughts:  No  Homicidal Thoughts:  No  Memory:  Immediate;   Good Recent;   Good Remote;   Good  Judgement:  Fair  Insight:  Fair  Psychomotor Activity:  Normal  Concentration:  Concentration: Good  Recall:  Good  Fund of Knowledge:  Fair  Language:  Good  Akathisia:  No  Handed:  Right  AIMS (if indicated):     Assets:  Communication Skills Desire for Improvement Housing Physical Health Social Support Transportation  ADL's:  Intact  Cognition:  WNL  Sleep:        Treatment Plan Summary: Foloow up with outpatient resources  Stop using methamphetamines  and cocaine See outpatient detox services  Disposition: No evidence of imminent risk to self or others at present.   Patient does not meet criteria for psychiatric inpatient admission. Supportive therapy provided about ongoing stressors. Discussed crisis plan, support from social network, calling  911, coming to the Emergency Department, and calling Suicide Hotline.  This service was provided via telemedicine using a 2-way, interactive audio and video technology.  Names of all persons participating in this telemedicine service and their role in this encounter. Name: [name] Role: Patient  Name: Reola Calkins NP Role: provider  Name:  Role:   Name:  Role:     Maryfrances Bunnell, FNP 12/19/2018 8:35 AM

## 2018-12-19 NOTE — ED Provider Notes (Signed)
[facility] EMERGENCY DEPARTMENT Provider Note   CSN: [id number] Arrival date & time: 12/18/18  2312    History   Chief Complaint Chief Complaint  Patient presents with  . Hallucinations    HPI Shirley Murphy is a 33 y.o. female.     Patient presents to the emergency department with a chief complaint of auditory hallucinations.  She states that she was using cocaine and meth this week.  Reports that she has been hearing her neighbors telling her that they are going to kill her.  She states that they tell her that they are going to cut her up into little pieces and put her into jars.  She states that she was hearing these things even on her ride to the hospital tonight.  She reports having some visual hallucinations, which she describes as seeing shadowy figures.  She denies any suicidal thoughts or homicidal thoughts.  Denies any other associated symptoms.  The history is provided by the patient. No language interpreter was used.    Past Medical History:  Diagnosis Date  . Anemia   . Anxiety   . Chest pain   . Depression   . Gallstones   . Hepatitis C   . Hypertension    patient reports only during detox  . Irregular heart beat     Patient Active Problem List   Diagnosis Date Noted  . Polysubstance abuse (HCC) 12/10/2018  . 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 Acute hepatitis 08/13/2018  . Nausea & vomiting 08/13/2018  . Overdose 02/28/2018  . Drug overdose 02/28/2018  . Severe recurrent major depression without psychotic features (HCC) 02/24/2018  . Chronic hepatitis C without hepatic coma (HCC) 02/23/2018  . Alcohol dependence with alcohol-induced mood disorder (HCC) 02/02/2018  . MDD (major depressive disorder), recurrent severe, without psychosis (HCC) 02/01/2018  . Moderate cocaine use disorder (HCC) 06/12/2017  . Alcohol abuse with alcohol-induced mood disorder (HCC) 06/12/2017  . Substance  induced mood disorder (HCC) 03/04/2017  . MDD (major depressive disorder), recurrent episode (HCC) 03/02/2017  . Suicide attempt (HCC) 10/15/2016  . HTN (hypertension), benign 10/15/2016  . Irregular heart beat 10/15/2016  . Suicidal ideation 10/15/2016  . Alcoholic intoxication without complication [facility])     Past Surgical History:  Procedure Laterality Date  . c sections    . TUBAL LIGATION       OB History   No obstetric history on file.      Home Medications    Prior to Admission medications   Medication Sig Start Date End Date Taking? Authorizing Provider  ARIPiprazole (ABILIFY) 5 MG tablet Take 1 tablet (5 mg total) by mouth at bedtime. For mood 12/10/18   Aldean Baker, NP  gabapentin (NEURONTIN) 300 MG capsule Take 1 capsule (300 mg total) by mouth 3 (three) times daily. 12/10/18   Aldean Baker, NP  hydrOXYzine (ATARAX/VISTARIL) 25 MG tablet Take 1 tablet (25 mg total) by mouth every 6 (six) hours as needed for anxiety. 12/10/18   Aldean Baker, NP  Multiple Vitamin (MULTIVITAMIN WITH MINERALS) TABS tablet Take 1 tablet by mouth daily. 12/10/18   Aldean Baker, NP  nicotine (NICODERM CQ - DOSED IN MG/24 HOURS) 21 mg/24hr patch Place 1 patch (21 mg total) onto the skin daily. 12/10/18   Aldean Baker, NP  sertraline (ZOLOFT) 100 MG tablet Take 1 tablet (100 mg total) by mouth daily. For mood 12/10/18   Marciano Sequin  E, NP  traZODone (DESYREL) 150 MG tablet Take 1 tablet (150 mg total) by mouth at bedtime as needed for sleep. 12/10/18   Aldean Baker, NP    Family History Family History  Problem Relation Age of Onset  . Hypertension Father   . Pancreatic cancer Father     Social History Social History   Tobacco Use  . Smoking status: Current Every Day Smoker    Packs/day: 1.00    Types: Cigarettes  . Smokeless tobacco: Never Used  Substance Use Topics  . Alcohol use: Yes    Alcohol/week: 24.0 standard drinks    Types: 24 Cans of beer per week    Comment: 2 40s    . Drug use: Yes    Types: Heroin, "Crack" cocaine    Comment: crack recently     Allergies   Benzoyl peroxide   Review of Systems Review of Systems  All other systems reviewed and are negative.    Physical Exam Updated Vital Signs BP (!) 141/101 (BP Location: Right Arm)   Pulse (!) 102   Temp 97.9 F (36.6 C) (Oral)   Resp 20   Ht  (1.549 m)   Wt 113.4 kg   SpO2 98%   BMI 47.24 kg/m   Physical Exam Vitals signs and nursing note reviewed.  Constitutional:      General: She is not in acute distress.    Appearance: She is well-developed.  HENT:     Head: Normocephalic and atraumatic.  Eyes:     Conjunctiva/sclera: Conjunctivae normal.  Neck:     Musculoskeletal: Neck supple.  Cardiovascular:     Rate and Rhythm: Normal rate and regular rhythm.     Heart sounds: No murmur.  Pulmonary:     Effort: Pulmonary effort is normal. No respiratory distress.     Breath sounds: Normal breath sounds.  Abdominal:     Palpations: Abdomen is soft.     Tenderness: There is no abdominal tenderness.  Skin:    General: Skin is warm and dry.  Neurological:     Mental Status: She is alert and oriented to person, place, and time.  Psychiatric:     Comments: Hypervigilant      ED Treatments / Results  Labs (all labs ordered are listed, but only abnormal results are displayed) Labs Reviewed  CBC - Abnormal; Notable for the following components:      Result Value   Hemoglobin 10.3 (*)    HCT 34.4 (*)    MCV 79.1 (*)    MCH 23.7 (*)    MCHC 29.9 (*)    RDW 21.2 (*)    All other components within normal limits  COMPREHENSIVE METABOLIC PANEL  ETHANOL  RAPID URINE DRUG SCREEN, HOSP PERFORMED  I-STAT BETA HCG BLOOD, ED ([facility], [facility], [facility] ONLY)    EKG EKG Interpretation  Date/Time:  Saturday Dec 18 2018 23:44:39 EDT Ventricular Rate:  82 PR Interval:  122 QRS Duration: 104 QT Interval:  404 QTC Calculation: 472 R Axis:   15 Text Interpretation:  Normal sinus  rhythm Normal ECG When compared with ECG of 11/13/2018, No significant change was found Confirmed by Dione Booze ([id number]) on 12/19/2018 12:21:24 AM   Radiology No results found.  Procedures Procedures (including critical care time)  Medications Ordered in ED Medications  nicotine (NICODERM CQ - dosed in mg/24 hours) patch 21 mg (has no administration in time range)     Initial Impression / Assessment and Plan /  ED Course  I have reviewed the triage vital signs and the nursing notes.  Pertinent labs & imaging results that were available during my care of the patient were reviewed by me and considered in my medical decision making (see chart for details).        Patient with auditory and visual hallucinations.  No SI/HI.  Hx of polysubstance abuse.  No other complaints.  TTS recommends observation overnight and reassessment in the morning.  Final Clinical Impressions(s) / ED Diagnoses   Final diagnoses:  Hallucinations    ED Discharge Orders    None       Roxy HorsemanBrowning, Kayleann Mccaffery, PA-C 12/19/18 0306    Dione BoozeGlick, David, MD 12/19/18 [phone]
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
## 2018-12-19 NOTE — ED Notes (Addendum)
ALL belongings - 1 labeled belongings bag, purse, and 1 valuables envelope - returned to pt - Pt signed verifying all items present. Pt wearing her eyeglasses.

## 2019-02-06 ENCOUNTER — Encounter (HOSPITAL_COMMUNITY): Payer: Self-pay | Admitting: Emergency Medicine

## 2019-02-06 ENCOUNTER — Emergency Department (HOSPITAL_COMMUNITY)
Admission: EM | Admit: 2019-02-06 | Discharge: 2019-02-07 | Disposition: A | Discharge status: Other Acute Inpatient Hospital | Payer: Self-pay | Attending: Psychiatry | Admitting: Psychiatry

## 2019-02-06 ENCOUNTER — Other Ambulatory Visit: Payer: Self-pay

## 2019-02-06 DIAGNOSIS — T50902A Poisoning by unspecified drugs, medicaments and biological substances, intentional self-harm, initial encounter: Secondary | ICD-10-CM

## 2019-02-06 DIAGNOSIS — Y999 Unspecified external cause status: Secondary | ICD-10-CM | POA: Insufficient documentation

## 2019-02-06 DIAGNOSIS — I10 Essential (primary) hypertension: Secondary | ICD-10-CM | POA: Insufficient documentation

## 2019-02-06 DIAGNOSIS — R45851 Suicidal ideations: Secondary | ICD-10-CM

## 2019-02-06 DIAGNOSIS — Y939 Activity, unspecified: Secondary | ICD-10-CM | POA: Insufficient documentation

## 2019-02-06 DIAGNOSIS — F14920 Cocaine use, unspecified with intoxication, uncomplicated: Secondary | ICD-10-CM | POA: Insufficient documentation

## 2019-02-06 DIAGNOSIS — F329 Major depressive disorder, single episode, unspecified: Secondary | ICD-10-CM | POA: Insufficient documentation

## 2019-02-06 DIAGNOSIS — Z20828 Contact with and (suspected) exposure to other viral communicable diseases: Secondary | ICD-10-CM | POA: Insufficient documentation

## 2019-02-06 DIAGNOSIS — T426X2A Poisoning by other antiepileptic and sedative-hypnotic drugs, intentional self-harm, initial encounter: Secondary | ICD-10-CM | POA: Insufficient documentation

## 2019-02-06 DIAGNOSIS — Y929 Unspecified place or not applicable: Secondary | ICD-10-CM | POA: Insufficient documentation

## 2019-02-06 DIAGNOSIS — F1010 Alcohol abuse, uncomplicated: Secondary | ICD-10-CM | POA: Insufficient documentation

## 2019-02-06 DIAGNOSIS — Z79899 Other long term (current) drug therapy: Secondary | ICD-10-CM | POA: Insufficient documentation

## 2019-02-06 DIAGNOSIS — X838XXA Intentional self-harm by other specified means, initial encounter: Secondary | ICD-10-CM | POA: Insufficient documentation

## 2019-02-06 DIAGNOSIS — F17210 Nicotine dependence, cigarettes, uncomplicated: Secondary | ICD-10-CM | POA: Insufficient documentation

## 2019-02-06 LAB — COMPREHENSIVE METABOLIC PANEL
ALT: 17 U/L (ref 0–44)
AST: 24 U/L (ref 15–41)
Albumin: 3.9 g/dL (ref 3.5–5.0)
Alkaline Phosphatase: 45 U/L (ref 38–126)
Anion gap: 7 (ref 5–15)
BUN: 19 mg/dL (ref 6–20)
CO2: 23 mmol/L (ref 22–32)
Calcium: 9.1 mg/dL (ref 8.9–10.3)
Chloride: 107 mmol/L (ref 98–111)
Creatinine, Ser: 1.22 mg/dL — ABNORMAL HIGH (ref 0.44–1.00)
GFR calc Af Amer: >60 mL/min (ref >60)
GFR calc non Af Amer: 59 mL/min — ABNORMAL LOW (ref >60)
Glucose, Bld: 72 mg/dL (ref 70–99)
Potassium: 4.3 mmol/L (ref 3.5–5.1)
Sodium: 137 mmol/L (ref 135–145)
Total Bilirubin: 0.5 mg/dL (ref 0.3–1.2)
Total Protein: 7.1 g/dL (ref 6.5–8.1)

## 2019-02-06 LAB — ACETAMINOPHEN LEVEL: Acetaminophen (Tylenol), Serum: <10 ug/mL — ABNORMAL LOW (ref 10–30)

## 2019-02-06 LAB — SARS CORONAVIRUS 2 BY RT PCR (HOSPITAL ORDER, PERFORMED IN ~~LOC~~ HOSPITAL LAB): SARS Coronavirus 2: NEGATIVE

## 2019-02-06 LAB — CBC
HCT: 40.4 % (ref 36.0–46.0)
Hemoglobin: 13 g/dL (ref 12.0–15.0)
MCH: 26.7 pg (ref 26.0–34.0)
MCHC: 32.2 g/dL (ref 30.0–36.0)
MCV: 83 fL (ref 80.0–100.0)
Platelets: 270 10*3/uL (ref 150–400)
RBC: 4.87 MIL/uL (ref 3.87–5.11)
RDW: 18.2 % — ABNORMAL HIGH (ref 11.5–15.5)
WBC: 8.6 10*3/uL (ref 4.0–10.5)
nRBC: 0 % (ref 0.0–0.2)

## 2019-02-06 LAB — RAPID URINE DRUG SCREEN, HOSP PERFORMED
Amphetamines: NOT DETECTED
Barbiturates: NOT DETECTED
Benzodiazepines: NOT DETECTED
Cocaine: POSITIVE — AB
Opiates: NOT DETECTED
Tetrahydrocannabinol: NOT DETECTED

## 2019-02-06 LAB — ETHANOL: Alcohol, Ethyl (B): <10 mg/dL (ref <10)

## 2019-02-06 LAB — SALICYLATE LEVEL: Salicylate Lvl: <7 mg/dL (ref 2.8–30.0)

## 2019-02-06 LAB — I-STAT BETA HCG BLOOD, ED (MC, WL, AP ONLY): I-stat hCG, quantitative: <5 m[IU]/mL (ref <5)

## 2019-02-06 MED ORDER — MELATONIN 5 MG PO TABS: 15.0000 mg | ORAL_TABLET | Freq: Every evening | ORAL | Status: DC | PRN

## 2019-02-06 MED ORDER — LORAZEPAM 2 MG/ML IJ SOLN: 0.0000 mg | Freq: Two times a day (BID) | INTRAMUSCULAR | Status: DC

## 2019-02-06 MED ORDER — THIAMINE HCL 100 MG/ML IJ SOLN: 100.0000 mg | Freq: Every day | INTRAMUSCULAR | Status: DC

## 2019-02-06 MED ORDER — VITAMIN B-1 100 MG PO TABS
100.0000 mg | ORAL_TABLET | Freq: Every day | ORAL | Status: DC
  Administered 2019-02-06 – 2019-02-07 (×2): 100 mg via ORAL
  Filled 2019-02-06 (×2): qty 1

## 2019-02-06 MED ORDER — LORAZEPAM 1 MG PO TABS: 0.0000 mg | ORAL_TABLET | Freq: Two times a day (BID) | ORAL | Status: DC

## 2019-02-06 MED ORDER — IBUPROFEN 800 MG PO TABS
800.0000 mg | ORAL_TABLET | Freq: Three times a day (TID) | ORAL | Status: DC | PRN
  Administered 2019-02-07: 800 mg via ORAL
  Filled 2019-02-06: qty 1

## 2019-02-06 MED ORDER — LOPERAMIDE HCL 2 MG PO CAPS
4.0000 mg | ORAL_CAPSULE | Freq: Once | ORAL | Status: AC
  Administered 2019-02-06: 4 mg via ORAL
  Filled 2019-02-06: qty 2

## 2019-02-06 MED ORDER — TRAZODONE HCL 50 MG PO TABS
100.0000 mg | ORAL_TABLET | Freq: Every day | ORAL | Status: DC
  Administered 2019-02-06: 100 mg via ORAL
  Filled 2019-02-06: qty 1

## 2019-02-06 MED ORDER — SERTRALINE HCL 100 MG PO TABS
100.0000 mg | ORAL_TABLET | Freq: Every day | ORAL | Status: DC
  Administered 2019-02-07: 100 mg via ORAL
  Filled 2019-02-06 (×2): qty 1

## 2019-02-06 MED ORDER — FERROUS SULFATE 325 (65 FE) MG PO TABS
325.0000 mg | ORAL_TABLET | Freq: Every day | ORAL | Status: DC
  Administered 2019-02-07: 325 mg via ORAL
  Filled 2019-02-06: qty 1

## 2019-02-06 MED ORDER — LORAZEPAM 1 MG PO TABS
0.0000 mg | ORAL_TABLET | Freq: Four times a day (QID) | ORAL | Status: DC
  Administered 2019-02-06 (×2): 1 mg via ORAL
  Administered 2019-02-07: 2 mg via ORAL
  Administered 2019-02-07: 1 mg via ORAL
  Filled 2019-02-06 (×3): qty 1
  Filled 2019-02-06: qty 2

## 2019-02-06 MED ORDER — LORAZEPAM 2 MG/ML IJ SOLN: 0.0000 mg | Freq: Four times a day (QID) | INTRAMUSCULAR | Status: DC

## 2019-02-06 MED ORDER — ONDANSETRON HCL 4 MG PO TABS
4.0000 mg | ORAL_TABLET | Freq: Three times a day (TID) | ORAL | Status: DC | PRN
  Administered 2019-02-06: 4 mg via ORAL
  Filled 2019-02-06: qty 1

## 2019-02-06 MED ORDER — NICOTINE 21 MG/24HR TD PT24
21.0000 mg | MEDICATED_PATCH | Freq: Every day | TRANSDERMAL | Status: DC
  Administered 2019-02-06 – 2019-02-07 (×2): 21 mg via TRANSDERMAL
  Filled 2019-02-06 (×3): qty 1

## 2019-02-06 NOTE — ED Notes (Signed)
[facility] sitter arrived.

## 2019-02-06 NOTE — ED Notes (Signed)
Spoke with poison control regarding pt labs and condition.
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
## 2019-02-06 NOTE — ED Notes (Signed)
Belongings inventoried and placed in locker 2. 

## 2019-02-06 NOTE — ED Provider Notes (Signed)
[facility] Provider Note   CSN: [id number] Arrival date & time: 02/06/19  1452    History   Chief Complaint Chief Complaint  Patient presents with  . Suicidal  . Alcohol Problem    HPI Shirley Murphy is a 33 y.o. female with history of depression, anxiety, previous suicide attempts who presents with suicidal ideation following intentional drug overdose last evening.  Patient reports she has been feeling suicidal for about a week now.  She reports taking ~80 gabapentin pills yesterday from 7:30 AM to about 9:30 PM and attempt to kill herself.  She states she feels hopeless.  She denies any HI or AVH.  She has been trying to cut back on alcohol and quit for entire 2 weeks, however when she began feeling suicidal again, began drinking.  She reports drinking a 12 pack of beer and 1/5 of vodka yesterday.  She last drank at 2 AM today.  Patient denies any fever, chest pain, shortness of breath, abdominal pain, nausea, vomiting.  She has had diarrhea, which she states is related to her detox.  She has history of getting seizures from alcohol withdrawal.  She has not had any this time.  Patient also used intranasal cocaine yesterday. She also uses crack cocaine. She has had 7 or 8 psychiatric admissions in the past.  She has had multiple suicide attempts.     HPI  Past Medical History:  Diagnosis Date  . Anemia   . Anxiety   . Chest pain   . Depression   . Gallstones   . Hepatitis C   . Hypertension    patient reports only during detox  . Irregular heart beat     Patient Active Problem List   Diagnosis Date Noted  . Polysubstance abuse (HCC) 12/10/2018  . 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 Acute hepatitis 08/13/2018  . Nausea & vomiting 08/13/2018  . Overdose 02/28/2018  . Drug overdose 02/28/2018  . Severe recurrent major depression without psychotic features (HCC) 02/24/2018  . Chronic  hepatitis C without hepatic coma (HCC) 02/23/2018  . Alcohol dependence with alcohol-induced mood disorder (HCC) 02/02/2018  . MDD (major depressive disorder), recurrent severe, without psychosis (HCC) 02/01/2018  . Moderate cocaine use disorder (HCC) 06/12/2017  . Alcohol abuse with alcohol-induced mood disorder (HCC) 06/12/2017  . Substance induced mood disorder (HCC) 03/04/2017  . MDD (major depressive disorder), recurrent episode (HCC) 03/02/2017  . Suicide attempt (HCC) 10/15/2016  . HTN (hypertension), benign 10/15/2016  . Irregular heart beat 10/15/2016  . Suicidal ideation 10/15/2016  . Alcoholic intoxication without complication [facility](HCC)     Past Surgical History:  Procedure Laterality Date  . c sections    . TUBAL LIGATION       OB History   No obstetric history on file.      Home Medications    Prior to Admission medications   Medication Sig Start Date End Date Taking? Authorizing Provider  ARIPiprazole (ABILIFY) 5 MG tablet Take 1 tablet (5 mg total) by mouth at bedtime. For mood 12/10/18   Aldean BakerSykes, Janet E, NP  gabapentin (NEURONTIN) 300 MG capsule Take 1 capsule (300 mg total) by mouth 3 (three) times daily. 12/10/18   Aldean BakerSykes, Janet E, NP  hydrOXYzine (ATARAX/VISTARIL) 25 MG tablet Take 1 tablet (25 mg total) by mouth every 6 (six) hours as needed for anxiety. 12/10/18   Aldean BakerSykes, Janet E, NP  Multiple Vitamin (MULTIVITAMIN WITH MINERALS) TABS  tablet Take 1 tablet by mouth daily. 12/10/18   Aldean BakerSykes, Janet E, NP  nicotine (NICODERM CQ - DOSED IN MG/24 HOURS) 21 mg/24hr patch Place 1 patch (21 mg total) onto the skin daily. 12/10/18   Aldean BakerSykes, Janet E, NP  sertraline (ZOLOFT) 100 MG tablet Take 1 tablet (100 mg total) by mouth daily. For mood 12/10/18   Aldean BakerSykes, Janet E, NP  traZODone (DESYREL) 150 MG tablet Take 1 tablet (150 mg total) by mouth at bedtime as needed for sleep. 12/10/18   Aldean BakerSykes, Janet E, NP    Family History Family History  Problem Relation Age of Onset  . Hypertension  Father   . Pancreatic cancer Father     Social History Social History   Tobacco Use  . Smoking status: Current Every Day Smoker    Packs/day: 1.00    Types: Cigarettes  . Smokeless tobacco: Never Used  Substance Use Topics  . Alcohol use: Yes    Alcohol/week: 12.0 standard drinks    Types: 12 Cans of beer per week    Comment: 1/5 vodka and 12 beers  . Drug use: Yes    Types: "Crack" cocaine    Comment: UDS not available at time of assessment     Allergies   Benzoyl peroxide   Review of Systems Review of Systems  Constitutional: Negative for chills and fever.  HENT: Negative for facial swelling and sore throat.   Respiratory: Negative for shortness of breath.   Cardiovascular: Negative for chest pain.  Gastrointestinal: Negative for abdominal pain, nausea and vomiting.  Genitourinary: Negative for dysuria.  Musculoskeletal: Negative for back pain.  Skin: Negative for rash and wound.  Neurological: Negative for headaches.  Psychiatric/Behavioral: Positive for suicidal ideas. Negative for hallucinations. The patient is nervous/anxious.      Physical Exam Updated Vital Signs BP (!) 150/66   Pulse (!) 104   Ht 5\' 1"  (1.549 m)   Wt 113.4 kg   LMP 01/09/2019   SpO2 96%   BMI 47.24 kg/m   Physical Exam Vitals signs and nursing note reviewed.  Constitutional:      General: She is not in acute distress.    Appearance: She is well-developed. She is not diaphoretic.     Comments: Tearful  HENT:     Head: Normocephalic and atraumatic.     Mouth/Throat:     Pharynx: No oropharyngeal exudate.  Eyes:     General: No scleral icterus.       Right eye: No discharge.        Left eye: No discharge.     Conjunctiva/sclera: Conjunctivae normal.     Pupils: Pupils are equal, round, and reactive to light.  Neck:     Musculoskeletal: Normal range of motion and neck supple.     Thyroid: No thyromegaly.  Cardiovascular:     Rate and Rhythm: Normal rate and regular rhythm.      Heart sounds: Normal heart sounds. No murmur. No friction rub. No gallop.   Pulmonary:     Effort: Pulmonary effort is normal. No respiratory distress.     Breath sounds: Normal breath sounds. No stridor. No wheezing or rales.  Abdominal:     General: Bowel sounds are normal. There is no distension.     Palpations: Abdomen is soft.     Tenderness: There is no abdominal tenderness. There is no guarding or rebound.  Lymphadenopathy:     Cervical: No cervical adenopathy.  Skin:    General: Skin  is warm and dry.     Coloration: Skin is not pale.     Findings: No rash.  Neurological:     Mental Status: She is alert.     Coordination: Coordination normal.  Psychiatric:        Attention and Perception: She does not perceive auditory or visual hallucinations.        Speech: Speech normal.        Behavior: Behavior is cooperative.        Thought Content: Thought content includes suicidal ideation. Thought content does not include homicidal ideation. Thought content includes suicidal plan. Thought content does not include homicidal plan.      ED Treatments / Results  Labs (all labs ordered are listed, but only abnormal results are displayed) Labs Reviewed  COMPREHENSIVE METABOLIC PANEL - Abnormal; Notable for the following components:      Result Value   Creatinine, Ser 1.22 (*)    GFR calc non Af Amer 59 (*)    All other components within normal limits  CBC - Abnormal; Notable for the following components:   RDW 18.2 (*)    All other components within normal limits  RAPID URINE DRUG SCREEN, HOSP PERFORMED - Abnormal; Notable for the following components:   Cocaine POSITIVE (*)    All other components within normal limits  ACETAMINOPHEN LEVEL - Abnormal; Notable for the following components:   Acetaminophen (Tylenol), Serum <10 (*)    All other components within normal limits  ETHANOL  SALICYLATE LEVEL  I-STAT BETA HCG BLOOD, ED ([facility], [facility], [facility] ONLY)    EKG EKG Interpretation   Date/Time:  Sunday February 06 2019 15:33:44 EDT Ventricular Rate:  89 PR Interval:    QRS Duration: 104 QT Interval:  371 QTC Calculation: 452 R Axis:   -2 Text Interpretation:  Sinus rhythm RSR' in V1 or V2, right VCD or RVH Confirmed by Tilden Fossaees, Elizabeth [id number]) on 02/06/2019 3:39:27 PM   Radiology No results found.  Procedures Procedures (including critical care time)  Medications Ordered in ED Medications  nicotine (NICODERM CQ - dosed in mg/24 hours) patch 21 mg (21 mg Transdermal Patch Applied 02/06/19 1650)  LORazepam (ATIVAN) injection 0-4 mg ( Intravenous See Alternative 02/06/19 1649)    Or  LORazepam (ATIVAN) tablet 0-4 mg (1 mg Oral Given 02/06/19 1649)  LORazepam (ATIVAN) injection 0-4 mg (has no administration in time range)    Or  LORazepam (ATIVAN) tablet 0-4 mg (has no administration in time range)  thiamine (VITAMIN B-1) tablet 100 mg (100 mg Oral Given 02/06/19 1650)    Or  thiamine (B-1) injection 100 mg ( Intravenous See Alternative 02/06/19 1650)  ondansetron (ZOFRAN) tablet 4 mg (has no administration in time range)  loperamide (IMODIUM) capsule 4 mg (4 mg Oral Given 02/06/19 1721)     Initial Impression / Assessment and Plan / ED Course  I have reviewed the triage vital signs and the nursing notes.  Pertinent labs & imaging results that were available during my care of the patient were reviewed by me and considered in my medical decision making (see chart for details).  Clinical Course as of Feb 05 1942  Sun Feb 06, 2019  1544 I discussed patient case with poison control RN, Angelique BlonderDenise, who advised Tylenol level, liver function, baseline EKG, and 6-hour Obs.  Advised gabapentin can cause CNS depression and diarrhea.   [AL]    Clinical Course User Index [AL] Emi HolesLaw, Brilee [place], PA-C  Patient presenting with suicidal ideation as well as alcohol withdrawal symptoms.  Poison control was called advise a 6-hour observation considering gabapentin overdose  yesterday.  Patient is feeling fine physically, acetaminophen level was negative as well as LFTs and EKG.  TTS evaluated the patient who advised AM psych eval.  Patient is agreeable with this plan, however she tried to leave she should be IVC'd.  Final Clinical Impressions(s) / ED Diagnoses   Final diagnoses:  Suicidal ideation  Intentional drug overdose, initial encounter [facility])    ED Discharge Orders    None       Caryl Ada 02/06/19 1943    Quintella Reichert, MD 02/09/19 [phone]

## 2019-02-06 NOTE — ED Triage Notes (Signed)
Pt. Stated, Shirley Murphy been suicidal for about a week and Im also detoxing from alcohol.

## 2019-02-06 NOTE — ED Notes (Signed)
Dinner Tray ordered by EMT

## 2019-02-07 ENCOUNTER — Other Ambulatory Visit: Payer: Self-pay

## 2019-02-07 ENCOUNTER — Encounter (HOSPITAL_COMMUNITY): Payer: Self-pay

## 2019-02-07 ENCOUNTER — Observation Stay (HOSPITAL_COMMUNITY)
Admission: AD | Admit: 2019-02-07 | Discharge: 2019-02-08 | Disposition: A | Discharge status: Home / Self Care | Payer: Self-pay | Source: Intra-hospital | Attending: Psychiatry | Admitting: Psychiatry

## 2019-02-07 DIAGNOSIS — F17210 Nicotine dependence, cigarettes, uncomplicated: Secondary | ICD-10-CM | POA: Insufficient documentation

## 2019-02-07 DIAGNOSIS — Z7289 Other problems related to lifestyle: Secondary | ICD-10-CM | POA: Insufficient documentation

## 2019-02-07 DIAGNOSIS — F1420 Cocaine dependence, uncomplicated: Secondary | ICD-10-CM | POA: Diagnosis present

## 2019-02-07 DIAGNOSIS — I10 Essential (primary) hypertension: Secondary | ICD-10-CM | POA: Insufficient documentation

## 2019-02-07 DIAGNOSIS — Z79899 Other long term (current) drug therapy: Secondary | ICD-10-CM | POA: Insufficient documentation

## 2019-02-07 DIAGNOSIS — F419 Anxiety disorder, unspecified: Secondary | ICD-10-CM | POA: Insufficient documentation

## 2019-02-07 DIAGNOSIS — F329 Major depressive disorder, single episode, unspecified: Secondary | ICD-10-CM | POA: Diagnosis present

## 2019-02-07 DIAGNOSIS — F1424 Cocaine dependence with cocaine-induced mood disorder: Principal | ICD-10-CM | POA: Insufficient documentation

## 2019-02-07 DIAGNOSIS — F1994 Other psychoactive substance use, unspecified with psychoactive substance-induced mood disorder: Secondary | ICD-10-CM | POA: Diagnosis present

## 2019-02-07 DIAGNOSIS — Z791 Long term (current) use of non-steroidal anti-inflammatories (NSAID): Secondary | ICD-10-CM | POA: Insufficient documentation

## 2019-02-07 DIAGNOSIS — B1920 Unspecified viral hepatitis C without hepatic coma: Secondary | ICD-10-CM | POA: Insufficient documentation

## 2019-02-07 DIAGNOSIS — Z8249 Family history of ischemic heart disease and other diseases of the circulatory system: Secondary | ICD-10-CM | POA: Insufficient documentation

## 2019-02-07 DIAGNOSIS — F339 Major depressive disorder, recurrent, unspecified: Secondary | ICD-10-CM | POA: Diagnosis present

## 2019-02-07 MED ORDER — ARIPIPRAZOLE 5 MG PO TABS
5.0000 mg | ORAL_TABLET | Freq: Every day | ORAL | Status: DC
  Administered 2019-02-07 – 2019-02-08 (×2): 5 mg via ORAL
  Filled 2019-02-07 (×2): qty 1

## 2019-02-07 MED ORDER — TRAZODONE HCL 50 MG PO TABS: 50.0000 mg | ORAL_TABLET | Freq: Every evening | ORAL | Status: DC | PRN

## 2019-02-07 MED ORDER — ALUM & MAG HYDROXIDE-SIMETH 200-200-20 MG/5ML PO SUSP: 30.0000 mL | ORAL | Status: DC | PRN

## 2019-02-07 MED ORDER — HYDROXYZINE HCL 25 MG PO TABS
25.0000 mg | ORAL_TABLET | Freq: Three times a day (TID) | ORAL | Status: DC | PRN
  Administered 2019-02-07 – 2019-02-08 (×2): 25 mg via ORAL
  Filled 2019-02-07 (×2): qty 1

## 2019-02-07 MED ORDER — TRAZODONE HCL 100 MG PO TABS
100.0000 mg | ORAL_TABLET | Freq: Every evening | ORAL | Status: DC | PRN
  Administered 2019-02-07: 100 mg via ORAL
  Filled 2019-02-07: qty 1

## 2019-02-07 MED ORDER — ACETAMINOPHEN 325 MG PO TABS
650.0000 mg | ORAL_TABLET | Freq: Four times a day (QID) | ORAL | Status: DC | PRN
  Administered 2019-02-08: 650 mg via ORAL
  Filled 2019-02-07: qty 2

## 2019-02-07 MED ORDER — MAGNESIUM HYDROXIDE 400 MG/5ML PO SUSP: 30.0000 mL | Freq: Every day | ORAL | Status: DC | PRN

## 2019-02-07 MED ORDER — SERTRALINE HCL 100 MG PO TABS
100.0000 mg | ORAL_TABLET | Freq: Every day | ORAL | Status: DC
  Administered 2019-02-08: 08:00:00 100 mg via ORAL
  Filled 2019-02-07: qty 1

## 2019-02-07 MED ORDER — GABAPENTIN 300 MG PO CAPS
300.0000 mg | ORAL_CAPSULE | Freq: Two times a day (BID) | ORAL | Status: DC
  Administered 2019-02-07 – 2019-02-08 (×2): 300 mg via ORAL
  Filled 2019-02-07 (×2): qty 1

## 2019-02-07 NOTE — Consult Note (Signed)
Telepsych Consultation   Reason for Consult:  Reported overdose attempt Referring Physician:  EPD Location of Patient: 67[facility] Location of Provider: [facility]  Patient Identification: Shirley Murphy MRN:  [id number] Principal Diagnosis: <principal problem not specified> Diagnosis:  Active Problems:   * No active hospital problems. *   Total Time spent with patient: 15 minutes  Subjective:   Shirley Murphy is a 33 y.o. female was seen via tele assessment.  Patient reported history of depression and suicidal ideations, depression and substance abuse.  Patient reports she feels as if she needs to come inpatient in order to detox to get into [facility].  States her suicidal ideations are worsening due to substance use stated she last used few nights ago. "  I think any stronger medications for my depression."  Reports she is followed by the [facility] however has not made any follow-up appointments.  Patient was offer multiple outpatient resources i.e. [facility], [facility] and [facility].  Patient reports " I have called all those places and they have no beds available."  Patient was offered to follow-up with [facility] however patient states is only a 2-day program and she states."  I will not go back there."  Social work to follow-up with additional outpatient resources.  Case staffed with MD Lucianne Muss.  Continue to recommend outpatient follow-up.  -NP spoke to patient via phone as she reports she feels as if she is detoxed however continues to express concerns recommend inpatient.  Reiterated outpatient follow-up.  Patient has requested to follow-up with a "different social worker."  As she was requesting transportation to [facility]?  Support, encouragement and reassurance was provided.  HPI:  Per assessment note: Shirley Murphy is a 33 y.o. female who presented to [facility] on voluntary basis with complaint of suicide attempt, continued suicidal ideation, and substance use concerns.  She lives alone in  [place], and she is employed part-time at [employer].  Pt is followed by [facility].  This is Pt's fourth presentation to the ED in 2020.  She was last assessed by [facility] in May 2020.  At that time, she presented with complaint of auditory hallucination, as well as use of cocaine and meth.  Pt reported that she has been depressed for about a week, and that on 02/05/2019, she intentionally overdosed on 50 to 80 tabs of Gabapentin.  Pt reported also that she ingested 1/5 of vodka and a 12 pack of beer, as well as an unknown quantity of crack cocaine.  (BAC and UDS were not available at time of assessment).  Pt endorsed persistent and unremitting despondency, feelings of worthlessness, irritability, continued use of alcohol (for which she is seeking rehab) and cocaine, and self-injury (pulling her hair).  Pt denied homicidal ideation.  She also denied hallucination, although she has a history of auditory and visual hallucination.  Pt stated that she has been treated several times due to numerous suicide attempts -- she has been treated at [facility], [facility], [facility].  She has also received substance use treatment at [facility] and [facility].  Pt stated that she was concerned she was going into withdrawal from alcohol use.  During assessment, Pt presented as alert and oriented.  She had good eye contact.  She was dressed in a hospital gown with street clothes underneath.  Pt's mood and affect were depressed.  Pt's speech was normal in rate, rhythm, and volume.  Thought processes were within normal range, and thought content was logical and goal-oriented.  There  was no evidence of delusion.  Pt's memory and concentration    Past Psychiatric History:   Risk to Self: Suicidal Ideation: Yes-Currently Present Suicidal Intent: Yes-Currently Present Is patient at risk for suicide?: Yes Suicidal Plan?: Yes-Currently Present Specify Current Suicidal Plan: Pt stated she overdosed on 50-80 tabs gabapentin Access to  Means: Yes Specify Access to Suicidal Means: Prescribed medication What has been your use of drugs/alcohol within the last 12 months?: Alcohol, crack cocaine, meth(BAC and UDS not available at time of assessment) How many times?: (Multiple) Other Self Harm Risks: substance use Triggers for Past Attempts: Unpredictable Intentional Self Injurious Behavior: Damaging Comment - Self Injurious Behavior: Pt pulls her hair Risk to Others: Homicidal Ideation: No Thoughts of Harm to Others: No Current Homicidal Intent: No Current Homicidal Plan: No Access to Homicidal Means: No History of harm to others?: No Assessment of Violence: None Noted Does patient have access to weapons?: No Criminal Charges Pending?: No Does patient have a court date: No Prior Inpatient Therapy: Prior Inpatient Therapy: Yes Prior Therapy Dates: 2020, 2019 Prior Therapy Facilty/Provider(s): [facility], [facility], [facility], [facility] Reason for Treatment: depression and SA issues Prior Outpatient Therapy: Prior Outpatient Therapy: Yes Prior Therapy Dates: current Prior Therapy Facilty/Provider(s): [facility] Reason for Treatment: depression Does patient have an ACCT team?: No Does patient have Intensive In-House Services?  : No Does patient have [facility] services? : No Does patient have P[facility] services?: No  Past Medical History:  Past Medical History:  Diagnosis Date  . Anemia   . Anxiety   . Chest pain   . Depression   . Gallstones   . Hepatitis C   . Hypertension    patient reports only during detox  . Irregular heart beat     Past Surgical History:  Procedure Laterality Date  . c sections    . TUBAL LIGATION     Family History:  Family History  Problem Relation Age of Onset  . Hypertension Father   . Pancreatic cancer Father    Family Psychiatric  History:  Social History:  Social History   Substance and Sexual Activity  Alcohol Use Yes  . Alcohol/week: 12.0 standard drinks  . Types: 12 Cans of beer per week    Comment: 1/5 vodka and 12 beers     Social History   Substance and Sexual Activity  Drug Use Yes  . Types: "Crack" cocaine   Comment: UDS not available at time of assessment    Social History   Socioeconomic History  . Marital status: Legally Separated    Spouse name: Not on file  . Number of children: Not on file  . Years of education: Not on file  . Highest education level: Not on file  Occupational History  . Not on file  Social Needs  . Financial resource strain: Not on file  . Food insecurity    Worry: Not on file    Inability: Not on file  . Transportation needs    Medical: Not on file    Non-medical: Not on file  Tobacco Use  . Smoking status: Current Every Day Smoker    Packs/day: 1.00    Types: Cigarettes  . Smokeless tobacco: Never Used  Substance and Sexual Activity  . Alcohol use: Yes    Alcohol/week: 12.0 standard drinks    Types: 12 Cans of beer per week    Comment: 1/5 vodka and 12 beers  . Drug use: Yes    Types: "Crack" cocaine  Comment: UDS not available at time of assessment  . Sexual activity: Yes    Birth control/protection: None  Lifestyle  . Physical activity    Days per week: Not on file    Minutes per session: Not on file  . Stress: Not on file  Relationships  . Social Musicianconnections    Talks on phone: Not on file    Gets together: Not on file    Attends religious service: Not on file    Active member of club or organization: Not on file    Attends meetings of clubs or organizations: Not on file    Relationship status: Not on file  Other Topics Concern  . Not on file  Social History Narrative   Pt lives in [place]; lives alone.     Additional Social History:    Allergies:   Allergies  Allergen Reactions  . Benzoyl Peroxide Shortness Of Breath and Swelling    Tongue became swollen    Labs:  Results for orders placed or performed during the hospital encounter of 02/06/19 (from the past 48 hour(s))  Comprehensive  metabolic panel     Status: Abnormal   Collection Time: 02/06/19  3:27 PM  Result Value Ref Range   Sodium 137 135 - 145 mmol/L   Potassium 4.3 3.5 - 5.1 mmol/L   Chloride 107 98 - 111 mmol/L   CO2 23 22 - 32 mmol/L   Glucose, Bld 72 70 - 99 mg/dL   BUN 19 6 - 20 mg/dL   Creatinine, Ser 1.611.22 (H) 0.44 - 1.00 mg/dL   Calcium 9.1 8.9 - 09.610.3 mg/dL   Total Protein 7.1 6.5 - 8.1 g/dL   Albumin 3.9 3.5 - 5.0 g/dL   AST 24 15 - 41 U/L   ALT 17 0 - 44 U/L   Alkaline Phosphatase 45 38 - 126 U/L   Total Bilirubin 0.5 0.3 - 1.2 mg/dL   GFR calc non Af Amer 59 (L) >60 mL/min   GFR calc Af Amer >60 >60 mL/min   Anion gap 7 5 - 15    Comment: Performed at [facility], [address] [place], [place] [address]  Ethanol     Status: None   Collection Time: 02/06/19  3:27 PM  Result Value Ref Range   Alcohol, Ethyl (B) <10 <10 mg/dL    Comment: (NOTE) Lowest detectable limit for serum alcohol is 10 mg/dL. For medical purposes only. Performed at [facility], [address] [place], [place] [address]   cbc     Status: Abnormal   Collection Time: 02/06/19  3:27 PM  Result Value Ref Range   WBC 8.6 4.0 - 10.5 K/uL   RBC 4.87 3.87 - 5.11 MIL/uL   Hemoglobin 13.0 12.0 - 15.0 g/dL   HCT 14.740.4 82.936.0 - 56.246.0 %   MCV 83.0 80.0 - 100.0 fL   MCH 26.7 26.0 - 34.0 pg   MCHC 32.2 30.0 - 36.0 g/dL   RDW 13.018.2 (H) 86.511.5 - 78.415.5 %   Platelets 270 150 - 400 K/uL   nRBC 0.0 0.0 - 0.2 %    Comment: Performed at [facility], [address] [place], [place] [address]  Salicylate level     Status: None   Collection Time: 02/06/19  3:27 PM  Result Value Ref Range   Salicylate Lvl <7.0 2.8 - 30.0 mg/dL    Comment: Performed at [facility], [address]  [place], [place] [address]  Acetaminophen level     Status: Abnormal   Collection Time: 02/06/19  3:27 PM  Result Value Ref Range   Acetaminophen (Tylenol), Serum <10 (L) 10 - 30 ug/mL    Comment: (NOTE) Therapeutic concentrations  vary significantly. A range of 10-30 ug/mL  may be an effective concentration for many patients. However, some  are best treated at concentrations outside of this range. Acetaminophen concentrations >150 ug/mL at 4 hours after ingestion  and >50 ug/mL at 12 hours after ingestion are often associated with  toxic reactions. Performed at [facility], [address], [place], [place] [address]   I-Stat beta hCG blood, ED     Status: None   Collection Time: 02/06/19  3:47 PM  Result Value Ref Range   I-stat hCG, quantitative <5.0 <5 mIU/mL   Comment 3            Comment:   GEST. AGE      CONC.  (mIU/mL)   <=1 WEEK        5 - 50     2 WEEKS       50 - 500     3 WEEKS       100 - 10,000     4 WEEKS     1,000 - 30,000        FEMALE AND NON-PREGNANT FEMALE:     LESS THAN 5 mIU/mL   Rapid urine drug screen (hospital performed)     Status: Abnormal   Collection Time: 02/06/19  4:14 PM  Result Value Ref Range   Opiates NONE DETECTED NONE DETECTED   Cocaine POSITIVE (A) NONE DETECTED   Benzodiazepines NONE DETECTED NONE DETECTED   Amphetamines NONE DETECTED NONE DETECTED   Tetrahydrocannabinol NONE DETECTED NONE DETECTED   Barbiturates NONE DETECTED NONE DETECTED    Comment: (NOTE) DRUG SCREEN FOR MEDICAL PURPOSES ONLY.  IF CONFIRMATION IS NEEDED FOR ANY PURPOSE, NOTIFY LAB WITHIN 5 DAYS. LOWEST DETECTABLE LIMITS FOR URINE DRUG SCREEN Drug Class                     Cutoff (ng/mL) Amphetamine and metabolites    1000 Barbiturate and metabolites    200 Benzodiazepine                 200 Tricyclics and metabolites     300 Opiates and metabolites        300 Cocaine and metabolites        300 THC                            50 Performed at [facility], [address], [place], [place] [address]   SARS Coronavirus 2 (CEPHEID - Performed in [facility] hospital lab), Hosp Order     Status: None   Collection Time: 02/06/19  9:20 PM   Specimen: Nasopharyngeal Swab  Result  Value Ref Range   SARS Coronavirus 2 NEGATIVE NEGATIVE    Comment: (NOTE) If result is NEGATIVE SARS-CoV-2 target nucleic acids are NOT DETECTED. The SARS-CoV-2 RNA is generally detectable in upper and lower  respiratory specimens during the acute phase of infection. The lowest  concentration of SARS-CoV-2 viral copies this assay can detect is 250  copies / mL. A negative result does not preclude SARS-CoV-2 infection  and should not be used as the sole basis for treatment or other  patient  management decisions.  A negative result may occur with  improper specimen collection / handling, submission of specimen other  than nasopharyngeal swab, presence of viral mutation(s) within the  areas targeted by this assay, and inadequate number of viral copies  (<250 copies / mL). A negative result must be combined with clinical  observations, patient history, and epidemiological information. If result is POSITIVE SARS-CoV-2 target nucleic acids are DETECTED. The SARS-CoV-2 RNA is generally detectable in upper and lower  respiratory specimens dur ing the acute phase of infection.  Positive  results are indicative of active infection with SARS-CoV-2.  Clinical  correlation with patient history and other diagnostic information is  necessary to determine patient infection status.  Positive results do  not rule out bacterial infection or co-infection with other viruses. If result is PRESUMPTIVE POSTIVE SARS-CoV-2 nucleic acids MAY BE PRESENT.   A presumptive positive result was obtained on the submitted specimen  and confirmed on repeat testing.  While 2019 novel coronavirus  (SARS-CoV-2) nucleic acids may be present in the submitted sample  additional confirmatory testing may be necessary for epidemiological  and / or clinical management purposes  to differentiate between  SARS-CoV-2 and other Sarbecovirus currently known to infect humans.  If clinically indicated additional testing with an  alternate test  methodology [phone]([id number]) is advised. The SARS-CoV-2 RNA is generally  detectable in upper and lower respiratory sp ecimens during the acute  phase of infection. The expected result is Negative. Fact Sheet for Patients:  BoilerBrush.com.cyhttps://www.fda.gov/media/136312/download Fact Sheet for Healthcare Providers: https://pope.com/https://www.fda.gov/media/136313/download This test is not yet approved or cleared by the [place] FDA and has been authorized for detection and/or diagnosis of SARS-CoV-2 by FDA under an Emergency Use Authorization (EUA).  This EUA will remain in effect (meaning this test can be used) for the duration of the COVID-19 declaration under Section 564(b)(1) of the Act, 21 U.S.C. section 360bbb-3(b)(1), unless the authorization is terminated or revoked sooner. Performed at [facility], [address] [place], [place] [address]     Medications:  Current Facility-Administered Medications  Medication Dose Route Frequency Provider Last Rate Last Dose  . ferrous sulfate tablet 325 mg  325 mg Oral Q breakfast Buel ReamLaw, Alexandra M, PA-C   325 mg at 02/07/19 [id number]  . ibuprofen (ADVIL) tablet 800 mg  800 mg Oral TID PRN Emi HolesLaw, Alexandra M, PA-C   800 mg at 02/07/19 [id number]  . LORazepam (ATIVAN) injection 0-4 mg  0-4 mg Intravenous Q6H Law, Waylan BogaAlexandra M, PA-C       Or  . LORazepam (ATIVAN) tablet 0-4 mg  0-4 mg Oral Q6H Law, Alexandra M, PA-C   2 mg at 02/07/19 0846  . [START ON 02/08/2019] LORazepam (ATIVAN) injection 0-4 mg  0-4 mg Intravenous Q12H Law, Alexandra M, PA-C       Or  . Melene Muller[START ON 02/08/2019] LORazepam (ATIVAN) tablet 0-4 mg  0-4 mg Oral Q12H Law, Alexandra M, PA-C      . nicotine (NICODERM CQ - dosed in mg/24 hours) patch 21 mg  21 mg Transdermal Daily Law, Alexandra M, PA-C   21 mg at 02/07/19 1028  . ondansetron (ZOFRAN) tablet 4 mg  4 mg Oral Q8H PRN Emi HolesLaw, Alexandra M, PA-C   4 mg at 02/06/19 1949  . sertraline (ZOLOFT) tablet 100 mg  100 mg Oral Daily Buel ReamLaw, Alexandra M, PA-C    100 mg at 02/07/19 1028  . thiamine (VITAMIN B-1) tablet 100 mg  100 mg Oral Daily Law,  Waylan Boga, PA-C   100 mg at 02/07/19 1028   Or  . thiamine (B-1) injection 100 mg  100 mg Intravenous Daily Law, Alexandra M, PA-C      . traZODone (DESYREL) tablet 100 mg  100 mg Oral QHS Law, Alexandra M, PA-C   100 mg at 02/06/19 2222   Current Outpatient Medications  Medication Sig Dispense Refill  . ferrous sulfate 325 (65 FE) MG tablet Take 325 mg by mouth daily with breakfast.    . gabapentin (NEURONTIN) 300 MG capsule Take 1 capsule (300 mg total) by mouth 3 (three) times daily. 90 capsule 0  . hydrOXYzine (ATARAX/VISTARIL) 25 MG tablet Take 1 tablet (25 mg total) by mouth every 6 (six) hours as needed for anxiety. (Patient taking differently: Take 25 mg by mouth every 6 (six) hours as needed ("for detox-related anxiety"). ) 30 tablet 0  . ibuprofen (ADVIL) 800 MG tablet Take 800 mg by mouth 3 (three) times daily as needed for headache or mild pain.    . Melatonin 5 MG TABS Take 15 mg by mouth at bedtime as needed (for sleep, when not taking Trazodone).     . nicotine (NICODERM CQ - DOSED IN MG/24 HOURS) 21 mg/24hr patch Place 1 patch (21 mg total) onto the skin daily. 28 patch 0  . sertraline (ZOLOFT) 100 MG tablet Take 1 tablet (100 mg total) by mouth daily. For mood 30 tablet 0  . traZODone (DESYREL) 100 MG tablet Take 100 mg by mouth at bedtime.    . ARIPiprazole (ABILIFY) 5 MG tablet Take 1 tablet (5 mg total) by mouth at bedtime. For mood (Patient not taking: Reported on 02/06/2019) 30 tablet 0  . Multiple Vitamin (MULTIVITAMIN WITH MINERALS) TABS tablet Take 1 tablet by mouth daily. (Patient not taking: Reported on 02/06/2019)    . traZODone (DESYREL) 150 MG tablet Take 1 tablet (150 mg total) by mouth at bedtime as needed for sleep. (Patient not taking: Reported on 02/06/2019) 30 tablet 0    Musculoskeletal: Strength & Muscle Tone: within normal limits Gait & Station: normal Patient leans:  N/A  Psychiatric Specialty Exam: Physical Exam  Vitals reviewed. Constitutional: She appears well-developed.  Cardiovascular: Normal rate.  Psychiatric: She has a normal mood and affect. Her behavior is normal.    Review of Systems  Psychiatric/Behavioral: Positive for depression and substance abuse. The patient is nervous/anxious.   All other systems reviewed and are negative.   Blood pressure 129/73, pulse 75, temperature 98.8 F (37.1 C), temperature source Oral, resp. rate 18, height  (1.549 m), weight 113.4 kg, last menstrual period 01/09/2019, SpO2 100 %.Body mass index is 47.24 kg/m.  General Appearance: Casual  Eye Contact:  Good  Speech:  Clear and Coherent  Volume:  Normal  Mood:  Anxious and Depressed  Affect:  Congruent  Thought Process:  Coherent  Orientation:  Full (Time, Place, and Person)  Thought Content:  Logical  Suicidal Thoughts:  Reported chronic suicidal ideations that are worsened with substance abuse use.  Homicidal Thoughts:  No  Memory:  Immediate;   Fair Recent;   Fair Remote;   Fair  Judgement:  Fair  Insight:  Fair  Psychomotor Activity:  Normal  Concentration:  Concentration: Fair  Recall:  [employer] of Knowledge:  Fair  Language:  Fair  Akathisia:  No  Handed:  Right  AIMS (if indicated):     Assets:  Communication Skills Desire for Improvement Resilience Social Support  ADL's:  Intact  Cognition:  WNL  Sleep:        NP attempted to follow-up with EDP for discharge disposition recommendations.  Disposition: Patient does not meet criteria for psychiatric inpatient admission. Supportive therapy provided about ongoing stressors. Refer to IOP. Discussed crisis plan, support from social network, calling 911, coming to the Emergency Department, and calling Suicide Hotline.  Social worker to follow-up with additional outpatient resources and/or transportation  This service was provided via telemedicine using a 2-way,  interactive audio and Radiographer, therapeutic.  Names of all persons participating in this telemedicine service and their role in this encounter. Name: Shirley Murphy Role: patient  Name: T.Lewis Role: Nurse practitioner          Derrill [place], NP 02/07/2019 11:51 AM

## 2019-02-07 NOTE — Progress Notes (Signed)
CSW was asked to contact [facility] to check on bed availability.  Staff person at [facility] indicated that, while they can not disclose bed availability because it changes daily and sometimes hourly, the patient was welcome to come to their walk-in clinic between 8 AM-3 PM tomorrow.  CSW reported this to [facility] CSW, [place] Who agreed to relay this information to patient.  CSW then received a call from [place], [email or web address]  who reported that patient said she was going to kill herself if she was discharged.  CSW discussed with [facility] Physician Extender, Ricky Ala and Admission Coordinator Oren Section., RN, who agreed that patient should be sent over to the [facility] Observation unit to be held for safety and security and reassessed in the morning by psychiatry.    Pt accepted to  [facility] Obs Bed 207-1 Shirley Murphy is the accepting provider.  Hampton Abbot, MD is the attending provider.  Call report to [phone]  Emmy @ [facility] ED notified.   Pt is  Voluntary.  Pt may be transported by [facility]  Pt scheduled  to arrive at [facility] as soon as transport is arranged.  Areatha Keas. Judi Cong, MSW, [place] Disposition Clinical Social Work [phone] (cell) [phone] (office)

## 2019-02-07 NOTE — Progress Notes (Signed)
D: Pt alert and oriented. Pt mood/affect flat/depressed. Pt denies experiencing any pain, SI/HI, or AVH at this time. Pt reports that when that her emotions get the best of her and that's when she drinks which makes her have thoughts of wanting to killer she self so she tries to OD. Pt reports that she wants to get help to detox.  Once dressed down pt thought she was going in pt and was under the impression that she was going to keep her belongings. Unit rules were explain and pt verbalized understanding.  Skin assessment preformed upon admission bruising bilat legs, scratches back and R arm, and tattoos bilat legs and chx.  A: Pt received admission education/information. Pt received orientation to unit and unit rules. Scheduled medications administered to pt, per MD orders. Support and encouragement provided. Frequent verbal contact made. Routine safety checks conducted q15 minutes.   R: Pt verbalized understanding of admission education/information. Pt verbalized understanding of unit rules. No adverse drug reactions noted. Pt verbally contracts for safety at this time. Pt complaint with medications and treatment plan. Pt interacts well with others on the unit. Pt remains safe at this time. Will continue to monitor.

## 2019-02-07 NOTE — Progress Notes (Signed)
Patient ID: Shirley Murphy, female   DOB: 03/18/1986, 33 y.o.   MRN: [id number] Pt A&O x 3, no acute distress noted, anxious & cooperative at present.  Remains passive SI.  Monitoring for safety.

## 2019-02-07 NOTE — Progress Notes (Signed)
[place] NOVEL CORONAVIRUS (COVID-19) DAILY CHECK-OFF SYMPTOMS - answer yes or no to each - every day NO YES  Have you had a fever in the past 24 hours?  . Fever (Temp > 37.80C / 100F) X   Have you had any of these symptoms in the past 24 hours? . New Cough .  Sore Throat  .  Shortness of Breath .  Difficulty Breathing .  Unexplained Body Aches   X   Have you had any one of these symptoms in the past 24 hours not related to allergies?   . Runny Nose .  Nasal Congestion .  Sneezing   X   If you have had runny nose, nasal congestion, sneezing in the past 24 hours, has it worsened?  X   EXPOSURES - check yes or no X   Have you traveled outside the state in the past 14 days?  X   Have you been in contact with someone with a confirmed diagnosis of COVID-19 or PUI in the past 14 days without wearing appropriate PPE?  X   Have you been living in the same home as a person with confirmed diagnosis of COVID-19 or a PUI (household contact)?    X   Have you been diagnosed with COVID-19?    X              What to do next: Answered NO to all: Answered YES to anything:   Proceed with unit schedule Follow the [facility] Inpatient Flowsheet.   

## 2019-02-07 NOTE — Progress Notes (Addendum)
2:45pm: CSW spoke with Romie Minus who stated that [facility] Sessions would assess and evaluate patient but that she would have to go firs thing tomorrow morning due to the walk in hours being done for the day. Walk in hours at [facility] stop at 2pm daily.   CSW informed Emmy, RN of plan and she discussed it with Agricultural consultant. Plan is for patient to be in observation at [facility].  2pm: CSW received consult for patient due to her request to speak with a Education officer, museum. CSW met with patient at bedside to identify her current SW needs. Patient reports being actively suicidal with intentions and a plan despite being cleared from TTS. Patient reports that she suffers from anxiety and major depression. Patient reports a history of suicide attempts. Patient reports that she took 80 Gabapentin the day prior to coming to [facility] ED for treatment with intention to take her own life. Patient reports that she has three children that were adopted to different families in a different state. Patient reports that her substance use worsens her depression. Patient reports that if she gets discharged from the hospital without assistance that she will kill herself. Patient reports she works third shift at [employer] and that it makes it hard on her mind. Patient reports abusing alcohol and drugs including cocaine and methaphetamines. Patient's UDS positive for cocaine. Patient refused to take resources from TTS provider earlier, however was agreeable to taking list from [facility] and patient discussed possible options for placement and patient agreeable for CSW to contact [facility] for a possible crisis bed.  CSW spoke with Toula Moos, LCSW to request [facility]'s contact information. Romie Minus is going to contact [facility] about bed availability and will return call to [facility].  Madilyn Fireman, MSW, LCSW-A Clinical Social Worker [facility] [phone]

## 2019-02-07 NOTE — ED Notes (Signed)
Social worker at bedside.

## 2019-02-07 NOTE — ED Provider Notes (Signed)
Emergency Medicine Observation Re-evaluation Note  Shirley Murphy is a 33 y.o. female, seen on rounds today.  Pt initially presented to the ED for complaints of Suicidal and Alcohol Problem Currently, the patient is awake, ambulatory without assistance.  Physical Exam  BP 129/73 (BP Location: Right Arm)   Pulse 75   Temp 98.8 F (37.1 C) (Oral)   Resp 18   Ht 5\' 1"  (1.549 m)   Wt 113.4 kg   LMP 01/09/2019   SpO2 100%   BMI 47.24 kg/m  Physical Exam Awake, alert, respirations even and unlabored. Reports feeling nauseous, feeling better than yesterday as she feels she has detoxed. ED Course / MDM  EKG:EKG Interpretation  Date/Time:  Sunday February 06 2019 15:33:44 EDT Ventricular Rate:  89 PR Interval:    QRS Duration: 104 QT Interval:  371 QTC Calculation: 452 R Axis:   -2 Text Interpretation:  Sinus rhythm RSR' in V1 or V2, right VCD or RVH Confirmed by Quintella Reichert [phone]) on 02/06/2019 3:39:27 PM  Clinical Course as of Feb 07 1207  Sun Feb 06, 2019  1544 I discussed patient case with poison control RN, Langley Gauss, who advised Tylenol level, liver function, baseline EKG, and 6-hour Obs.  Advised gabapentin can cause CNS depression and diarrhea.   [AL]    Clinical Course User Index [AL] Frederica Kuster, PA-C   I have reviewed the labs performed to date as well as medications administered while in observation.  Recent changes in the last 24 hours include [facility] evaluation complete. Plan  Current plan is for awaiting [facility] disposition . Patient is not under full IVC at this time. Requests zofran for nausea.   Tacy Learn, PA-C 02/07/19 1509    Charlesetta Shanks, MD 02/16/19 [phone]

## 2019-02-08 ENCOUNTER — Encounter (HOSPITAL_COMMUNITY): Payer: Self-pay | Admitting: Registered Nurse

## 2019-02-08 DIAGNOSIS — F331 Major depressive disorder, recurrent, moderate: Secondary | ICD-10-CM

## 2019-02-08 DIAGNOSIS — F1494 Cocaine use, unspecified with cocaine-induced mood disorder: Secondary | ICD-10-CM

## 2019-02-08 MED ORDER — NICOTINE 21 MG/24HR TD PT24: 21.0000 mg | MEDICATED_PATCH | Freq: Every day | TRANSDERMAL | Status: DC

## 2019-02-08 MED ORDER — NICOTINE 21 MG/24HR TD PT24
MEDICATED_PATCH | TRANSDERMAL | Status: AC
  Administered 2019-02-08: 11:00:00
  Filled 2019-02-08: qty 1

## 2019-02-08 NOTE — Progress Notes (Signed)
Pt slept through the night, resp.even & unlabored, no distress noted, calm & cooperative,  Will continue to monitor.

## 2019-02-08 NOTE — Progress Notes (Addendum)
Patient does not meet criteria for inpatient services but would benefit from Residential Substance Use Treatment.  CSW contacted [facility] and was asked to fax over patient's referral information.  Staff person that schedules appointments is not there as yet.  That staff person is to call back with the next available appointment.  CSW will continue to follow for placement.  Areatha Keas. Judi Cong, MSW, [place] Disposition Clinical Social Work [phone] (cell) [phone] (office)  CSW received call from June, Marketing executive for [employer].  They are familiar with Ms. Hutt and are willing to treat her.  She is currently #6 on the [facility] waitlist.  Bethena Roys explained that it may be possible for patient to get a bed in the next couple days as people on the waitlist frequently fall off.  Patient will be advised to keep in contact daily with [facility] and answer any calls coming from them.

## 2019-02-08 NOTE — Discharge Summary (Signed)
[facility] ED Discharge  02/08/2019 1:21 PM Shirley IonMinnie Murphy  MRN:  [id number] Principal Problem: Moderate cocaine use disorder [facility](HCC) Discharge Diagnoses: Principal Problem:   Moderate cocaine use disorder (HCC) Active Problems:   MDD (major depressive disorder), recurrent episode (HCC)   Substance induced mood disorder (HCC)   MDD (major depressive disorder)   Subjective: Patient reporting she is trying to get into a rehab facility.  States that she has spoken to [facility] and is suppose to call again today.  Patient denies suicidal/self-harm/homicidal ideation, psychosis, and paranoia.  Patient reports that she does have a prior history of suicide attempt and inpatient psychiatric treatment.  Came to hospital to get assistance getting into rehab. Patient seen face to face by this provider, Dr. Jola Babinskilary; and chart reviewed and consulted with Dr. Lucianne MussKumar on 02/08/19.  On evaluation Shirley Murphy stating want assistance to get into rehab.     During evaluation Shirley Murphy is sitting on side of bed; she is alert/oriented x 4; calm/cooperative; and mood congruent with affect.  Patient is speaking in a clear tone at moderate volume, and normal pace; with good eye contact.  Her thought process is coherent and relevant; There is no indication that she is currently responding to internal/external stimuli or experiencing delusional thought content.  Patient denies suicidal/self-harm/homicidal ideation, psychosis, and paranoia.  Patient has remained calm throughout assessment and has answered questions appropriately.   Total Time spent with patient: 30 minutes  Past Psychiatric History: Polysubstance abuse, Cocaine use disorder, alcohol use disorder, Major depression   Past Medical History:  Past Medical History:  Diagnosis Date  . Anemia   . Anxiety   . Chest pain   . Depression   . Gallstones   . Hepatitis C   . Hypertension    patient reports only during detox  . Irregular heart  beat     Past Surgical History:  Procedure Laterality Date  . c sections    . TUBAL LIGATION     Family History:  Family History  Problem Relation Age of Onset  . Hypertension Father   . Pancreatic cancer Father    Family Psychiatric  History: Denies Social History:  Social History   Substance and Sexual Activity  Alcohol Use Yes  . Alcohol/week: 12.0 standard drinks  . Types: 12 Cans of beer per week   Comment: 1/5 vodka and 12 beers     Social History   Substance and Sexual Activity  Drug Use Yes  . Types: "Crack" cocaine   Comment: UDS not available at time of assessment    Social History   Socioeconomic History  . Marital status: Legally Separated    Spouse name: Not on file  . Number of children: Not on file  . Years of education: Not on file  . Highest education level: Not on file  Occupational History  . Not on file  Social Needs  . Financial resource strain: Not on file  . Food insecurity    Worry: Not on file    Inability: Not on file  . Transportation needs    Medical: Not on file    Non-medical: Not on file  Tobacco Use  . Smoking status: Current Every Day Smoker    Packs/day: 1.00    Types: Cigarettes  . Smokeless tobacco: Never Used  Substance and Sexual Activity  . Alcohol use: Yes    Alcohol/week: 12.0 standard drinks    Types: 12 Cans of beer per week  Comment: 1/5 vodka and 12 beers  . Drug use: Yes    Types: "Crack" cocaine    Comment: UDS not available at time of assessment  . Sexual activity: Yes    Birth control/protection: None  Lifestyle  . Physical activity    Days per week: Not on file    Minutes per session: Not on file  . Stress: Not on file  Relationships  . Social Musicianconnections    Talks on phone: Not on file    Gets together: Not on file    Attends religious service: Not on file    Active member of club or organization: Not on file    Attends meetings of clubs or organizations: Not on file    Relationship status:  Not on file  Other Topics Concern  . Not on file  Social History Narrative   [place] in [place]; lives alone.      Has this patient used any form of tobacco in the last 30 days? (Cigarettes, Smokeless Tobacco, Cigars, and/or Pipes) A prescription for an FDA-approved tobacco cessation medication was offered at discharge and the patient refused  Current Medications: Current Facility-Administered Medications  Medication Dose Route Frequency Provider Last Rate Last Dose  . acetaminophen (TYLENOL) tablet 650 mg  650 mg Oral Q6H PRN Oneta RackLewis, Tanika N, NP   650 mg at 02/08/19 0926  . alum & mag hydroxide-simeth (MAALOX/MYLANTA) 200-200-20 MG/5ML suspension 30 mL  30 mL Oral Q4H PRN Oneta RackLewis, Tanika N, NP      . ARIPiprazole (ABILIFY) tablet 5 mg  5 mg Oral Daily Oneta RackLewis, Tanika N, NP   5 mg at 02/08/19 0813  . gabapentin (NEURONTIN) capsule 300 mg  300 mg Oral BID Oneta RackLewis, Tanika N, NP   300 mg at 02/08/19 [id number]  . hydrOXYzine (ATARAX/VISTARIL) tablet 25 mg  25 mg Oral TID PRN Oneta RackLewis, Tanika N, NP   25 mg at 02/08/19 0926  . magnesium hydroxide (MILK OF MAGNESIA) suspension 30 mL  30 mL Oral Daily PRN Oneta RackLewis, Tanika N, NP      . nicotine (NICODERM CQ - dosed in mg/24 hours) patch 21 mg  21 mg Transdermal Daily Noa Constante B, NP      . sertraline (ZOLOFT) tablet 100 mg  100 mg Oral Daily Oneta RackLewis, Tanika N, NP   100 mg at 02/08/19 0813  . traZODone (DESYREL) tablet 100 mg  100 mg Oral QHS PRN Oneta RackLewis, Tanika N, NP   100 mg at 02/07/19 2132   Current Outpatient Medications  Medication Sig Dispense Refill  . ARIPiprazole (ABILIFY) 5 MG tablet Take 1 tablet (5 mg total) by mouth at bedtime. For mood (Patient not taking: Reported on 02/06/2019) 30 tablet 0  . ferrous sulfate 325 (65 FE) MG tablet Take 325 mg by mouth daily with breakfast.    . gabapentin (NEURONTIN) 300 MG capsule Take 1 capsule (300 mg total) by mouth 3 (three) times daily. 90 capsule 0  . hydrOXYzine (ATARAX/VISTARIL) 25 MG tablet Take 1  tablet (25 mg total) by mouth every 6 (six) hours as needed for anxiety. (Patient taking differently: Take 25 mg by mouth every 6 (six) hours as needed ("for detox-related anxiety"). ) 30 tablet 0  . ibuprofen (ADVIL) 800 MG tablet Take 800 mg by mouth 3 (three) times daily as needed for headache or mild pain.    . Melatonin 5 MG TABS Take 15 mg by mouth at bedtime as needed (for sleep, when not taking Trazodone).     .Marland Kitchen  Multiple Vitamin (MULTIVITAMIN WITH MINERALS) TABS tablet Take 1 tablet by mouth daily. (Patient not taking: Reported on 02/06/2019)    . nicotine (NICODERM CQ - DOSED IN MG/24 HOURS) 21 mg/24hr patch Place 1 patch (21 mg total) onto the skin daily. 28 patch 0  . sertraline (ZOLOFT) 100 MG tablet Take 1 tablet (100 mg total) by mouth daily. For mood 30 tablet 0  . traZODone (DESYREL) 100 MG tablet Take 100 mg by mouth at bedtime.    . traZODone (DESYREL) 150 MG tablet Take 1 tablet (150 mg total) by mouth at bedtime as needed for sleep. (Patient not taking: Reported on 02/06/2019) 30 tablet 0   PTA Medications: No medications prior to admission.    Musculoskeletal: Strength & Muscle Tone: within normal limits Gait & Station: normal Patient leans: N/A  Psychiatric Specialty Exam: Physical Exam  Nursing note and vitals reviewed. Constitutional: She is oriented to person, place, and time. She appears well-developed and well-nourished. No distress.  Obese  Neck: Normal range of motion.  Respiratory: Effort normal.  Musculoskeletal: Normal range of motion.  Neurological: She is alert and oriented to person, place, and time.  Skin: Skin is warm and dry.  Psychiatric: She has a normal mood and affect. Her speech is normal and behavior is normal. Judgment normal. Thought content is not paranoid and not delusional. Cognition and memory are normal. She expresses no homicidal and no suicidal ideation.    Review of Systems  Psychiatric/Behavioral: Negative for memory loss.  Depression: Stable. Hallucinations: Denies. Substance abuse: Cocaine. Suicidal ideas: Denies  The patient does not have insomnia. Nervous/anxious: Stable.   All other systems reviewed and are negative.   Blood pressure 132/81, pulse 85, temperature 98.6 F (37 C), temperature source Oral, resp. rate 18, last menstrual period 01/09/2019, SpO2 100 %.There is no height or weight on file to calculate BMI.  General Appearance: Casual  Eye Contact:  Good  Speech:  Clear and Coherent and Normal Rate  Volume:  Normal  Mood:  Appropriate  Affect:  Appropriate and Congruent  Thought Process:  Coherent and Goal Directed  Orientation:  Full (Time, Place, and Person)  Thought Content:  WDL  Suicidal Thoughts:  No  Homicidal Thoughts:  No  Memory:  Immediate;   Good Recent;   Good  Judgement:  Intact  Insight:  Present  Psychomotor Activity:  Normal  Concentration:  Concentration: Good  Recall:  Good  Fund of Knowledge:  Good  Language:  Good  Akathisia:  No  Handed:  Right  AIMS (if indicated):     Assets:  Communication Skills Desire for Improvement Resilience  ADL's:  Intact  Cognition:  WNL  Sleep:        Demographic Factors:  Caucasian  Loss Factors: NA  Historical Factors: Prior suicide attempts  Risk Reduction Factors:   Religious beliefs about death  Continued Clinical Symptoms:  Alcohol/Substance Abuse/Dependencies Previous Psychiatric Diagnoses and Treatments  Cognitive Features That Contribute To Risk:  None    Suicide Risk:  Minimal: No identifiable suicidal ideation.  Patients presenting with no risk factors but with morbid ruminations; may be classified as minimal risk based on the severity of the depressive symptoms  Follow-up Information    Services, [facility]. Call.   Why: Call daily to get status on where you are on the waitlist. Contact information: [address] [place] [id number] [phone]           Plan Of  Care/Follow-up  recommendations:  Activity:  As tolerated Diet:  Heart healthy  Other:  Follow up with resouces given  Disposition: No evidence of imminent risk to self or others at present.   Patient does not meet criteria for psychiatric inpatient admission. Supportive therapy provided about ongoing stressors. Discussed crisis plan, support from social network, calling 911, coming to the Emergency Department, and calling Suicide Hotline.   Deisha Stull, NP 02/08/2019, 1:21 PM

## 2019-02-08 NOTE — Progress Notes (Signed)
Patient ID: Shirley Murphy, female   DOB: 07-Oct-1985, 33 y.o.   MRN: [id number]   Patient discharged per MD orders. Patient given education regarding follow-up appointments and medications. Patient denies any questions or concerns about these instructions. Patient was escorted to locker and given belongings before discharge to hospital lobby. Patient currently denies SI/HI and auditory and visual hallucinations on discharge.

## 2019-02-08 NOTE — Discharge Instructions (Addendum)
You are #6 on the [place] waitlist.  Please follow up daily with them to check your status.       [facility]      [address], [place] [address]      [phone]  For your behavioral health needs, you are advised to continue the treatment that you are currently receiving from the [facility]:       [facility]      [place], [place] [address]      [phone]  For your shelter needs, contact [facility] as soon as possible:       [facility]      [address]      [place], [place] [address]      [phone]

## 2019-02-08 NOTE — Progress Notes (Signed)
Patient ID: Shirley Murphy, female   DOB: [birth year], 33 y.o.   MRN: [id number] [place] NOVEL CORONAVIRUS (COVID-19) DAILY CHECK-OFF SYMPTOMS - answer yes or no to each - every day NO YES  Have you had a fever in the past 24 hours?  . Fever (Temp > 37.80C / 100F) X   Have you had any of these symptoms in the past 24 hours? . New Cough .  Sore Throat  .  Shortness of Breath .  Difficulty Breathing .  Unexplained Body Aches   X   Have you had any one of these symptoms in the past 24 hours not related to allergies?   . Runny Nose .  Nasal Congestion .  Sneezing   X   If you have had runny nose, nasal congestion, sneezing in the past 24 hours, has it worsened?  X   EXPOSURES - check yes or no X   Have you traveled outside the state in the past 14 days?  X   Have you been in contact with someone with a confirmed diagnosis of COVID-19 or PUI in the past 14 days without wearing appropriate PPE?  X   Have you been living in the same home as a person with confirmed diagnosis of COVID-19 or a PUI (household contact)?    X   Have you been diagnosed with COVID-19?    X              What to do next: Answered NO to all: Answered YES to anything:   Proceed with unit schedule Follow the [facility] Inpatient Flowsheet.

## 2019-02-08 NOTE — BH Assessment (Signed)
Shirley Murphy Assessment Progress Note  Per Myles Lipps, MD, this pt does not require psychiatric hospitalization at this time.  Pt is to be discharged from [facility].  [name], NP reports that pt is sixth on the wait list for screening for the [facility] residential facility.  This information has been included in pt's discharge instructions.  Also included is recommendation to continue treatment that she is currently receiving at the [facility], and contact information for [facility] for pt's shelter needs.  Pt's nurse, Maudie Mercury, has been notified.  Jalene Mullet, [place] Triage Specialist [phone]

## 2019-02-14 ENCOUNTER — Encounter (HOSPITAL_COMMUNITY): Payer: Self-pay | Admitting: Emergency Medicine

## 2019-02-14 ENCOUNTER — Emergency Department (HOSPITAL_COMMUNITY)
Admission: EM | Admit: 2019-02-14 | Discharge: 2019-02-16 | Disposition: A | Discharge status: Other Acute Inpatient Hospital | Payer: Self-pay | Attending: Emergency Medicine | Admitting: Emergency Medicine

## 2019-02-14 ENCOUNTER — Other Ambulatory Visit: Payer: Self-pay

## 2019-02-14 ENCOUNTER — Emergency Department (HOSPITAL_COMMUNITY): Payer: Self-pay

## 2019-02-14 DIAGNOSIS — F329 Major depressive disorder, single episode, unspecified: Secondary | ICD-10-CM | POA: Insufficient documentation

## 2019-02-14 DIAGNOSIS — R112 Nausea with vomiting, unspecified: Secondary | ICD-10-CM | POA: Insufficient documentation

## 2019-02-14 DIAGNOSIS — F17210 Nicotine dependence, cigarettes, uncomplicated: Secondary | ICD-10-CM | POA: Insufficient documentation

## 2019-02-14 DIAGNOSIS — Y901 Blood alcohol level of 20-39 mg/100 ml: Secondary | ICD-10-CM | POA: Insufficient documentation

## 2019-02-14 DIAGNOSIS — R0989 Other specified symptoms and signs involving the circulatory and respiratory systems: Secondary | ICD-10-CM | POA: Insufficient documentation

## 2019-02-14 DIAGNOSIS — Z20828 Contact with and (suspected) exposure to other viral communicable diseases: Secondary | ICD-10-CM | POA: Insufficient documentation

## 2019-02-14 DIAGNOSIS — T50902A Poisoning by unspecified drugs, medicaments and biological substances, intentional self-harm, initial encounter: Secondary | ICD-10-CM | POA: Insufficient documentation

## 2019-02-14 DIAGNOSIS — F10920 Alcohol use, unspecified with intoxication, uncomplicated: Secondary | ICD-10-CM | POA: Insufficient documentation

## 2019-02-14 LAB — CBC WITH DIFFERENTIAL/PLATELET
Abs Immature Granulocytes: 0.02 10*3/uL (ref 0.00–0.07)
Basophils Absolute: 0 10*3/uL (ref 0.0–0.1)
Basophils Relative: 1 %
Eosinophils Absolute: 0.1 10*3/uL (ref 0.0–0.5)
Eosinophils Relative: 2 %
HCT: 37.1 % (ref 36.0–46.0)
Hemoglobin: 11.4 g/dL — ABNORMAL LOW (ref 12.0–15.0)
Immature Granulocytes: 0 %
Lymphocytes Relative: 23 %
Lymphs Abs: 1.5 10*3/uL (ref 0.7–4.0)
MCH: 26.1 pg (ref 26.0–34.0)
MCHC: 30.7 g/dL (ref 30.0–36.0)
MCV: 84.9 fL (ref 80.0–100.0)
Monocytes Absolute: 0.6 10*3/uL (ref 0.1–1.0)
Monocytes Relative: 9 %
Neutro Abs: 4.3 10*3/uL (ref 1.7–7.7)
Neutrophils Relative %: 65 %
Platelets: 267 10*3/uL (ref 150–400)
RBC: 4.37 MIL/uL (ref 3.87–5.11)
RDW: 17.9 % — ABNORMAL HIGH (ref 11.5–15.5)
WBC: 6.6 10*3/uL (ref 4.0–10.5)
nRBC: 0 % (ref 0.0–0.2)

## 2019-02-14 LAB — HEPATIC FUNCTION PANEL
ALT: 22 U/L (ref 0–44)
AST: 25 U/L (ref 15–41)
Albumin: 3.6 g/dL (ref 3.5–5.0)
Alkaline Phosphatase: 53 U/L (ref 38–126)
Bilirubin, Direct: <0.1 mg/dL (ref 0.0–0.2)
Total Bilirubin: 0.2 mg/dL — ABNORMAL LOW (ref 0.3–1.2)
Total Protein: 6.6 g/dL (ref 6.5–8.1)

## 2019-02-14 LAB — I-STAT BETA HCG BLOOD, ED (MC, WL, AP ONLY): I-stat hCG, quantitative: <5 m[IU]/mL (ref <5)

## 2019-02-14 LAB — BASIC METABOLIC PANEL WITH GFR
Anion gap: 11 (ref 5–15)
BUN: 14 mg/dL (ref 6–20)
CO2: 17 mmol/L — ABNORMAL LOW (ref 22–32)
Calcium: 9.1 mg/dL (ref 8.9–10.3)
Chloride: 107 mmol/L (ref 98–111)
Creatinine, Ser: 0.8 mg/dL (ref 0.44–1.00)
GFR calc Af Amer: >60 mL/min
GFR calc non Af Amer: >60 mL/min
Glucose, Bld: 136 mg/dL — ABNORMAL HIGH (ref 70–99)
Potassium: 3.3 mmol/L — ABNORMAL LOW (ref 3.5–5.1)
Sodium: 135 mmol/L (ref 135–145)

## 2019-02-14 LAB — BRAIN NATRIURETIC PEPTIDE: B Natriuretic Peptide: 25.9 pg/mL (ref 0.0–100.0)

## 2019-02-14 LAB — SALICYLATE LEVEL: Salicylate Lvl: <7 mg/dL (ref 2.8–30.0)

## 2019-02-14 LAB — ACETAMINOPHEN LEVEL: Acetaminophen (Tylenol), Serum: <10 ug/mL — ABNORMAL LOW (ref 10–30)

## 2019-02-14 LAB — MAGNESIUM: Magnesium: 2.1 mg/dL (ref 1.7–2.4)

## 2019-02-14 LAB — ETHANOL: Alcohol, Ethyl (B): 27 mg/dL — ABNORMAL HIGH

## 2019-02-14 MED ORDER — LACTATED RINGERS IV BOLUS
1000.0000 mL | Freq: Once | INTRAVENOUS | Status: AC
  Administered 2019-02-14: 19:00:00 1000 mL via INTRAVENOUS

## 2019-02-14 NOTE — ED Notes (Signed)
X-ray at bedside

## 2019-02-14 NOTE — ED Triage Notes (Signed)
Patient arrives POV actively vomiting stating she ingested trazodone, gabapentin, cocaine and alcohol in the intention to hurt herself. Patient states she feels better after episode of emesis. Patient lethargic, responds to stimuli.

## 2019-02-14 NOTE — ED Notes (Signed)
IVC paperwork: original copy purple zone, and 3 copies with nurse.

## 2019-02-14 NOTE — ED Notes (Addendum)
Poison control Called: recommends to continue to monitor EKG within 4 hrs 5 hrs or longer cardiac monitor  Symptomatic supportive care observation 6 hours  Nurse to Call back to report significant changes.

## 2019-02-14 NOTE — ED Notes (Signed)
ED Provider at bedside. 

## 2019-02-14 NOTE — ED Notes (Signed)
Pt. responds to voice at this time. Pt. Able to answer questions.

## 2019-02-14 NOTE — ED Notes (Signed)
Main Lab to add BNP to previous collected blood work.

## 2019-02-14 NOTE — ED Notes (Signed)
Ivc'd-copy put in medical records, original was placed in folder in purple zone

## 2019-02-14 NOTE — ED Provider Notes (Signed)
[facility]   CSN: [id number] Arrival date & time: 02/14/19  1849    History   Chief Complaint Chief Complaint  Patient presents with  . Drug Overdose    HPI Shirley IonMinnie Murphy is a 33 y.o. female.     The history is provided by the patient and medical records.  Drug Overdose This is a new problem. The current episode started 1 to 2 hours ago. The problem occurs constantly. The problem has been gradually worsening. Pertinent negatives include no chest pain, no abdominal pain, no headaches and no shortness of breath. Exacerbated by: emesis, retching. Nothing relieves the symptoms. She has tried rest for the symptoms. The treatment provided no relief.    Past Medical History:  Diagnosis Date  . Anemia   . Anxiety   . Chest pain   . Depression   . Gallstones   . Hepatitis C   . Hypertension    patient reports only during detox  . Irregular heart beat     Patient Active Problem List   Diagnosis Date Noted  . MDD (major depressive disorder) 02/07/2019  . Polysubstance abuse (HCC) 12/10/2018  . 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 Acute hepatitis 08/13/2018  . Nausea & vomiting 08/13/2018  . Overdose 02/28/2018  . Drug overdose 02/28/2018  . Severe recurrent major depression without psychotic features (HCC) 02/24/2018  . Chronic hepatitis C without hepatic coma (HCC) 02/23/2018  . Alcohol dependence with alcohol-induced mood disorder (HCC) 02/02/2018  . MDD (major depressive disorder), recurrent severe, without psychosis (HCC) 02/01/2018  . Moderate cocaine use disorder (HCC) 06/12/2017  . Alcohol abuse with alcohol-induced mood disorder (HCC) 06/12/2017  . Substance induced mood disorder (HCC) 03/04/2017  . MDD (major depressive disorder), recurrent episode (HCC) 03/02/2017  . Suicide attempt (HCC) 10/15/2016  . HTN (hypertension), benign 10/15/2016  . Irregular  heart beat 10/15/2016  . Suicidal ideation 10/15/2016  . Alcoholic intoxication without complication [facility](HCC)     Past Surgical History:  Procedure Laterality Date  . c sections    . TUBAL LIGATION       OB History   No obstetric history on file.      Home Medications    Prior to Admission medications   Medication Sig Start Date End Date Taking? Authorizing Provider  ARIPiprazole (ABILIFY) 5 MG tablet Take 1 tablet (5 mg total) by mouth at bedtime. For mood Patient not taking: Reported on 02/06/2019 12/10/18   Aldean BakerSykes, Janet E, NP  ferrous sulfate 325 (65 FE) MG tablet Take 325 mg by mouth daily with breakfast.    [provider]  gabapentin (NEURONTIN) 300 MG capsule Take 1 capsule (300 mg total) by mouth 3 (three) times daily. 12/10/18   Aldean BakerSykes, Janet E, NP  hydrOXYzine (ATARAX/VISTARIL) 25 MG tablet Take 1 tablet (25 mg total) by mouth every 6 (six) hours as needed for anxiety. Patient taking differently: Take 25 mg by mouth every 6 (six) hours as needed ("for detox-related anxiety").  12/10/18   Aldean BakerSykes, Janet E, NP  ibuprofen (ADVIL) 800 MG tablet Take 800 mg by mouth 3 (three) times daily as needed for headache or mild pain.    [provider]  Melatonin 5 MG TABS Take 15 mg by mouth at bedtime as needed (for sleep, when not taking Trazodone).     [provider]  Multiple Vitamin (MULTIVITAMIN WITH MINERALS) TABS tablet Take 1 tablet by mouth daily. Patient  not taking: Reported on 02/06/2019 12/10/18   Connye Burkitt, NP  nicotine (NICODERM CQ - DOSED IN MG/24 HOURS) 21 mg/24hr patch Place 1 patch (21 mg total) onto the skin daily. 12/10/18   Connye Burkitt, NP  sertraline (ZOLOFT) 100 MG tablet Take 1 tablet (100 mg total) by mouth daily. For mood 12/10/18   Connye Burkitt, NP  traZODone (DESYREL) 100 MG tablet Take 100 mg by mouth at bedtime.    [provider]  traZODone (DESYREL) 150 MG tablet Take 1 tablet (150 mg total) by mouth at bedtime as needed  for sleep. Patient not taking: Reported on 02/06/2019 12/10/18   Connye Burkitt, NP    Family History Family History  Problem Relation Age of Onset  . Hypertension Father   . Pancreatic cancer Father     Social History Social History   Tobacco Use  . Smoking status: Current Every Day Smoker    Packs/day: 1.00    Types: Cigarettes  . Smokeless tobacco: Never Used  Substance Use Topics  . Alcohol use: Yes    Alcohol/week: 12.0 standard drinks    Types: 12 Cans of beer per week    Comment: 1/5 vodka and 12 beers  . Drug use: Yes    Types: "Crack" cocaine    Comment: UDS not available at time of assessment     Allergies   Benzoyl peroxide   Review of Systems Review of Systems  Respiratory: Negative for shortness of breath.   Cardiovascular: Negative for chest pain.  Gastrointestinal: Negative for abdominal pain.  Neurological: Negative for headaches.  All other systems reviewed and are negative.    Physical Exam Updated Vital Signs BP (!) 118/52   Pulse 77   Temp 98.3 F (36.8 C) (Oral)   Resp 18   Ht 5\' 1"  (1.549 m)   Wt 113.4 kg   LMP 02/14/2019   SpO2 98%   BMI 47.24 kg/m   Physical Exam Vitals signs and nursing note reviewed.  Constitutional:      Appearance: She is well-developed. She is ill-appearing.  HENT:     Head: Normocephalic and atraumatic.     Mouth/Throat:     Mouth: Mucous membranes are dry.  Eyes:     Extraocular Movements: Extraocular movements intact.     Conjunctiva/sclera: Conjunctivae normal.     Pupils: Pupils are equal, round, and reactive to light.     Comments: Pupils 2 mm bilaterally  Neck:     Musculoskeletal: Neck supple. No neck rigidity or muscular tenderness.  Cardiovascular:     Rate and Rhythm: Normal rate.  Pulmonary:     Effort: Pulmonary effort is normal. No respiratory distress.     Breath sounds: No stridor. Rhonchi present. No wheezing.  Abdominal:     Palpations: Abdomen is soft.     Tenderness: There  is no abdominal tenderness.  Musculoskeletal:     Right lower leg: No edema.     Left lower leg: No edema.  Skin:    General: Skin is warm and dry.     Capillary Refill: Capillary refill takes 2 to 3 seconds.     Findings: No rash.  Neurological:     Mental Status: She is alert. She is disoriented.     Comments: Patient appears clinically intoxicated, moves all extremities spontaneously, variably cooperative with neurologic testing, oriented to self, endorses that she overdosed in a suicide attempt      ED Treatments / Results  Labs (all labs ordered are listed, but only abnormal results are displayed) Labs Reviewed  CBC WITH DIFFERENTIAL/PLATELET - Abnormal; Notable for the following components:      Result Value   Hemoglobin 11.4 (*)    RDW 17.9 (*)    All other components within normal limits  BASIC METABOLIC PANEL - Abnormal; Notable for the following components:   Potassium 3.3 (*)    CO2 17 (*)    Glucose, Bld 136 (*)    All other components within normal limits  HEPATIC FUNCTION PANEL - Abnormal; Notable for the following components:   Total Bilirubin 0.2 (*)    All other components within normal limits  ETHANOL - Abnormal; Notable for the following components:   Alcohol, Ethyl (B) 27 (*)    All other components within normal limits  ACETAMINOPHEN LEVEL - Abnormal; Notable for the following components:   Acetaminophen (Tylenol), Serum <10 (*)    All other components within normal limits  SALICYLATE LEVEL  MAGNESIUM  BRAIN NATRIURETIC PEPTIDE  URINALYSIS, ROUTINE W REFLEX MICROSCOPIC  RAPID URINE DRUG SCREEN, HOSP PERFORMED  I-STAT BETA HCG BLOOD, ED ([facility], [facility], [facility] ONLY)    EKG EKG Interpretation  Date/Time:  Monday February 14 2019 19:10:59 EDT Ventricular Rate:  79 PR Interval:    QRS Duration: 108 QT Interval:  423 QTC Calculation: 485 R Axis:   16 Text Interpretation:  Sinus rhythm Borderline short PR interval Borderline prolonged QT interval Confirmed by  Virgina NorfolkAdam, [place] [phone]) on 02/14/2019 9:34:01 PM   Radiology Dg Chest Portable 1 View  Result Date: 02/14/2019 CLINICAL DATA:  Overdose. EXAM: PORTABLE CHEST 1 VIEW COMPARISON:  03/28/2017 FINDINGS: The heart size is enlarged. There is is been it has of volume overload/pulmonary edema. There is no pneumothorax. No large pleural effusion. The lung volumes are low. There is no acute osseous abnormality. IMPRESSION: 1. Mild cardiac enlargement with evidence of volume overload/developing pulmonary edema. 2. Low lung volumes. Electronically Signed   By: Katherine Mantlehristopher  Green M.D.   On: 02/14/2019 19:27    Procedures Procedures (including critical care time)  Medications Ordered in ED Medications  lactated ringers bolus 1,000 mL (0 mLs Intravenous Stopped 02/14/19 2230)     Initial Impression / Assessment and Plan / ED Course  I have reviewed the triage vital signs and the nursing notes.  Pertinent labs & imaging results that were available during my care of the patient were reviewed by me and considered in my medical decision making (see chart for details).        Medical Decision Making: Shirley IonMinnie Domke is a 33 y.o. female who presented to the ED today status post suicide attempt.  Reviewed and confirmed nursing documentation for past medical history, family history, social history.  On my initial exam, the pt was ill-appearing, clinically intoxicated, sleepy but responsive to verbal stimuli, currently protecting airway, not tachycardic, not hypotensive, room air 96%, rhonchorous breath sounds throughout, GCS 3, 4, 5. Patient endorses ingesting an unknown amount of cocaine plus alcohol plus trazodone plus gabapentin 1 hour ago Patient actively vomiting in emergency department  EKG (my interpretation):      NS rhythm with a rate of  86.      QRS 107. QTc 475. PR 108.      No acute ischemic ST-T segment changes.      No acute changes suggestive of hyperkalemia.      No WPW, LQTS, or  Brugada's Syndrome.  IVC paperwork filled out.  Discussed  case with poison control, 6-hour observation period then medically cleared All radiology and laboratory studies reviewed independently and with my attending physician, agree with reading provided by radiologist unless otherwise noted.  Chest x-ray shows query cardiac enlargement with concern for volume overload, patient on room air, no new oxygen requirement, no history of heart failure, BNP negative Pregnancy test negative, alcohol level elevated, CO2 17, potassium 3.3 Upon reassessing patient, patient was calm, resting comfortably, end-tidal CO2 in place good waveform, 22, O2 sat 96% on room air, awakes to verbal stimuli After metabolized, cleared by poison control will consult psychiatry.  Care of patient signed out at 2300, please see oncoming provider's note for further details.  The above care was discussed with and agreed upon by my attending physician. Emergency Department Medication Summary:  Medications  lactated ringers bolus 1,000 mL (0 mLs Intravenous Stopped 02/14/19 2230)   Final Clinical Impressions(s) / ED Diagnoses   Final diagnoses:  None    ED Discharge Orders    None       Erick Alleyasey, Madaline Lefeber, MD 02/14/19 2304    Virgina Norfolkuratolo, Adam, DO 02/14/19 2313

## 2019-02-14 NOTE — ED Notes (Signed)
Poison control called and states to get EKG and monitor for QT interval, give benzos for seizures, checks potassium and magnesium level. Monitor for a minimum of 6 hours.

## 2019-02-15 LAB — SARS CORONAVIRUS 2 BY RT PCR (HOSPITAL ORDER, PERFORMED IN ~~LOC~~ HOSPITAL LAB): SARS Coronavirus 2: NEGATIVE

## 2019-02-15 LAB — URINALYSIS, ROUTINE W REFLEX MICROSCOPIC
Bilirubin Urine: NEGATIVE
Glucose, UA: NEGATIVE mg/dL
Ketones, ur: NEGATIVE mg/dL
Leukocytes,Ua: NEGATIVE
Nitrite: NEGATIVE
Protein, ur: NEGATIVE mg/dL
Specific Gravity, Urine: 1.023 (ref 1.005–1.030)
pH: 5 (ref 5.0–8.0)

## 2019-02-15 LAB — RAPID URINE DRUG SCREEN, HOSP PERFORMED
Amphetamines: POSITIVE — AB
Barbiturates: NOT DETECTED
Benzodiazepines: NOT DETECTED
Cocaine: POSITIVE — AB
Opiates: NOT DETECTED
Tetrahydrocannabinol: NOT DETECTED

## 2019-02-15 MED ORDER — POTASSIUM CHLORIDE CRYS ER 20 MEQ PO TBCR
40.0000 meq | EXTENDED_RELEASE_TABLET | Freq: Once | ORAL | Status: AC
  Administered 2019-02-15: 40 meq via ORAL
  Filled 2019-02-15: qty 2

## 2019-02-15 MED ORDER — ONDANSETRON 4 MG PO TBDP
4.0000 mg | ORAL_TABLET | ORAL | Status: DC
  Administered 2019-02-15 – 2019-02-16 (×5): 4 mg via ORAL
  Filled 2019-02-15 (×5): qty 1

## 2019-02-15 MED ORDER — LORAZEPAM 1 MG PO TABS
0.0000 mg | ORAL_TABLET | Freq: Four times a day (QID) | ORAL | Status: DC
  Administered 2019-02-15: 1 mg via ORAL
  Administered 2019-02-15 – 2019-02-16 (×3): 2 mg via ORAL
  Filled 2019-02-15 (×3): qty 2
  Filled 2019-02-15: qty 1
  Filled 2019-02-15: qty 2

## 2019-02-15 MED ORDER — LORAZEPAM 2 MG/ML IJ SOLN: 0.0000 mg | Freq: Four times a day (QID) | INTRAMUSCULAR | Status: DC

## 2019-02-15 MED ORDER — IBUPROFEN 800 MG PO TABS
800.0000 mg | ORAL_TABLET | Freq: Three times a day (TID) | ORAL | Status: DC | PRN
  Administered 2019-02-16 (×2): 800 mg via ORAL
  Filled 2019-02-15 (×2): qty 1

## 2019-02-15 MED ORDER — LORAZEPAM 1 MG PO TABS
2.0000 mg | ORAL_TABLET | Freq: Once | ORAL | Status: AC
  Administered 2019-02-15: 2 mg via ORAL

## 2019-02-15 MED ORDER — SERTRALINE HCL 100 MG PO TABS
100.0000 mg | ORAL_TABLET | Freq: Every day | ORAL | Status: DC
  Administered 2019-02-15 – 2019-02-16 (×2): 100 mg via ORAL
  Filled 2019-02-15 (×2): qty 1

## 2019-02-15 MED ORDER — THIAMINE HCL 100 MG/ML IJ SOLN: 100.0000 mg | Freq: Every day | INTRAMUSCULAR | Status: DC

## 2019-02-15 MED ORDER — DICYCLOMINE HCL 10 MG PO CAPS
20.0000 mg | ORAL_CAPSULE | Freq: Once | ORAL | Status: AC
  Administered 2019-02-15: 20 mg via ORAL
  Filled 2019-02-15: qty 2

## 2019-02-15 MED ORDER — VITAMIN B-1 100 MG PO TABS
100.0000 mg | ORAL_TABLET | Freq: Every day | ORAL | Status: DC
  Administered 2019-02-15 – 2019-02-16 (×2): 100 mg via ORAL
  Filled 2019-02-15 (×2): qty 1

## 2019-02-15 MED ORDER — LORAZEPAM 1 MG PO TABS: 0.0000 mg | ORAL_TABLET | Freq: Two times a day (BID) | ORAL | Status: DC

## 2019-02-15 MED ORDER — GABAPENTIN 300 MG PO CAPS: 300.0000 mg | ORAL_CAPSULE | Freq: Three times a day (TID) | ORAL | Status: DC

## 2019-02-15 MED ORDER — LORAZEPAM 2 MG/ML IJ SOLN: 0.0000 mg | Freq: Two times a day (BID) | INTRAMUSCULAR | Status: DC

## 2019-02-15 MED ORDER — NICOTINE 21 MG/24HR TD PT24
21.0000 mg | MEDICATED_PATCH | Freq: Once | TRANSDERMAL | Status: AC
  Administered 2019-02-15: 21 mg via TRANSDERMAL
  Filled 2019-02-15: qty 1

## 2019-02-15 MED ORDER — FERROUS SULFATE 325 (65 FE) MG PO TABS
325.0000 mg | ORAL_TABLET | Freq: Every day | ORAL | Status: DC
  Administered 2019-02-16: 325 mg via ORAL
  Filled 2019-02-15: qty 1

## 2019-02-15 NOTE — ED Notes (Signed)
Stretcher switched for hospital bed for comfort

## 2019-02-15 NOTE — ED Notes (Signed)
TTS in progress 

## 2019-02-15 NOTE — ED Notes (Signed)
Dinner tray ordered.

## 2019-02-15 NOTE — ED Notes (Signed)
Breakfast received.

## 2019-02-15 NOTE — ED Notes (Signed)
Pt made phone call. 
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
## 2019-02-15 NOTE — ED Notes (Signed)
Snack provided

## 2019-02-15 NOTE — ED Notes (Signed)
Snack provided by sitter

## 2019-02-15 NOTE — Progress Notes (Signed)
Pt meets inpatient criteria per Mordecai Maes, NP. Referral information has been sent to the following hospitals for review:  [facility]  [facility]  [facility]  [facility]  [facility]  [facility]      Disposition will continue to assist with inpatient placement needs.   Audree Camel, LCSW, [place] Disposition [facility]/TTS [phone]

## 2019-02-15 NOTE — ED Notes (Signed)
Pt ambulatory to bathroom

## 2019-02-15 NOTE — ED Notes (Signed)
Ordered bfast 

## 2019-02-15 NOTE — ED Notes (Signed)
Pt laying in bed, calm and coroperative at this time.

## 2019-02-15 NOTE — ED Notes (Signed)
Pt. Dressed in purple scrubs and wanded by security.

## 2019-02-16 MED ORDER — NICOTINE 21 MG/24HR TD PT24
21.0000 mg | MEDICATED_PATCH | Freq: Once | TRANSDERMAL | Status: DC
  Administered 2019-02-16: 21 mg via TRANSDERMAL
  Filled 2019-02-16: qty 1

## 2019-02-16 NOTE — Progress Notes (Addendum)
Pt accepted to [address] [facility], [place]. 2 Hamilton Capri, MD is the accepting provider.  Call report to [phone] Emmy @ [facility] Psych ED notified.   Pt is IVC'd FAX IVC to [phone] Pt may be transported by Law Enforcement Pt scheduled  to arrive at [facility] as soon as transport can be arranged.  Areatha Keas. Judi Cong, MSW, [place] Disposition Clinical Social Work [phone] (cell) [phone] (office)

## 2019-02-16 NOTE — ED Notes (Signed)
Patient given breakfast tray.

## 2019-02-16 NOTE — ED Provider Notes (Signed)
Pt reports her nausea has improved since taking zofran this am.   Pt states only mild nausea currently.  No other complaints,  Vitals normal     Fransico Meadow, [place] 02/16/19 1128    Pattricia Boss, MD 02/17/19 [phone]

## 2019-02-16 NOTE — ED Notes (Signed)
Diet was ordered for Lunch. 

## 2019-02-16 NOTE — ED Notes (Signed)
Patient given lunch tray.

## 2019-02-16 NOTE — ED Notes (Signed)
Patient given a snack and drink. 

## 2019-04-20 ENCOUNTER — Emergency Department (HOSPITAL_BASED_OUTPATIENT_CLINIC_OR_DEPARTMENT_OTHER)
Admission: EM | Admit: 2019-04-20 | Discharge: 2019-04-20 | Disposition: A | Discharge status: Home / Self Care | Payer: Self-pay | Attending: Emergency Medicine | Admitting: Emergency Medicine

## 2019-04-20 ENCOUNTER — Encounter (HOSPITAL_BASED_OUTPATIENT_CLINIC_OR_DEPARTMENT_OTHER): Payer: Self-pay

## 2019-04-20 ENCOUNTER — Other Ambulatory Visit: Payer: Self-pay

## 2019-04-20 DIAGNOSIS — I10 Essential (primary) hypertension: Secondary | ICD-10-CM | POA: Insufficient documentation

## 2019-04-20 DIAGNOSIS — R51 Headache: Secondary | ICD-10-CM | POA: Insufficient documentation

## 2019-04-20 DIAGNOSIS — Z79899 Other long term (current) drug therapy: Secondary | ICD-10-CM | POA: Insufficient documentation

## 2019-04-20 DIAGNOSIS — F17210 Nicotine dependence, cigarettes, uncomplicated: Secondary | ICD-10-CM | POA: Insufficient documentation

## 2019-04-20 DIAGNOSIS — R600 Localized edema: Secondary | ICD-10-CM | POA: Insufficient documentation

## 2019-04-20 DIAGNOSIS — R519 Headache, unspecified: Secondary | ICD-10-CM

## 2019-04-20 LAB — CBC WITH DIFFERENTIAL/PLATELET
Abs Immature Granulocytes: 0.02 10*3/uL (ref 0.00–0.07)
Basophils Absolute: 0 10*3/uL (ref 0.0–0.1)
Basophils Relative: 0 %
Eosinophils Absolute: 0.1 10*3/uL (ref 0.0–0.5)
Eosinophils Relative: 2 %
HCT: 38.6 % (ref 36.0–46.0)
Hemoglobin: 12.2 g/dL (ref 12.0–15.0)
Immature Granulocytes: 0 %
Lymphocytes Relative: 24 %
Lymphs Abs: 1.3 10*3/uL (ref 0.7–4.0)
MCH: 27.7 pg (ref 26.0–34.0)
MCHC: 31.6 g/dL (ref 30.0–36.0)
MCV: 87.7 fL (ref 80.0–100.0)
Monocytes Absolute: 0.5 10*3/uL (ref 0.1–1.0)
Monocytes Relative: 10 %
Neutro Abs: 3.5 10*3/uL (ref 1.7–7.7)
Neutrophils Relative %: 64 %
Platelets: 259 10*3/uL (ref 150–400)
RBC: 4.4 MIL/uL (ref 3.87–5.11)
RDW: 15.1 % (ref 11.5–15.5)
WBC: 5.5 10*3/uL (ref 4.0–10.5)
nRBC: 0 % (ref 0.0–0.2)

## 2019-04-20 LAB — URINALYSIS, ROUTINE W REFLEX MICROSCOPIC
Bilirubin Urine: NEGATIVE
Glucose, UA: NEGATIVE mg/dL
Hgb urine dipstick: NEGATIVE
Ketones, ur: NEGATIVE mg/dL
Leukocytes,Ua: NEGATIVE
Nitrite: NEGATIVE
Protein, ur: NEGATIVE mg/dL
Specific Gravity, Urine: 1.01 (ref 1.005–1.030)
pH: 6 (ref 5.0–8.0)

## 2019-04-20 LAB — RAPID URINE DRUG SCREEN, HOSP PERFORMED
Amphetamines: NOT DETECTED
Barbiturates: NOT DETECTED
Benzodiazepines: NOT DETECTED
Cocaine: NOT DETECTED
Opiates: NOT DETECTED
Tetrahydrocannabinol: NOT DETECTED

## 2019-04-20 LAB — COMPREHENSIVE METABOLIC PANEL
ALT: 19 U/L (ref 0–44)
AST: 21 U/L (ref 15–41)
Albumin: 4 g/dL (ref 3.5–5.0)
Alkaline Phosphatase: 44 U/L (ref 38–126)
Anion gap: 10 (ref 5–15)
BUN: 18 mg/dL (ref 6–20)
CO2: 23 mmol/L (ref 22–32)
Calcium: 9.2 mg/dL (ref 8.9–10.3)
Chloride: 103 mmol/L (ref 98–111)
Creatinine, Ser: 0.83 mg/dL (ref 0.44–1.00)
GFR calc Af Amer: >60 mL/min (ref >60)
GFR calc non Af Amer: >60 mL/min (ref >60)
Glucose, Bld: 85 mg/dL (ref 70–99)
Potassium: 4 mmol/L (ref 3.5–5.1)
Sodium: 136 mmol/L (ref 135–145)
Total Bilirubin: 0.4 mg/dL (ref 0.3–1.2)
Total Protein: 7.2 g/dL (ref 6.5–8.1)

## 2019-04-20 LAB — PREGNANCY, URINE: Preg Test, Ur: NEGATIVE

## 2019-04-20 LAB — ETHANOL: Alcohol, Ethyl (B): <10 mg/dL (ref <10)

## 2019-04-20 LAB — TSH: TSH: 3.768 u[IU]/mL (ref 0.350–4.500)

## 2019-04-20 MED ORDER — SODIUM CHLORIDE 0.9 % IV BOLUS
1000.0000 mL | Freq: Once | INTRAVENOUS | Status: AC
  Administered 2019-04-20: 1000 mL via INTRAVENOUS

## 2019-04-20 MED ORDER — SODIUM CHLORIDE 0.9 % IV SOLN
INTRAVENOUS | Status: DC
  Administered 2019-04-20: 12:00:00 via INTRAVENOUS

## 2019-04-20 MED ORDER — METOCLOPRAMIDE HCL 5 MG/ML IJ SOLN
10.0000 mg | Freq: Once | INTRAMUSCULAR | Status: AC
  Administered 2019-04-20: 12:00:00 10 mg via INTRAVENOUS
  Filled 2019-04-20: qty 2

## 2019-04-20 MED ORDER — DIPHENHYDRAMINE HCL 50 MG/ML IJ SOLN
12.5000 mg | Freq: Once | INTRAMUSCULAR | Status: AC
  Administered 2019-04-20: 12.5 mg via INTRAVENOUS
  Filled 2019-04-20: qty 1

## 2019-04-20 MED ORDER — KETOROLAC TROMETHAMINE 15 MG/ML IJ SOLN
15.0000 mg | Freq: Once | INTRAMUSCULAR | Status: AC
  Administered 2019-04-20: 15 mg via INTRAVENOUS
  Filled 2019-04-20: qty 1

## 2019-04-20 MED ORDER — IBUPROFEN 800 MG PO TABS: 800.0000 mg | ORAL_TABLET | Freq: Three times a day (TID) | ORAL | 0 refills | Status: AC | PRN

## 2019-04-20 NOTE — ED Triage Notes (Signed)
Pt c/o HA x 1-2 weeks-"swelling all over" x 1 month-NAD-steady gait

## 2019-04-20 NOTE — ED Provider Notes (Signed)
[facility] EMERGENCY DEPARTMENT Provider Note   CSN: [id number] Arrival date & time: 04/20/19  1057     History   Chief Complaint Chief Complaint  Patient presents with  . Headache    HPI Shirley Murphy is a 33 y.o. female.     Pt presents to the ED today with a headache and generalized swelling.  She has had the headache for the past 2 weeks.  The swelling has been for a few months.  Her pcp did put her on 20 mg of lasix which did not help.  When the rx ran out, she did not get the med refilled.  She does have a hx of polysubstance abuse and said she has been clean since her d/c from the hospital at the end of June.     Past Medical History:  Diagnosis Date  . Anemia   . Anxiety   . Chest pain   . Depression   . Gallstones   . Hepatitis C   . Hypertension    patient reports only during detox  . Irregular heart beat     Patient Active Problem List   Diagnosis Date Noted  . MDD (major depressive disorder) 02/07/2019  . Polysubstance abuse (HCC) 12/10/2018  . 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 Acute hepatitis 08/13/2018  . Nausea & vomiting 08/13/2018  . Overdose 02/28/2018  . Drug overdose 02/28/2018  . Severe recurrent major depression without psychotic features (HCC) 02/24/2018  . Chronic hepatitis C without hepatic coma (HCC) 02/23/2018  . Alcohol dependence with alcohol-induced mood disorder (HCC) 02/02/2018  . MDD (major depressive disorder), recurrent severe, without psychosis (HCC) 02/01/2018  . Moderate cocaine use disorder (HCC) 06/12/2017  . Alcohol abuse with alcohol-induced mood disorder (HCC) 06/12/2017  . Substance induced mood disorder (HCC) 03/04/2017  . MDD (major depressive disorder), recurrent episode (HCC) 03/02/2017  . Suicide attempt (HCC) 10/15/2016  . HTN (hypertension), benign 10/15/2016  . Irregular heart beat 10/15/2016  . Suicidal ideation 10/15/2016  . Alcoholic  intoxication without complication [facility](HCC)     Past Surgical History:  Procedure Laterality Date  . c sections    . TUBAL LIGATION       OB History   No obstetric history on file.      Home Medications    Prior to Admission medications   Medication Sig Start Date End Date Taking? Authorizing Provider  ARIPiprazole (ABILIFY) 5 MG tablet Take 1 tablet (5 mg total) by mouth at bedtime. For mood Patient not taking: Reported on 02/06/2019 12/10/18   Aldean BakerSykes, Janet E, NP  ferrous sulfate 325 (65 FE) MG tablet Take 325 mg by mouth daily with breakfast.    [provider]  gabapentin (NEURONTIN) 300 MG capsule Take 1 capsule (300 mg total) by mouth 3 (three) times daily. 12/10/18   Aldean BakerSykes, Janet E, NP  hydrOXYzine (ATARAX/VISTARIL) 25 MG tablet Take 1 tablet (25 mg total) by mouth every 6 (six) hours as needed for anxiety. Patient not taking: Reported on 02/15/2019 12/10/18   Aldean BakerSykes, Janet E, NP  ibuprofen (ADVIL) 800 MG tablet Take 1 tablet (800 mg total) by mouth 3 (three) times daily as needed for headache or mild pain. 04/20/19   Jacalyn LefevreHaviland, Taeveon Keesling, MD  Multiple Vitamin (MULTIVITAMIN WITH MINERALS) TABS tablet Take 1 tablet by mouth daily. Patient not taking: Reported on 02/06/2019 12/10/18   Aldean BakerSykes, Janet E, NP  nicotine (NICODERM CQ - DOSED IN MG/24 HOURS) 21 mg/24hr  patch Place 1 patch (21 mg total) onto the skin daily. Patient not taking: Reported on 02/15/2019 12/10/18   Aldean Baker, NP  sertraline (ZOLOFT) 100 MG tablet Take 1 tablet (100 mg total) by mouth daily. For mood 12/10/18   Aldean Baker, NP  traZODone (DESYREL) 100 MG tablet Take 100 mg by mouth at bedtime.    [provider]  traZODone (DESYREL) 150 MG tablet Take 1 tablet (150 mg total) by mouth at bedtime as needed for sleep. Patient not taking: Reported on 02/06/2019 12/10/18   Aldean Baker, NP    Family History Family History  Problem Relation Age of Onset  . Hypertension Father   . Pancreatic cancer Father      Social History Social History   Tobacco Use  . Smoking status: Current Every Day Smoker    Packs/day: 1.00    Types: Cigarettes  . Smokeless tobacco: Never Used  Substance Use Topics  . Alcohol use: Not Currently    Comment: in rehab  . Drug use: Not Currently    Types: "Crack" cocaine    Comment: in rehab     Allergies   Benzoyl peroxide   Review of Systems Review of Systems  Cardiovascular: Positive for leg swelling.  Neurological: Positive for headaches.  All other systems reviewed and are negative.    Physical Exam Updated Vital Signs BP (!) 141/83 (BP Location: Right Arm)   Pulse 67   Temp 98.1 F (36.7 C) (Oral)   Resp 16   Ht 5\' 1"  (1.549 m)   Wt 122.5 kg   LMP 04/08/2019   SpO2 98%   BMI 51.02 kg/m   Physical Exam Vitals signs and nursing note reviewed.  Constitutional:      Appearance: She is well-developed. She is obese.  HENT:     Head: Normocephalic and atraumatic.  Eyes:     Extraocular Movements: Extraocular movements intact.     Pupils: Pupils are equal, round, and reactive to light.  Neck:     Musculoskeletal: Normal range of motion and neck supple.  Cardiovascular:     Rate and Rhythm: Normal rate and regular rhythm.  Pulmonary:     Effort: Pulmonary effort is normal.     Breath sounds: Normal breath sounds.  Abdominal:     General: Bowel sounds are normal.     Palpations: Abdomen is soft.  Musculoskeletal: Normal range of motion.     Right lower leg: 1+ Edema present.     Left lower leg: 1+ Edema present.  Skin:    General: Skin is warm and dry.     Capillary Refill: Capillary refill takes less than 2 seconds.  Neurological:     Mental Status: She is alert and oriented to person, place, and time.  Psychiatric:        Mood and Affect: Mood normal.        Speech: Speech normal.        Behavior: Behavior normal.      ED Treatments / Results  Labs (all labs ordered are listed, but only abnormal results are displayed)  Labs Reviewed  COMPREHENSIVE METABOLIC PANEL  CBC WITH DIFFERENTIAL/PLATELET  URINALYSIS, ROUTINE W REFLEX MICROSCOPIC  PREGNANCY, URINE  RAPID URINE DRUG SCREEN, HOSP PERFORMED  ETHANOL  TSH    EKG None  Radiology No results found.  Procedures Procedures (including critical care time)  Medications Ordered in ED Medications  sodium chloride 0.9 % bolus 1,000 mL (1,000 mLs Intravenous  New Bag/Given 04/20/19 1156)    And  0.9 %  sodium chloride infusion ( Intravenous New Bag/Given 04/20/19 1202)  ketorolac (TORADOL) 15 MG/ML injection 15 mg (15 mg Intravenous Given 04/20/19 1158)  metoCLOPramide (REGLAN) injection 10 mg (10 mg Intravenous Given 04/20/19 1158)  diphenhydrAMINE (BENADRYL) injection 12.5 mg (12.5 mg Intravenous Given 04/20/19 1156)     Initial Impression / Assessment and Plan / ED Course  I have reviewed the triage vital signs and the nursing notes.  Pertinent labs & imaging results that were available during my care of the patient were reviewed by me and considered in my medical decision making (see chart for details).        Pt is feeling much better after headache cocktail.  She has some trace edema to her legs, so I added on a TSH which goes to [facility] and takes hrs to come back, so it is not back at d/c.  Other labs ok.   She is told to f/u with her pcp and to return if worse.  Final Clinical Impressions(s) / ED Diagnoses   Final diagnoses:  Acute nonintractable headache, unspecified headache type  Pedal edema    ED Discharge Orders         Ordered    ibuprofen (ADVIL) 800 MG tablet  3 times daily PRN     04/20/19 1314           Isla Pence, MD 04/20/19 1315

## 2019-06-11 IMAGING — CR DG KNEE COMPLETE 4+V*R*
4 series · 4 of 4 positions shown · non-contrast
Comparison: None.

CLINICAL DATA: 30 y/o F; right medial knee pain. Heard a pop
walking today.

EXAM:
RIGHT KNEE - COMPLETE 4+ VIEW

[x knee ap right]
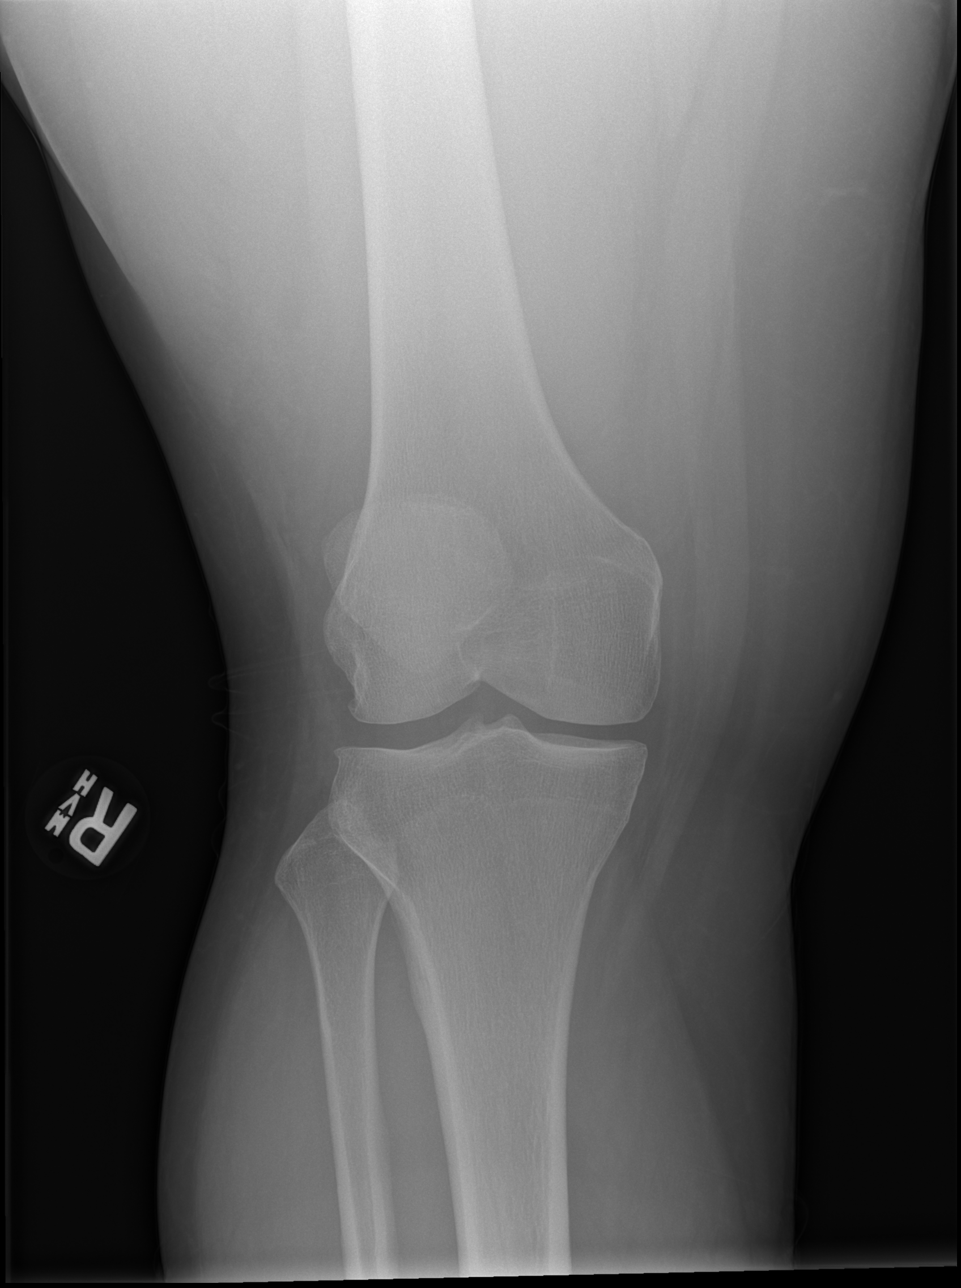

[x knee obl right (1 of 2)]
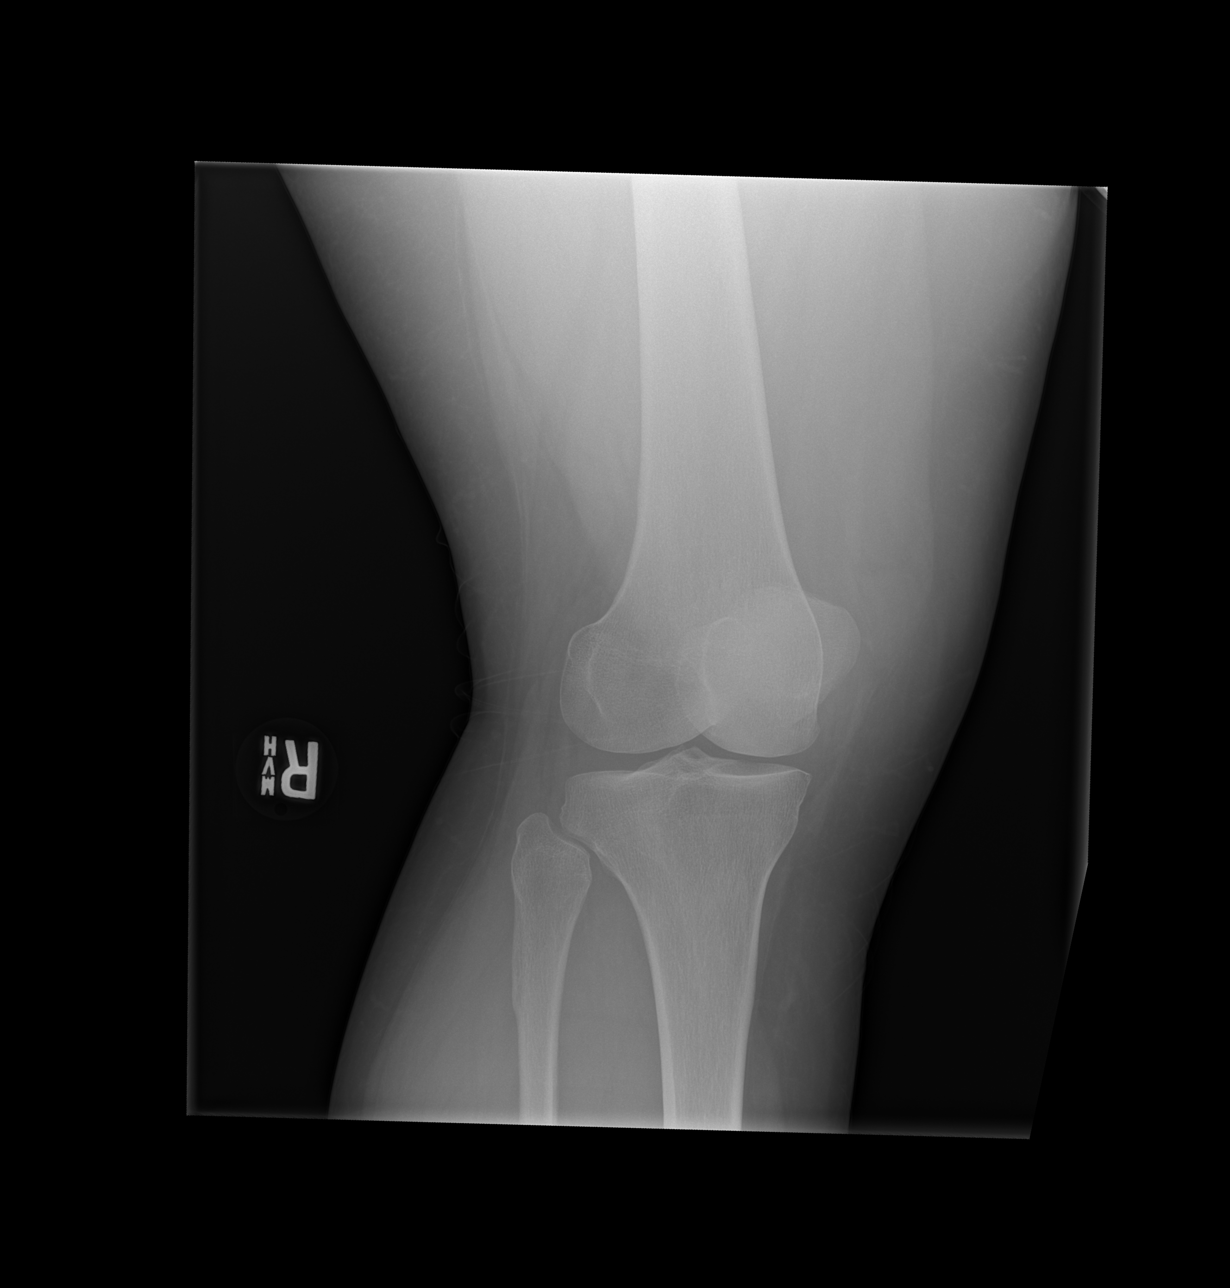

[x knee obl right (2 of 2)]
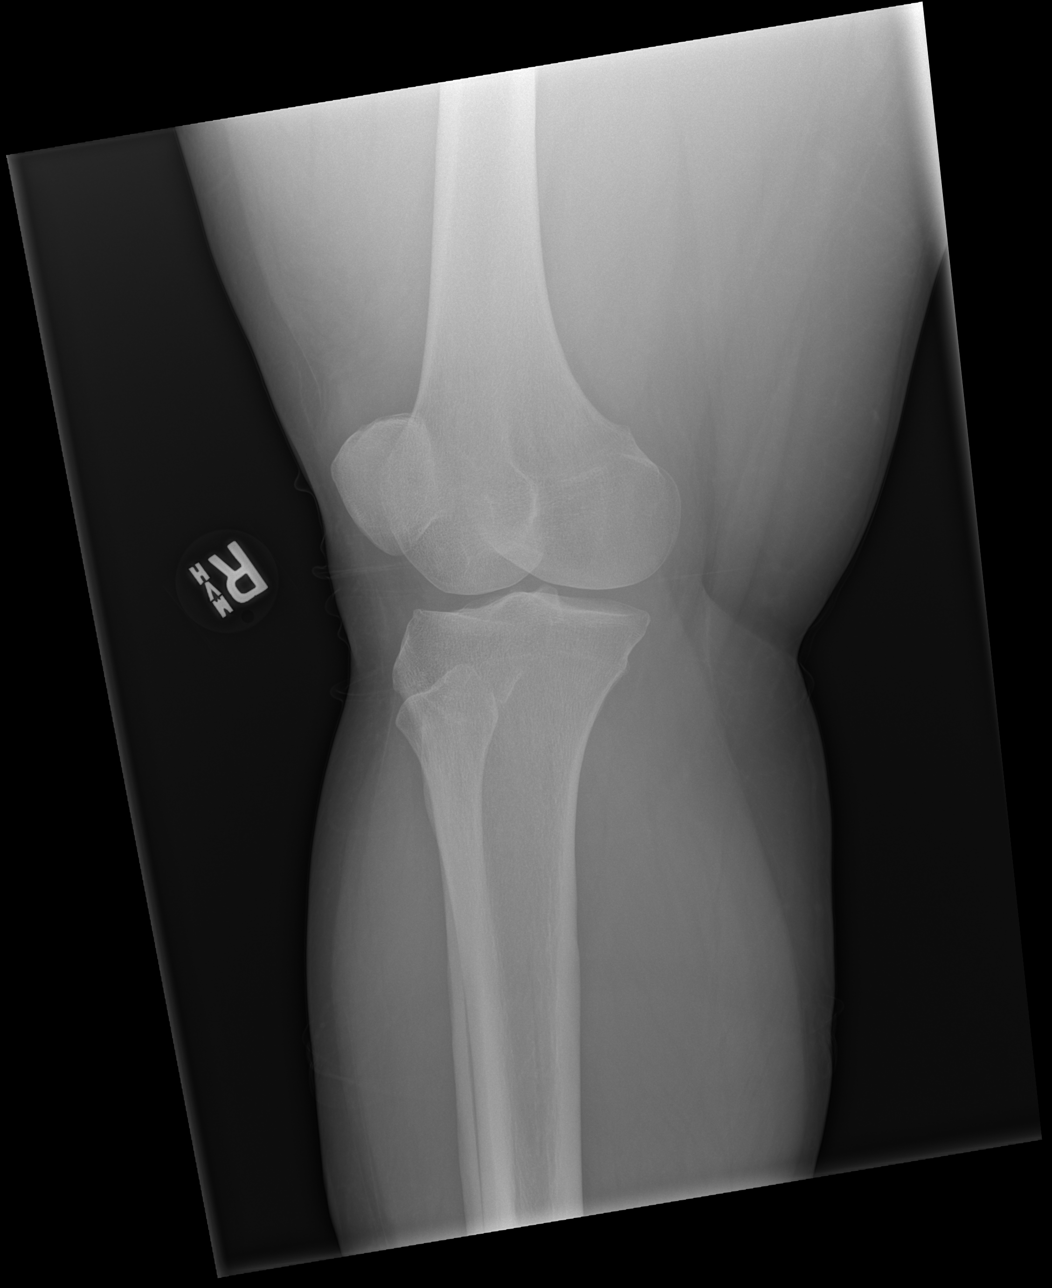

[x knee lat right]
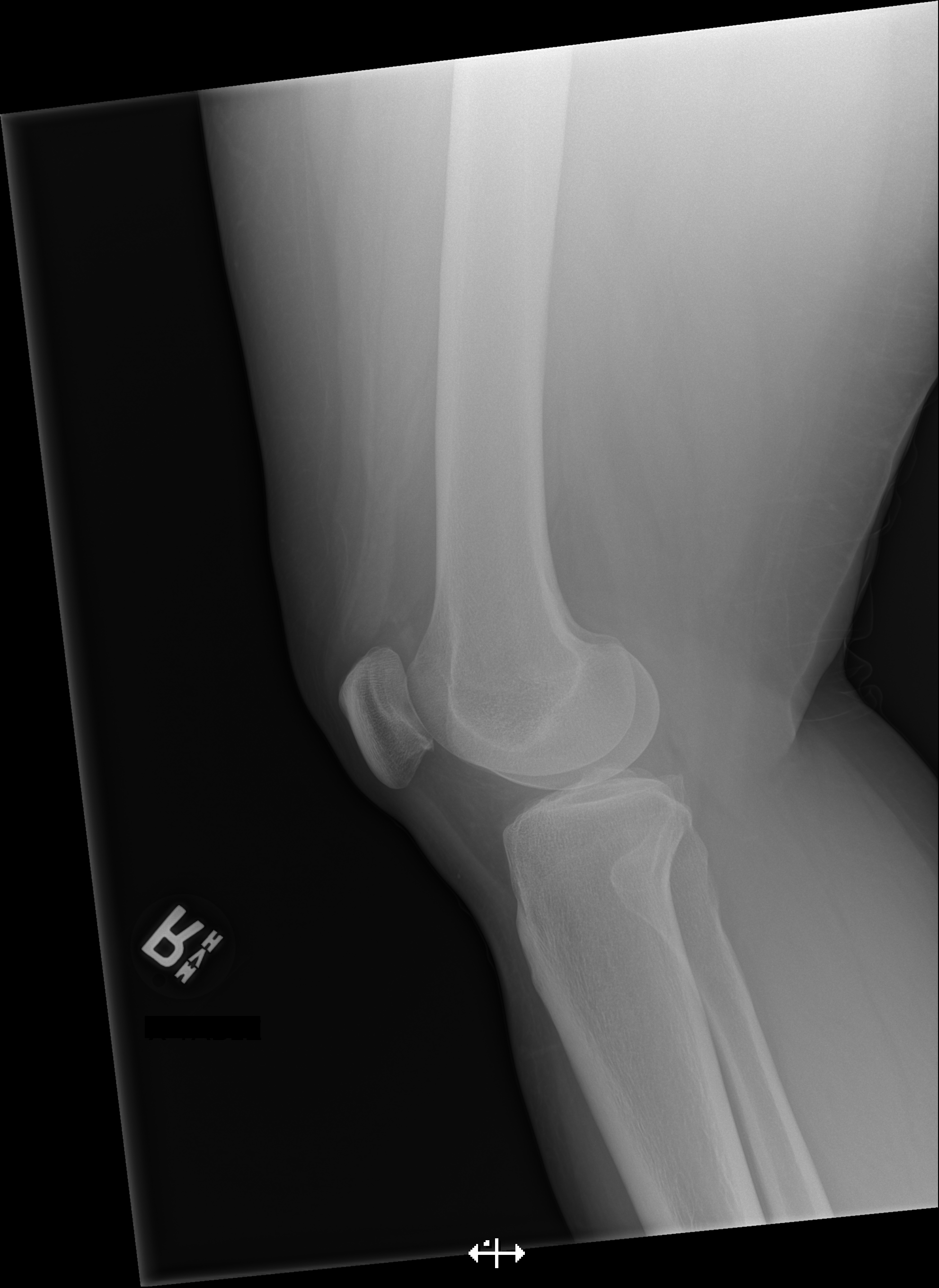

[4 of 4 positions shown; findings below may reference images not displayed]

FINDINGS: No evidence of fracture, dislocation, or joint effusion. No evidence
of arthropathy or other focal bone abnormality. Soft tissues are
unremarkable.
IMPRESSION: Negative.

By: Seminarski Etemi M.D.

## 2019-07-05 ENCOUNTER — Encounter (HOSPITAL_BASED_OUTPATIENT_CLINIC_OR_DEPARTMENT_OTHER): Payer: Self-pay | Admitting: *Deleted

## 2019-07-05 ENCOUNTER — Other Ambulatory Visit: Payer: Self-pay

## 2019-07-05 ENCOUNTER — Emergency Department (HOSPITAL_BASED_OUTPATIENT_CLINIC_OR_DEPARTMENT_OTHER): Payer: Self-pay

## 2019-07-05 ENCOUNTER — Emergency Department (HOSPITAL_BASED_OUTPATIENT_CLINIC_OR_DEPARTMENT_OTHER)
Admission: EM | Admit: 2019-07-05 | Discharge: 2019-07-05 | Disposition: A | Discharge status: Home / Self Care | Payer: Self-pay | Attending: Emergency Medicine | Admitting: Emergency Medicine

## 2019-07-05 DIAGNOSIS — Z79899 Other long term (current) drug therapy: Secondary | ICD-10-CM | POA: Insufficient documentation

## 2019-07-05 DIAGNOSIS — Z888 Allergy status to other drugs, medicaments and biological substances status: Secondary | ICD-10-CM | POA: Insufficient documentation

## 2019-07-05 DIAGNOSIS — K7150 Toxic liver disease with chronic active hepatitis without ascites: Secondary | ICD-10-CM | POA: Insufficient documentation

## 2019-07-05 DIAGNOSIS — R197 Diarrhea, unspecified: Secondary | ICD-10-CM | POA: Insufficient documentation

## 2019-07-05 DIAGNOSIS — R1011 Right upper quadrant pain: Secondary | ICD-10-CM | POA: Insufficient documentation

## 2019-07-05 DIAGNOSIS — R1013 Epigastric pain: Secondary | ICD-10-CM | POA: Insufficient documentation

## 2019-07-05 DIAGNOSIS — I10 Essential (primary) hypertension: Secondary | ICD-10-CM | POA: Insufficient documentation

## 2019-07-05 DIAGNOSIS — N899 Noninflammatory disorder of vagina, unspecified: Secondary | ICD-10-CM | POA: Insufficient documentation

## 2019-07-05 DIAGNOSIS — F17210 Nicotine dependence, cigarettes, uncomplicated: Secondary | ICD-10-CM | POA: Insufficient documentation

## 2019-07-05 LAB — WET PREP, GENITAL
Clue Cells Wet Prep HPF POC: NONE SEEN
Sperm: NONE SEEN
Trich, Wet Prep: NONE SEEN
Yeast Wet Prep HPF POC: NONE SEEN

## 2019-07-05 LAB — COMPREHENSIVE METABOLIC PANEL
ALT: 22 U/L (ref 0–44)
AST: 19 U/L (ref 15–41)
Albumin: 3.6 g/dL (ref 3.5–5.0)
Alkaline Phosphatase: 38 U/L (ref 38–126)
Anion gap: 8 (ref 5–15)
BUN: 17 mg/dL (ref 6–20)
CO2: 24 mmol/L (ref 22–32)
Calcium: 8.6 mg/dL — ABNORMAL LOW (ref 8.9–10.3)
Chloride: 106 mmol/L (ref 98–111)
Creatinine, Ser: 0.7 mg/dL (ref 0.44–1.00)
GFR calc Af Amer: >60 mL/min (ref >60)
GFR calc non Af Amer: >60 mL/min (ref >60)
Glucose, Bld: 81 mg/dL (ref 70–99)
Potassium: 3.8 mmol/L (ref 3.5–5.1)
Sodium: 138 mmol/L (ref 135–145)
Total Bilirubin: 0.3 mg/dL (ref 0.3–1.2)
Total Protein: 6.7 g/dL (ref 6.5–8.1)

## 2019-07-05 LAB — CBC WITH DIFFERENTIAL/PLATELET
Abs Immature Granulocytes: 0.01 10*3/uL (ref 0.00–0.07)
Basophils Absolute: 0 10*3/uL (ref 0.0–0.1)
Basophils Relative: 0 %
Eosinophils Absolute: 0.2 10*3/uL (ref 0.0–0.5)
Eosinophils Relative: 3 %
HCT: 37.9 % (ref 36.0–46.0)
Hemoglobin: 12.2 g/dL (ref 12.0–15.0)
Immature Granulocytes: 0 %
Lymphocytes Relative: 21 %
Lymphs Abs: 1.3 10*3/uL (ref 0.7–4.0)
MCH: 28.4 pg (ref 26.0–34.0)
MCHC: 32.2 g/dL (ref 30.0–36.0)
MCV: 88.1 fL (ref 80.0–100.0)
Monocytes Absolute: 0.6 10*3/uL (ref 0.1–1.0)
Monocytes Relative: 10 %
Neutro Abs: 4 10*3/uL (ref 1.7–7.7)
Neutrophils Relative %: 66 %
Platelets: 241 10*3/uL (ref 150–400)
RBC: 4.3 MIL/uL (ref 3.87–5.11)
RDW: 13.3 % (ref 11.5–15.5)
WBC: 6.1 10*3/uL (ref 4.0–10.5)
nRBC: 0 % (ref 0.0–0.2)

## 2019-07-05 LAB — URINALYSIS, ROUTINE W REFLEX MICROSCOPIC
Bilirubin Urine: NEGATIVE
Glucose, UA: NEGATIVE mg/dL
Hgb urine dipstick: NEGATIVE
Ketones, ur: NEGATIVE mg/dL
Leukocytes,Ua: NEGATIVE
Nitrite: NEGATIVE
Protein, ur: NEGATIVE mg/dL
Specific Gravity, Urine: >1.03 — ABNORMAL HIGH (ref 1.005–1.030)
pH: 6 (ref 5.0–8.0)

## 2019-07-05 LAB — PREGNANCY, URINE: Preg Test, Ur: NEGATIVE

## 2019-07-05 LAB — MONONUCLEOSIS SCREEN: Mono Screen: NEGATIVE

## 2019-07-05 LAB — LIPASE, BLOOD: Lipase: 30 U/L (ref 11–51)

## 2019-07-05 MED ORDER — PANTOPRAZOLE SODIUM 20 MG PO TBEC: 20.0000 mg | DELAYED_RELEASE_TABLET | Freq: Every day | ORAL | 0 refills | Status: AC

## 2019-07-05 MED ORDER — IOHEXOL 300 MG/ML  SOLN
100.0000 mL | Freq: Once | INTRAMUSCULAR | Status: AC | PRN
  Administered 2019-07-05: 17:00:00 100 mL via INTRAVENOUS

## 2019-07-05 MED ORDER — SODIUM CHLORIDE 0.9 % IV BOLUS
500.0000 mL | Freq: Once | INTRAVENOUS | Status: AC
  Administered 2019-07-05: 17:00:00 500 mL via INTRAVENOUS

## 2019-07-05 MED ORDER — ONDANSETRON HCL 4 MG/2ML IJ SOLN
4.0000 mg | Freq: Once | INTRAMUSCULAR | Status: AC
  Administered 2019-07-05: 15:00:00 4 mg via INTRAVENOUS
  Filled 2019-07-05: qty 2

## 2019-07-05 MED ORDER — ONDANSETRON HCL 4 MG PO TABS: 4.0000 mg | ORAL_TABLET | Freq: Four times a day (QID) | ORAL | 0 refills | Status: AC

## 2019-07-05 MED ORDER — FAMOTIDINE IN NACL 20-0.9 MG/50ML-% IV SOLN
20.0000 mg | Freq: Once | INTRAVENOUS | Status: AC
  Administered 2019-07-05: 17:00:00 20 mg via INTRAVENOUS
  Filled 2019-07-05: qty 50

## 2019-07-05 MED ORDER — KETOROLAC TROMETHAMINE 30 MG/ML IJ SOLN
30.0000 mg | Freq: Once | INTRAMUSCULAR | Status: AC
  Administered 2019-07-05: 30 mg via INTRAVENOUS
  Filled 2019-07-05: qty 1

## 2019-07-05 NOTE — ED Notes (Signed)
Pt. Reports feeling so much better at this time.

## 2019-07-05 NOTE — ED Triage Notes (Addendum)
Headache, weakness, abdominal pain and fatigue for a week. Pt states she cannot be given any narcotics per past medical history.

## 2019-07-05 NOTE — ED Provider Notes (Signed)
[facility] EMERGENCY DEPARTMENT Provider Note   CSN: [id number] Arrival date & time: 07/05/19  1352     History   Chief Complaint Chief Complaint  Patient presents with   Abdominal Pain    HPI Shirley Murphy is a 33 y.o. female with history of hypertension, hepatitis C, gallstones, anxiety, depression, polysubstance abuse in recovery who presents with right sided abdominal pain radiating to the back.  She has had associated nausea, but no vomiting.  She has had a few episodes of nonbloody diarrhea.  Patient's pain is mostly in the right upper quadrant and epigastrium.  Patient reports this feels similar to "having problems with her liver in the past.  "She has taken ibuprofen at home without significant relief.  Patient denies any fevers, chest pain, shortness of breath, cough.  Patient has had some pressure with urination, but denies any dysuria or frequency.  She reports a small amount of white vaginal discharge, but this is not necessarily new for her. Patient requests no narcotics as she is in recovery for polysubstance abuse.     HPI  Past Medical History:  Diagnosis Date   Anemia    Anxiety    Chest pain    Depression    Gallstones    Hepatitis C    Hypertension    patient reports only during detox   Irregular heart beat     Patient Active Problem List   Diagnosis Date Noted   MDD (major depressive disorder) 02/07/2019   Polysubstance abuse ([place]) 12/10/2018   MDD (major depressive disorder), recurrent, severe, with psychosis ([place]) 12/05/2018   Acute hepatitis 08/13/2018   Nausea & vomiting 08/13/2018   Overdose 02/28/2018   Drug overdose 02/28/2018   Severe recurrent major depression without psychotic features ([place]) 02/24/2018   Chronic hepatitis C without hepatic coma ([place]) 02/23/2018   Alcohol dependence with alcohol-induced mood disorder ([place]) 02/02/2018   MDD (major depressive disorder), recurrent severe, without psychosis  ([place]) 02/01/2018   Moderate cocaine use disorder ([place]) 06/12/2017   Alcohol abuse with alcohol-induced mood disorder ([place]) 06/12/2017   Substance induced mood disorder ([place]) 03/04/2017   MDD (major depressive disorder), recurrent episode ([place]) 03/02/2017   Suicide attempt ([place]) 10/15/2016   HTN (hypertension), benign 10/15/2016   Irregular heart beat 10/15/2016   Suicidal ideation 18/84/1660   Alcoholic intoxication without complication [facility])     Past Surgical History:  Procedure Laterality Date   c sections     TUBAL LIGATION       OB History   No obstetric history on file.      Home Medications    Prior to Admission medications   Medication Sig Start Date End Date Taking? Authorizing Provider  ARIPiprazole (ABILIFY) 5 MG tablet Take 1 tablet (5 mg total) by mouth at bedtime. For mood Patient not taking: Reported on 02/06/2019 12/10/18   Connye Burkitt, NP  ferrous sulfate 325 (65 FE) MG tablet Take 325 mg by mouth daily with breakfast.    [provider]  gabapentin (NEURONTIN) 300 MG capsule Take 1 capsule (300 mg total) by mouth 3 (three) times daily. 12/10/18   Connye Burkitt, NP  hydrOXYzine (ATARAX/VISTARIL) 25 MG tablet Take 1 tablet (25 mg total) by mouth every 6 (six) hours as needed for anxiety. Patient not taking: Reported on 02/15/2019 12/10/18   Connye Burkitt, NP  ibuprofen (ADVIL) 800 MG tablet Take 1 tablet (800 mg total) by mouth 3 (three) times daily as needed for  headache or mild pain. 04/20/19   Jacalyn LefevreHaviland, Julie, MD  Multiple Vitamin (MULTIVITAMIN WITH MINERALS) TABS tablet Take 1 tablet by mouth daily. Patient not taking: Reported on 02/06/2019 12/10/18   Aldean BakerSykes, Janet E, NP  nicotine (NICODERM CQ - DOSED IN MG/24 HOURS) 21 mg/24hr patch Place 1 patch (21 mg total) onto the skin daily. Patient not taking: Reported on 02/15/2019 12/10/18   Aldean BakerSykes, Janet E, NP  ondansetron (ZOFRAN) 4 MG tablet Take 1 tablet (4 mg total) by mouth every 6 (six) hours.  07/05/19   Filipe Greathouse, Waylan BogaAlexandra M, PA-C  pantoprazole (PROTONIX) 20 MG tablet Take 1 tablet (20 mg total) by mouth daily. 07/05/19   Jaidee Stipe, Waylan BogaAlexandra M, PA-C  sertraline (ZOLOFT) 100 MG tablet Take 1 tablet (100 mg total) by mouth daily. For mood 12/10/18   Aldean BakerSykes, Janet E, NP  traZODone (DESYREL) 100 MG tablet Take 100 mg by mouth at bedtime.    [provider]  traZODone (DESYREL) 150 MG tablet Take 1 tablet (150 mg total) by mouth at bedtime as needed for sleep. Patient not taking: Reported on 02/06/2019 12/10/18   Aldean BakerSykes, Janet E, NP    Family History Family History  Problem Relation Age of Onset   Hypertension Father    Pancreatic cancer Father     Social History Social History   Tobacco Use   Smoking status: Current Every Day Smoker    Packs/day: 1.00    Types: Cigarettes   Smokeless tobacco: Never Used  Substance Use Topics   Alcohol use: Not Currently    Comment: in rehab   Drug use: Not Currently    Types: "Crack" cocaine, Cocaine    Comment: in rehab facility for alcohol, crack and heroin     Allergies   Benzoyl peroxide   Review of Systems Review of Systems  Constitutional: Positive for fatigue. Negative for chills and fever.  HENT: Negative for facial swelling and sore throat.   Respiratory: Negative for shortness of breath.   Cardiovascular: Negative for chest pain.  Gastrointestinal: Positive for abdominal pain, diarrhea and nausea. Negative for vomiting.  Genitourinary: Negative for dysuria.  Musculoskeletal: Positive for back pain.  Skin: Negative for rash and wound.  Neurological: Positive for weakness (generalized) and headaches.  Psychiatric/Behavioral: The patient is not nervous/anxious.      Physical Exam Updated Vital Signs BP (!) 146/84    Pulse 60    Temp 98.2 F (36.8 C) (Oral)    Resp 18    Ht 5\' 1"  (1.549 m)    Wt 120.2 kg    LMP 06/19/2019    SpO2 100%    BMI 50.07 kg/m   Physical Exam Vitals signs and nursing note reviewed.    Constitutional:      General: She is not in acute distress.    Appearance: She is well-developed. She is not diaphoretic.  HENT:     Head: Normocephalic and atraumatic.     Mouth/Throat:     Pharynx: No oropharyngeal exudate.  Eyes:     General: No scleral icterus.       Right eye: No discharge.        Left eye: No discharge.     Conjunctiva/sclera: Conjunctivae normal.     Pupils: Pupils are equal, round, and reactive to light.  Neck:     Musculoskeletal: Normal range of motion and neck supple.     Thyroid: No thyromegaly.  Cardiovascular:     Rate and Rhythm: Normal rate and regular rhythm.  Heart sounds: Normal heart sounds. No murmur. No friction rub. No gallop.   Pulmonary:     Effort: Pulmonary effort is normal. No respiratory distress.     Breath sounds: Normal breath sounds. No stridor. No wheezing or rales.  Abdominal:     General: Bowel sounds are normal. There is no distension.     Palpations: Abdomen is soft.     Tenderness: There is abdominal tenderness in the right upper quadrant and epigastric area. There is no right CVA tenderness, left CVA tenderness, guarding or rebound. Positive signs include Murphy's sign.  Musculoskeletal:       Back:  Lymphadenopathy:     Cervical: No cervical adenopathy.  Skin:    General: Skin is warm and dry.     Coloration: Skin is not pale.     Findings: No rash.  Neurological:     Mental Status: She is alert.     Coordination: Coordination normal.      ED Treatments / Results  Labs (all labs ordered are listed, but only abnormal results are displayed) Labs Reviewed  WET PREP, GENITAL - Abnormal; Notable for the following components:      Result Value   WBC, Wet Prep HPF POC MODERATE (*)    All other components within normal limits  URINALYSIS, ROUTINE W REFLEX MICROSCOPIC - Abnormal; Notable for the following components:   Specific Gravity, Urine >1.030 (*)    All other components within normal limits   COMPREHENSIVE METABOLIC PANEL - Abnormal; Notable for the following components:   Calcium 8.6 (*)    All other components within normal limits  PREGNANCY, URINE  LIPASE, BLOOD  CBC WITH DIFFERENTIAL/PLATELET  MONONUCLEOSIS SCREEN  GC/CHLAMYDIA PROBE AMP ([place]) NOT AT [facility] [place]    EKG None  Radiology Ct Abdomen Pelvis W Contrast  Result Date: 07/05/2019 CLINICAL DATA:  Right-sided abdominal pain history of hep C EXAM: CT ABDOMEN AND PELVIS WITH CONTRAST TECHNIQUE: Multidetector CT imaging of the abdomen and pelvis was performed using the standard protocol following bolus administration of intravenous contrast. CONTRAST:  OMNIPAQUE IOHEXOL 300 MG/ML  SOLN COMPARISON:  Ultrasound 07/05/2019, CT 08/13/2018 FINDINGS: Lower chest: Lung bases demonstrate no acute consolidation or pleural effusion. Mild scarring in the right middle lobe. Normal heart size. Hepatobiliary: No focal liver abnormality is seen. No gallstones, gallbladder wall thickening, or biliary dilatation. Pancreas: Unremarkable. No pancreatic ductal dilatation or surrounding inflammatory changes. Spleen: Borderline to slightly enlarged, measuring up to 13 cm. Adrenals/Urinary Tract: 2.1 cm low-density left adrenal nodule, without change and likely a small adenoma. The right adrenal gland is normal. The kidneys show a punctate stone in the mid left kidney. There is no hydronephrosis. The bladder is normal Stomach/Bowel: Stomach is within normal limits. Appendix appears normal. No evidence of bowel wall thickening, distention, or inflammatory changes. Vascular/Lymphatic: No significant vascular findings are present. No enlarged abdominal or pelvic lymph nodes. Reproductive: Uterus and bilateral adnexa are unremarkable. Other: Negative for free air or free fluid. Musculoskeletal: Degenerative changes at L5-S1. No acute osseous abnormality IMPRESSION: 1. No CT evidence for acute intra-abdominal or pelvic abnormality. 2. Borderline to  slightly enlarged spleen 3. 2.1 cm left adrenal gland nodule likely an adenoma. Electronically Signed   By: Jasmine Pang M.D.   On: 07/05/2019 16:44   US Abdomen Limited Ruq  Result Date: 07/05/2019 CLINICAL DATA:  Intermittent right upper quadrant pain for 1 week EXAM: ULTRASOUND ABDOMEN LIMITED RIGHT UPPER QUADRANT COMPARISON:  Ultrasound 08/12/2018, CT abdomen pelvis  08/13/2018 FINDINGS: Gallbladder: No gallstones or wall thickening visualized. No sonographic Murphy sign noted by sonographer. Common bile duct: Diameter: 2.6 mm Liver: No focal lesion identified. Normal parenchymal echogenicity with preservation of the defined portal triads. Portal vein is patent on color Doppler imaging with normal direction of blood flow towards the liver. Other: Technically difficult exam due to patient motion artifact. IMPRESSION: Unremarkable right upper quadrant ultrasound. Electronically Signed   By: Kreg Shropshire M.D.   On: 07/05/2019 15:52    Procedures Procedures (including critical care time)  Medications Ordered in ED Medications  ondansetron (ZOFRAN) injection 4 mg (4 mg Intravenous Given 07/05/19 1523)  iohexol (OMNIPAQUE) 300 MG/ML solution 100 mL (100 mLs Intravenous Contrast Given 07/05/19 1632)  sodium chloride 0.9 % bolus 500 mL (0 mLs Intravenous Stopped 07/05/19 1825)  famotidine (PEPCID) IVPB 20 mg premix (0 mg Intravenous Stopped 07/05/19 1733)  ketorolac (TORADOL) 30 MG/ML injection 30 mg (30 mg Intravenous Given 07/05/19 1702)     Initial Impression / Assessment and Plan / ED Course  I have reviewed the triage vital signs and the nursing notes.  Pertinent labs & imaging results that were available during my care of the patient were reviewed by me and considered in my medical decision making (see chart for details).        Patient with right upper quadrant pain and low back pain.  Labs are unremarkable.  UA clear.  Urine pregnancy negative.  Monospot negative, which was  completed due to borderline splenomegaly seen on CT.  Right upper quadrant ultrasound is negative.  CT shows no acute abnormality and a left adrenal gland nodule, likely an adenoma, patient is aware.  Suspect possible passed kidney stone as patient is reporting urethral pressure despite normal urine and there is a punctate kidney stone seen on the left.  Also consider ulcer, as patient has been told this before.  Will start Protonix and Zofran.  Follow-up to GI and urology as needed.  Also follow-up to PCP.  Return precautions discussed.  Patient understands and agrees with plan.  Patient vitals stable throughout ED course and discharged in satisfactory condition.  Final Clinical Impressions(s) / ED Diagnoses   Final diagnoses:  RUQ pain    ED Discharge Orders         Ordered    pantoprazole (PROTONIX) 20 MG tablet  Daily     07/05/19 1826    ondansetron (ZOFRAN) 4 MG tablet  Every 6 hours     07/05/19 1826           Emi Holes, PA-C 07/05/19 1945    Virgina Norfolk, DO 07/06/19 1038

## 2019-07-05 NOTE — ED Notes (Signed)
[place] at bedside due to PT. C/o pain in R upper side for a week.  Pt. Reports having this pain in past with liver failure issues

## 2019-07-05 NOTE — Discharge Instructions (Signed)
It is unclear what exactly is causing your pain today.  You could have a gastric ulcer or a kidney stone.  Your CT scan did show borderline enlarged spleen.  Please make your primary care doctor aware of this.  I recommend trying to cut back on ibuprofen, as this could be contributing to your ulcer.  Please follow-up with the GI doctor, Dr. Therisa Doyne, for further evaluation and treatment of your abdominal pain and possible ulcer.  Please follow-up with Dr. Gloriann Loan with urology for further evaluation and treatment of kidney stones.  Please return the emergency department develop any new or worsening symptoms.

## 2019-07-07 LAB — GC/CHLAMYDIA PROBE AMP (~~LOC~~) NOT AT ARMC
Chlamydia: NEGATIVE
Neisseria Gonorrhea: NEGATIVE

## 2020-03-22 ENCOUNTER — Other Ambulatory Visit: Payer: Self-pay

## 2020-03-22 ENCOUNTER — Encounter (HOSPITAL_BASED_OUTPATIENT_CLINIC_OR_DEPARTMENT_OTHER): Payer: Self-pay | Admitting: *Deleted

## 2020-03-22 ENCOUNTER — Emergency Department (HOSPITAL_BASED_OUTPATIENT_CLINIC_OR_DEPARTMENT_OTHER)
Admission: EM | Admit: 2020-03-22 | Discharge: 2020-03-22 | Disposition: A | Discharge status: Home / Self Care | Payer: HRSA Program | Attending: Emergency Medicine | Admitting: Emergency Medicine

## 2020-03-22 DIAGNOSIS — M791 Myalgia, unspecified site: Secondary | ICD-10-CM | POA: Diagnosis present

## 2020-03-22 DIAGNOSIS — I10 Essential (primary) hypertension: Secondary | ICD-10-CM | POA: Diagnosis not present

## 2020-03-22 DIAGNOSIS — B349 Viral infection, unspecified: Secondary | ICD-10-CM

## 2020-03-22 DIAGNOSIS — F17210 Nicotine dependence, cigarettes, uncomplicated: Secondary | ICD-10-CM | POA: Insufficient documentation

## 2020-03-22 DIAGNOSIS — Z20822 Contact with and (suspected) exposure to covid-19: Secondary | ICD-10-CM | POA: Diagnosis not present

## 2020-03-22 LAB — SARS CORONAVIRUS 2 BY RT PCR (HOSPITAL ORDER, PERFORMED IN ~~LOC~~ HOSPITAL LAB): SARS Coronavirus 2: NEGATIVE

## 2020-03-22 MED ORDER — ONDANSETRON HCL 4 MG PO TABS: 4.0000 mg | ORAL_TABLET | Freq: Three times a day (TID) | ORAL | 0 refills | Status: AC | PRN

## 2020-03-22 NOTE — ED Provider Notes (Signed)
[facility] EMERGENCY DEPARTMENT Provider Note   CSN: [id number] Arrival date & time: 03/22/20  1316     History Chief Complaint  Patient presents with  . Covid Exposure    Shirley Murphy is a 34 y.o. female who has a past medical history of hep C, previous polysubstance abuse, recurrent episodes of major depression who is a daily smoker who presents emergency department with symptoms of viral-like illness.  Patient states that she has a housemate who is currently infected with coronavirus.  Over the past 2 days she has had body aches, fatigue, mild nausea.  Taking ibuprofen without significant improvement, vomiting, diarrhea, chills.  She has a mild cough.  HPI     Past Medical History:  Diagnosis Date  . Anemia   . Anxiety   . Chest pain   . Depression   . Gallstones   . Hepatitis C   . Hypertension    patient reports only during detox  . Irregular heart beat     Patient Active Problem List   Diagnosis Date Noted  . MDD (major depressive disorder) 02/07/2019  . Polysubstance abuse (HCC) 12/10/2018  . 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 Acute hepatitis 08/13/2018  . Nausea & vomiting 08/13/2018  . Overdose 02/28/2018  . Drug overdose 02/28/2018  . Severe recurrent major depression without psychotic features (HCC) 02/24/2018  . Chronic hepatitis C without hepatic coma (HCC) 02/23/2018  . Alcohol dependence with alcohol-induced mood disorder (HCC) 02/02/2018  . MDD (major depressive disorder), recurrent severe, without psychosis (HCC) 02/01/2018  . Moderate cocaine use disorder (HCC) 06/12/2017  . Alcohol abuse with alcohol-induced mood disorder (HCC) 06/12/2017  . Substance induced mood disorder (HCC) 03/04/2017  . MDD (major depressive disorder), recurrent episode (HCC) 03/02/2017  . Suicide attempt (HCC) 10/15/2016  . HTN (hypertension), benign 10/15/2016  . Irregular heart beat 10/15/2016  . Suicidal  ideation 10/15/2016  . Alcoholic intoxication without complication [facility])     Past Surgical History:  Procedure Laterality Date  . c sections    . TUBAL LIGATION       OB History   No obstetric history on file.     Family History  Problem Relation Age of Onset  . Hypertension Father   . Pancreatic cancer Father     Social History   Tobacco Use  . Smoking status: Current Every Day Smoker    Packs/day: 1.00    Types: Cigarettes  . Smokeless tobacco: Never Used  Vaping Use  . Vaping Use: Never used  Substance Use Topics  . Alcohol use: Not Currently    Comment: in rehab  . Drug use: Not Currently    Types: "Crack" cocaine, Cocaine    Comment: in rehab facility for alcohol, crack and heroin    Home Medications Prior to Admission medications   Medication Sig Start Date End Date Taking? Authorizing Provider  gabapentin (NEURONTIN) 300 MG capsule Take 1 capsule (300 mg total) by mouth 3 (three) times daily. 12/10/18  Yes Aldean Baker, NP  hydrOXYzine (ATARAX/VISTARIL) 25 MG tablet Take 1 tablet (25 mg total) by mouth every 6 (six) hours as needed for anxiety. 12/10/18  Yes Aldean Baker, NP  ibuprofen (ADVIL) 800 MG tablet Take 1 tablet (800 mg total) by mouth 3 (three) times daily as needed for headache or mild pain. 04/20/19  Yes Jacalyn Lefevre, MD  metoprolol tartrate (LOPRESSOR) 25 MG tablet Take by mouth.   Yes [provider]  ondansetron (ZOFRAN) 4 MG tablet Take 1 tablet (4 mg total) by mouth every 6 (six) hours. 07/05/19  Yes Law, Alexandra M, PA-C  pantoprazole (PROTONIX) 20 MG tablet Take 1 tablet (20 mg total) by mouth daily. 07/05/19  Yes Law, Alexandra M, PA-C  sertraline (ZOLOFT) 100 MG tablet Take 1 tablet (100 mg total) by mouth daily. For mood 12/10/18  Yes Aldean Baker, NP  ARIPiprazole (ABILIFY) 5 MG tablet Take 1 tablet (5 mg total) by mouth at bedtime. For mood Patient not taking: Reported on 02/06/2019 12/10/18   Aldean Baker, NP  ferrous  sulfate 325 (65 FE) MG tablet Take 325 mg by mouth daily with breakfast.    [provider]  Multiple Vitamin (MULTIVITAMIN WITH MINERALS) TABS tablet Take 1 tablet by mouth daily. Patient not taking: Reported on 02/06/2019 12/10/18   Aldean Baker, NP  nicotine (NICODERM CQ - DOSED IN MG/24 HOURS) 21 mg/24hr patch Place 1 patch (21 mg total) onto the skin daily. Patient not taking: Reported on 02/15/2019 12/10/18   Aldean Baker, NP  traZODone (DESYREL) 100 MG tablet Take 100 mg by mouth at bedtime.    [provider]  traZODone (DESYREL) 150 MG tablet Take 1 tablet (150 mg total) by mouth at bedtime as needed for sleep. Patient not taking: Reported on 02/06/2019 12/10/18   Aldean Baker, NP    Allergies    Benzoyl peroxide  Review of Systems   Review of Systems Ten systems reviewed and are negative for acute change, except as noted in the HPI.   Physical Exam Updated Vital Signs BP (!) 146/104   Pulse 74   Temp 98 F (36.7 C) (Oral)   Resp 20   Ht 5\' 1"  (1.549 m)   Wt 120.2 kg   LMP 03/15/2020   SpO2 98%   BMI 50.07 kg/m   Physical Exam Vitals and nursing note reviewed.  Constitutional:      General: She is not in acute distress.    Appearance: She is well-developed. She is not diaphoretic.  HENT:     Head: Normocephalic and atraumatic.  Eyes:     General: No scleral icterus.    Conjunctiva/sclera: Conjunctivae normal.  Cardiovascular:     Rate and Rhythm: Normal rate and regular rhythm.     Heart sounds: Normal heart sounds. No murmur heard.  No friction rub. No gallop.   Pulmonary:     Effort: Pulmonary effort is normal. No respiratory distress.     Breath sounds: Normal breath sounds.  Abdominal:     General: Bowel sounds are normal. There is no distension.     Palpations: Abdomen is soft. There is no mass.     Tenderness: There is no abdominal tenderness. There is no guarding.  Musculoskeletal:     Cervical back: Normal range of motion.    Skin:    General: Skin is warm and dry.  Neurological:     Mental Status: She is alert and oriented to person, place, and time.  Psychiatric:        Behavior: Behavior normal.     ED Results / Procedures / Treatments   Labs (all labs ordered are listed, but only abnormal results are displayed) Labs Reviewed  SARS CORONAVIRUS 2 BY RT PCR (HOSPITAL ORDER, PERFORMED IN [facility])    EKG None  Radiology No results found.  Procedures Procedures (including critical care time)  Medications Ordered in ED  Medications - No data to display  ED Course  I have reviewed the triage vital signs and the nursing notes.  Pertinent labs & imaging results that were available during my care of the patient were reviewed by me and considered in my medical decision making (see chart for details).    MDM Rules/Calculators/A&P                          Patient here with mild viral symptoms.  I have low suspicion for exacerbation of her hep C.  She is not having any active vomiting.  Afebrile and hemodynamically stable.  She does have elevated blood pressure.  Covid test is negative.  Will discharge with Zofran.  She may continue to treat with ibuprofen.  Discussed return precautions.  Patient given handout on home isolation and self protection given that she is currently living with somewhat is infected with Covid virus.  Shirley Murphy was evaluated in Emergency Department on 03/22/2020 for the symptoms described in the history of present illness. She was evaluated in the context of the global COVID-19 pandemic, which necessitated consideration that the patient might be at risk for infection with the SARS-CoV-2 virus that causes COVID-19. Institutional protocols and algorithms that pertain to the evaluation of patients at risk for COVID-19 are in a state of rapid change based on information released by regulatory bodies including the CDC and federal and state organizations. These  policies and algorithms were followed during the patient's care in the ED.  Final Clinical Impression(s) / ED Diagnoses Final diagnoses:  None    Rx / DC Orders ED Discharge Orders    None       Arthor Captain, PA-C 03/22/20 1604    Pricilla Loveless, MD 03/22/20 [phone]

## 2020-03-22 NOTE — ED Triage Notes (Signed)
She lives with someone that is Covid Positive. Pt is here with nausea, cough, body aches, fatigue x 2 days.

## 2020-03-22 NOTE — Discharge Instructions (Addendum)
Your COVID test has resulted and you DO NOT have coronavirus. Please make sure to stay distanced and isolated from your housemate who is infected. Take motrin or tylenol for pain.  Contact a health care provider if: You have symptoms of a viral illness that do not go away. Your symptoms come back after going away. Your symptoms get worse. Get help right away if: You have trouble breathing. You have a severe headache or a stiff neck. You have severe vomiting or abdominal pain.

## 2020-05-05 ENCOUNTER — Encounter (HOSPITAL_BASED_OUTPATIENT_CLINIC_OR_DEPARTMENT_OTHER): Payer: Self-pay

## 2020-05-05 ENCOUNTER — Emergency Department (HOSPITAL_BASED_OUTPATIENT_CLINIC_OR_DEPARTMENT_OTHER)
Admission: EM | Admit: 2020-05-05 | Discharge: 2020-05-05 | Disposition: A | Discharge status: Home / Self Care | Payer: HRSA Program | Attending: Emergency Medicine | Admitting: Emergency Medicine

## 2020-05-05 ENCOUNTER — Emergency Department (HOSPITAL_BASED_OUTPATIENT_CLINIC_OR_DEPARTMENT_OTHER): Payer: HRSA Program

## 2020-05-05 ENCOUNTER — Other Ambulatory Visit: Payer: Self-pay

## 2020-05-05 DIAGNOSIS — Z87891 Personal history of nicotine dependence: Secondary | ICD-10-CM | POA: Diagnosis not present

## 2020-05-05 DIAGNOSIS — I10 Essential (primary) hypertension: Secondary | ICD-10-CM | POA: Insufficient documentation

## 2020-05-05 DIAGNOSIS — U071 COVID-19: Secondary | ICD-10-CM | POA: Diagnosis not present

## 2020-05-05 DIAGNOSIS — R0602 Shortness of breath: Secondary | ICD-10-CM | POA: Diagnosis present

## 2020-05-05 LAB — SARS CORONAVIRUS 2 BY RT PCR (HOSPITAL ORDER, PERFORMED IN ~~LOC~~ HOSPITAL LAB): SARS Coronavirus 2: POSITIVE — AB

## 2020-05-05 MED ORDER — ALBUTEROL SULFATE HFA 108 (90 BASE) MCG/ACT IN AERS
2.0000 | INHALATION_SPRAY | Freq: Four times a day (QID) | RESPIRATORY_TRACT | Status: DC | PRN
  Administered 2020-05-05: 2 via RESPIRATORY_TRACT
  Filled 2020-05-05: qty 6.7

## 2020-05-05 MED ORDER — ONDANSETRON 4 MG PO TBDP: 4.0000 mg | ORAL_TABLET | Freq: Three times a day (TID) | ORAL | 0 refills | Status: AC | PRN

## 2020-05-05 NOTE — ED Notes (Signed)
Covid Tested via Drive Through service, told today has Positive Results,  Did not rec Vaccines. Has c/o dyspnea on exertion.

## 2020-05-05 NOTE — Discharge Instructions (Signed)
You were seen in the emergency department today with COVID-19 symptoms.  I have called to the infusion clinic and they will be calling you regarding setting up a monoclonal antibody infusion early next week.  Please answer your phone if you get a call from an unknown number.  You need to remain in quarantine and use the medications provided as needed.  If you develop shortness of breath, chest pain, other sudden severe symptoms please return to the emergency department.

## 2020-05-05 NOTE — ED Triage Notes (Signed)
All previous charting done by Emmarae Cowdery RN  

## 2020-05-05 NOTE — ED Provider Notes (Signed)
Emergency Department Provider Note   I have reviewed the triage vital signs and the nursing notes.   HISTORY  Chief Complaint Shortness of Breath and Fever   HPI Shirley Murphy is a 34 y.o. female with PMH reviewed below presents to the ED with 4 days of cough, fatigue, nausea, and diarrhea.  Patient had an outpatient test at the drive-through testing site in [facility] yesterday which during her ED visit came back positive.  Her Covid PCR test here also has come back positive.  She is not having any shortness of breath at rest with some mild shortness of breath with exertion.  No chest discomfort.  She is feeling nauseated and like her appetite is poor.  No lightheadedness or passing out.  She has had intermittent fevers.  No radiation of symptoms or other modifying factors.  Past Medical History:  Diagnosis Date  . Anemia   . Anxiety   . Chest pain   . Depression   . Gallstones   . Hepatitis C   . Hypertension    patient reports only during detox  . Irregular heart beat     Patient Active Problem List   Diagnosis Date Noted  . MDD (major depressive disorder) 02/07/2019  . Polysubstance abuse (HCC) 12/10/2018  . 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 Acute hepatitis 08/13/2018  . Nausea & vomiting 08/13/2018  . Overdose 02/28/2018  . Drug overdose 02/28/2018  . Severe recurrent major depression without psychotic features (HCC) 02/24/2018  . Chronic hepatitis C without hepatic coma (HCC) 02/23/2018  . Alcohol dependence with alcohol-induced mood disorder (HCC) 02/02/2018  . MDD (major depressive disorder), recurrent severe, without psychosis (HCC) 02/01/2018  . Moderate cocaine use disorder (HCC) 06/12/2017  . Alcohol abuse with alcohol-induced mood disorder (HCC) 06/12/2017  . Substance induced mood disorder (HCC) 03/04/2017  . MDD (major depressive disorder), recurrent episode (HCC) 03/02/2017  . Suicide attempt  (HCC) 10/15/2016  . HTN (hypertension), benign 10/15/2016  . Irregular heart beat 10/15/2016  . Suicidal ideation 10/15/2016  . Alcoholic intoxication without complication [facility])     Past Surgical History:  Procedure Laterality Date  . c sections    . TUBAL LIGATION      Allergies Benzoyl peroxide  Family History  Problem Relation Age of Onset  . Hypertension Father   . Pancreatic cancer Father     Social History Social History   Tobacco Use  . Smoking status: Former Smoker    Packs/day: 1.00    Types: Cigarettes  . Smokeless tobacco: Never Used  Vaping Use  . Vaping Use: Every day  Substance Use Topics  . Alcohol use: Not Currently    Comment: in rehab  . Drug use: Not Currently    Types: "Crack" cocaine, Cocaine    Comment: in rehab facility for alcohol, crack and heroin    Review of Systems  Constitutional: Positive fever/chills Eyes: No visual changes. ENT: No sore throat. Positive congestion.  Cardiovascular: Denies chest pain. Respiratory: Denies shortness of breath. Gastrointestinal: No abdominal pain. Positive nausea, no vomiting. Mild diarrhea.  No constipation. Genitourinary: Negative for dysuria. Musculoskeletal: Negative for back pain. Skin: Negative for rash. Neurological: Negative for headaches, focal weakness or numbness.  10-point ROS otherwise negative.  ____________________________________________   PHYSICAL EXAM:  VITAL SIGNS: ED Triage Vitals [05/05/20 1502]  Enc Vitals Group     BP (!) 164/101     Pulse Rate 86     Resp (!)  26     Temp 99 F (37.2 C)     Temp Source Oral     SpO2 98 %     Weight 290 lb (131.5 kg)     Height 5\' 1"  (1.549 m)   Constitutional: Alert and oriented. Well appearing and in no acute distress. Eyes: Conjunctivae are normal. Head: Atraumatic. Nose: No congestion/rhinnorhea. Mouth/Throat: Mucous membranes are moist.   Neck: No stridor.  Cardiovascular: Normal rate, regular rhythm. Good peripheral  circulation. Grossly normal heart sounds.   Respiratory: Normal respiratory effort.  No retractions. Lungs CTAB. Gastrointestinal: Soft and nontender. No distention.  Musculoskeletal: No gross deformities of extremities. Neurologic:  Normal speech and language.  Skin:  Skin is warm, dry and intact. No rash noted.   ____________________________________________   LABS (all labs ordered are listed, but only abnormal results are displayed)  Labs Reviewed  SARS CORONAVIRUS 2 BY RT PCR (HOSPITAL ORDER, PERFORMED IN [place] [facility]) - Abnormal; Notable for the following components:      Result Value   SARS Coronavirus 2 POSITIVE (*)    All other components within normal limits   ____________________________________________  RADIOLOGY  DG Chest Portable 1 View  Result Date: 05/05/2020 CLINICAL DATA:  Fever and shortness of breath. EXAM: PORTABLE CHEST 1 VIEW COMPARISON:  February 14, 2019 FINDINGS: Mildly increased bronchovascular lung markings are seen within the lung bases. There is no evidence of acute infiltrate, pleural effusion or pneumothorax. The heart size and mediastinal contours are within normal limits. The visualized skeletal structures are unremarkable. IMPRESSION: Mildly increased bibasilar bronchovascular lung markings without evidence of acute infiltrate. Electronically Signed   By: February 16, 2019 M.D.   On: 05/05/2020 15:32    ____________________________________________   PROCEDURES  Procedure(s) performed:   Procedures  None  ____________________________________________   INITIAL IMPRESSION / ASSESSMENT AND PLAN / ED COURSE  Pertinent labs & imaging results that were available during my care of the patient were reviewed by me and considered in my medical decision making (see chart for details).   Patient presents to the emergency department for evaluation of COVID-19-like symptoms for the past 4 days.  By protocol she does meet criteria for monoclonal  antibodies.  I discussed this with her and she will be referred to the monoclonal antibody infusion center for infusion hopefully early next week.  She is not hypoxemic or having increased work of breathing on my exam.  Lungs are clear.  Imaging and labs reviewed.  Will provide inhaler at time of discharge and send Zofran prescription to her pharmacy.  Discussed plan for quarantine and will provide work note.  Shirley Murphy was evaluated in Emergency Department on 05/05/2020 for the symptoms described in the history of present illness. She was evaluated in the context of the global COVID-19 pandemic, which necessitated consideration that the patient might be at risk for infection with the SARS-CoV-2 virus that causes COVID-19. Institutional protocols and algorithms that pertain to the evaluation of patients at risk for COVID-19 are in a state of rapid change based on information released by regulatory bodies including the CDC and federal and state organizations. These policies and algorithms were followed during the patient's care in the ED.    ____________________________________________  FINAL CLINICAL IMPRESSION(S) / ED DIAGNOSES  Final diagnoses:  COVID-19     MEDICATIONS GIVEN DURING THIS VISIT:  Medications  albuterol (VENTOLIN HFA) 108 (90 Base) MCG/ACT inhaler 2 puff (has no administration in time range)     NEW  OUTPATIENT MEDICATIONS STARTED DURING THIS VISIT:  New Prescriptions   ONDANSETRON (ZOFRAN ODT) 4 MG DISINTEGRATING TABLET    Take 1 tablet (4 mg total) by mouth every 8 (eight) hours as needed.    Note:  This document was prepared using Dragon voice recognition software and may include unintentional dictation errors.  Alona Bene, MD, [facility] Emergency Medicine    Volney Reierson, Arlyss Repress, MD 05/05/20 [phone]

## 2020-05-05 NOTE — ED Triage Notes (Signed)
Pt arrives with c/o fever, SOB, and loss of taste or smell. Pt was tested on the 16th and it was negative. Pt also c/o fatigue and congestion. Treating at home with ibuprofen and tylenol.

## 2020-05-06 ENCOUNTER — Telehealth: Payer: Self-pay | Admitting: Family

## 2020-05-06 ENCOUNTER — Other Ambulatory Visit: Payer: Self-pay | Admitting: Family

## 2020-05-06 DIAGNOSIS — U071 COVID-19: Secondary | ICD-10-CM

## 2020-05-06 NOTE — Telephone Encounter (Signed)
I connected by phone with Shirley Murphy on 05/06/2020 at 10:50 AM to discuss the potential use of a new treatment for mild to moderate COVID-19 viral infection in non-hospitalized patients.  This patient is a 34 y.o. female that meets the FDA criteria for Emergency Use Authorization of COVID monoclonal antibody casirivimab/imdevimab.  Has a (+) direct SARS-CoV-2 viral test result  Has mild or moderate COVID-19   Is NOT hospitalized due to COVID-19  Is within 10 days of symptom onset  Has at least one of the high risk factor(s) for progression to severe COVID-19 and/or hospitalization as defined in EUA.  Specific high risk criteria : BMI > 25 and Cardiovascular disease or hypertension  I have spoken and communicated the following to the patient or parent/caregiver regarding COVID monoclonal antibody treatment:  1. FDA has authorized the emergency use for the treatment of mild to moderate COVID-19 in adults and pediatric patients with positive results of direct SARS-CoV-2 viral testing who are 65 years of age and older weighing at least 40 kg, and who are at high risk for progressing to severe COVID-19 and/or hospitalization.  2. The significant known and potential risks and benefits of COVID monoclonal antibody, and the extent to which such potential risks and benefits are unknown.  3. Information on available alternative treatments and the risks and benefits of those alternatives, including clinical trials.  4. Patients treated with COVID monoclonal antibody should continue to self-isolate and use infection control measures (e.g., wear mask, isolate, social distance, avoid sharing personal items, clean and disinfect "high touch" surfaces, and frequent handwashing) according to CDC guidelines.   5. The patient or parent/caregiver has the option to accept or refuse COVID monoclonal antibody treatment.  After reviewing this information with the patient, The patient agreed to proceed with  receiving casirivimab\imdevimab infusion and will be provided a copy of the Fact sheet prior to receiving the infusion. Alver Sorrow 05/06/2020 10:50 AM

## 2020-05-07 ENCOUNTER — Ambulatory Visit (HOSPITAL_COMMUNITY)
Admission: RE | Admit: 2020-05-07 | Discharge: 2020-05-07 | Disposition: A | Discharge status: Home / Self Care | Payer: HRSA Program | Source: Ambulatory Visit | Attending: Pulmonary Disease | Admitting: Pulmonary Disease

## 2020-05-07 DIAGNOSIS — U071 COVID-19: Secondary | ICD-10-CM

## 2020-05-07 MED ORDER — FAMOTIDINE IN NACL 20-0.9 MG/50ML-% IV SOLN: 20.0000 mg | Freq: Once | INTRAVENOUS | Status: DC | PRN

## 2020-05-07 MED ORDER — DIPHENHYDRAMINE HCL 50 MG/ML IJ SOLN: 50.0000 mg | Freq: Once | INTRAMUSCULAR | Status: DC | PRN

## 2020-05-07 MED ORDER — ALBUTEROL SULFATE HFA 108 (90 BASE) MCG/ACT IN AERS: 2.0000 | INHALATION_SPRAY | Freq: Once | RESPIRATORY_TRACT | Status: DC | PRN

## 2020-05-07 MED ORDER — SODIUM CHLORIDE 0.9 % IV SOLN
1200.0000 mg | Freq: Once | INTRAVENOUS | Status: AC
  Administered 2020-05-07: 1200 mg via INTRAVENOUS

## 2020-05-07 MED ORDER — SODIUM CHLORIDE 0.9 % IV SOLN: INTRAVENOUS | Status: DC | PRN

## 2020-05-07 MED ORDER — EPINEPHRINE 0.3 MG/0.3ML IJ SOAJ: 0.3000 mg | Freq: Once | INTRAMUSCULAR | Status: DC | PRN

## 2020-05-07 MED ORDER — METHYLPREDNISOLONE SODIUM SUCC 125 MG IJ SOLR: 125.0000 mg | Freq: Once | INTRAMUSCULAR | Status: DC | PRN

## 2020-05-07 NOTE — Progress Notes (Signed)
  Diagnosis: COVID-19  Physician: Dr. Patrick Wright  Procedure: Covid Infusion Clinic Med: casirivimab\imdevimab infusion - Provided patient with casirivimab\imdevimab fact sheet for patients, parents and caregivers prior to infusion.  Complications: No immediate complications noted.  Discharge: Discharged home   Shirley Murphy 05/07/2020  

## 2020-05-07 NOTE — Discharge Instructions (Signed)

## 2020-10-14 IMAGING — US US ABDOMEN LIMITED
1 series · 14 of 25 positions shown · non-contrast
Comparison: None.

CLINICAL DATA: Hepatitis

EXAM:
ULTRASOUND ABDOMEN LIMITED RIGHT UPPER QUADRANT

[Series 1: us abdomen limited · 14 of 43 slices shown]
[im 1/43]
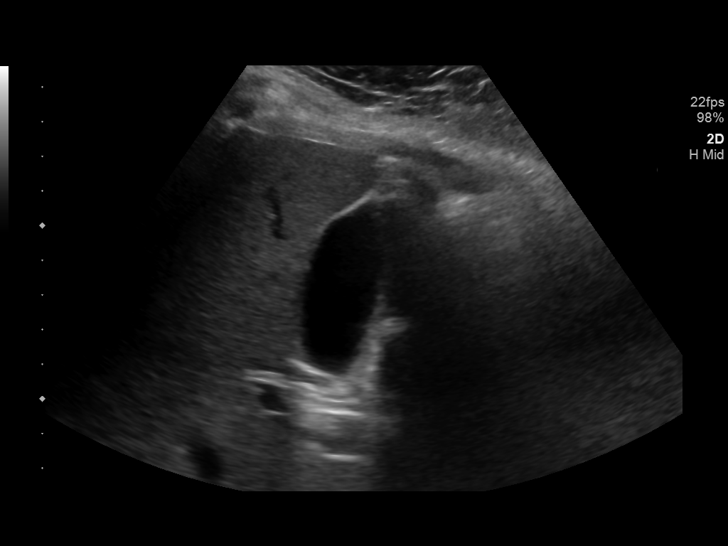
[im 4/43]
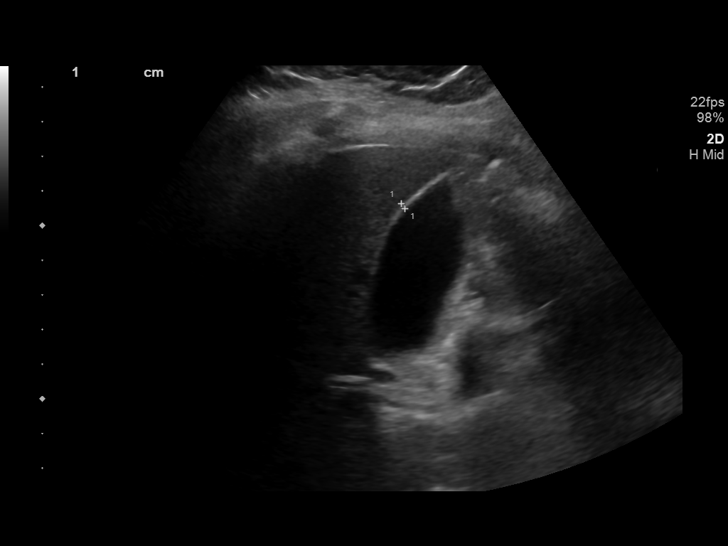
[im 8/43]
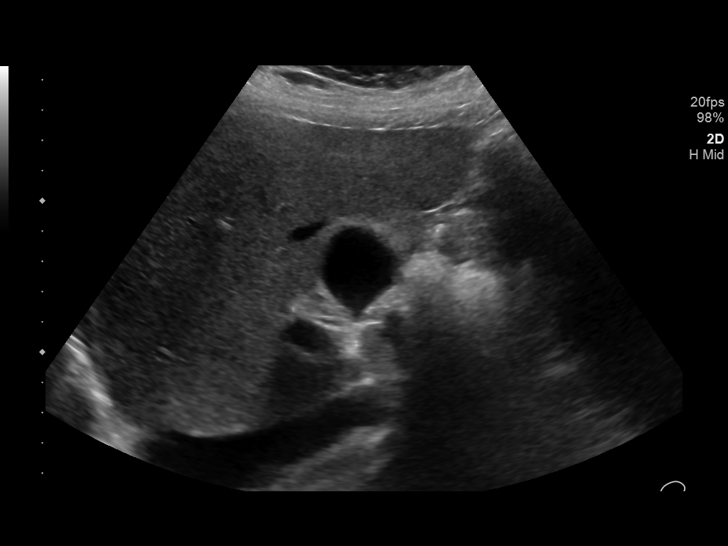
[im 11/43]
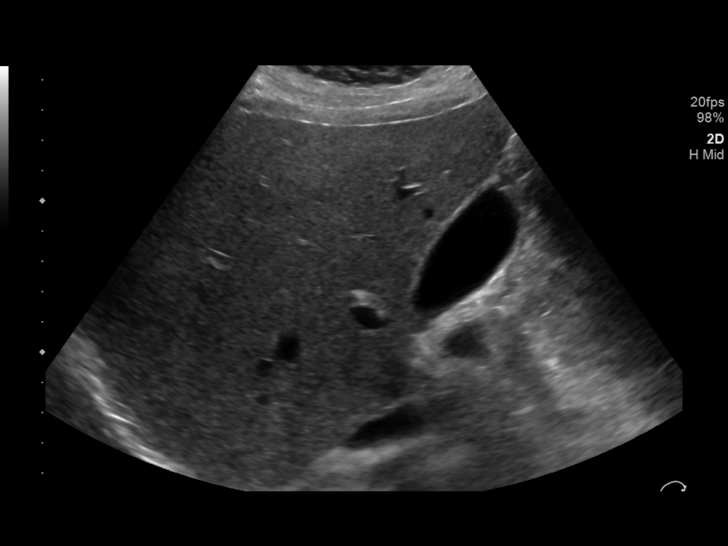
[im 15/43]
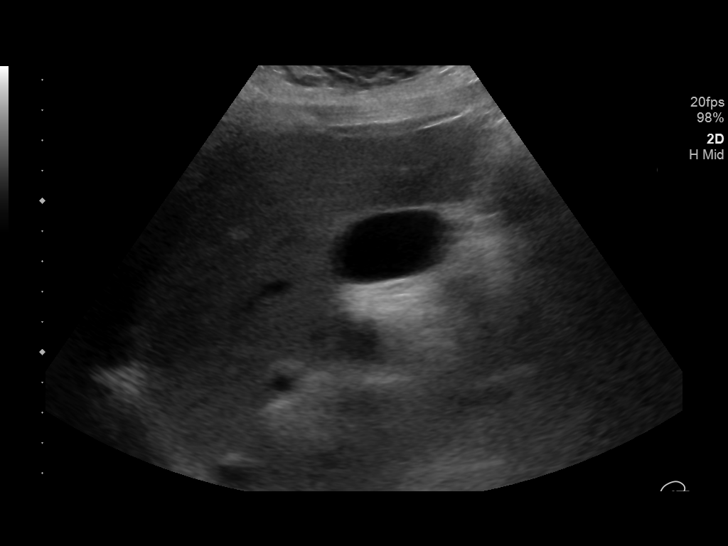
[im 16/43]
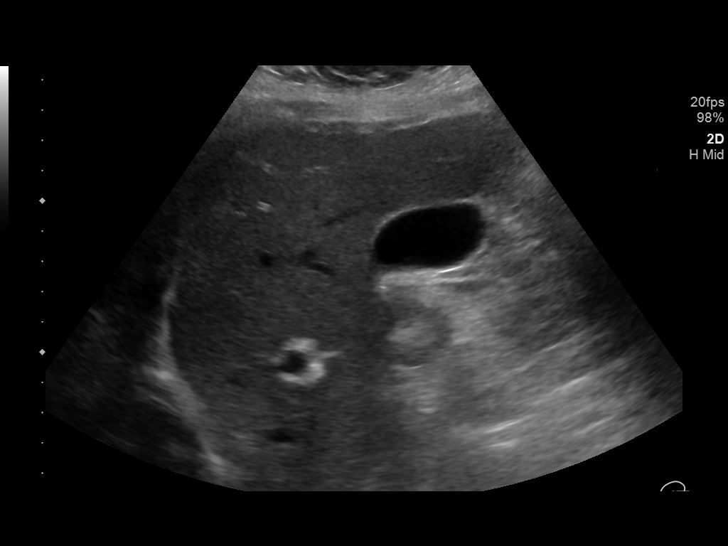
[im 20/43]
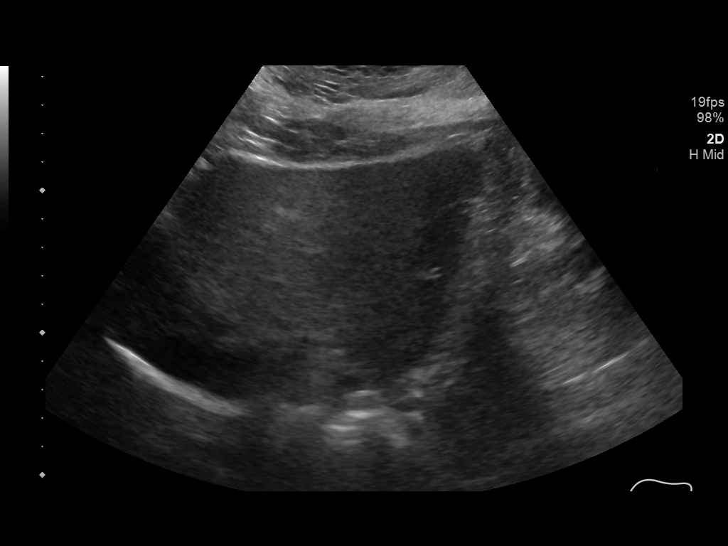
[im 23/43]
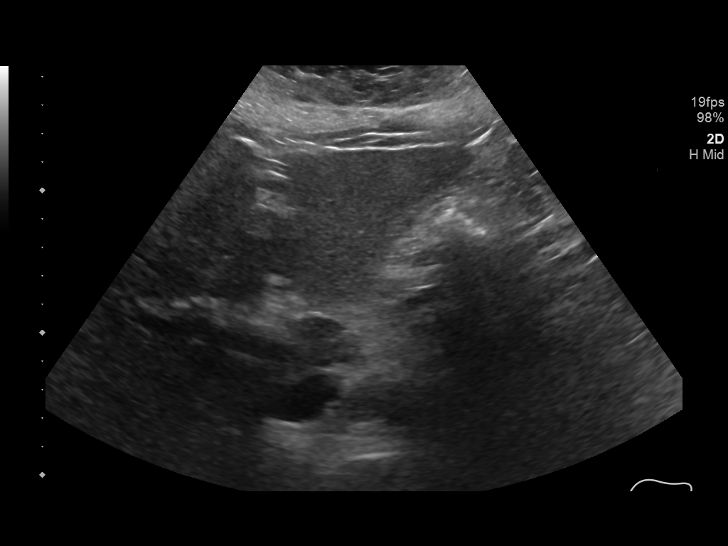
[im 27/43]
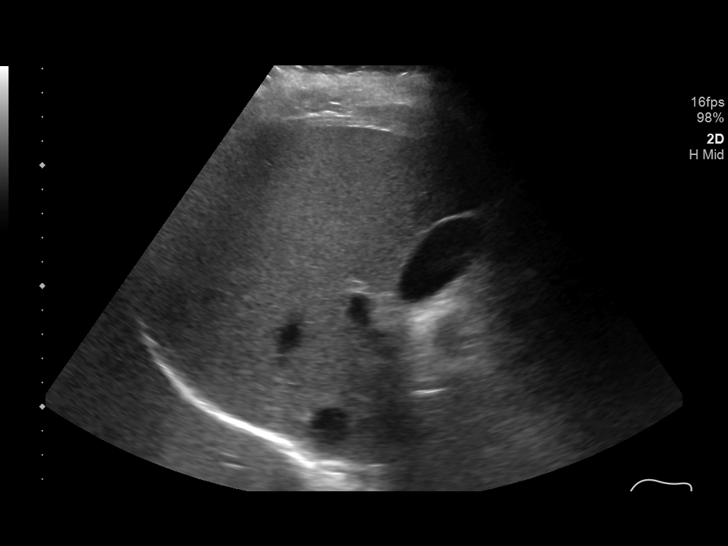
[im 29/43]
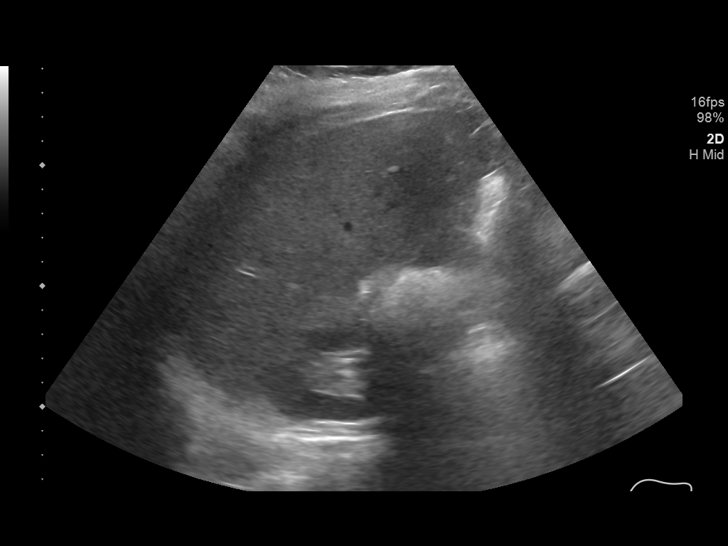
[im 32/43]
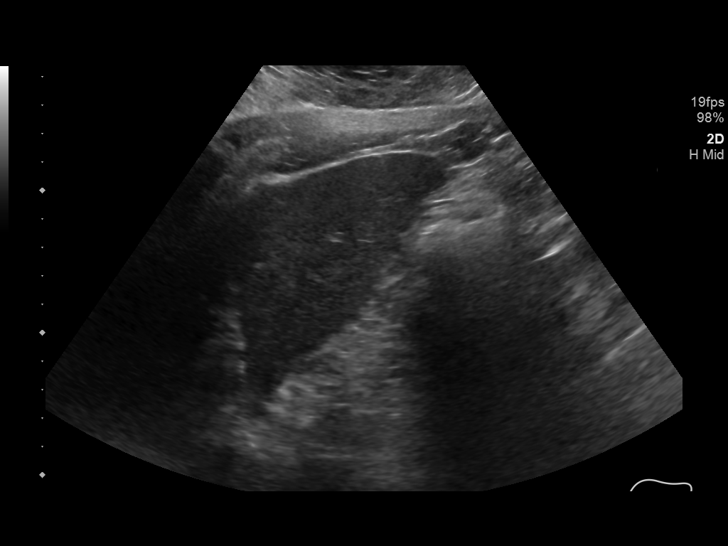
[im 36/43]
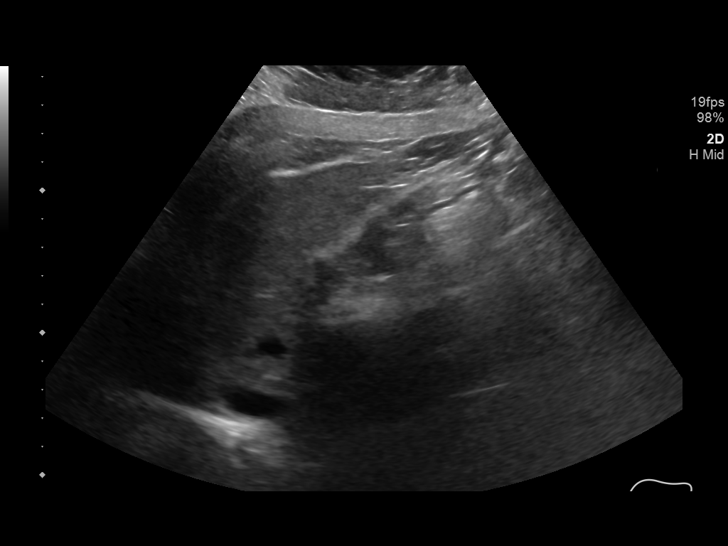
[im 39/43]
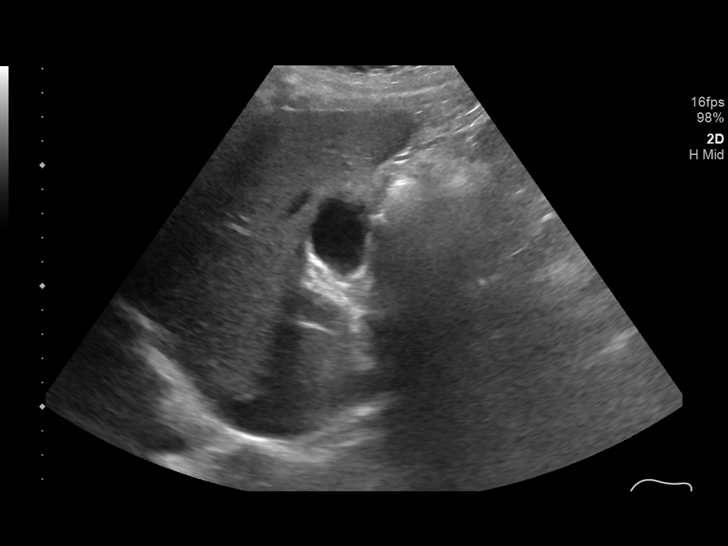
[im 43/43]
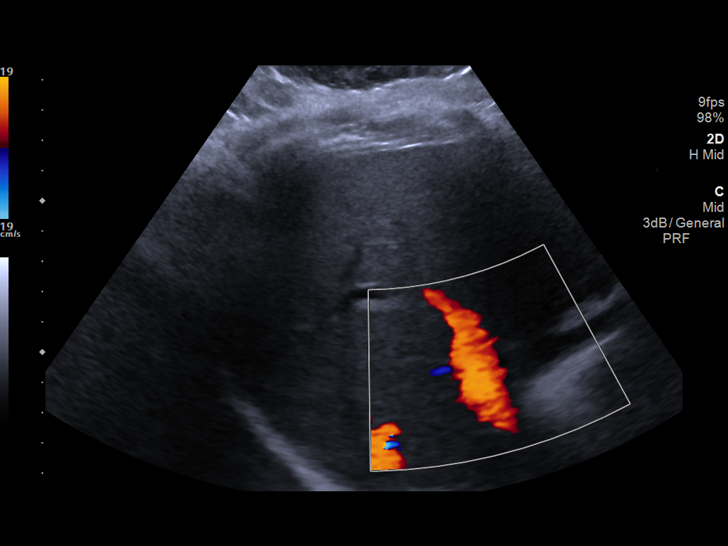

[14 of 25 positions shown; findings below may reference images not displayed]

FINDINGS: Gallbladder:

No gallstones or wall thickening visualized. No sonographic Murphy
sign noted by sonographer.

Common bile duct:

Diameter: 1.8 mm

Liver:

No focal lesion identified. Within normal limits in parenchymal
echogenicity. Portal vein is patent on color Doppler imaging with
normal direction of blood flow towards the liver.
IMPRESSION: No acute abnormality noted.

## 2021-09-06 IMAGING — US US ABDOMEN LIMITED
1 series · 14 of 25 positions shown · non-contrast
Comparison: Ultrasound 08/12/2018, CT abdomen pelvis 08/13/2018

CLINICAL DATA: Intermittent right upper quadrant pain for 1 week

EXAM:
ULTRASOUND ABDOMEN LIMITED RIGHT UPPER QUADRANT

[Series 1: us abdomen limited · 14 of 74 slices shown]
[im 1/74]
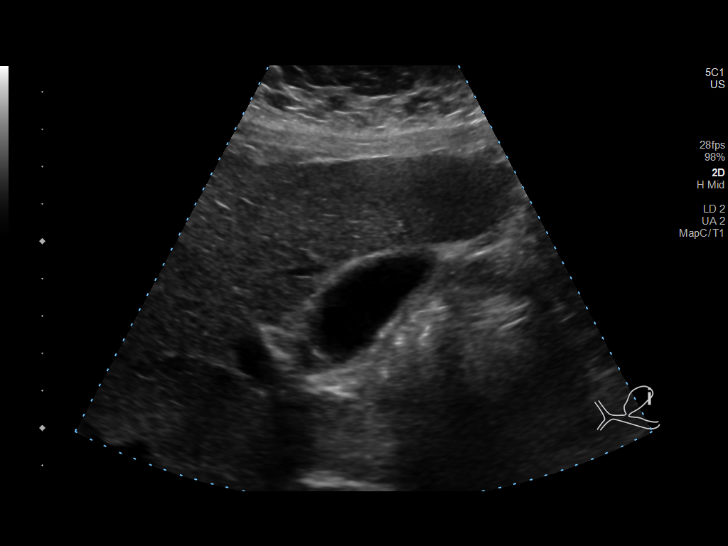
[im 7/74]
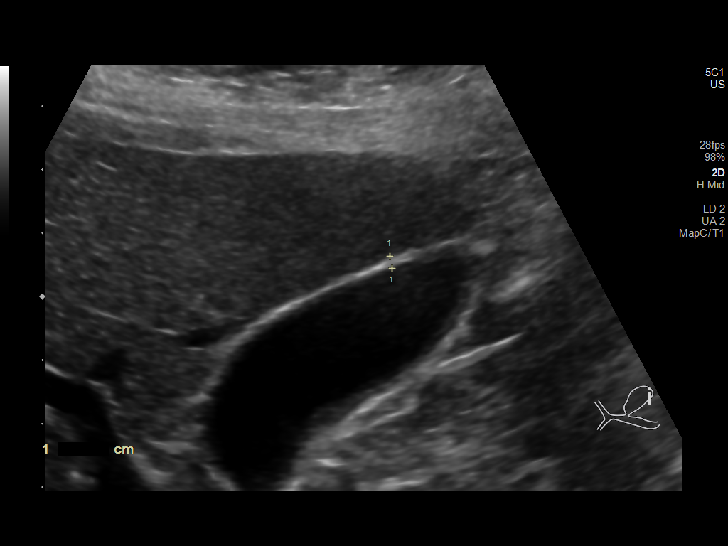
[im 13/74]
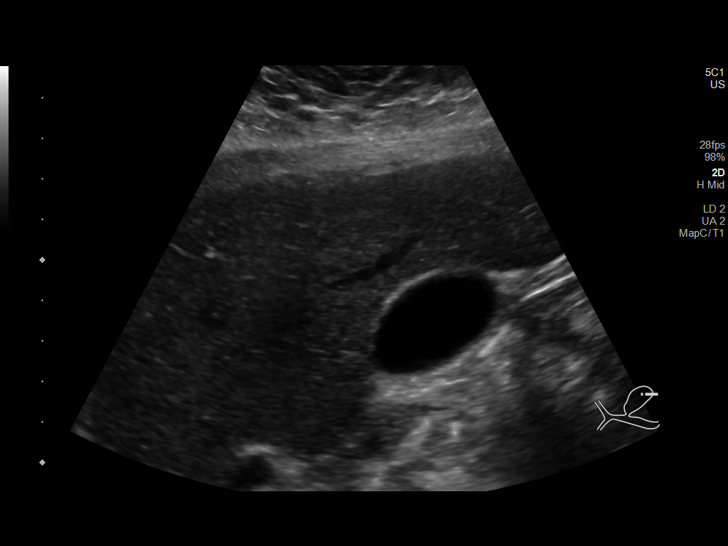
[im 19/74]
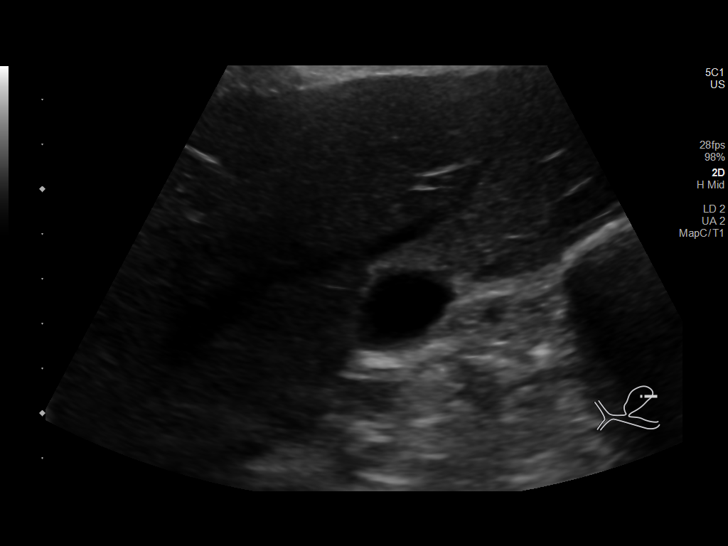
[im 25/74]
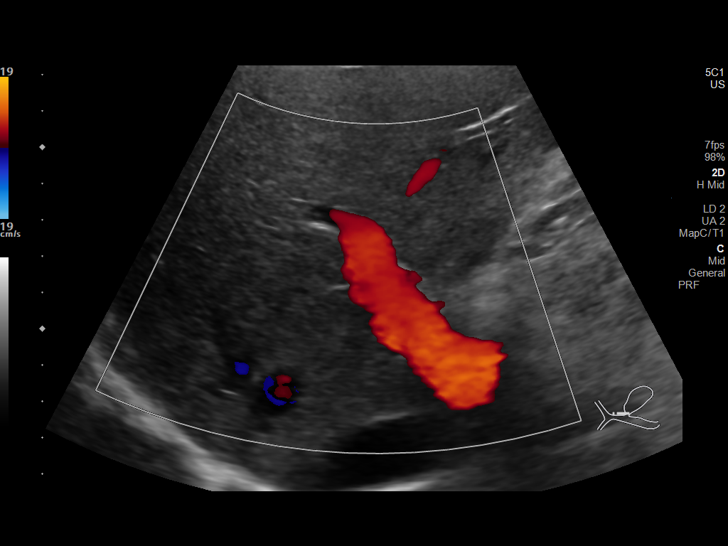
[im 28/74]
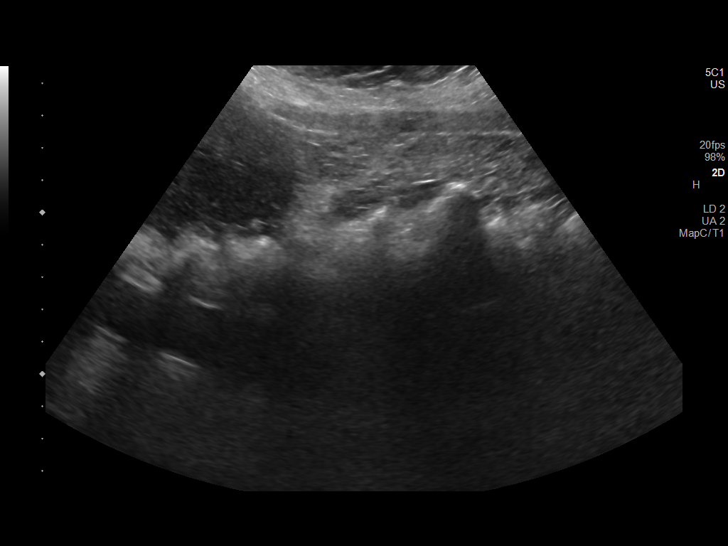
[im 34/74]
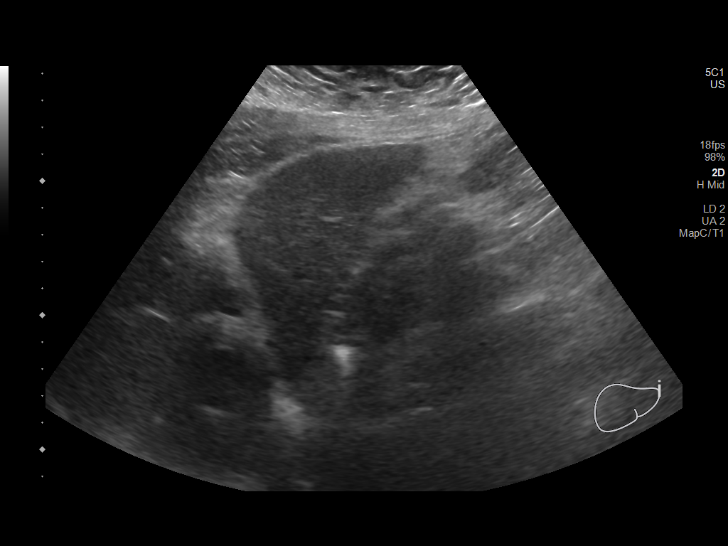
[im 40/74]
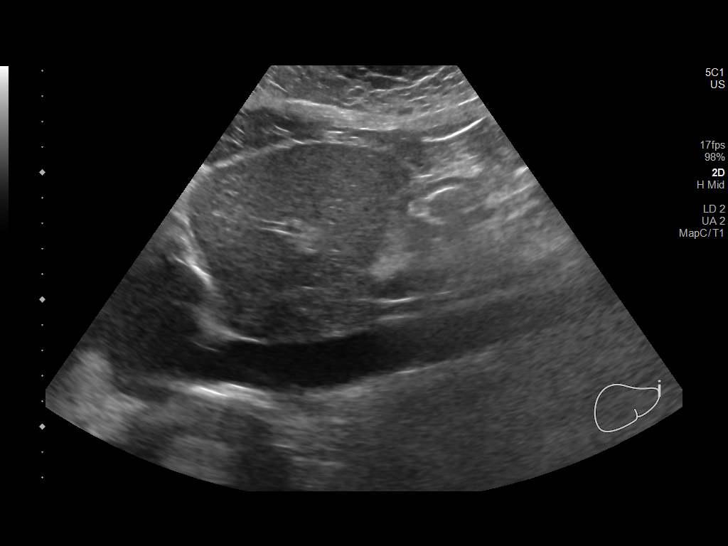
[im 46/74]
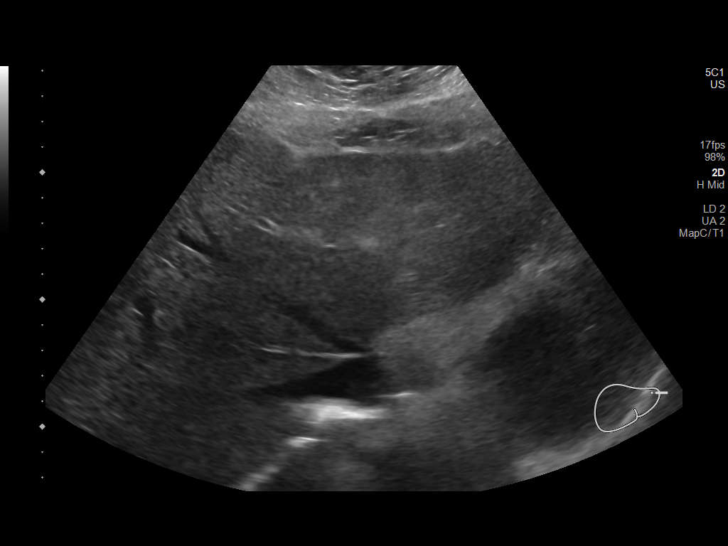
[im 49/74]
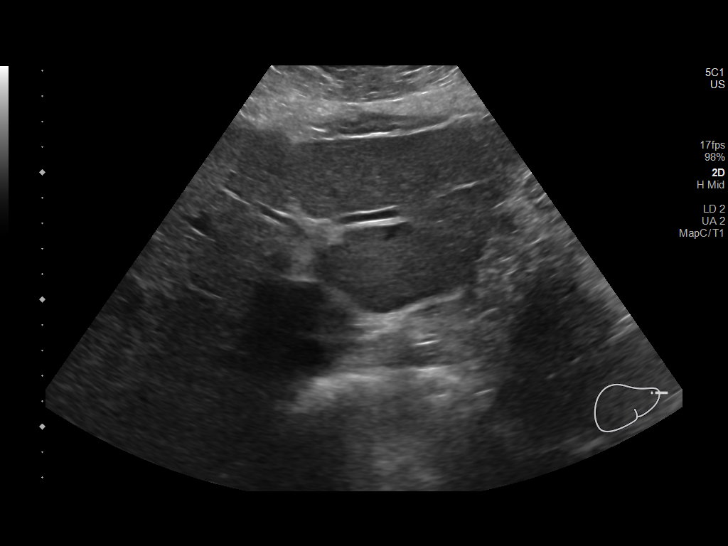
[im 55/74]
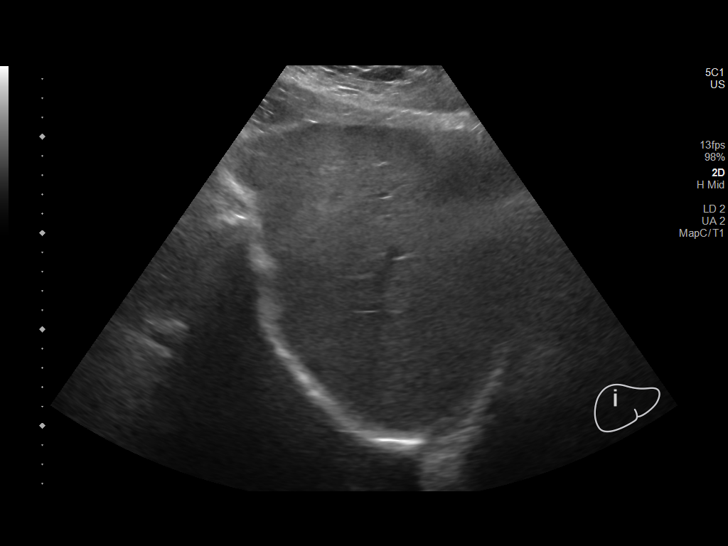
[im 61/74]
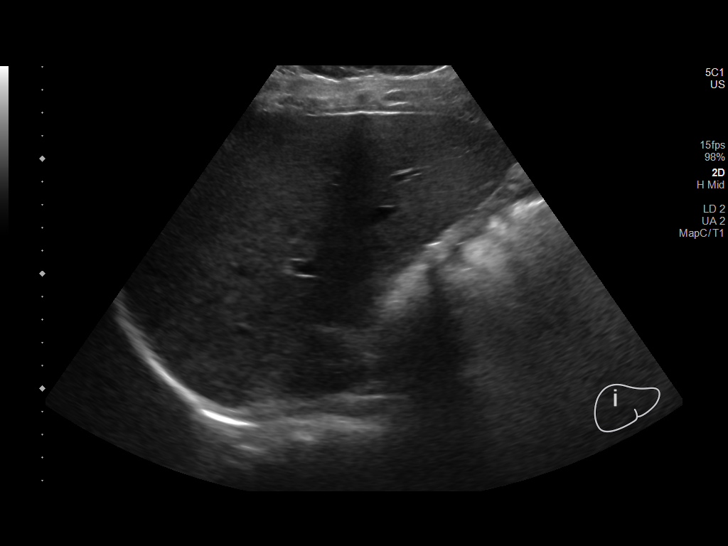
[im 67/74]
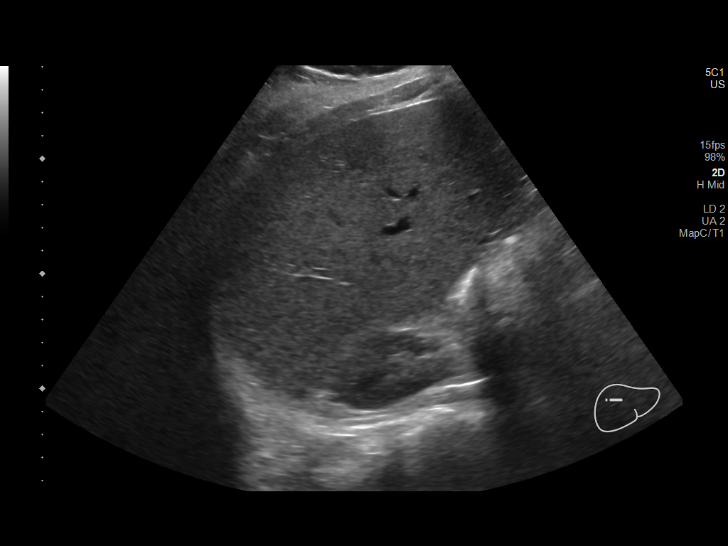
[im 74/74]
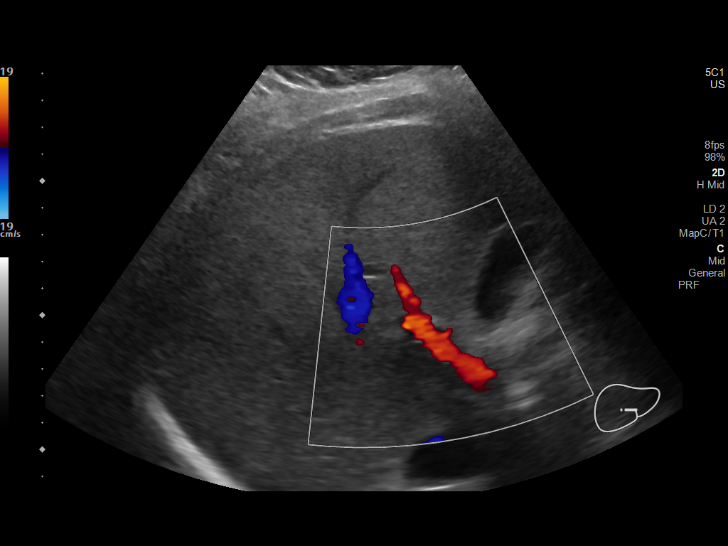

[14 of 25 positions shown; findings below may reference images not displayed]

FINDINGS: Gallbladder:

No gallstones or wall thickening visualized. No sonographic Murphy
sign noted by sonographer.

Common bile duct:

Diameter: 2.6 mm

Liver:

No focal lesion identified. Normal parenchymal echogenicity with
preservation of the defined portal triads. Portal vein is patent on
color Doppler imaging with normal direction of blood flow towards
the liver.

Other: Technically difficult exam due to patient motion artifact.
IMPRESSION: Unremarkable right upper quadrant ultrasound.
